# Patient Record
Sex: Male | Born: 1940
Health system: Southern US, Community
[De-identification: ages and names within clinical notes are randomized; demographics above are authoritative.]

## PROBLEM LIST (undated history)

## (undated) DIAGNOSIS — L570 Actinic keratosis: Secondary | ICD-10-CM

## (undated) DIAGNOSIS — D649 Anemia, unspecified: Secondary | ICD-10-CM

## (undated) DIAGNOSIS — C4491 Basal cell carcinoma of skin, unspecified: Secondary | ICD-10-CM

## (undated) DIAGNOSIS — K219 Gastro-esophageal reflux disease without esophagitis: Secondary | ICD-10-CM

## (undated) DIAGNOSIS — Z5189 Encounter for other specified aftercare: Secondary | ICD-10-CM

## (undated) DIAGNOSIS — Z972 Presence of dental prosthetic device (complete) (partial): Secondary | ICD-10-CM

## (undated) DIAGNOSIS — Z720 Tobacco use: Secondary | ICD-10-CM

## (undated) DIAGNOSIS — I739 Peripheral vascular disease, unspecified: Secondary | ICD-10-CM

## (undated) DIAGNOSIS — I10 Essential (primary) hypertension: Secondary | ICD-10-CM

## (undated) DIAGNOSIS — N529 Male erectile dysfunction, unspecified: Secondary | ICD-10-CM

## (undated) DIAGNOSIS — M1990 Unspecified osteoarthritis, unspecified site: Secondary | ICD-10-CM

## (undated) DIAGNOSIS — E512 Wernicke's encephalopathy: Secondary | ICD-10-CM

## (undated) DIAGNOSIS — D126 Benign neoplasm of colon, unspecified: Secondary | ICD-10-CM

## (undated) DIAGNOSIS — E785 Hyperlipidemia, unspecified: Secondary | ICD-10-CM

## (undated) DIAGNOSIS — C4401 Basal cell carcinoma of skin of lip: Secondary | ICD-10-CM

## (undated) HISTORY — DX: Male erectile dysfunction, unspecified: N52.9

## (undated) HISTORY — DX: Tobacco use: Z72.0

## (undated) HISTORY — PX: ANKLE ARTHROSCOPY W/ OPEN REPAIR: SHX1145

## (undated) HISTORY — DX: Hyperlipidemia, unspecified: E78.5

## (undated) HISTORY — DX: Actinic keratosis: L57.0

## (undated) HISTORY — DX: Benign neoplasm of colon, unspecified: D12.6

## (undated) HISTORY — PX: SKIN CANCER EXCISION: SHX779

## (undated) HISTORY — DX: Anemia, unspecified: D64.9

## (undated) HISTORY — DX: Essential (primary) hypertension: I10

## (undated) HISTORY — PX: TONSILLECTOMY: SUR1361

## (undated) HISTORY — DX: Unspecified osteoarthritis, unspecified site: M19.90

## (undated) HISTORY — DX: Basal cell carcinoma of skin of lip: C44.01

## (undated) HISTORY — DX: Basal cell carcinoma of skin, unspecified: C44.91

---

## 2003-12-28 DIAGNOSIS — D126 Benign neoplasm of colon, unspecified: Secondary | ICD-10-CM

## 2003-12-28 HISTORY — DX: Benign neoplasm of colon, unspecified: D12.6

## 2004-12-27 HISTORY — PX: COLONOSCOPY: SHX174

## 2005-09-06 ENCOUNTER — Encounter: Payer: Self-pay | Admitting: Family Medicine

## 2005-12-27 DIAGNOSIS — C4401 Basal cell carcinoma of skin of lip: Secondary | ICD-10-CM

## 2005-12-27 HISTORY — DX: Basal cell carcinoma of skin of lip: C44.01

## 2006-12-05 ENCOUNTER — Encounter: Payer: Self-pay | Admitting: Family Medicine

## 2007-07-17 ENCOUNTER — Encounter: Payer: Self-pay | Admitting: Family Medicine

## 2008-08-27 DIAGNOSIS — I10 Essential (primary) hypertension: Secondary | ICD-10-CM | POA: Insufficient documentation

## 2008-08-27 DIAGNOSIS — E785 Hyperlipidemia, unspecified: Secondary | ICD-10-CM

## 2008-08-27 DIAGNOSIS — E782 Mixed hyperlipidemia: Secondary | ICD-10-CM | POA: Insufficient documentation

## 2008-08-28 ENCOUNTER — Telehealth: Payer: Self-pay | Admitting: Family Medicine

## 2008-08-28 ENCOUNTER — Ambulatory Visit: Payer: Self-pay | Admitting: Family Medicine

## 2008-08-28 DIAGNOSIS — I6529 Occlusion and stenosis of unspecified carotid artery: Secondary | ICD-10-CM | POA: Insufficient documentation

## 2008-08-28 DIAGNOSIS — R7309 Other abnormal glucose: Secondary | ICD-10-CM | POA: Insufficient documentation

## 2008-08-28 DIAGNOSIS — L989 Disorder of the skin and subcutaneous tissue, unspecified: Secondary | ICD-10-CM | POA: Insufficient documentation

## 2008-08-28 DIAGNOSIS — G4700 Insomnia, unspecified: Secondary | ICD-10-CM

## 2008-08-29 LAB — CONVERTED CEMR LAB
CO2: 22 meq/L (ref 19–32)
Calcium: 8.9 mg/dL (ref 8.4–10.5)
Chloride: 108 meq/L (ref 96–112)
Cholesterol: 150 mg/dL (ref 0–200)
Creatinine, Ser: 0.8 mg/dL (ref 0.4–1.5)
Glucose, Bld: 123 mg/dL — ABNORMAL HIGH (ref 70–99)
HDL: 61.7 mg/dL (ref >39.0)
PSA: 0.98 ng/mL (ref 0.10–4.00)
Total CHOL/HDL Ratio: 2.4
Triglycerides: 54 mg/dL (ref 0–149)

## 2008-09-03 ENCOUNTER — Encounter: Payer: Self-pay | Admitting: Family Medicine

## 2008-09-03 DIAGNOSIS — C4491 Basal cell carcinoma of skin, unspecified: Secondary | ICD-10-CM

## 2008-09-03 DIAGNOSIS — C4490 Unspecified malignant neoplasm of skin, unspecified: Secondary | ICD-10-CM | POA: Insufficient documentation

## 2008-09-03 HISTORY — DX: Basal cell carcinoma of skin, unspecified: C44.91

## 2008-09-11 ENCOUNTER — Ambulatory Visit: Payer: Self-pay

## 2008-09-11 ENCOUNTER — Encounter: Payer: Self-pay | Admitting: Family Medicine

## 2008-11-06 ENCOUNTER — Encounter: Payer: Self-pay | Admitting: Family Medicine

## 2008-11-06 DIAGNOSIS — C4491 Basal cell carcinoma of skin, unspecified: Secondary | ICD-10-CM

## 2008-11-06 HISTORY — DX: Basal cell carcinoma of skin, unspecified: C44.91

## 2009-02-28 ENCOUNTER — Telehealth: Payer: Self-pay | Admitting: Family Medicine

## 2009-03-03 ENCOUNTER — Encounter: Payer: Self-pay | Admitting: Family Medicine

## 2009-03-25 ENCOUNTER — Ambulatory Visit: Payer: Self-pay

## 2009-03-25 ENCOUNTER — Encounter: Payer: Self-pay | Admitting: Family Medicine

## 2009-04-18 ENCOUNTER — Telehealth: Payer: Self-pay | Admitting: Family Medicine

## 2009-07-10 ENCOUNTER — Telehealth: Payer: Self-pay | Admitting: Family Medicine

## 2009-07-29 ENCOUNTER — Telehealth: Payer: Self-pay | Admitting: Family Medicine

## 2009-08-04 ENCOUNTER — Telehealth: Payer: Self-pay | Admitting: Family Medicine

## 2009-09-30 ENCOUNTER — Encounter: Payer: Self-pay | Admitting: Family Medicine

## 2009-09-30 ENCOUNTER — Ambulatory Visit: Payer: Self-pay

## 2009-10-08 ENCOUNTER — Ambulatory Visit: Payer: Self-pay | Admitting: Family Medicine

## 2009-10-13 LAB — CONVERTED CEMR LAB
Albumin: 4 g/dL (ref 3.5–5.2)
BUN: 20 mg/dL (ref 6–23)
Basophils Absolute: 0.1 10*3/uL (ref 0.0–0.1)
Calcium: 8.9 mg/dL (ref 8.4–10.5)
Eosinophils Relative: 8.9 % — ABNORMAL HIGH (ref 0.0–5.0)
Ferritin: 5.9 ng/mL — ABNORMAL LOW (ref 22.0–322.0)
Folate: 17 ng/mL
GFR calc non Af Amer: 88.91 mL/min (ref >60)
Glucose, Bld: 123 mg/dL — ABNORMAL HIGH (ref 70–99)
HDL: 57.9 mg/dL (ref >39.00)
Hemoglobin: 9.5 g/dL — ABNORMAL LOW (ref 13.0–17.0)
LDL Cholesterol: 84 mg/dL (ref 0–99)
Lymphocytes Relative: 23 % (ref 12.0–46.0)
Monocytes Relative: 13 % — ABNORMAL HIGH (ref 3.0–12.0)
PSA: 1.33 ng/mL (ref 0.10–4.00)
Platelets: 251 10*3/uL (ref 150.0–400.0)
RDW: 19.8 % — ABNORMAL HIGH (ref 11.5–14.6)
Sodium: 137 meq/L (ref 135–145)
VLDL: 11.8 mg/dL (ref 0.0–40.0)
Vitamin B-12: >1500 pg/mL — ABNORMAL HIGH (ref 211–911)
WBC: 5.8 10*3/uL (ref 4.5–10.5)

## 2009-10-22 ENCOUNTER — Encounter (INDEPENDENT_AMBULATORY_CARE_PROVIDER_SITE_OTHER): Payer: Self-pay | Admitting: *Deleted

## 2009-11-10 ENCOUNTER — Ambulatory Visit: Payer: Self-pay | Admitting: Family Medicine

## 2009-11-12 DIAGNOSIS — R195 Other fecal abnormalities: Secondary | ICD-10-CM

## 2009-11-12 DIAGNOSIS — D649 Anemia, unspecified: Secondary | ICD-10-CM

## 2009-12-12 ENCOUNTER — Ambulatory Visit: Payer: Self-pay | Admitting: Gastroenterology

## 2009-12-15 ENCOUNTER — Encounter: Payer: Self-pay | Admitting: Gastroenterology

## 2009-12-15 LAB — CONVERTED CEMR LAB
Basophils Absolute: 0 10*3/uL (ref 0.0–0.1)
Eosinophils Absolute: 0.3 10*3/uL (ref 0.0–0.7)
HCT: 46.3 % (ref 39.0–52.0)
Hemoglobin: 15.2 g/dL (ref 13.0–17.0)
IgA: 150 mg/dL (ref 68–378)
Lymphs Abs: 1.6 10*3/uL (ref 0.7–4.0)
MCHC: 32.8 g/dL (ref 30.0–36.0)
Monocytes Relative: 11.2 % (ref 3.0–12.0)
Neutro Abs: 5.5 10*3/uL (ref 1.4–7.7)
RDW: 28.1 % — ABNORMAL HIGH (ref 11.5–14.6)

## 2009-12-27 HISTORY — PX: UPPER GI ENDOSCOPY: SHX6162

## 2010-01-09 ENCOUNTER — Ambulatory Visit: Payer: Self-pay | Admitting: Gastroenterology

## 2010-01-13 ENCOUNTER — Encounter: Payer: Self-pay | Admitting: Gastroenterology

## 2010-01-20 ENCOUNTER — Telehealth: Payer: Self-pay | Admitting: Family Medicine

## 2010-01-29 ENCOUNTER — Telehealth: Payer: Self-pay | Admitting: Gastroenterology

## 2010-03-09 ENCOUNTER — Encounter (INDEPENDENT_AMBULATORY_CARE_PROVIDER_SITE_OTHER): Payer: Self-pay | Admitting: *Deleted

## 2010-04-21 ENCOUNTER — Ambulatory Visit: Payer: Self-pay | Admitting: Gastroenterology

## 2010-04-21 LAB — CONVERTED CEMR LAB
Basophils Relative: 0.8 % (ref 0.0–3.0)
Eosinophils Absolute: 0.3 10*3/uL (ref 0.0–0.7)
Eosinophils Relative: 4.4 % (ref 0.0–5.0)
Hemoglobin: 15.6 g/dL (ref 13.0–17.0)
Lymphocytes Relative: 21.6 % (ref 12.0–46.0)
MCHC: 34.7 g/dL (ref 30.0–36.0)
Neutro Abs: 4.4 10*3/uL (ref 1.4–7.7)
RBC: 4.74 M/uL (ref 4.22–5.81)

## 2010-04-21 LAB — HM COLONOSCOPY

## 2010-07-22 ENCOUNTER — Encounter: Payer: Self-pay | Admitting: Gastroenterology

## 2010-07-31 ENCOUNTER — Encounter: Payer: Self-pay | Admitting: Gastroenterology

## 2010-09-14 ENCOUNTER — Telehealth: Payer: Self-pay | Admitting: Family Medicine

## 2010-12-31 ENCOUNTER — Telehealth (INDEPENDENT_AMBULATORY_CARE_PROVIDER_SITE_OTHER): Payer: Self-pay | Admitting: *Deleted

## 2011-01-18 ENCOUNTER — Other Ambulatory Visit: Payer: Self-pay | Admitting: Family Medicine

## 2011-01-18 ENCOUNTER — Ambulatory Visit
Admission: RE | Admit: 2011-01-18 | Discharge: 2011-01-18 | Payer: Self-pay | Source: Home / Self Care | Attending: Family Medicine | Admitting: Family Medicine

## 2011-01-18 DIAGNOSIS — R5381 Other malaise: Secondary | ICD-10-CM | POA: Insufficient documentation

## 2011-01-18 DIAGNOSIS — R5383 Other fatigue: Secondary | ICD-10-CM

## 2011-01-18 LAB — CBC WITH DIFFERENTIAL/PLATELET
Basophils Relative: 0.6 % (ref 0.0–3.0)
Eosinophils Absolute: 0.2 10*3/uL (ref 0.0–0.7)
Eosinophils Relative: 3.6 % (ref 0.0–5.0)
Hemoglobin: 15 g/dL (ref 13.0–17.0)
Lymphocytes Relative: 22.7 % (ref 12.0–46.0)
MCHC: 34.4 g/dL (ref 30.0–36.0)
Monocytes Relative: 11.2 % (ref 3.0–12.0)
Neutro Abs: 3.7 10*3/uL (ref 1.4–7.7)
Neutrophils Relative %: 61.9 % (ref 43.0–77.0)
RBC: 4.6 Mil/uL (ref 4.22–5.81)
WBC: 6 10*3/uL (ref 4.5–10.5)

## 2011-01-18 LAB — BASIC METABOLIC PANEL
CO2: 26 mEq/L (ref 19–32)
Calcium: 9.3 mg/dL (ref 8.4–10.5)
GFR: 90.9 mL/min (ref >60.00)
Glucose, Bld: 129 mg/dL — ABNORMAL HIGH (ref 70–99)
Potassium: 4.8 mEq/L (ref 3.5–5.1)
Sodium: 137 mEq/L (ref 135–145)

## 2011-01-18 LAB — HEPATIC FUNCTION PANEL
AST: 68 U/L — ABNORMAL HIGH (ref 0–37)
Albumin: 4.1 g/dL (ref 3.5–5.2)
Alkaline Phosphatase: 60 U/L (ref 39–117)
Bilirubin, Direct: 0.1 mg/dL (ref 0.0–0.3)

## 2011-01-18 LAB — PSA: PSA: 1.1 ng/mL (ref 0.10–4.00)

## 2011-01-18 LAB — LDL CHOLESTEROL, DIRECT: Direct LDL: 132 mg/dL

## 2011-01-18 LAB — FERRITIN: Ferritin: 55.3 ng/mL (ref 22.0–322.0)

## 2011-01-18 LAB — LIPID PANEL
HDL: 62.6 mg/dL (ref >39.00)
VLDL: 16.2 mg/dL (ref 0.0–40.0)

## 2011-01-21 ENCOUNTER — Ambulatory Visit
Admission: RE | Admit: 2011-01-21 | Discharge: 2011-01-21 | Payer: Self-pay | Source: Home / Self Care | Attending: Family Medicine | Admitting: Family Medicine

## 2011-01-21 DIAGNOSIS — F1010 Alcohol abuse, uncomplicated: Secondary | ICD-10-CM | POA: Insufficient documentation

## 2011-01-21 DIAGNOSIS — IMO0002 Reserved for concepts with insufficient information to code with codable children: Secondary | ICD-10-CM | POA: Insufficient documentation

## 2011-01-21 DIAGNOSIS — R740 Nonspecific elevation of levels of transaminase and lactic acid dehydrogenase [LDH]: Secondary | ICD-10-CM

## 2011-01-26 NOTE — Procedures (Signed)
Summary: Recall Assessment/[facility]  Recall Assessment/[facility]   Imported By: Sherian Rein 08/04/2010 11:17:08  _____________________________________________________________________  External Attachment:    Type:   Image     Comment:   External Document

## 2011-01-26 NOTE — Letter (Signed)
Summary: Results Letter  [facility]  [address] [place], [place] [address]   Phone: [phone]  Fax: [phone]        January 13, 2010 MRN: [id number]    [facility] [address] [place], [place]  [address]    Dear Mr. Schatzman,   The biopsies taken during your recent upper endoscopy showed no signs of infection or cancer.  You should continue to follow the recommnedations that we discussed at the time of your procedure.   Please feel free to call if you have any further questions or concerns.       Sincerely,  Rachael Fee MD  This letter has been electronically signed by your physician.  Appended Document: Results Letter Letter mailed 1.18.11

## 2011-01-26 NOTE — Progress Notes (Signed)
Summary: Rx Zolpidem  Phone Note Refill Request Call back at [phone] Message from:  [facility] [facility] on January 20, 2010 2:33 PM  Refills Requested: Medication #1:  ZOLPIDEM TARTRATE 10 MG TABS 1 by mouth at bedtime as needed for sleep   Last Refilled: 10/06/2009 Received faxed refill request, please advise   Method Requested: Telephone to Pharmacy Initial call taken by: Linde Gillis CMA Duncan Dull),  January 20, 2010 2:34 PM    Prescriptions: ZOLPIDEM TARTRATE 10 MG TABS (ZOLPIDEM TARTRATE) 1 by mouth at bedtime as needed for sleep  #30 x 1   Entered and Authorized by:   Hannah Beat MD   Signed by:   Hannah Beat MD on 01/20/2010   Method used:   Telephoned to ...       [facility]  [place]ail)       [address]       [place], [place]  [address]       Ph: [phone] or [phone]       Fax: [phone]   RxID:   [id number]

## 2011-01-26 NOTE — Progress Notes (Signed)
Summary: Remus Loffler  Phone Note Refill Request Call back at [facility] Phone [phone] Message from:  [employer] on September 14, 2010 8:49 AM  Refills Requested: Medication #1:  ZOLPIDEM TARTRATE 10 MG TABS 1 by mouth at bedtime as needed for sleep   Supply Requested: 1 month   [facility] [place] [phone]   Method Requested: Telephone to Pharmacy Initial call taken by: Benny Lennert CMA Duncan Dull),  September 14, 2010 8:49 AM  Follow-up for Phone Call        Rx called to pharmacy Follow-up by: Benny Lennert CMA Duncan Dull),  September 14, 2010 3:25 PM    Prescriptions: ZOLPIDEM TARTRATE 10 MG TABS (ZOLPIDEM TARTRATE) 1 by mouth at bedtime as needed for sleep  #30 x 1   Entered and Authorized by:   Hannah Beat MD   Signed by:   Hannah Beat MD on 09/14/2010   Method used:   Telephoned to ...       [facility]  [place]ail[facility]       [address]       [place], [place]  [address]       Ph: [phone] or [phone]       Fax: [phone]   RxID:   [id number]

## 2011-01-26 NOTE — Progress Notes (Signed)
Summary: Triage  Phone Note Call from Patient Call back at Home Phone [phone]   Caller: Patient Call For: Dr. Christella Hartigan Reason for Call: Talk to Nurse Summary of Call: Pt is on Prilosec and has been taking it for 19 days. The box said to only take it for 14 days. Medication seems to be working. Will this be an issue? Initial call taken by: Karna Christmas,  January 29, 2010 10:50 AM  Follow-up for Phone Call        I explained to the pt that Dr Christella Hartigan wanted him to remain on the Prilosec and it was ok for him to do so.  He states it is really h elping. he schedule a f/u in a few weeks he is going out of town in March and will be gone three weeks. Follow-up by: Chales Abrahams CMA (AAMA),  January 29, 2010 11:00 AM

## 2011-01-26 NOTE — Assessment & Plan Note (Signed)
  Review of gastrointestinal problems: 1. Likely NSAID related gastritis, EGD January 2011 by Dr. Christella Hartigan, biopsies were negative for H. pylori; duodenal biopsies neg for sprue changes.   2. tubular adenoma, colonoscopy in [place]   History of Present Illness Visit Type: Follow-up Visit Primary GI MD: Rob Bunting MD Primary Provider: Hannah Beat, MD Requesting Provider: n/a Chief Complaint: F/u from EGD History of Present Illness:     very pleasant 70 year old man whom I last saw January, 2011.  who has had no problems since then.  No black stools, no hematemesis.  No fatigue.  he stopped iron 3 months ago and has been on PPI daily.  No NSAIDs.             Current Medications (verified): 1)  Simvastatin 10 Mg Tabs (Simvastatin) .... Take One Tablet Daily 2)  Moexipril Hcl 15 Mg Tabs (Moexipril Hcl) .... Take One Tablet Daily 3)  Zolpidem Tartrate 10 Mg Tabs (Zolpidem Tartrate) .Marland Kitchen.. 1 By Mouth At Bedtime As Needed For Sleep 4)  Bupropion Hcl 150 Mg Xr24h-Tab (Bupropion Hcl) .Marland Kitchen.. 1 By Mouth Daily 5)  Prilosec Otc 20 Mg  Tbec (Omeprazole Magnesium) .Marland Kitchen.. 1 Each Day 30 Minutes Before Meal  Allergies (verified): No Known Drug Allergies  Vital Signs:  Patient profile:   70 year old male Height:      70 inches Weight:      190 pounds BMI:     27.36 BSA:     2.04 Pulse rate:   76 / minute Pulse rhythm:   regular BP sitting:   142 / 86  (left arm) Cuff size:   regular  Vitals Entered By: Ok Anis CMA (April 21, 2010 8:30 AM)  Physical Exam  Additional Exam:  Constitutional: generally well appearing Psychiatric: alert and oriented times 3 Abdomen: soft, non-tender, non-distended, normal bowel sounds    Impression & Recommendations:  Problem # 1:  previous gastritis he has had no recurrent dark stools. He stopped NSAIDs completely and although he was not taking very much I suspect they may have been contributing to his gastric irritation, gastritis. He will  continue taking over-the-counter Prilosec once daily. He'll continue to avoid NSAIDs. He had a CBC just prior to this visit and if he is not anemic but I don't think any further workup needs to be performed.  Problem # 2:  personal history of precancerous colon polyps he had a single tubular adenoma removed in [place], 2006. We will set him up for recall colonoscopy September, 2011.  Patient Instructions: 1)  Recall colonoscopy in September 2011 for previous colon polyps. 2)  Continue once daily PPI (prilosec, OTC). 3)  Dr. Christella Hartigan' office will contact you with blood test results from this AM. 4)  A copy of this information will be sent to Dr. Dallas Schimke. 5)  The medication list was reviewed and reconciled.  All changed / newly prescribed medications were explained.  A complete medication list was provided to the patient / caregiver.  Appended Document: recall colon    Clinical Lists Changes  Observations: Added new observation of COLONNXTDUE: 08/2010 (04/21/2010 8:51)

## 2011-01-26 NOTE — Procedures (Signed)
Summary: Upper Endoscopy  Patient: Devin Garza Note: All result statuses are Final unless otherwise noted.  Tests: (1) Upper Endoscopy (EGD)   EGD Upper Endoscopy       DONE      [facility]     [address]     [place], [place]  [address]           ENDOSCOPY PROCEDURE REPORT           PATIENT:  Devin Garza  MR#:  [id number]     BIRTHDATE:  09-15-1941, 68 yrs. old  GENDER:  male           ENDOSCOPIST:  Rachael Fee, MD     Referred by:  Elpidio Galea. Copland, M.D.           PROCEDURE DATE:  01/09/2010     PROCEDURE:  EGD with biopsy     ASA CLASS:  Class II     INDICATIONS:  heme positive anemia, intermittent black stools,     colonoscopy in 2006 (elsewhere) found 2 small polyps (one was a     TA), freqent NSAID taker           MEDICATIONS:  Fentanyl 50 mcg IV, Versed 7 mg IV     TOPICAL ANESTHETIC:  none           DESCRIPTION OF PROCEDURE:   After the risks benefits and     alternatives of the procedure were thoroughly explained, informed     consent was obtained.  The [facility] [id number] endoscope was     introduced through the mouth and advanced to the second portion of     the duodenum, without limitations.  The instrument was slowly     withdrawn as the mucosa was fully examined.     <<PROCEDUREIMAGES>>           There was mild to moderate, non-specific gastritis. This was     biopsied to check for H. pylori. There was mild duodenitis     (scalloping?), this was biopsied to check for celiac sprue given     iron def anemia even though serology was negative. There was mild     reflux esophagitis (see image1, image2, image3, image4, image5,     and image6).  A hiatal hernia was found. This was small.     Otherwise the examination was normal.    Retroflexed views revealed     no abnormalities.    The scope was then withdrawn from the patient     and the procedure completed.           COMPLICATIONS:  None           ENDOSCOPIC IMPRESSION:     1) Mild to  moderate gastritis (probably NSAID related but     biopsies were taken to check for H. pylori).  Mild duodenitis,     biopsied.  Mild reflux esophagitis.     2) Small hiatal hernia     3) Otherwise normal examination           RECOMMENDATIONS:     He can stop daily iron supplements which he started 2-3 months     ago after he was found to have Hb in 9s (Hb last month was 15).     He should continue to completely AVOID NSAID type medicines.     He should continue daily anti-acid medicine (prilosec OTC is     reasonable, 20-30 min  prior to breakfast meal daily).     My office will arrange for return visit in 7-8 weeks and he will     have CBC the day prior.           ______________________________     Rachael Fee, MD           n.     eSIGNED:   Rachael Fee at 01/09/2010 10:47 AM           [name], [id number]  Note: An exclamation mark (!) indicates a result that was not dispersed into the flowsheet. Document Creation Date: 01/09/2010 10:47 AM _______________________________________________________________________  (1) Order result status: Final Collection or observation date-time: 01/09/2010 10:38 Requested date-time:  Receipt date-time:  Reported date-time:  Referring Physician:   Ordering Physician: Rob Bunting [phone]) Specimen Source:  Source: Launa Grill Order Number: [id number] Lab site:

## 2011-01-26 NOTE — Letter (Signed)
Summary: Colonoscopy Letter  [facility]  [address] [place], [place] [address]   Phone: [phone]  Fax: [phone]      July 31, 2010 MRN: [id number]   [facility] [address] [place], [place]  [address]   Dear Mr. Yaun,   According to your medical record, it is time for you to schedule a Colonoscopy. The American Cancer Society recommends this procedure as a method to detect early colon cancer. Patients with a family history of colon cancer, or a personal history of colon polyps or inflammatory bowel disease are at increased risk.  This letter has been generated based on the recommendations made at the time of your procedure. If you feel that in your particular situation this may no longer apply, please contact our office.  Please call our office at [phone] to schedule this appointment or to update your records at your earliest convenience.  Thank you for cooperating with [place] to provide you with the very best care possible.   Sincerely,  Rachael Fee, M.D.  [facility] [phone]

## 2011-01-26 NOTE — Letter (Signed)
Summary: Appt Reminder 2  [facility]  [address] [place], [place] [address]   Phone: [phone]  Fax: [phone]        March 09, 2010 MRN: [id number]    [facility] [address] [place], [place]  [address]    Dear Devin Garza,   You have a return appointment with Dr. Christella Hartigan on 03/30/10 at 3:45pm. Please go to our basement lab 1 hour prior to your visit.  Please remember to bring a complete list of the medicines you are taking, your insurance card and your co-pay.  If you have to cancel or reschedule this appointment, please call before 5:00 pm the evening before to avoid a cancellation fee.  If you have any questions or concerns, please call [phone].    Sincerely,    Chales Abrahams CMA (AAMA)  Appended Document: Appt Reminder 2 letter mailed

## 2011-01-28 NOTE — Assessment & Plan Note (Addendum)
Summary: CPX /JRR   Vital Signs:  Patient profile:   70 year old male Height:      70 inches Weight:      197.75 pounds BMI:     28.48 Temp:     99.3 degrees F oral Pulse rate:   76 / minute Pulse rhythm:   regular BP sitting:   160 / 88  (left arm) Cuff size:   regular  Vitals Entered By: Benny Lennert CMA Duncan Dull) (January 21, 2011 2:39 PM)  History of Present Illness: Chief complaint annual wellness visit  Pneumovax done.  Colonoscopy: The patient understands that he is due for colonoscopy, he has had a recall letter from gastroenterology, he's been to do this when he comes in back from town.Marland Kitchen He is got an upcoming trip to [place], and will be back in March. Zostavaxcold the patient understands this is the recommendation from the [facility], he will check on the pavement based on his insurance.  Elevated LFT's:transaminases are elevated, with a ratio of higher AST compared area ALT. Patient does drink alcohol minimally 3 drinks a day, sometimes upwards of 4-6 drinks a day. Over the last several weeks, patient has  diminished this intake.  Elevated blood sugar / DM 2: fasting blood sugar of 129, previously no diagnosis of diabetes.  Again, the patient has been drinking an excessive amount of alcohol over the last several months, and  additionally he has been eating quite poorly and drinking at least a liter of Sprite a day.  EAV:[id number].  Repeated in the office by me and again 155/90. He reports compliance with his medications. Alcohol impact noted and discussed.  Carotid artery stenosis - needs f/u scan  Clinical Review Panels:  Prevention   Last Colonoscopy:  Adenomatous Polyp, Hyperplastic Polyp (09/06/2005)   Last PSA:  1.10 (01/18/2011)  Immunizations   Last Tetanus Booster:  given (12/28/2003)   Last Pneumovax:  Pneumovax (Medicare) (01/21/2011)  Lipid Management   Cholesterol:  203 (01/18/2011)   LDL (bad choesterol):  84 (10/08/2009)   HDL (good cholesterol):  62.60  (01/18/2011)  Diabetes Management   Creatinine:  0.9 (01/18/2011)   Last Pneumovax:  Pneumovax (Medicare) (01/21/2011)  CBC   WBC:  6.0 (01/18/2011)   RBC:  4.60 (01/18/2011)   Hgb:  15.0 (01/18/2011)   Hct:  43.7 (01/18/2011)   Platelets:  203.0 (01/18/2011)   MCV  94.9 (01/18/2011)   MCHC  34.4 (01/18/2011)   RDW  14.1 (01/18/2011)   PMN:  61.9 (01/18/2011)   Lymphs:  22.7 (01/18/2011)   Monos:  11.2 (01/18/2011)   Eosinophils:  3.6 (01/18/2011)   Basophil:  0.6 (01/18/2011)  Complete Metabolic Panel   Glucose:  129 (01/18/2011)   Sodium:  137 (01/18/2011)   Potassium:  4.8 (01/18/2011)   Chloride:  101 (01/18/2011)   CO2:  26 (01/18/2011)   BUN:  18 (01/18/2011)   Creatinine:  0.9 (01/18/2011)   Albumin:  4.1 (01/18/2011)   Total Protein:  6.9 (01/18/2011)   Calcium:  9.3 (01/18/2011)   Total Bili:  0.7 (01/18/2011)   Alk Phos:  60 (01/18/2011)   SGPT (ALT):  63 (01/18/2011)   SGOT (AST):  68 (01/18/2011)   Allergies (verified): No Known Drug Allergies  Past History:  Past medical, surgical, family and social histories (including risk factors) reviewed, and no changes noted (except as noted below).  Past Medical History: Hypertension Carotid Vascular disease, 50-79% blockage on R, 16-49% stenosis on L - on u/s, saw Vascular  Surgery Erectile Dysfunction Tobacco Abuse, 30 years Hyperlipidemia Osteoarthritis, mild Basal cell carcinoma, lip, 7/07   Past Surgical History: Reviewed history from 12/12/2009 and no changes required. h/o L ankle fracture - screws basal cell removal - lip   Family History: Reviewed history from 12/12/2009 and no changes required. Mother - 51, cancer lung Father - 32's, MI, CAD, voicebox removed secondary to smoking - throat CA Brother  Sister - Ovarian CA Children adopted    Social History: Reviewed history from 12/12/2009 and no changes required. Current Smoker - 40 year pack year Retired Transport planner Originally from  [place] alone 2 children 12 yo twins drinks 3 alcoholic beverages per day  Review of Systems  General: Denies fever, chills, sweats, anorexia, fatigue, weakness, malaise Eyes: Denies blurring, vision loss ENT: Denies earache, nasal congestion, nosebleeds, sore throat, and hoarseness.  Cardiovascular: Denies chest pains, palpitations, syncope, dyspnea on exertion,  Respiratory: Denies cough, dyspnea at rest, excessive sputum,wheeezing GI: Denies nausea, vomiting, diarrhea, constipation, change in bowel habits, abdominal pain, melena, hematochezia GU: Denies dysuria, hematuria, discharge, urinary frequency, urinary hesitancy, nocturia, incontinence, genital sores Musculoskeletal: Denies back pain, joint pain Derm: Denies rash, itching Neuro: Denies  paresthesias, frequent falls, frequent headaches, and difficulty walking.  Psych: Denies depression, anxiety Endocrine: Denies cold intolerance, heat intolerance, polydipsia, polyphagia, polyuria, and unusual weight change.  Heme: Denies enlarged lymph nodes Allergy: No hayfever   Otherwise, the pertinent positives and negatives are listed above and in the HPI, otherwise a full review of systems has been reviewed and is negative unless noted positive.   Physical Exam  General:  Well-developed,well-nourished,in no acute distress; alert,appropriate and cooperative throughout examination Head:  Normocephalic and atraumatic without obvious abnormalities. No apparent alopecia or balding. Eyes:  pupils equal, pupils round, pupils reactive to light, pupils react to accomodation, corneas and lenses clear, and no injection.   Ears:  External ear exam shows no significant lesions or deformities.  Otoscopic examination reveals clear canals, tympanic membranes are intact bilaterally without bulging, retraction, inflammation or discharge. Hearing is grossly normal bilaterally. Nose:  External nasal examination shows no deformity or inflammation. Nasal  mucosa are pink and moist without lesions or exudates. Mouth:  pharynx pink and moist.   Neck:  No deformities, masses, or tenderness noted. Chest Wall:  No deformities, masses, tenderness or gynecomastia noted. Lungs:  Normal respiratory effort, chest expands symmetrically. Lungs are clear to auscultation, no crackles or wheezes. Heart:  Normal rate and regular rhythm. S1 and S2 normal without gallop, murmur, click, rub or other extra sounds. Abdomen:  Bowel sounds positive,abdomen soft and non-tender without masses, organomegaly or hernias noted. Rectal:  No external abnormalities noted. Normal sphincter tone. No rectal masses or tenderness. Genitalia:  Testes bilaterally descended without nodularity, tenderness or masses. No scrotal masses or lesions. No penis lesions or urethral discharge. Prostate:  Prostate gland firm and smooth, no enlargement, nodularity, tenderness, mass, asymmetry or induration. Msk:  normal ROM and no crepitation.   Extremities:  No clubbing, cyanosis, edema, or deformity noted with normal full range of motion of all joints.   Neurologic:  alert & oriented X3 and gait normal.   Skin:  Intact without suspicious lesions or rashes Cervical Nodes:  No lymphadenopathy noted Psych:  Cognition and judgment appear intact. Alert and cooperative with normal attention span and concentration. No apparent delusions, illusions, hallucinations   Impression & Recommendations:  Problem # 1:  HEALTH MAINTENANCE EXAM (ICD-V70.0)  The patient's preventative maintenance and recommended screening  tests for an annual wellness exam were reviewed in full today. Brought up to date unless services declined.  Counselled on the importance of diet, exercise, and its role in overall health and mortality. The patient's FH and SH was reviewed, including their home life, tobacco status, and drug and alcohol status.   check Zostavax will set up colon in march (new GI MD)  Orders: Medicare  -1st Annual Wellness Visit [phone])  Problem # 2:  HYPERGLYCEMIA (ICD-790.29) Assessment: New BS 129, will repeat in a couple of months and check a1c to r/u DM 2  Problem # 3:  TRANSAMINASES, SERUM, ELEVATED (ICD-790.4) long discussion out this implication, which I think is coming from his longstanding alcohol abuse, >3 drinks a day. Long counselling session about the need to decrease, and this was likely impacting his liver, HTN, blood sugar.  Problem # 4:  ALCOHOL ABUSE (ICD-305.00)  Problem # 5:  CAROTID ARTERY STENOSIS, BILATERAL (ICD-433.10) rescan  Orders: Cardiology Referral (Cardiology)  Problem # 6:  HYPERTENSION (ICD-401.9) Assessment: Deteriorated unstable, change to the novartis product diovan that he can get for free  His updated medication list for this problem includes:    Diovan 80 Mg Tabs (Valsartan) .Marland Kitchen... 1 by mouth    Moexipril Hcl 15 Mg Tabs (Moexipril hcl) .Marland Kitchen... 1 by mouth daily  Problem # 7:  HYPERLIPIDEMIA (ICD-272.4) +/- at goal depending on how DM labs return at f/u  His updated medication list for this problem includes:    Simvastatin 10 Mg Tabs (Simvastatin) .Marland Kitchen... Take one tablet daily  Complete Medication List: 1)  Simvastatin 10 Mg Tabs (Simvastatin) .... Take one tablet daily 2)  Diovan 80 Mg Tabs (Valsartan) .Marland Kitchen.. 1 by mouth 3)  Zolpidem Tartrate 10 Mg Tabs (Zolpidem tartrate) .Marland Kitchen.. 1 by mouth at bedtime as needed for sleep 4)  Bupropion Hcl 150 Mg Xr24h-tab (Bupropion hcl) .Marland Kitchen.. 1 by mouth daily 5)  Prilosec Otc 20 Mg Tbec (Omeprazole magnesium) .Marland Kitchen.. 1 each day 30 minutes before meal 6)  Moexipril Hcl 15 Mg Tabs (Moexipril hcl) .Marland Kitchen.. 1 by mouth daily  Other Orders: Pneumococcal Vaccine (60454) Admin 1st Vaccine (09811)  Patient Instructions: 1)  f/u after March 15 2)  BMP prior to visit, ICD-9: 250.00 3)  Hepatic Panel prior to visit ICD-9: v58.69 4)  HgBA1c prior to visit  ICD-9: 250.00 5)  Referral Appointment Information 6)  Day/Date: 7)   Time: 8)  Place/MD: 9)  Address: 10)  Phone/Fax: 11)  Patient given appointment information. Information/Orders faxed/mailed.   Prescriptions: MOEXIPRIL HCL 15 MG TABS (MOEXIPRIL HCL) 1 by mouth daily  #30 x 0   Entered and Authorized by:   Hannah Beat MD   Signed by:   Hannah Beat MD on 01/21/2011   Method used:   Print then Give to Patient   RxID:   [id number] SIMVASTATIN 10 MG TABS (SIMVASTATIN) take one tablet daily  #90 x 3   Entered and Authorized by:   Hannah Beat MD   Signed by:   Hannah Beat MD on 01/21/2011   Method used:   Print then Give to Patient   RxID:   [id number] DIOVAN 80 MG TABS (VALSARTAN) 1 by mouth daily  #30 x 0   Entered and Authorized by:   Hannah Beat MD   Signed by:   Hannah Beat MD on 01/21/2011   Method used:   Print then Give to Patient   RxID:   [id number] DIOVAN 80 MG TABS (VALSARTAN) 1 by  mouth  #90 x 3   Entered and Authorized by:   Hannah Beat MD   Signed by:   Hannah Beat MD on 01/21/2011   Method used:   Print then Give to Patient   RxID:   [id number]    Orders Added: 1)  Pneumococcal Vaccine [14782] 2)  Admin 1st Vaccine [90471] 3)  Cardiology Referral [Cardiology] 4)  Medicare -1st Annual Wellness Visit [G0438] 5)  Est. Patient Level IV [95621]   Immunizations Administered:  Pneumonia Vaccine:    Vaccine Type: Pneumovax (Medicare)    Site: left deltoid    Mfr: Merck    Dose: 0.5 ml    Route: [place]    Given by: Benny Lennert CMA (AAMA)    Exp. Date: 05/20/2012    Lot #: [id number]    VIS given: 12/01/09 version given January 21, 2011.   Immunizations Administered:  Pneumonia Vaccine:    Vaccine Type: Pneumovax (Medicare)    Site: left deltoid    Mfr: Merck    Dose: 0.5 ml    Route:     Given by: Benny Lennert CMA (AAMA)    Exp. Date: 05/20/2012    Lot #: [id number]    VIS given: 12/01/09 version given January 21, 2011.  Current Allergies (reviewed today): No known  allergies   Prevention & Chronic Care Immunizations   Influenza vaccine: Not documented    Tetanus booster: 12/28/2003: given   Tetanus booster due: 12/27/2013    Pneumococcal vaccine: Pneumovax (Medicare)  (01/21/2011)    H. zoster vaccine: Not documented   H. zoster vaccine deferral: Refused  (01/21/2011)  Colorectal Screening   Hemoccult: Not documented    Colonoscopy: Adenomatous Polyp, Hyperplastic Polyp  (09/06/2005)   Colonoscopy due: 08/2010  Other Screening   PSA: 1.10  (01/18/2011)   PSA due due: 08/28/2009   Smoking status: current  (10/08/2009)  Lipids   Total Cholesterol: 203  (01/18/2011)   LDL: 84  (10/08/2009)   LDL Direct: 132.0  (01/18/2011)   HDL: 62.60  (01/18/2011)   Triglycerides: 81.0  (01/18/2011)    SGOT (AST): 68  (01/18/2011)   SGPT (ALT): 63  (01/18/2011)   Alkaline phosphatase: 60  (01/18/2011)   Total bilirubin: 0.7  (01/18/2011)  Hypertension   Last Blood Pressure: 160 / 88  (01/21/2011)   Serum creatinine: 0.9  (01/18/2011)   Serum potassium 4.8  (01/18/2011)  Self-Management Support :    Hypertension self-management support: Not documented    Lipid self-management support: Not documented    Nursing Instructions: Give Pneumovax today       Appended Document: CPX /JRR I have personally reviewed the Medicare Annual Wellness questionnaire and have noted 1.   The patient's medical and social history 2.   Their use of alcohol, tobacco or illicit drugs 3.   Their current medications and supplements 4.   The patient's functional ability including ADL's, fall risks, home safety risks and hearing or visual             impairment. 5.   Diet and physical activities 6.   Evidence for depression or mood disorders  The patients weight, height, BMI and visual acuity have been recorded in the chart I have made referrals, counseling and provided education to the patient based review of the above and I have provided the pt with a  written personalized care plan for preventive services.  I have provided you with a copy of your personalized plan for preventive services. Please take  the time to review along with your updated medication list.   All done at time of visit, but not completely documented.

## 2011-01-28 NOTE — Progress Notes (Signed)
----   Converted from flag ---- ---- 12/30/2010 1:27 PM, Hannah Beat MD wrote: Prephysical Labs, several days before, fasting BMP, HFP, FLP, CBC with diff, TSH, Ferritin, PSA: 272.4, v58.69, v76.44, 780.79  ---- 12/30/2010 1:21 PM, Liane Comber CMA (AAMA) wrote: Lab orders please! Good Morning! This pt is scheduled for cpx labs Monday, which labs to draw and dx codes to use? Thanks Tasha ------------------------------

## 2011-01-29 ENCOUNTER — Encounter: Payer: Self-pay | Admitting: Family Medicine

## 2011-01-29 ENCOUNTER — Telehealth: Payer: Self-pay | Admitting: Family Medicine

## 2011-02-03 NOTE — Progress Notes (Signed)
Summary: clarification needed for diovan  Phone Note From Pharmacy   Caller: [facility] Summary of Call: [facility] has faxed a form stating diovan script is incomplete, they are asking for more complete directions.  Form is on your desk. Initial call taken by: Lowella Petties CMA, AAMA,  January 29, 2011 10:25 AM  Follow-up for Phone Call        Completed.  Follow-up by: Kerby Nora MD,  January 29, 2011 11:12 AM

## 2011-02-04 ENCOUNTER — Encounter: Payer: Self-pay | Admitting: Family Medicine

## 2011-02-11 NOTE — Medication Information (Signed)
Summary: [facility] Rx Clarification Request  [facility] Rx Clarification Request   Imported By: Kassie Mends 02/05/2011 09:25:59  _____________________________________________________________________  External Attachment:    Type:   Image     Comment:   External Document

## 2011-03-29 ENCOUNTER — Other Ambulatory Visit: Payer: Self-pay

## 2011-03-31 ENCOUNTER — Ambulatory Visit: Payer: Self-pay | Admitting: Family Medicine

## 2011-04-12 ENCOUNTER — Other Ambulatory Visit: Payer: Self-pay | Admitting: *Deleted

## 2011-04-12 ENCOUNTER — Other Ambulatory Visit: Payer: Self-pay | Admitting: Cardiology

## 2011-04-12 DIAGNOSIS — I6529 Occlusion and stenosis of unspecified carotid artery: Secondary | ICD-10-CM

## 2011-04-12 DIAGNOSIS — Z79899 Other long term (current) drug therapy: Secondary | ICD-10-CM

## 2011-04-13 ENCOUNTER — Encounter (INDEPENDENT_AMBULATORY_CARE_PROVIDER_SITE_OTHER): Payer: Medicare Other | Admitting: *Deleted

## 2011-04-13 DIAGNOSIS — I6529 Occlusion and stenosis of unspecified carotid artery: Secondary | ICD-10-CM

## 2011-04-14 ENCOUNTER — Encounter: Payer: Self-pay | Admitting: Family Medicine

## 2011-04-19 ENCOUNTER — Other Ambulatory Visit (INDEPENDENT_AMBULATORY_CARE_PROVIDER_SITE_OTHER): Payer: Medicare Other

## 2011-04-19 DIAGNOSIS — E119 Type 2 diabetes mellitus without complications: Secondary | ICD-10-CM

## 2011-04-19 DIAGNOSIS — Z79899 Other long term (current) drug therapy: Secondary | ICD-10-CM

## 2011-04-19 LAB — BASIC METABOLIC PANEL
CO2: 27 mEq/L (ref 19–32)
Calcium: 9 mg/dL (ref 8.4–10.5)
Creatinine, Ser: 1 mg/dL (ref 0.4–1.5)

## 2011-04-19 LAB — HEPATIC FUNCTION PANEL
ALT: 46 U/L (ref 0–53)
Bilirubin, Direct: 0 mg/dL (ref 0.0–0.3)
Total Bilirubin: 0.4 mg/dL (ref 0.3–1.2)

## 2011-04-19 LAB — HEMOGLOBIN A1C: Hgb A1c MFr Bld: 5.9 % (ref 4.6–6.5)

## 2011-04-21 ENCOUNTER — Encounter: Payer: Self-pay | Admitting: Family Medicine

## 2011-04-21 ENCOUNTER — Ambulatory Visit (INDEPENDENT_AMBULATORY_CARE_PROVIDER_SITE_OTHER): Payer: Medicare Other | Admitting: Family Medicine

## 2011-04-21 DIAGNOSIS — E785 Hyperlipidemia, unspecified: Secondary | ICD-10-CM

## 2011-04-21 DIAGNOSIS — G4700 Insomnia, unspecified: Secondary | ICD-10-CM

## 2011-04-21 DIAGNOSIS — Z125 Encounter for screening for malignant neoplasm of prostate: Secondary | ICD-10-CM

## 2011-04-21 DIAGNOSIS — I10 Essential (primary) hypertension: Secondary | ICD-10-CM

## 2011-04-21 DIAGNOSIS — R7303 Prediabetes: Secondary | ICD-10-CM | POA: Insufficient documentation

## 2011-04-21 DIAGNOSIS — R7309 Other abnormal glucose: Secondary | ICD-10-CM

## 2011-04-21 MED ORDER — ZOLPIDEM TARTRATE 10 MG PO TABS: 10.0000 mg | ORAL_TABLET | Freq: Every evening | ORAL | Status: DC | PRN

## 2011-04-21 NOTE — Progress Notes (Signed)
69 year old male:  HTN: Tolerating all medications without side effects Reports normal BP at home No CP, no sob. No HA.  BP Readings from Last 3 Encounters:  04/21/11 140/84  01/21/11 160/88  04/21/10 142/86   Elevated BS:   Chemistry      Component Value Date/Time   NA 139 04/19/2011 0904   K 4.4 04/19/2011 0904   CL 98 04/19/2011 0904   CO2 27 04/19/2011 0904   BUN 21 04/19/2011 0904   CREATININE 1.0 04/19/2011 0904      Component Value Date/Time   CALCIUM 9.0 04/19/2011 0904   ALKPHOS 48 04/19/2011 0904   AST 43* 04/19/2011 0904   ALT 46 04/19/2011 0904   BILITOT 0.4 04/19/2011 0904     Lab Results  Component Value Date   HGBA1C 5.9 04/19/2011   Elevated FBS, but a1c at 5.9 Has been this way 5 years or more  LFT's improving. ETOH down to < 1 a day  Cut down on ice cream. Pushed up some exercise.   Insomnia, rare ambien use  The PMH, PSH, Social History, Family History, Medications, and allergies have been reviewed in [facility], and have been updated if relevant.  ROS: GEN: No acute illnesses, no fevers, chills. GI: No n/v/d, eating normally Pulm: No SOB Interactive and getting along well at home.  Otherwise, ROS is as per the HPI.  GEN: WDWN, NAD, Non-toxic, A & O x 3 HEENT: Atraumatic, Normocephalic. Neck supple. No masses, No LAD. Ears and Nose: No external deformity. CV: RRR, No M/G/R. No JVD. No thrill. No extra heart sounds. PULM: CTA B, no wheezes, crackles, rhonchi. No retractions. No resp. distress. No accessory muscle use. EXTR: No c/c/e NEURO Normal gait.  PSYCH: Normally interactive. Conversant. Not depressed or anxious appearing.  Calm demeanor.

## 2011-04-21 NOTE — Patient Instructions (Addendum)
CHECK BLOOD PRESSURE TWICE A WEEK  If after a month, readings are > 130/80 on average, then call our office, and we should increase your blood pressure medicine.  Recheck in 6 months

## 2012-01-12 DIAGNOSIS — H04129 Dry eye syndrome of unspecified lacrimal gland: Secondary | ICD-10-CM | POA: Diagnosis not present

## 2012-02-15 ENCOUNTER — Other Ambulatory Visit: Payer: Self-pay | Admitting: Family Medicine

## 2012-04-05 ENCOUNTER — Other Ambulatory Visit: Payer: Self-pay | Admitting: Family Medicine

## 2012-05-03 ENCOUNTER — Other Ambulatory Visit: Payer: Self-pay | Admitting: Family Medicine

## 2012-07-06 ENCOUNTER — Other Ambulatory Visit: Payer: Self-pay | Admitting: *Deleted

## 2012-07-06 MED ORDER — ZOLPIDEM TARTRATE 10 MG PO TABS: 10.0000 mg | ORAL_TABLET | Freq: Every evening | ORAL | Status: DC | PRN

## 2012-07-06 NOTE — Telephone Encounter (Signed)
Ok, 30, 5 refills. 

## 2012-07-06 NOTE — Telephone Encounter (Signed)
rx called to pharmacy 

## 2012-07-10 DIAGNOSIS — L570 Actinic keratosis: Secondary | ICD-10-CM | POA: Diagnosis not present

## 2012-07-10 DIAGNOSIS — L578 Other skin changes due to chronic exposure to nonionizing radiation: Secondary | ICD-10-CM | POA: Diagnosis not present

## 2012-07-10 DIAGNOSIS — L821 Other seborrheic keratosis: Secondary | ICD-10-CM | POA: Diagnosis not present

## 2012-07-14 ENCOUNTER — Telehealth: Payer: Self-pay | Admitting: Family Medicine

## 2012-07-14 DIAGNOSIS — R7303 Prediabetes: Secondary | ICD-10-CM

## 2012-07-14 DIAGNOSIS — E785 Hyperlipidemia, unspecified: Secondary | ICD-10-CM

## 2012-07-14 DIAGNOSIS — I10 Essential (primary) hypertension: Secondary | ICD-10-CM

## 2012-07-14 NOTE — Telephone Encounter (Signed)
Message copied by Excell Seltzer on Fri Jul 14, 2012 10:21 AM ------      Message from: Alvina Chou      Created: Fri Jul 14, 2012  8:12 AM      Regarding: Labs for Tuesday, 7.23.13       Patient is scheduled for CPX labs, please order future labs, Thanks , Camelia Eng

## 2012-07-19 ENCOUNTER — Other Ambulatory Visit (INDEPENDENT_AMBULATORY_CARE_PROVIDER_SITE_OTHER): Payer: Medicare Other

## 2012-07-19 DIAGNOSIS — E785 Hyperlipidemia, unspecified: Secondary | ICD-10-CM

## 2012-07-19 DIAGNOSIS — R7309 Other abnormal glucose: Secondary | ICD-10-CM

## 2012-07-19 DIAGNOSIS — R7303 Prediabetes: Secondary | ICD-10-CM

## 2012-07-19 DIAGNOSIS — R7401 Elevation of levels of liver transaminase levels: Secondary | ICD-10-CM

## 2012-07-19 LAB — COMPREHENSIVE METABOLIC PANEL
ALT: 69 U/L — ABNORMAL HIGH (ref 0–53)
AST: 65 U/L — ABNORMAL HIGH (ref 0–37)
Alkaline Phosphatase: 43 U/L (ref 39–117)
BUN: 26 mg/dL — ABNORMAL HIGH (ref 6–23)
Chloride: 102 mEq/L (ref 96–112)
Creatinine, Ser: 1 mg/dL (ref 0.4–1.5)
Total Bilirubin: 0.6 mg/dL (ref 0.3–1.2)

## 2012-07-19 LAB — HEMOGLOBIN A1C: Hgb A1c MFr Bld: 5.9 % (ref 4.6–6.5)

## 2012-07-19 LAB — LIPID PANEL
HDL: 68.4 mg/dL (ref >39.00)
LDL Cholesterol: 106 mg/dL — ABNORMAL HIGH (ref 0–99)
Total CHOL/HDL Ratio: 3
Triglycerides: 52 mg/dL (ref 0.0–149.0)
VLDL: 10.4 mg/dL (ref 0.0–40.0)

## 2012-07-26 ENCOUNTER — Ambulatory Visit (INDEPENDENT_AMBULATORY_CARE_PROVIDER_SITE_OTHER): Payer: Medicare Other | Admitting: Family Medicine

## 2012-07-26 ENCOUNTER — Encounter: Payer: Self-pay | Admitting: Family Medicine

## 2012-07-26 VITALS — BP 180/100 | HR 100 | Temp 99.1°F | Ht 70.0 in | Wt 191.8 lb

## 2012-07-26 DIAGNOSIS — I779 Disorder of arteries and arterioles, unspecified: Secondary | ICD-10-CM

## 2012-07-26 DIAGNOSIS — I6529 Occlusion and stenosis of unspecified carotid artery: Secondary | ICD-10-CM

## 2012-07-26 DIAGNOSIS — R7402 Elevation of levels of lactic acid dehydrogenase (LDH): Secondary | ICD-10-CM

## 2012-07-26 DIAGNOSIS — Z0000 Encounter for general adult medical examination without abnormal findings: Secondary | ICD-10-CM

## 2012-07-26 DIAGNOSIS — F1010 Alcohol abuse, uncomplicated: Secondary | ICD-10-CM

## 2012-07-26 DIAGNOSIS — R7401 Elevation of levels of liver transaminase levels: Secondary | ICD-10-CM

## 2012-07-26 DIAGNOSIS — I10 Essential (primary) hypertension: Secondary | ICD-10-CM

## 2012-07-26 DIAGNOSIS — E785 Hyperlipidemia, unspecified: Secondary | ICD-10-CM

## 2012-07-26 MED ORDER — VALSARTAN 160 MG PO TABS: 160.0000 mg | ORAL_TABLET | Freq: Every day | ORAL | Status: DC

## 2012-07-26 NOTE — Patient Instructions (Addendum)
F/u 1 month  REFERRAL: GO THE THE FRONT ROOM AT THE ENTRANCE OF OUR CLINIC, NEAR CHECK IN. ASK FOR MARION. SHE WILL HELP YOU SET UP YOUR REFERRAL. DATE: TIME:  

## 2012-07-26 NOTE — Progress Notes (Signed)
Nature conservation officer at [facility] ([facility]) [address] [place] [address] Phone: [phone] Fax: [phone]  Date:  07/26/2012   Name:  Devin Garza   DOB:  1941/10/24   MRN:  [id number]  PCP:  Hannah Beat, MD    Chief Complaint: Annual Exam   History of Present Illness:  Devin Garza is a 71 y.o. very pleasant male patient who presents with the following:  The patient presents for a Medicare wellness exam, he also has some acute issues as well.  Preventative Health Maintenance Visit:  Health Maintenance Summary Reviewed and updated, unless pt declines services.  Tobacco History Reviewed. Alcohol: 4-5 drinks a day Exercise Habits: walks about 45 mins a day, plays golf STD concerns: no risk or activity to increase risk Drug Use: None  Health Maintenance  Topic Date Due  . Zostavax  09/10/2000  . Influenza Vaccine  09/26/2012  . Tetanus/tdap  12/27/2013  . Colonoscopy  04/21/2020  . Pneumococcal Polysaccharide Vaccine Age 37 And Over  Completed    Labs reviewed with the patient.  Results for orders placed in visit on 07/19/12  COMPREHENSIVE METABOLIC PANEL      Component Value Range   Sodium 137  135 - 145 mEq/L   Potassium 4.5  3.5 - 5.1 mEq/L   Chloride 102  96 - 112 mEq/L   CO2 24  19 - 32 mEq/L   Glucose, Bld 121 (*) 70 - 99 mg/dL   BUN 26 (*) 6 - 23 mg/dL   Creatinine, Ser 1.0  0.4 - 1.5 mg/dL   Total Bilirubin 0.6  0.3 - 1.2 mg/dL   Alkaline Phosphatase 43  39 - 117 U/L   AST 65 (*) 0 - 37 U/L   ALT 69 (*) 0 - 53 U/L   Total Protein 7.1  6.0 - 8.3 g/dL   Albumin 4.1  3.5 - 5.2 g/dL   Calcium 9.3  8.4 - 86.5 mg/dL   GFR 78.46  >96.29 mL/min  LIPID PANEL      Component Value Range   Cholesterol 185  0 - 200 mg/dL   Triglycerides 52.8  0.0 - 149.0 mg/dL   HDL 41.32  >44.01 mg/dL   VLDL 02.7  0.0 - 25.3 mg/dL   LDL Cholesterol 664 (*) 0 - 99 mg/dL   Total CHOL/HDL Ratio 3    HEMOGLOBIN Q0H      Component Value Range   Hemoglobin A1C 5.9   4.6 - 6.5 %     BP: 170's last week at BP.  HTN: Tolerating all medications without side effects, her blood pressures have deteriorated. He was previously on ACE inhibitor. Stable and at goal No CP, no sob. No HA.  BP Readings from Last 3 Encounters:  07/26/12 180/100  04/21/11 140/84  01/21/11 160/88    Basic Metabolic Panel:    Component Value Date/Time   NA 137 07/19/2012 0816   K 4.5 07/19/2012 0816   CL 102 07/19/2012 0816   CO2 24 07/19/2012 0816   BUN 26* 07/19/2012 0816   CREATININE 1.0 07/19/2012 0816   GLUCOSE 121* 07/19/2012 0816   CALCIUM 9.3 07/19/2012 0816     Lipids: Doing well, stable. Tolerating meds fine with no SE. Panel reviewed with patient.  Lipids:    Component Value Date/Time   CHOL 185 07/19/2012 0816   TRIG 52.0 07/19/2012 0816   HDL 68.40 07/19/2012 0816   LDLDIRECT 132.0 01/18/2011 0914   VLDL 10.4 07/19/2012  0816   CHOLHDL 3 07/19/2012 0816    Lab Results  Component Value Date   ALT 69* 07/19/2012   AST 65* 07/19/2012   ALKPHOS 43 07/19/2012   BILITOT 0.6 07/19/2012    Liver function enzymes are elevated. Last year they were elevated as well, but they have decreased after he lowered his alcohol intake. He is drinking at least 4 or 5 drinks a day right now. Last year he did successfully dramatically decreases intake. Drinking four or five a day. Walks 40-50 mins a day. AST and ALT elevated.  Past Medical History, Surgical History, Social History, Family History, Problem List, Medications, and Allergies have been reviewed and updated if relevant.  Current Outpatient Prescriptions on File Prior to Visit  Medication Sig Dispense Refill  . buPROPion (WELLBUTRIN XL) 150 MG 24 hr tablet TAKE 1 TABLET DAILY  90 tablet  1  . DIOVAN 80 MG tablet TAKE 1 TABLET DAILY  90 tablet  2  . simvastatin (ZOCOR) 10 MG tablet TAKE 1 TABLET DAILY  90 tablet  2  . zolpidem (AMBIEN) 10 MG tablet Take 1 tablet (10 mg total) by mouth at bedtime as needed.  30 tablet  5      Review of Systems:  General: Denies fever, chills, sweats. No significant weight loss. Eyes: Denies blurring,significant itching ENT: Denies earache, sore throat, and hoarseness. Cardiovascular: Denies chest pains, palpitations, dyspnea on exertion Respiratory: Denies cough, dyspnea at rest,wheeezing Breast: no concerns about lumps GI: Denies nausea, vomiting, diarrhea, constipation, change in bowel habits, abdominal pain, melena, hematochezia GU: Denies penile discharge, ED, urinary flow / outflow problems. No STD concerns. Musculoskeletal: Denies back pain, joint pain Derm: Denies rash, itching Neuro: Denies  paresthesias, frequent falls, frequent headaches Psych: Denies depression, anxiety Endocrine: Denies cold intolerance, heat intolerance, polydipsia Heme: Denies enlarged lymph nodes Allergy: No hayfever   Physical Examination: Filed Vitals:   07/26/12 0818  BP: 180/100  Pulse: 100  Temp: 99.1 F (37.3 C)   Filed Vitals:   07/26/12 0818  Height: 5\' 10"  (1.778 m)  Weight: 191 lb 12 oz (86.977 kg)   Body mass index is 27.51 kg/(m^2). Ideal Body Weight: Weight in (lb) to have BMI = 25: 173.9   GEN: well developed, well nourished, no acute distress Eyes: conjunctiva and lids normal, PERRLA, EOMI ENT: TM clear, nares clear, oral exam WNL Neck: supple, no lymphadenopathy, no thyromegaly, no JVD Pulm: clear to auscultation and percussion, respiratory effort normal CV: regular rate and rhythm, S1-S2, no murmur, rub or gallop GI: soft, non-tender; no hepatosplenomegaly, masses; active bowel sounds all quadrants GU: pt deferred Lymph: no cervical, axillary or inguinal adenopathy MSK: gait normal, muscle tone and strength WNL, no joint swelling, effusions, discoloration, crepitus  SKIN: clear, good turgor, color WNL, no rashes, lesions, or ulcerations Neuro: normal mental status, normal strength, sensation, and motion Psych: alert; oriented to person, place and time,  normally interactive and not anxious or depressed in appearance.   Assessment and Plan: 1. Routine general medical examination at a health care facility : overall, he is feeling well   2. Carotid arterial disease  Doppler carotid  3. TRANSAMINASES, SERUM, ELEVATED : concerning, cautioned him about his alcohol intake, and we will recheck them in a month   4. ALCOHOL ABUSE    5. CAROTID ARTERY STENOSIS, BILATERAL    6. HYPERTENSION : deteriorated to a concerning level. Prior change to diovan, increase to 160 mg. Concern that ETOH may be playing a  significant role, so I want to recheck when his ETOH consumption is lower   7. HYPERLIPIDEMIA stable     I have personally reviewed the Medicare Annual Wellness questionnaire and have noted 1. The patient's medical and social history 2. Their use of alcohol, tobacco or illicit drugs 3. Their current medications and supplements 4. The patient's functional ability including ADL's, fall risks, home safety risks and hearing or visual             impairment. 5. Diet and physical activities 6. Evidence for depression or mood disorders  The patients weight, height, BMI and visual acuity have been recorded in the chart I have made referrals, counseling and provided education to the patient based review of the above and I have provided the pt with a written personalized care plan for preventive services.  I have provided the patient with a copy of your personalized plan for preventive services. Instructed to take the time to review along with their updated medication list.   Hannah Beat, MD

## 2012-08-17 ENCOUNTER — Encounter (INDEPENDENT_AMBULATORY_CARE_PROVIDER_SITE_OTHER): Payer: Medicare Other

## 2012-08-17 DIAGNOSIS — I6529 Occlusion and stenosis of unspecified carotid artery: Secondary | ICD-10-CM | POA: Diagnosis not present

## 2012-08-23 ENCOUNTER — Encounter: Payer: Self-pay | Admitting: Family Medicine

## 2012-08-23 ENCOUNTER — Ambulatory Visit (INDEPENDENT_AMBULATORY_CARE_PROVIDER_SITE_OTHER): Payer: Medicare Other | Admitting: Family Medicine

## 2012-08-23 VITALS — BP 158/98 | HR 76 | Temp 98.6°F | Ht 70.0 in | Wt 191.2 lb

## 2012-08-23 DIAGNOSIS — I10 Essential (primary) hypertension: Secondary | ICD-10-CM

## 2012-08-23 MED ORDER — DIOVAN HCT 160-12.5 MG PO TABS: 1.0000 | ORAL_TABLET | Freq: Every day | ORAL | Status: DC

## 2012-08-23 NOTE — Progress Notes (Signed)
[facility] at [facility] [address] [place] [address] Phone: [phone] Fax: [phone]  Date:  08/23/2012   Name:  Devin Garza   DOB:  03-10-1941   MRN:  [id number] Gender: male Age: 71 y.o.  PCP:  Hannah Beat, MD    Chief Complaint: Follow-up   History of Present Illness:  Devin Garza is a 71 y.o. pleasant patient who presents with the following:  HTN: Tolerating all medications without side effects Still not at goal, 158/98, butimproved. No CP, no sob. No HA.  BP Readings from Last 3 Encounters:  08/23/12 158/98  07/26/12 180/100  04/21/11 140/84    Reviewed stable carotid u/s  Basic Metabolic Panel:    Component Value Date/Time   NA 137 07/19/2012 0816   K 4.5 07/19/2012 0816   CL 102 07/19/2012 0816   CO2 24 07/19/2012 0816   BUN 26* 07/19/2012 0816   CREATININE 1.0 07/19/2012 0816   GLUCOSE 121* 07/19/2012 0816   CALCIUM 9.3 07/19/2012 0816       Patient Active Problem List  Diagnosis  . CARCINOMA, BASAL CELL  . HYPERLIPIDEMIA  . UNSPECIFIED ANEMIA  . HYPERTENSION  . CAROTID ARTERY STENOSIS, BILATERAL  . SKIN LESION  . INSOMNIA UNSPECIFIED  . HYPERGLYCEMIA  . FECAL OCCULT BLOOD  . ALCOHOL ABUSE  . FATIGUE  . TRANSAMINASES, SERUM, ELEVATED  . Prediabetes    Past Medical History  Diagnosis Date  . Hypertension   . HLD (hyperlipidemia)   . Osteoarthritis   . Basal cell carcinoma of lip 2007  . ED (erectile dysfunction)   . Tobacco abuse     Past Surgical History  Procedure Date  . Skin cancer excision     LIP  . Ankle arthroscopy w/ open repair     History  Substance Use Topics  . Smoking status: Current Everyday Smoker -- 0.5 packs/day for 40 years    Types: Cigarettes  . Smokeless tobacco: Never Used   Comment: cigars occassionally  . Alcohol Use: Yes    Family History  Problem Relation Age of Onset  . Cancer Mother 48    LUNG  . Cancer Father     THROAT  . Hearing loss Father     HEART  ATTACK, CAD  . Cancer Sister     OVARIAN    No Known Allergies  Medication list has been reviewed and updated.  Current Outpatient Prescriptions on File Prior to Visit  Medication Sig Dispense Refill  . buPROPion (WELLBUTRIN XL) 150 MG 24 hr tablet TAKE 1 TABLET DAILY  90 tablet  1  . simvastatin (ZOCOR) 10 MG tablet TAKE 1 TABLET DAILY  90 tablet  2  . valsartan (DIOVAN) 160 MG tablet Take 1 tablet (160 mg total) by mouth daily.  90 tablet  3  . zolpidem (AMBIEN) 10 MG tablet Take 1 tablet (10 mg total) by mouth at bedtime as needed.  30 tablet  5  . DIOVAN HCT 160-12.5 MG per tablet Take 1 tablet by mouth daily.  30 tablet  1    Review of Systems:   GEN: No acute illnesses, no fevers, chills. GI: No n/v/d, eating normally Pulm: No SOB Interactive and getting along well at home.  Otherwise, ROS is as per the HPI.   Physical Examination: Filed Vitals:   08/23/12 1121  BP: 158/98  Pulse: 76  Temp: 98.6 F (37 C)   Filed Vitals:   08/23/12 1121  Height:  5\' 10"  (1.778 m)  Weight: 191 lb 4 oz (86.75 kg)   Body mass index is 27.44 kg/(m^2). Ideal Body Weight: Weight in (lb) to have BMI = 25: 173.9    GEN: WDWN, NAD, Non-toxic, A & O x 3 HEENT: Atraumatic, Normocephalic. Neck supple. No masses, No LAD. Ears and Nose: No external deformity. CV: RRR, No M/G/R. No JVD. No thrill. No extra heart sounds. PULM: CTA B, no wheezes, crackles, rhonchi. No retractions. No resp. distress. No accessory muscle use. EXTR: No c/c/e NEURO Normal gait.  PSYCH: Normally interactive. Conversant. Not depressed or anxious appearing.  Calm demeanor.    Assessment and Plan: 1. HYPERTENSION     Increase to Diovan HCT 160/12.5 (add hctz)  Hannah Beat, MD

## 2012-09-19 ENCOUNTER — Encounter: Payer: Self-pay | Admitting: Gastroenterology

## 2012-09-22 DIAGNOSIS — Z23 Encounter for immunization: Secondary | ICD-10-CM | POA: Diagnosis not present

## 2012-10-04 ENCOUNTER — Encounter: Payer: Self-pay | Admitting: Family Medicine

## 2012-10-04 ENCOUNTER — Ambulatory Visit (INDEPENDENT_AMBULATORY_CARE_PROVIDER_SITE_OTHER): Payer: Medicare Other | Admitting: Family Medicine

## 2012-10-04 VITALS — BP 158/80 | HR 76 | Temp 98.6°F | Wt 192.8 lb

## 2012-10-04 DIAGNOSIS — I10 Essential (primary) hypertension: Secondary | ICD-10-CM | POA: Diagnosis not present

## 2012-10-04 MED ORDER — VALSARTAN-HYDROCHLOROTHIAZIDE 320-12.5 MG PO TABS: 1.0000 | ORAL_TABLET | Freq: Every day | ORAL | Status: DC

## 2012-10-04 NOTE — Progress Notes (Signed)
[facility] at [facility] [address] [place] [address] Phone: [phone] Fax: [phone]  Date:  10/04/2012   Name:  ASHYR HEDGEPATH   DOB:  Sep 04, 1941   MRN:  [id number] Gender: male Age: 71 y.o.  PCP:  Hannah Beat, MD    Chief Complaint: Blood Pressure Check   History of Present Illness:  BION TODOROV is a 71 y.o. pleasant patient who presents with the following:  HTN: Tolerating all medications without side effects 150's/80's-90's with about 10 readings at home No CP, no sob. No HA. Feeling better  BP Readings from Last 3 Encounters:  10/04/12 158/80  08/23/12 158/98  07/26/12 180/100    Basic Metabolic Panel:    Component Value Date/Time   NA 137 07/19/2012 0816   K 4.5 07/19/2012 0816   CL 102 07/19/2012 0816   CO2 24 07/19/2012 0816   BUN 26* 07/19/2012 0816   CREATININE 1.0 07/19/2012 0816   GLUCOSE 121* 07/19/2012 0816   CALCIUM 9.3 07/19/2012 0816      Patient Active Problem List  Diagnosis  . CARCINOMA, BASAL CELL  . HYPERLIPIDEMIA  . UNSPECIFIED ANEMIA  . HYPERTENSION  . CAROTID ARTERY STENOSIS, BILATERAL  . SKIN LESION  . INSOMNIA UNSPECIFIED  . HYPERGLYCEMIA  . FECAL OCCULT BLOOD  . ALCOHOL ABUSE  . FATIGUE  . TRANSAMINASES, SERUM, ELEVATED  . Prediabetes    Past Medical History  Diagnosis Date  . Hypertension   . HLD (hyperlipidemia)   . Osteoarthritis   . Basal cell carcinoma of lip 2007  . ED (erectile dysfunction)   . Tobacco abuse     Past Surgical History  Procedure Date  . Skin cancer excision     LIP  . Ankle arthroscopy w/ open repair     History  Substance Use Topics  . Smoking status: Current Every Day Smoker -- 0.5 packs/day for 40 years    Types: Cigarettes  . Smokeless tobacco: Never Used   Comment: cigars occassionally  . Alcohol Use: Yes    Family History  Problem Relation Age of Onset  . Cancer Mother 32    LUNG  . Cancer Father     THROAT  . Hearing loss Father     HEART  ATTACK, CAD  . Cancer Sister     OVARIAN    No Known Allergies  Medication list has been reviewed and updated.  Outpatient Prescriptions Prior to Visit  Medication Sig Dispense Refill  . buPROPion (WELLBUTRIN XL) 150 MG 24 hr tablet TAKE 1 TABLET DAILY  90 tablet  1  . DIOVAN HCT 160-12.5 MG per tablet Take 1 tablet by mouth daily.  30 tablet  1  . Multiple Vitamin (MULTIVITAMIN) tablet Take 1 tablet by mouth daily.      . simvastatin (ZOCOR) 10 MG tablet TAKE 1 TABLET DAILY  90 tablet  2  . zolpidem (AMBIEN) 10 MG tablet Take 1 tablet (10 mg total) by mouth at bedtime as needed.  30 tablet  5  . valsartan (DIOVAN) 160 MG tablet Take 1 tablet (160 mg total) by mouth daily.  90 tablet  3    Review of Systems:   GEN: No acute illnesses, no fevers, chills. GI: No n/v/d, eating normally Pulm: No SOB Interactive and getting along well at home.  Otherwise, ROS is as per the HPI.   Physical Examination: Filed Vitals:   10/04/12 0903  BP: 158/80  Pulse: 76  Temp: 98.6  F (37 C)   Filed Vitals:   10/04/12 0903  Weight: 192 lb 12 oz (87.431 kg)   There is no height on file to calculate BMI. Ideal Body Weight:     GEN: WDWN, NAD, Non-toxic, A & O x 3 HEENT: Atraumatic, Normocephalic. Neck supple. No masses, No LAD. Ears and Nose: No external deformity. CV: RRR, No M/G/R. No JVD. No thrill. No extra heart sounds. PULM: CTA B, no wheezes, crackles, rhonchi. No retractions. No resp. distress. No accessory muscle use. EXTR: No c/c/e NEURO Normal gait.  PSYCH: Normally interactive. Conversant. Not depressed or anxious appearing.  Calm demeanor.    Assessment and Plan:  1. HYPERTENSION    incr dose If stable, give 90 d supply. If not increase hctz portion  Orders Today:  No orders of the defined types were placed in this encounter.    Updated Medication List: (Includes new medications, updates to list, dose adjustments) Meds ordered this encounter  Medications  .  valsartan-hydrochlorothiazide (DIOVAN-HCT) 320-12.5 MG per tablet    Sig: Take 1 tablet by mouth daily.    Dispense:  30 tablet    Refill:  3    Medications Discontinued: Medications Discontinued During This Encounter  Medication Reason  . valsartan (DIOVAN) 160 MG tablet Change in therapy  . DIOVAN HCT 160-12.5 MG per tablet      Hannah Beat, MD

## 2012-10-31 ENCOUNTER — Other Ambulatory Visit: Payer: Self-pay | Admitting: Family Medicine

## 2013-01-22 ENCOUNTER — Other Ambulatory Visit: Payer: Self-pay | Admitting: Family Medicine

## 2013-01-22 MED ORDER — VALSARTAN-HYDROCHLOROTHIAZIDE 320-12.5 MG PO TABS: 1.0000 | ORAL_TABLET | Freq: Every day | ORAL | Status: DC

## 2013-01-22 NOTE — Progress Notes (Signed)
Let him know - bp looks good.  Good news, refills ready

## 2013-01-22 NOTE — Progress Notes (Signed)
Patient advised.

## 2013-02-09 ENCOUNTER — Other Ambulatory Visit: Payer: Self-pay | Admitting: Family Medicine

## 2013-02-26 DIAGNOSIS — D1800 Hemangioma unspecified site: Secondary | ICD-10-CM | POA: Diagnosis not present

## 2013-02-26 DIAGNOSIS — B079 Viral wart, unspecified: Secondary | ICD-10-CM | POA: Diagnosis not present

## 2013-02-26 DIAGNOSIS — L570 Actinic keratosis: Secondary | ICD-10-CM | POA: Diagnosis not present

## 2013-02-26 DIAGNOSIS — L821 Other seborrheic keratosis: Secondary | ICD-10-CM | POA: Diagnosis not present

## 2013-02-26 DIAGNOSIS — L578 Other skin changes due to chronic exposure to nonionizing radiation: Secondary | ICD-10-CM | POA: Diagnosis not present

## 2013-02-26 DIAGNOSIS — Z85828 Personal history of other malignant neoplasm of skin: Secondary | ICD-10-CM | POA: Diagnosis not present

## 2013-03-12 DIAGNOSIS — L570 Actinic keratosis: Secondary | ICD-10-CM | POA: Diagnosis not present

## 2013-04-07 ENCOUNTER — Other Ambulatory Visit: Payer: Self-pay | Admitting: Family Medicine

## 2013-04-09 DIAGNOSIS — L570 Actinic keratosis: Secondary | ICD-10-CM | POA: Diagnosis not present

## 2013-04-09 NOTE — Telephone Encounter (Signed)
rx called to pharmacy 

## 2013-04-09 NOTE — Telephone Encounter (Signed)
Ok to refill #30, 3 refills

## 2013-06-11 DIAGNOSIS — D239 Other benign neoplasm of skin, unspecified: Secondary | ICD-10-CM | POA: Diagnosis not present

## 2013-06-11 DIAGNOSIS — C44319 Basal cell carcinoma of skin of other parts of face: Secondary | ICD-10-CM | POA: Diagnosis not present

## 2013-06-11 DIAGNOSIS — L821 Other seborrheic keratosis: Secondary | ICD-10-CM | POA: Diagnosis not present

## 2013-06-11 DIAGNOSIS — D485 Neoplasm of uncertain behavior of skin: Secondary | ICD-10-CM | POA: Diagnosis not present

## 2013-06-11 DIAGNOSIS — L578 Other skin changes due to chronic exposure to nonionizing radiation: Secondary | ICD-10-CM | POA: Diagnosis not present

## 2013-06-11 DIAGNOSIS — Z85828 Personal history of other malignant neoplasm of skin: Secondary | ICD-10-CM | POA: Diagnosis not present

## 2013-06-11 DIAGNOSIS — L570 Actinic keratosis: Secondary | ICD-10-CM | POA: Diagnosis not present

## 2013-07-11 DIAGNOSIS — H04129 Dry eye syndrome of unspecified lacrimal gland: Secondary | ICD-10-CM | POA: Diagnosis not present

## 2013-07-18 DIAGNOSIS — C44319 Basal cell carcinoma of skin of other parts of face: Secondary | ICD-10-CM | POA: Diagnosis not present

## 2013-07-21 ENCOUNTER — Other Ambulatory Visit: Payer: Self-pay | Admitting: Family Medicine

## 2013-09-21 DIAGNOSIS — Z23 Encounter for immunization: Secondary | ICD-10-CM | POA: Diagnosis not present

## 2013-10-19 ENCOUNTER — Other Ambulatory Visit: Payer: Self-pay | Admitting: Family Medicine

## 2013-10-19 ENCOUNTER — Telehealth: Payer: Self-pay | Admitting: Family Medicine

## 2013-10-19 NOTE — Telephone Encounter (Signed)
Pt has appt on 11/18 to see Dr. Patsy Lager.  Requesting 90 day supply of Diovan to be printed for pt to pick up.  Pt states he does not have enough medication to last until appt and a 30 day supply will cost as much as a 90 day supply.  Please call pt when RX ready to be picked up.

## 2013-10-20 DIAGNOSIS — L03019 Cellulitis of unspecified finger: Secondary | ICD-10-CM | POA: Diagnosis not present

## 2013-10-22 MED ORDER — VALSARTAN-HYDROCHLOROTHIAZIDE 320-12.5 MG PO TABS: 1.0000 | ORAL_TABLET | Freq: Every day | ORAL | Status: DC

## 2013-10-22 NOTE — Telephone Encounter (Signed)
Devin Garza notified prescription is ready to be picked up at front desk.

## 2013-10-31 NOTE — Telephone Encounter (Signed)
Error

## 2013-11-12 ENCOUNTER — Other Ambulatory Visit (INDEPENDENT_AMBULATORY_CARE_PROVIDER_SITE_OTHER): Payer: Medicare Other

## 2013-11-12 ENCOUNTER — Ambulatory Visit: Payer: Medicare Other

## 2013-11-12 DIAGNOSIS — N400 Enlarged prostate without lower urinary tract symptoms: Secondary | ICD-10-CM

## 2013-11-12 DIAGNOSIS — Z125 Encounter for screening for malignant neoplasm of prostate: Secondary | ICD-10-CM

## 2013-11-12 DIAGNOSIS — E785 Hyperlipidemia, unspecified: Secondary | ICD-10-CM

## 2013-11-12 DIAGNOSIS — R7309 Other abnormal glucose: Secondary | ICD-10-CM

## 2013-11-12 DIAGNOSIS — Z79899 Other long term (current) drug therapy: Secondary | ICD-10-CM

## 2013-11-12 LAB — LIPID PANEL
Cholesterol: 170 mg/dL (ref 0–200)
HDL: 75.6 mg/dL (ref >39.00)
LDL Cholesterol: 88 mg/dL (ref 0–99)
Total CHOL/HDL Ratio: 2
Triglycerides: 34 mg/dL (ref 0.0–149.0)

## 2013-11-12 LAB — CBC WITH DIFFERENTIAL/PLATELET
Basophils Relative: 0.9 % (ref 0.0–3.0)
Eosinophils Relative: 5.5 % — ABNORMAL HIGH (ref 0.0–5.0)
Lymphocytes Relative: 21 % (ref 12.0–46.0)
Lymphs Abs: 1.3 10*3/uL (ref 0.7–4.0)
MCHC: 34.1 g/dL (ref 30.0–36.0)
MCV: 96.7 fl (ref 78.0–100.0)
Monocytes Absolute: 0.8 10*3/uL (ref 0.1–1.0)
Monocytes Relative: 12.9 % — ABNORMAL HIGH (ref 3.0–12.0)
Neutro Abs: 3.8 10*3/uL (ref 1.4–7.7)
Neutrophils Relative %: 59.7 % (ref 43.0–77.0)
Platelets: 219 10*3/uL (ref 150.0–400.0)
RBC: 3.77 Mil/uL — ABNORMAL LOW (ref 4.22–5.81)
RDW: 13.1 % (ref 11.5–14.6)
WBC: 6.4 10*3/uL (ref 4.5–10.5)

## 2013-11-12 LAB — HEMOGLOBIN A1C: Hgb A1c MFr Bld: 5.7 % (ref 4.6–6.5)

## 2013-11-12 LAB — HEPATIC FUNCTION PANEL
AST: 38 U/L — ABNORMAL HIGH (ref 0–37)
Albumin: 4.2 g/dL (ref 3.5–5.2)
Alkaline Phosphatase: 44 U/L (ref 39–117)
Total Protein: 7.2 g/dL (ref 6.0–8.3)

## 2013-11-12 LAB — BASIC METABOLIC PANEL
BUN: 24 mg/dL — ABNORMAL HIGH (ref 6–23)
Calcium: 9.6 mg/dL (ref 8.4–10.5)
Chloride: 102 mEq/L (ref 96–112)
Creatinine, Ser: 1.1 mg/dL (ref 0.4–1.5)
GFR: 72.75 mL/min (ref >60.00)
Glucose, Bld: 135 mg/dL — ABNORMAL HIGH (ref 70–99)
Potassium: 4.9 mEq/L (ref 3.5–5.1)

## 2013-11-12 LAB — PSA: PSA: 1.09 ng/mL (ref 0.10–4.00)

## 2013-11-14 ENCOUNTER — Ambulatory Visit (INDEPENDENT_AMBULATORY_CARE_PROVIDER_SITE_OTHER): Payer: Medicare Other | Admitting: Family Medicine

## 2013-11-14 ENCOUNTER — Encounter: Payer: Self-pay | Admitting: Family Medicine

## 2013-11-14 VITALS — BP 130/78 | HR 88 | Temp 98.4°F | Ht 69.0 in | Wt 183.5 lb

## 2013-11-14 DIAGNOSIS — Z0000 Encounter for general adult medical examination without abnormal findings: Secondary | ICD-10-CM

## 2013-11-14 DIAGNOSIS — Z1211 Encounter for screening for malignant neoplasm of colon: Secondary | ICD-10-CM | POA: Diagnosis not present

## 2013-11-14 DIAGNOSIS — D369 Benign neoplasm, unspecified site: Secondary | ICD-10-CM | POA: Diagnosis not present

## 2013-11-14 DIAGNOSIS — E785 Hyperlipidemia, unspecified: Secondary | ICD-10-CM

## 2013-11-14 DIAGNOSIS — I6529 Occlusion and stenosis of unspecified carotid artery: Secondary | ICD-10-CM | POA: Diagnosis not present

## 2013-11-14 DIAGNOSIS — G4700 Insomnia, unspecified: Secondary | ICD-10-CM

## 2013-11-14 DIAGNOSIS — D649 Anemia, unspecified: Secondary | ICD-10-CM

## 2013-11-14 DIAGNOSIS — R195 Other fecal abnormalities: Secondary | ICD-10-CM

## 2013-11-14 DIAGNOSIS — F1010 Alcohol abuse, uncomplicated: Secondary | ICD-10-CM

## 2013-11-14 DIAGNOSIS — I10 Essential (primary) hypertension: Secondary | ICD-10-CM

## 2013-11-14 MED ORDER — ATORVASTATIN CALCIUM 10 MG PO TABS: 10.0000 mg | ORAL_TABLET | Freq: Every day | ORAL | Status: DC

## 2013-11-14 MED ORDER — BUPROPION HCL ER (XL) 150 MG PO TB24: ORAL_TABLET | ORAL | Status: DC

## 2013-11-14 MED ORDER — ZOLPIDEM TARTRATE 10 MG PO TABS: ORAL_TABLET | ORAL | Status: DC

## 2013-11-14 NOTE — Progress Notes (Signed)
Date:  11/14/2013   Name:  Devin Garza   DOB:  [birth year]   MRN:  [id number] Gender: male Age: 72 y.o.  Primary Physician:  Hannah Beat, MD   Chief Complaint: Annual Exam   Subjective:   History of Present Illness:  Devin Garza is a 72 y.o. pleasant patient who presents with the following:  Preventative Health Maintenance Visit: Medicare Wellness. A  Health Maintenance Summary Reviewed and updated, unless pt declines services.  Tobacco History Reviewed. Alcohol: decreased use a lot and LFT's have normalized. Down a to 1 a day Exercise Habits: Some activity, rec at least 30 mins 5 times a week STD concerns: no risk or activity to increase risk Drug Use: None Encouraged self-testicular check  Health Maintenance  Topic Date Due  . Tetanus/tdap  12/27/2013  . Influenza Vaccine  07/27/2014  . Colonoscopy  04/21/2020  . Pneumococcal Polysaccharide Vaccine Age 56 And Over  Completed  . Zostavax  Completed  Colon ordered in 2011, but patient did not go. Past due.  Lipids: Doing well, stable. Tolerating meds fine with no SE. Panel reviewed with patient.  Lipids:    Component Value Date/Time   CHOL 170 11/12/2013 0854   TRIG 34.0 11/12/2013 0854   HDL 75.60 11/12/2013 0854   LDLDIRECT 132.0 01/18/2011 0914   VLDL 6.8 11/12/2013 0854   CHOLHDL 2 11/12/2013 0854    Lab Results  Component Value Date   ALT 35 11/12/2013   AST 38* 11/12/2013   ALKPHOS 44 11/12/2013   BILITOT 0.6 11/12/2013    HTN: Tolerating all medications without side effects Stable and at goal No CP, no sob. No HA.  BP Readings from Last 3 Encounters:  11/14/13 130/78  10/04/12 158/80  08/23/12 158/98    Basic Metabolic Panel:    Component Value Date/Time   NA 136 11/12/2013 0854   K 4.9 11/12/2013 0854   CL 102 11/12/2013 0854   CO2 25 11/12/2013 0854   BUN 24* 11/12/2013 0854   CREATININE 1.1 11/12/2013 0854   GLUCOSE 135* 11/12/2013 0854   CALCIUM 9.6 11/12/2013 0854    Are reviewed patient had a Hemoccult positive stool in 2010. He was referred to gastroenterology which did an upper endoscopy, and this was felt to be due to an upper gastrointestinal source. He decreased and discontinued his nonsteroidal anti-inflammatory drug use, and has felt better. Upon record review, the patient had a tubular adenoma in 2006 on his last colonoscopy. He currently has some mild anemia today. It looks as if I referred him for colonoscopy in 2011, but no procedure was completed.  Patient Active Problem List   Diagnosis Date Noted  . Prediabetes 04/21/2011  . ALCOHOL ABUSE 01/21/2011  . TRANSAMINASES, SERUM, ELEVATED 01/21/2011  . FATIGUE 01/18/2011  . UNSPECIFIED ANEMIA 11/12/2009  . FECAL OCCULT BLOOD 11/12/2009  . CARCINOMA, BASAL CELL 09/03/2008  . CAROTID ARTERY STENOSIS, BILATERAL 08/28/2008  . INSOMNIA UNSPECIFIED 08/28/2008  . HYPERLIPIDEMIA 08/27/2008  . HYPERTENSION 08/27/2008    Past Medical History  Diagnosis Date  . Hypertension   . HLD (hyperlipidemia)   . Osteoarthritis   . Basal cell carcinoma of lip 2007  . ED (erectile dysfunction)   . Tobacco abuse     Past Surgical History  Procedure Laterality Date  . Skin cancer excision      LIP  . Ankle arthroscopy w/ open repair      History   Social History  . Marital Status: Divorced  Spouse Name: N/A    Number of Children: 2  . Years of Education: N/A   Occupational History  .     Social History Main Topics  . Smoking status: Current Every Day Smoker -- 0.50 packs/day for 40 years    Types: Cigarettes  . Smokeless tobacco: Never Used     Comment: cigars occassionally  . Alcohol Use: Yes  . Drug Use: No  . Sexual Activity: Not on file   Other Topics Concern  . Not on file   Social History Narrative  . No narrative on file    Family History  Problem Relation Age of Onset  . Cancer Mother 38    LUNG  . Cancer Father     THROAT  . Hearing loss Father     HEART ATTACK,  CAD  . Cancer Sister     OVARIAN    No Known Allergies  Medication list has been reviewed and updated.  Review of Systems:  General: Denies fever, chills, sweats. No significant weight loss. Eyes: Denies blurring,significant itching ENT: Denies earache, sore throat, and hoarseness. Cardiovascular: Denies chest pains, palpitations, dyspnea on exertion Respiratory: Denies cough, dyspnea at rest,wheeezing Breast: no concerns about lumps GI: Denies nausea, vomiting, diarrhea, constipation, change in bowel habits, abdominal pain, melena, hematochezia GU: Denies penile discharge, ED, urinary flow / outflow problems. No STD concerns. Musculoskeletal: Denies back pain, joint pain Derm: Denies rash, itching Neuro: Denies  paresthesias, frequent falls, frequent headaches Psych: Denies depression, anxiety Endocrine: Denies cold intolerance, heat intolerance, polydipsia Heme: Denies enlarged lymph nodes Allergy: No hayfever  Objective:   Physical Examination: BP 130/78  Pulse 88  Temp(Src) 98.4 F (36.9 C) (Oral)  Ht 5\' 9"  (1.753 m)  Wt 183 lb 8 oz (83.235 kg)  BMI 27.09 kg/m2  SpO2 99% Ideal Body Weight: Weight in (lb) to have BMI = 25: 168.9  GEN: well developed, well nourished, no acute distress Eyes: conjunctiva and lids normal, PERRLA, EOMI ENT: TM clear, nares clear, oral exam WNL Neck: supple, no lymphadenopathy, no thyromegaly, no JVD Pulm: clear to auscultation and percussion, respiratory effort normal CV: regular rate and rhythm, S1-S2, no murmur, rub or gallop, no bruits, peripheral pulses normal and symmetric, no cyanosis, clubbing, edema or varicosities GI: soft, non-tender; no hepatosplenomegaly, masses; active bowel sounds all quadrants GU: no hernia, testicular mass, penile discharge Lymph: no cervical, axillary or inguinal adenopathy MSK: gait normal, muscle tone and strength WNL, no joint swelling, effusions, discoloration, crepitus  SKIN: clear, good turgor,  color WNL, no rashes, lesions, or ulcerations Neuro: normal mental status, normal strength, sensation, and motion Psych: alert; oriented to person, place and time, normally interactive and not anxious or depressed in appearance.  All labs reviewed with patient.  Lipids:    Component Value Date/Time   CHOL 170 11/12/2013 0854   TRIG 34.0 11/12/2013 0854   HDL 75.60 11/12/2013 0854   LDLDIRECT 132.0 01/18/2011 0914   VLDL 6.8 11/12/2013 0854   CHOLHDL 2 11/12/2013 0854    CBC:    Component Value Date/Time   WBC 6.4 11/12/2013 0854   HGB 12.4* 11/12/2013 0854   HCT 36.4* 11/12/2013 0854   PLT 219.0 11/12/2013 0854   MCV 96.7 11/12/2013 0854   NEUTROABS 3.8 11/12/2013 0854   LYMPHSABS 1.3 11/12/2013 0854   MONOABS 0.8 11/12/2013 0854   EOSABS 0.4 11/12/2013 0854   BASOSABS 0.1 11/12/2013 0854    Basic Metabolic Panel:    Component Value Date/Time  NA 136 11/12/2013 0854   K 4.9 11/12/2013 0854   CL 102 11/12/2013 0854   CO2 25 11/12/2013 0854   BUN 24* 11/12/2013 0854   CREATININE 1.1 11/12/2013 0854   GLUCOSE 135* 11/12/2013 0854   CALCIUM 9.6 11/12/2013 0854    Lab Results  Component Value Date   ALT 35 11/12/2013   AST 38* 11/12/2013   ALKPHOS 44 11/12/2013   BILITOT 0.6 11/12/2013    Lab Results  Component Value Date   TSH 1.35 01/18/2011    Lab Results  Component Value Date   PSA 1.09 11/12/2013   PSA 1.10 01/18/2011   PSA 1.33 10/08/2009    Assessment & Plan:   Health Maintenance Exam: The patient's preventative maintenance and recommended screening tests for an annual wellness exam were reviewed in full today. Brought up to date unless services declined.  Counselled on the importance of diet, exercise, and its role in overall health and mortality. The patient's FH and SH was reviewed, including their home life, tobacco status, and drug and alcohol status.  I have personally reviewed the Medicare Annual Wellness questionnaire and have  noted 1. The patient's medical and social history 2. Their use of alcohol, tobacco or illicit drugs 3. Their current medications and supplements 4. The patient's functional ability including ADL's, fall risks, home safety risks and hearing or visual             impairment. 5. Diet and physical activities 6. Evidence for depression or mood disorders  The patients weight, height, BMI and visual acuity have been recorded in the chart I have made referrals, counseling and provided education to the patient based review of the above and I have provided the pt with a written personalized care plan for preventive services.  I have provided the patient with a copy of your personalized plan for preventive services. Instructed to take the time to review along with their updated medication list. A   Routine general medical examination at a health care facility  Tubular adenoma - Plan: Ambulatory referral to Gastroenterology: with anemia, and he is overdue for his colonoscopy. Gastrointestinal consult May.  Special screening for malignant neoplasms, colon - Plan: Ambulatory referral to Gastroenterology  Occlusion and stenosis of carotid artery without mention of cerebral infarction, bilateral - Plan: Doppler carotid  HYPERLIPIDEMIA: relatively stable with some intermittent hand cramping. He would like to try Lipitor see if this has less side effects. That is very reasonable.  UNSPECIFIED ANEMIA, most likely are deficient and consult gastrointestinal.  HYPERTENSION: stable and doing much better now, compliant medications, and decreased alcohol.  FECAL OCCULT BLOOD  INSOMNIA UNSPECIFIED: rare intermittent use. P.r.n. Ambien  ALCOHOL ABUSE, he is doing much better reports that he has dramatically cut back on his alcohol use.  TRANSAMINASES, SERUM, ELEVATED, much improved  There are no Patient Instructions on file for this visit.  Orders Today:  Orders Placed This Encounter  Procedures  .  Ambulatory referral to Gastroenterology  . Doppler carotid    New medications, updates to list, dose adjustments: Meds ordered this encounter  Medications  . vitamin C (ASCORBIC ACID) 500 MG tablet    Sig: Take 500 mg by mouth daily.  Marland Kitchen CINNAMON PO    Sig: Take by mouth daily.  Marland Kitchen atorvastatin (LIPITOR) 10 MG tablet    Sig: Take 1 tablet (10 mg total) by mouth daily.    Dispense:  90 tablet    Refill:  3  . DISCONTD: buPROPion (  WELLBUTRIN XL) 150 MG 24 hr tablet    Sig: TAKE 1 TABLET DAILY    Dispense:  90 tablet    Refill:  3  . zolpidem (AMBIEN) 10 MG tablet    Sig: TAKE 1 TABLET BY MOUTH AT BEDTIME AS NEEDED    Dispense:  30 tablet    Refill:  4  . buPROPion (WELLBUTRIN XL) 150 MG 24 hr tablet    Sig: TAKE 1 TABLET DAILY    Dispense:  90 tablet    Refill:  3    Signed,  Daimien Patmon T. Melynda Krzywicki, MD, CAQ Sports Medicine  [facility] [address] [place] [address] Phone: [phone] Fax: [phone]  Updated Complete Medication List:   Medication List       This list is accurate as of: 11/14/13 11:59 PM.  Always use your most recent med list.               atorvastatin 10 MG tablet  Commonly known as:  LIPITOR  Take 1 tablet (10 mg total) by mouth daily.     buPROPion 150 MG 24 hr tablet  Commonly known as:  WELLBUTRIN XL  TAKE 1 TABLET DAILY     CINNAMON PO  Take by mouth daily.     multivitamin tablet  Take 1 tablet by mouth daily.     valsartan-hydrochlorothiazide 320-12.5 MG per tablet  Commonly known as:  DIOVAN-HCT  Take 1 tablet by mouth daily.     vitamin C 500 MG tablet  Commonly known as:  ASCORBIC ACID  Take 500 mg by mouth daily.     zolpidem 10 MG tablet  Commonly known as:  AMBIEN  TAKE 1 TABLET BY MOUTH AT BEDTIME AS NEEDED

## 2013-11-14 NOTE — Progress Notes (Signed)
Pre-visit discussion using our clinic review tool. No additional management support is needed unless otherwise documented below in the visit note.  

## 2013-11-15 DIAGNOSIS — D369 Benign neoplasm, unspecified site: Secondary | ICD-10-CM | POA: Insufficient documentation

## 2013-11-15 NOTE — Addendum Note (Signed)
Addended by: Hannah Beat on: 11/15/2013 10:27 AM   Modules accepted: Level of Service

## 2013-11-19 ENCOUNTER — Encounter: Payer: Self-pay | Admitting: Gastroenterology

## 2013-11-26 ENCOUNTER — Encounter (HOSPITAL_COMMUNITY): Payer: Medicare Other

## 2013-11-27 ENCOUNTER — Encounter (INDEPENDENT_AMBULATORY_CARE_PROVIDER_SITE_OTHER): Payer: Medicare Other

## 2013-11-27 DIAGNOSIS — I6529 Occlusion and stenosis of unspecified carotid artery: Secondary | ICD-10-CM | POA: Diagnosis not present

## 2013-12-24 ENCOUNTER — Other Ambulatory Visit: Payer: Self-pay | Admitting: *Deleted

## 2013-12-24 MED ORDER — ATORVASTATIN CALCIUM 10 MG PO TABS: 10.0000 mg | ORAL_TABLET | Freq: Every day | ORAL | Status: DC

## 2014-01-04 ENCOUNTER — Ambulatory Visit (INDEPENDENT_AMBULATORY_CARE_PROVIDER_SITE_OTHER): Payer: Medicare Other

## 2014-01-04 ENCOUNTER — Encounter: Payer: Self-pay | Admitting: Podiatry

## 2014-01-04 ENCOUNTER — Ambulatory Visit (INDEPENDENT_AMBULATORY_CARE_PROVIDER_SITE_OTHER): Payer: Medicare Other | Admitting: Podiatry

## 2014-01-04 VITALS — BP 164/106 | HR 110 | Resp 16 | Ht 70.0 in | Wt 185.0 lb

## 2014-01-04 DIAGNOSIS — B351 Tinea unguium: Secondary | ICD-10-CM | POA: Diagnosis not present

## 2014-01-04 DIAGNOSIS — M2040 Other hammer toe(s) (acquired), unspecified foot: Secondary | ICD-10-CM | POA: Diagnosis not present

## 2014-01-04 DIAGNOSIS — M79609 Pain in unspecified limb: Secondary | ICD-10-CM

## 2014-01-04 DIAGNOSIS — M79671 Pain in right foot: Secondary | ICD-10-CM

## 2014-01-04 DIAGNOSIS — L84 Corns and callosities: Secondary | ICD-10-CM

## 2014-01-04 DIAGNOSIS — M2010 Hallux valgus (acquired), unspecified foot: Secondary | ICD-10-CM

## 2014-01-04 NOTE — Progress Notes (Signed)
   Subjective:    Patient ID: Devin Garza, male    DOB: 1941-04-21, 73 y.o.   MRN: [id number]  HPI Comments: N hurts L 2nd toe right foot/ trim toenails D over 1 yr O ? C worse A shoes T pt tries to trim toenails  Toe Pain       Review of Systems  All other systems reviewed and are negative.       Objective:   Physical Exam        Assessment & Plan:

## 2014-01-04 NOTE — Progress Notes (Signed)
Subjective:     Patient ID: Devin Garza, male   DOB: 10-08-1941, 73 y.o.   MRN: [id number]  Toe Pain    patient presents stating that I have a painful second toe with elevation and my nails are painful 1-5 ingrown in the corners and I can not cut them myself do to thickness and ingrown corners   Review of Systems  All other systems reviewed and are negative.       Objective:   Physical Exam  Nursing note and vitals reviewed. Constitutional: He is oriented to person, place, and time.  Cardiovascular: Intact distal pulses.   Musculoskeletal: Normal range of motion.  Neurological: He is oriented to person, place, and time.  Skin: Skin is warm.   neurovascular status intact with no health history changes noted and painful thick nail disease 1-5 both feet with incurvation of the corners and lifted second toe right with keratotic lesion on the inner side of the second toe that is painful when pressed    Assessment:     Mycotic nail infection with pain 1-5 both feet and hammertoe deformity with keratotic lesion inner side second toe right    Plan:     H&P and x-ray reviewed with patient. Today I debrided nailbeds 1-5 both feet with no iatrogenic bleeding noted and debrided the lesion on the right second toe with minimal bleeding with bandage application and padding. I do not recommend digital correction and less we cannot get symptoms under control and I reviewed that with him he will reappoint 3 months earlier if necessary

## 2014-01-18 ENCOUNTER — Other Ambulatory Visit: Payer: Medicare Other | Admitting: Gastroenterology

## 2014-02-18 ENCOUNTER — Encounter: Payer: Medicare Other | Admitting: Gastroenterology

## 2014-04-05 ENCOUNTER — Ambulatory Visit (INDEPENDENT_AMBULATORY_CARE_PROVIDER_SITE_OTHER): Payer: Medicare Other | Admitting: Podiatry

## 2014-04-05 VITALS — Resp 16 | Ht 70.0 in | Wt 185.0 lb

## 2014-04-05 DIAGNOSIS — B351 Tinea unguium: Secondary | ICD-10-CM

## 2014-04-05 DIAGNOSIS — M79609 Pain in unspecified limb: Secondary | ICD-10-CM | POA: Diagnosis not present

## 2014-04-05 DIAGNOSIS — L84 Corns and callosities: Secondary | ICD-10-CM | POA: Diagnosis not present

## 2014-04-05 DIAGNOSIS — M779 Enthesopathy, unspecified: Secondary | ICD-10-CM

## 2014-04-05 MED ORDER — TRIAMCINOLONE ACETONIDE 10 MG/ML IJ SUSP
10.0000 mg | Freq: Once | INTRAMUSCULAR | Status: AC
  Administered 2014-04-05: 10 mg

## 2014-04-05 NOTE — Progress Notes (Signed)
Subjective:     Patient ID: Devin Garza, male   DOB: October 27, 1941, 73 y.o.   MRN: [id number]  HPI patient presents with painful lesions second toe right foot that's inflamed and sore and also with nail disease 1-5 of both feet that are painful with inability for the patient to cut   Review of Systems     Objective:   Physical Exam Neurovascular status is intact with a thick inflamed keratotic lesion distal medial second toe right with fluid buildup around the interphalangeal joint and nail disease 1-5 both feet    Assessment:     Mycotic nail infection with pain and inflamed capsule right second toe along with keratotic lesion    Plan:     Debris the lesion and injected 2 mg dexamethasone Kenalog 2 mg Xylocaine and debridement nailbeds 1-5 both feet with no bleeding noted. Reappoint her recheck

## 2014-06-10 DIAGNOSIS — L578 Other skin changes due to chronic exposure to nonionizing radiation: Secondary | ICD-10-CM | POA: Diagnosis not present

## 2014-06-10 DIAGNOSIS — L821 Other seborrheic keratosis: Secondary | ICD-10-CM | POA: Diagnosis not present

## 2014-06-10 DIAGNOSIS — D239 Other benign neoplasm of skin, unspecified: Secondary | ICD-10-CM | POA: Diagnosis not present

## 2014-06-10 DIAGNOSIS — L570 Actinic keratosis: Secondary | ICD-10-CM | POA: Diagnosis not present

## 2014-06-10 DIAGNOSIS — Z85828 Personal history of other malignant neoplasm of skin: Secondary | ICD-10-CM | POA: Diagnosis not present

## 2014-07-19 ENCOUNTER — Other Ambulatory Visit: Payer: Self-pay | Admitting: Family Medicine

## 2014-08-01 DIAGNOSIS — H35379 Puckering of macula, unspecified eye: Secondary | ICD-10-CM | POA: Diagnosis not present

## 2014-08-01 DIAGNOSIS — H2510 Age-related nuclear cataract, unspecified eye: Secondary | ICD-10-CM | POA: Diagnosis not present

## 2014-08-02 ENCOUNTER — Ambulatory Visit (INDEPENDENT_AMBULATORY_CARE_PROVIDER_SITE_OTHER): Payer: Medicare Other | Admitting: Podiatry

## 2014-08-02 ENCOUNTER — Encounter: Payer: Self-pay | Admitting: Podiatry

## 2014-08-02 DIAGNOSIS — M79676 Pain in unspecified toe(s): Secondary | ICD-10-CM

## 2014-08-02 DIAGNOSIS — L84 Corns and callosities: Secondary | ICD-10-CM | POA: Diagnosis not present

## 2014-08-02 DIAGNOSIS — M79609 Pain in unspecified limb: Secondary | ICD-10-CM

## 2014-08-02 DIAGNOSIS — B351 Tinea unguium: Secondary | ICD-10-CM

## 2014-08-02 NOTE — Progress Notes (Signed)
Subjective:     Patient ID: Devin Garza, male   DOB: 1941-09-27, 73 y.o.   MRN: [id number]  HPI patient presents with painful nailbeds 1-5 both feet that he cannot take care of   Review of Systems     Objective:   Physical Exam Neurovascular status unchanged with thick yellow brittle nailbeds 1-5 both feet that are painful    Assessment:     Mycotic infection of nailbeds 1-5 both feet with no iatrogenic bleeding noted    Plan:     Debris painful nailbeds 1-5 both feet with no iatrogenic bleeding noted

## 2014-09-27 DIAGNOSIS — Z23 Encounter for immunization: Secondary | ICD-10-CM | POA: Diagnosis not present

## 2014-11-01 ENCOUNTER — Other Ambulatory Visit: Payer: Medicare Other

## 2014-11-08 ENCOUNTER — Ambulatory Visit (INDEPENDENT_AMBULATORY_CARE_PROVIDER_SITE_OTHER): Payer: Medicare Other | Admitting: Podiatry

## 2014-11-08 DIAGNOSIS — B351 Tinea unguium: Secondary | ICD-10-CM | POA: Diagnosis not present

## 2014-11-08 DIAGNOSIS — M79676 Pain in unspecified toe(s): Secondary | ICD-10-CM

## 2014-11-08 NOTE — Progress Notes (Signed)
Subjective:  °  ° Patient ID: Devin Garza, male   DOB: 01/20/1941, 73 y.o.   MRN: [id number] ° °HPI patient presents with painful nailbeds 1-5 both feet that he cannot take care of ° ° °Review of Systems ° °   °Objective:  ° Physical Exam °Neurovascular status unchanged with thick yellow brittle nailbeds 1-5 both feet that are painful °   °Assessment:  °   °Mycotic infection of nailbeds 1-5 both feet with no iatrogenic bleeding noted °   °Plan:  °   °Debris painful nailbeds 1-5 both feet with no iatrogenic bleeding noted °   ° ° °

## 2014-12-02 DIAGNOSIS — L814 Other melanin hyperpigmentation: Secondary | ICD-10-CM | POA: Diagnosis not present

## 2014-12-02 DIAGNOSIS — L821 Other seborrheic keratosis: Secondary | ICD-10-CM | POA: Diagnosis not present

## 2014-12-02 DIAGNOSIS — Z1283 Encounter for screening for malignant neoplasm of skin: Secondary | ICD-10-CM | POA: Diagnosis not present

## 2014-12-02 DIAGNOSIS — L578 Other skin changes due to chronic exposure to nonionizing radiation: Secondary | ICD-10-CM | POA: Diagnosis not present

## 2014-12-02 DIAGNOSIS — Z85828 Personal history of other malignant neoplasm of skin: Secondary | ICD-10-CM | POA: Diagnosis not present

## 2014-12-02 DIAGNOSIS — L570 Actinic keratosis: Secondary | ICD-10-CM | POA: Diagnosis not present

## 2014-12-02 DIAGNOSIS — D1800 Hemangioma unspecified site: Secondary | ICD-10-CM | POA: Diagnosis not present

## 2015-01-04 ENCOUNTER — Other Ambulatory Visit: Payer: Self-pay | Admitting: Family Medicine

## 2015-01-05 NOTE — Telephone Encounter (Signed)
Last office visit 11/14/2013. CPE scheduled for 03/2015.  Last refilled 11/14/2013 for #30 with 4 refills.  Ok to refill?

## 2015-01-06 NOTE — Telephone Encounter (Signed)
Called to [facility]. 

## 2015-01-06 NOTE — Telephone Encounter (Signed)
Ok to refill 30, 3 ref  If they are out, ok to refill 5 mg, 2 tabs po qhs, #60, 3 ref

## 2015-01-09 ENCOUNTER — Other Ambulatory Visit: Payer: Self-pay | Admitting: Radiology

## 2015-01-09 DIAGNOSIS — I6523 Occlusion and stenosis of bilateral carotid arteries: Secondary | ICD-10-CM

## 2015-01-18 ENCOUNTER — Other Ambulatory Visit: Payer: Self-pay | Admitting: Family Medicine

## 2015-02-03 ENCOUNTER — Encounter (INDEPENDENT_AMBULATORY_CARE_PROVIDER_SITE_OTHER): Payer: Medicare Other

## 2015-02-03 DIAGNOSIS — I6523 Occlusion and stenosis of bilateral carotid arteries: Secondary | ICD-10-CM | POA: Diagnosis not present

## 2015-02-07 ENCOUNTER — Other Ambulatory Visit: Payer: Medicare Other

## 2015-02-21 ENCOUNTER — Ambulatory Visit (INDEPENDENT_AMBULATORY_CARE_PROVIDER_SITE_OTHER): Payer: Medicare Other | Admitting: Podiatry

## 2015-02-21 DIAGNOSIS — B351 Tinea unguium: Secondary | ICD-10-CM

## 2015-02-21 DIAGNOSIS — M79676 Pain in unspecified toe(s): Secondary | ICD-10-CM | POA: Diagnosis not present

## 2015-02-21 NOTE — Progress Notes (Signed)
Subjective:     Patient ID: Devin Garza, male   DOB: March 05, 1941, 74 y.o.   MRN: [id number]  HPI patient presents with painful nailbeds 1-5 both feet that he cannot take care of   Review of Systems     Objective:   Physical Exam Neurovascular status unchanged with thick yellow brittle nailbeds 1-5 both feet that are painful    Assessment:     Mycotic infection of nailbeds 1-5 both feet with no iatrogenic bleeding noted    Plan:     Debris painful nailbeds 1-5 both feet with no iatrogenic bleeding noted

## 2015-04-18 ENCOUNTER — Other Ambulatory Visit (INDEPENDENT_AMBULATORY_CARE_PROVIDER_SITE_OTHER): Payer: Medicare Other

## 2015-04-18 ENCOUNTER — Telehealth: Payer: Self-pay

## 2015-04-18 DIAGNOSIS — R7303 Prediabetes: Secondary | ICD-10-CM

## 2015-04-18 DIAGNOSIS — R7309 Other abnormal glucose: Secondary | ICD-10-CM

## 2015-04-18 DIAGNOSIS — D649 Anemia, unspecified: Secondary | ICD-10-CM

## 2015-04-18 DIAGNOSIS — Z125 Encounter for screening for malignant neoplasm of prostate: Secondary | ICD-10-CM | POA: Diagnosis not present

## 2015-04-18 DIAGNOSIS — E785 Hyperlipidemia, unspecified: Secondary | ICD-10-CM

## 2015-04-18 LAB — CBC WITH DIFFERENTIAL/PLATELET
Basophils Absolute: 0 10*3/uL (ref 0.0–0.1)
Basophils Relative: 0.4 % (ref 0.0–3.0)
EOS PCT: 8.8 % — AB (ref 0.0–5.0)
Eosinophils Absolute: 0.5 10*3/uL (ref 0.0–0.7)
HCT: 36.8 % — ABNORMAL LOW (ref 39.0–52.0)
HEMOGLOBIN: 12.4 g/dL — AB (ref 13.0–17.0)
LYMPHS ABS: 1 10*3/uL (ref 0.7–4.0)
Lymphocytes Relative: 17.9 % (ref 12.0–46.0)
MCHC: 33.8 g/dL (ref 30.0–36.0)
MCV: 94 fl (ref 78.0–100.0)
MONO ABS: 0.8 10*3/uL (ref 0.1–1.0)
Monocytes Relative: 14.3 % — ABNORMAL HIGH (ref 3.0–12.0)
NEUTROS ABS: 3.2 10*3/uL (ref 1.4–7.7)
NEUTROS PCT: 58.6 % (ref 43.0–77.0)
Platelets: 240 10*3/uL (ref 150.0–400.0)
RBC: 3.91 Mil/uL — AB (ref 4.22–5.81)
RDW: 13.7 % (ref 11.5–15.5)
WBC: 5.5 10*3/uL (ref 4.0–10.5)

## 2015-04-18 LAB — COMPREHENSIVE METABOLIC PANEL
ALBUMIN: 4.3 g/dL (ref 3.5–5.2)
ALT: 51 U/L (ref 0–53)
AST: 48 U/L — AB (ref 0–37)
Alkaline Phosphatase: 92 U/L (ref 39–117)
BILIRUBIN TOTAL: 0.6 mg/dL (ref 0.2–1.2)
BUN: 32 mg/dL — ABNORMAL HIGH (ref 6–23)
CHLORIDE: 102 meq/L (ref 96–112)
CO2: 26 mEq/L (ref 19–32)
CREATININE: 1.36 mg/dL (ref 0.40–1.50)
Calcium: 9.7 mg/dL (ref 8.4–10.5)
GFR: 54.36 mL/min — AB (ref >60.00)
Glucose, Bld: 132 mg/dL — ABNORMAL HIGH (ref 70–99)
Potassium: 4.8 mEq/L (ref 3.5–5.1)
Sodium: 135 mEq/L (ref 135–145)
TOTAL PROTEIN: 7.2 g/dL (ref 6.0–8.3)

## 2015-04-18 LAB — LIPID PANEL
CHOLESTEROL: 162 mg/dL (ref 0–200)
HDL: 74.5 mg/dL (ref >39.00)
LDL Cholesterol: 73 mg/dL (ref 0–99)
NonHDL: 87.5
Total CHOL/HDL Ratio: 2
Triglycerides: 73 mg/dL (ref 0.0–149.0)
VLDL: 14.6 mg/dL (ref 0.0–40.0)

## 2015-04-18 LAB — PSA: PSA: 1.35 ng/mL (ref 0.10–4.00)

## 2015-04-18 LAB — HEMOGLOBIN A1C: Hgb A1c MFr Bld: 5.6 % (ref 4.6–6.5)

## 2015-04-18 NOTE — Telephone Encounter (Signed)
Pt came in to have labs drawn--there were no orders, please let us know what should be ordered  Thanks [name]

## 2015-04-18 NOTE — Telephone Encounter (Signed)
LAbs entered.

## 2015-04-18 NOTE — Addendum Note (Signed)
Addended by: Lurlean Nanny on: 04/18/2015 08:51 AM   Modules accepted: Orders

## 2015-04-23 ENCOUNTER — Ambulatory Visit (INDEPENDENT_AMBULATORY_CARE_PROVIDER_SITE_OTHER): Payer: Medicare Other | Admitting: Family Medicine

## 2015-04-23 ENCOUNTER — Encounter: Payer: Self-pay | Admitting: Family Medicine

## 2015-04-23 VITALS — BP 144/88 | HR 93 | Temp 98.2°F | Ht 68.25 in | Wt 187.0 lb

## 2015-04-23 DIAGNOSIS — Z0000 Encounter for general adult medical examination without abnormal findings: Secondary | ICD-10-CM

## 2015-04-23 DIAGNOSIS — Z23 Encounter for immunization: Secondary | ICD-10-CM | POA: Diagnosis not present

## 2015-04-23 DIAGNOSIS — Z1211 Encounter for screening for malignant neoplasm of colon: Secondary | ICD-10-CM

## 2015-04-23 MED ORDER — ATORVASTATIN CALCIUM 10 MG PO TABS: 10.0000 mg | ORAL_TABLET | Freq: Every day | ORAL | Status: DC

## 2015-04-23 MED ORDER — VALSARTAN-HYDROCHLOROTHIAZIDE 320-12.5 MG PO TABS: 1.0000 | ORAL_TABLET | Freq: Every day | ORAL | Status: DC

## 2015-04-23 MED ORDER — BUPROPION HCL ER (XL) 150 MG PO TB24: 150.0000 mg | ORAL_TABLET | Freq: Every day | ORAL | Status: DC

## 2015-04-23 MED ORDER — ESZOPICLONE 2 MG PO TABS: 2.0000 mg | ORAL_TABLET | Freq: Every evening | ORAL | Status: DC | PRN

## 2015-04-23 NOTE — Patient Instructions (Signed)

## 2015-04-23 NOTE — Progress Notes (Signed)
Dr. Frederico Hamman T. Alexine Pilant, MD, [facility] [place], [address] Phone: [phone] Fax: [phone]  04/23/2015  Patient: Devin Garza, MRN: [id number], DOB: 01/13/1941, 74 y.o.  Primary Physician:  Owens Loffler, MD  Chief Complaint: Annual Exam  Subjective:   Devin Garza is a 74 y.o. pleasant patient who presents for a medicare wellness examination:  Preventative Health Maintenance Visit:  Health Maintenance Summary Reviewed and updated, unless pt declines services.  Tobacco History Reviewed. Alcohol: 2 drinks a day now Exercise Habits: per below STD concerns: no risk or activity to increase risk Drug Use: None Encouraged self-testicular check  Health Maintenance  Topic Date Due  . PNA vac Low Risk Adult (2 of 2 - PCV13) 01/22/2012  . TETANUS/TDAP  12/27/2013  . INFLUENZA VACCINE  07/28/2015  . COLONOSCOPY  04/21/2020  . ZOSTAVAX  Completed    Immunization History  Administered Date(s) Administered  . Influenza Split 09/10/2012, 09/24/2013  . Pneumococcal Conjugate-13 04/23/2015  . Pneumococcal Polysaccharide-23 01/21/2011  . Td 12/28/2003  . Zoster 10/29/2013    Playing 3 times a week golf.  Walking 3 times a week for 45 minutes.   Anemia, he has failed to keep colonoscopy referrals in the past.  CBC:    Component Value Date/Time   WBC 5.5 04/18/2015 0907   HGB 12.4* 04/18/2015 0907   HCT 36.8* 04/18/2015 0907   PLT 240.0 04/18/2015 0907   MCV 94.0 04/18/2015 0907   NEUTROABS 3.2 04/18/2015 0907   LYMPHSABS 1.0 04/18/2015 0907   MONOABS 0.8 04/18/2015 0907   EOSABS 0.5 04/18/2015 0907   BASOSABS 0.0 04/18/2015 8416     Patient Active Problem List   Diagnosis Date Noted  . Tubular adenoma 11/15/2013  . Prediabetes 04/21/2011  . ALCOHOL ABUSE 01/21/2011  . TRANSAMINASES, SERUM, ELEVATED 01/21/2011  . FATIGUE 01/18/2011  . Anemia 11/12/2009  . FECAL OCCULT BLOOD 11/12/2009  .  CARCINOMA, BASAL CELL 09/03/2008  . CAROTID ARTERY STENOSIS, BILATERAL 08/28/2008  . INSOMNIA UNSPECIFIED 08/28/2008  . Hyperlipidemia 08/27/2008  . HYPERTENSION 08/27/2008   Past Medical History  Diagnosis Date  . Hypertension   . HLD (hyperlipidemia)   . Osteoarthritis   . Basal cell carcinoma of lip 2007  . ED (erectile dysfunction)   . Tobacco abuse    Past Surgical History  Procedure Laterality Date  . Skin cancer excision      LIP  . Ankle arthroscopy w/ open repair     History   Social History  . Marital Status: Divorced    Spouse Name: N/A  . Number of Children: 2  . Years of Education: N/A   Occupational History  .     Social History Main Topics  . Smoking status: Current Every Day Smoker -- 0.50 packs/day for 40 years    Types: Cigarettes  . Smokeless tobacco: Never Used     Comment: cigars occassionally  . Alcohol Use: 0.0 oz/week    0 Standard drinks or equivalent per week     Comment: daily  . Drug Use: No  . Sexual Activity: Not on file   Other Topics Concern  . Not on file   Social History Narrative   Family History  Problem Relation Age of Onset  . Cancer Mother 44    LUNG  . Cancer Father     THROAT  . Hearing loss Father     HEART ATTACK, CAD  . Cancer  Sister     OVARIAN   No Known Allergies  Medication list has been reviewed and updated.   General: Denies fever, chills, sweats. No significant weight loss. Eyes: Denies blurring,significant itching ENT: Denies earache, sore throat, and hoarseness. Cardiovascular: Denies chest pains, palpitations, dyspnea on exertion Respiratory: Denies cough, dyspnea at rest,wheeezing Breast: no concerns about lumps GI: Denies nausea, vomiting, diarrhea, constipation, change in bowel habits, abdominal pain, melena, hematochezia GU: Denies penile discharge, ED, urinary flow / outflow problems. No STD concerns. Musculoskeletal: Denies back pain, joint pain Derm: Denies rash, itching Neuro:  Denies  paresthesias, frequent falls, frequent headaches Psych: Denies depression, anxiety Endocrine: Denies cold intolerance, heat intolerance, polydipsia Heme: Denies enlarged lymph nodes Allergy: No hayfever  Objective:   BP 144/88 mmHg  Pulse 93  Temp(Src) 98.2 F (36.8 C) (Oral)  Ht 5' 8.25" (1.734 m)  Wt 187 lb (84.823 kg)  BMI 28.21 kg/m2  SpO2 98%  The patient completed a fall screen and PHQ-2 and PHQ-9 if necessary, which is documented in the EHR. The CMA/LPN/RN who assisted the patient verbally completed with them and documented results in [place] [facility].   Hearing Screening   125Hz  250Hz  500Hz  1000Hz  2000Hz  4000Hz  8000Hz   Right ear:   20 15 15  45   Left ear:   15 15 10  45   Vision Screening Comments: 06/2014--[facility]  GEN: well developed, well nourished, no acute distress Eyes: conjunctiva and lids normal, PERRLA, EOMI ENT: TM clear, nares clear, oral exam WNL Neck: supple, no lymphadenopathy, no thyromegaly, no JVD Pulm: clear to auscultation and percussion, respiratory effort normal CV: regular rate and rhythm, S1-S2, no murmur, rub or gallop, no bruits, peripheral pulses normal and symmetric, no cyanosis, clubbing, edema or varicosities GI: soft, non-tender; no hepatosplenomegaly, masses; active bowel sounds all quadrants GU: no hernia, testicular mass, penile discharge Lymph: no cervical, axillary or inguinal adenopathy MSK: gait normal, muscle tone and strength WNL, no joint swelling, effusions, discoloration, crepitus  SKIN: clear, good turgor, color WNL, no rashes, lesions, or ulcerations Neuro: normal mental status, normal strength, sensation, and motion Psych: alert; oriented to person, place and time, normally interactive and not anxious or depressed in appearance.  All labs reviewed with patient.  Lipids:    Component Value Date/Time   CHOL 162 04/18/2015 0907   TRIG 73.0 04/18/2015 0907   HDL 74.50 04/18/2015 0907   LDLDIRECT 132.0  01/18/2011 0914   VLDL 14.6 04/18/2015 0907   CHOLHDL 2 04/18/2015 0907   CBC: CBC Latest Ref Rng 04/18/2015 11/12/2013 01/18/2011  WBC 4.0 - 10.5 K/uL 5.5 6.4 6.0  Hemoglobin 13.0 - 17.0 g/dL 12.4(L) 12.4(L) 15.0  Hematocrit 39.0 - 52.0 % 36.8(L) 36.4(L) 43.7  Platelets 150.0 - 400.0 K/uL 240.0 219.0 680.3    Basic Metabolic Panel:    Component Value Date/Time   NA 135 04/18/2015 0907   K 4.8 04/18/2015 0907   CL 102 04/18/2015 0907   CO2 26 04/18/2015 0907   BUN 32* 04/18/2015 0907   CREATININE 1.36 04/18/2015 0907   GLUCOSE 132* 04/18/2015 0907   CALCIUM 9.7 04/18/2015 0907   Hepatic Function Latest Ref Rng 04/18/2015 11/12/2013 07/19/2012  Total Protein 6.0 - 8.3 g/dL 7.2 7.2 7.1  Albumin 3.5 - 5.2 g/dL 4.3 4.2 4.1  AST 0 - 37 U/L 48(H) 38(H) 65(H)  ALT 0 - 53 U/L 51 35 69(H)  Alk Phosphatase 39 - 117 U/L 92 44 43  Total Bilirubin 0.2 - 1.2 mg/dL  0.6 0.6 0.6  Bilirubin, Direct 0.0 - 0.3 mg/dL - 0.1 -    Lab Results  Component Value Date   TSH 1.35 01/18/2011   Lab Results  Component Value Date   PSA 1.35 04/18/2015   PSA 1.09 11/12/2013   PSA 1.10 01/18/2011    Assessment and Plan:   Routine general medical examination at a health care facility  Special screening for malignant neoplasms, colon - Plan: Ambulatory referral to Gastroenterology  Need for vaccination with 13-polyvalent pneumococcal conjugate vaccine - Plan: Pneumococcal conjugate vaccine 13-valent  Health Maintenance Exam: The patient's preventative maintenance and recommended screening tests for an annual wellness exam were reviewed in full today. Brought up to date unless services declined.  Counselled on the importance of diet, exercise, and its role in overall health and mortality. The patient's FH and SH was reviewed, including their home life, tobacco status, and drug and alcohol status.  I have personally reviewed the Medicare Annual Wellness questionnaire and have noted 1. The patient's  medical and social history 2. Their use of alcohol, tobacco or illicit drugs 3. Their current medications and supplements 4. The patient's functional ability including ADL's, fall risks, home safety risks and hearing or visual             impairment. 5. Diet and physical activities 6. Evidence for depression or mood disorders  The patients weight, height, BMI and visual acuity have been recorded in the chart I have made referrals, counseling and provided education to the patient based review of the above and I have provided the pt with a written personalized care plan for preventive services.  I have provided the patient with a copy of your personalized plan for preventive services. Instructed to take the time to review along with their updated medication list.  Feeling well. Anemia - refer again for colon  Follow-up: No Follow-up on file. Or follow-up in 1 year for complete physical examination  New Prescriptions   ESZOPICLONE (LUNESTA) 2 MG TABS TABLET    Take 1 tablet (2 mg total) by mouth at bedtime as needed for sleep. Take immediately before bedtime   Orders Placed This Encounter  Procedures  . Pneumococcal conjugate vaccine 13-valent  . Ambulatory referral to Gastroenterology    Signed,  Frederico Hamman T. Cecily Lawhorne, MD   Patient's Medications  New Prescriptions   ESZOPICLONE (LUNESTA) 2 MG TABS TABLET    Take 1 tablet (2 mg total) by mouth at bedtime as needed for sleep. Take immediately before bedtime  Previous Medications   CINNAMON PO    Take by mouth daily.   MULTIPLE VITAMIN (MULTIVITAMIN) TABLET    Take 1 tablet by mouth daily.   VITAMIN C (ASCORBIC ACID) 500 MG TABLET    Take 500 mg by mouth daily.  Modified Medications   Modified Medication Previous Medication   ATORVASTATIN (LIPITOR) 10 MG TABLET atorvastatin (LIPITOR) 10 MG tablet      Take 1 tablet (10 mg total) by mouth daily.    TAKE 1 TABLET DAILY   BUPROPION (WELLBUTRIN XL) 150 MG 24 HR TABLET buPROPion  (WELLBUTRIN XL) 150 MG 24 hr tablet      Take 1 tablet (150 mg total) by mouth daily.    TAKE 1 TABLET DAILY   VALSARTAN-HYDROCHLOROTHIAZIDE (DIOVAN HCT) 320-12.5 MG PER TABLET DIOVAN HCT 320-12.5 MG per tablet      Take 1 tablet by mouth daily.    TAKE 1 TABLET DAILY  Discontinued Medications   ZOLPIDEM (AMBIEN)  10 MG TABLET    TAKE 1 TABLET BY MOUTH AT BEDTIME AS NEEDED

## 2015-04-23 NOTE — Progress Notes (Signed)
Pre visit review using our clinic review tool, if applicable. No additional management support is needed unless otherwise documented below in the visit note. 

## 2015-04-30 DIAGNOSIS — L570 Actinic keratosis: Secondary | ICD-10-CM | POA: Diagnosis not present

## 2015-04-30 DIAGNOSIS — Z85828 Personal history of other malignant neoplasm of skin: Secondary | ICD-10-CM | POA: Diagnosis not present

## 2015-04-30 DIAGNOSIS — L821 Other seborrheic keratosis: Secondary | ICD-10-CM | POA: Diagnosis not present

## 2015-04-30 DIAGNOSIS — L814 Other melanin hyperpigmentation: Secondary | ICD-10-CM | POA: Diagnosis not present

## 2015-04-30 DIAGNOSIS — L578 Other skin changes due to chronic exposure to nonionizing radiation: Secondary | ICD-10-CM | POA: Diagnosis not present

## 2015-04-30 DIAGNOSIS — D485 Neoplasm of uncertain behavior of skin: Secondary | ICD-10-CM | POA: Diagnosis not present

## 2015-04-30 DIAGNOSIS — D229 Melanocytic nevi, unspecified: Secondary | ICD-10-CM | POA: Diagnosis not present

## 2015-04-30 DIAGNOSIS — D692 Other nonthrombocytopenic purpura: Secondary | ICD-10-CM | POA: Diagnosis not present

## 2015-05-08 ENCOUNTER — Ambulatory Visit: Payer: Medicare Other

## 2015-05-19 ENCOUNTER — Ambulatory Visit (INDEPENDENT_AMBULATORY_CARE_PROVIDER_SITE_OTHER): Payer: Medicare Other | Admitting: Podiatry

## 2015-05-19 DIAGNOSIS — M79676 Pain in unspecified toe(s): Secondary | ICD-10-CM | POA: Diagnosis not present

## 2015-05-19 DIAGNOSIS — B351 Tinea unguium: Secondary | ICD-10-CM | POA: Diagnosis not present

## 2015-05-19 NOTE — Progress Notes (Signed)
Subjective:     Patient ID: Devin Garza, male   DOB: Aug 01, 1941, 74 y.o.   MRN: [id number]  HPI patient presents with painful nailbeds 1-5 both feet that he cannot take care of   Review of Systems     Objective:   Physical Exam Neurovascular status unchanged with thick yellow brittle nailbeds 1-5 both feet that are painful    Assessment:     Mycotic infection of nailbeds 1-5 both feet with no iatrogenic bleeding noted    Plan:     Debris painful nailbeds 1-5 both feet with no iatrogenic bleeding noted

## 2015-05-23 ENCOUNTER — Ambulatory Visit: Payer: Medicare Other

## 2015-05-28 ENCOUNTER — Ambulatory Visit (INDEPENDENT_AMBULATORY_CARE_PROVIDER_SITE_OTHER): Payer: Medicare Other | Admitting: Urgent Care

## 2015-05-28 ENCOUNTER — Telehealth: Payer: Self-pay | Admitting: Urgent Care

## 2015-05-28 ENCOUNTER — Ambulatory Visit: Payer: Self-pay | Admitting: Urgent Care

## 2015-05-28 ENCOUNTER — Encounter: Payer: Self-pay | Admitting: Urgent Care

## 2015-05-28 VITALS — BP 153/75 | HR 69 | Temp 99.0°F

## 2015-05-28 DIAGNOSIS — D649 Anemia, unspecified: Secondary | ICD-10-CM | POA: Diagnosis not present

## 2015-05-28 DIAGNOSIS — K219 Gastro-esophageal reflux disease without esophagitis: Secondary | ICD-10-CM | POA: Diagnosis not present

## 2015-05-28 DIAGNOSIS — D126 Benign neoplasm of colon, unspecified: Secondary | ICD-10-CM | POA: Diagnosis not present

## 2015-05-28 MED ORDER — ESOMEPRAZOLE MAGNESIUM 40 MG PO CPDR: 40.0000 mg | DELAYED_RELEASE_CAPSULE | Freq: Every day | ORAL | Status: DC

## 2015-05-28 MED ORDER — NA SULFATE-K SULFATE-MG SULF 17.5-3.13-1.6 GM/177ML PO SOLN: 1.0000 | ORAL | Status: DC

## 2015-05-28 NOTE — Progress Notes (Signed)
Gastroenterology Consultation  Referring Provider:     Owens Loffler, MD Primary Care Physician:  Owens Loffler, MD Primary Gastroenterologist:  Dr. Allen Norris     Reason for Consultation:     Anemia        HPI:   Devin Garza is a 74 y.o. y/o male referred for consultation & management of anemia by Dr. Owens Loffler, MD.  He has hgb 12.4.  He states he had EGD 5 years ago in [place] by Dr Ardis Hughs.  There was no evidence of h pylori or celiac disease.  He had chronic NSAID-induced gastritis.  He reports last colonoscopy was in [place] in 2006 & he has a tubular adenoma.  He never followup up for 5 year surveillance.  He has been on Nexium for years but stopped in December.  He is having heartburn & using TUMS & zantac PRN.  Both seem to help.  Denies constipation, diarrhea, rectal bleeding, melena or weight loss.  Denies easy bruising or bleeding.  Denies chest pain, palpitations, or SOB.  Denies abdominal pain. Denies hematuria.  He takes IBU 200mg   2-4 per week when golfing due to shoulder pain.  Denies other NSAIDS.  He tells me he has had anemia for years now.  No hx blood transfusions.  Component     Latest Ref Rng 11/12/2013 04/18/2015  WBC     4.0 - 10.5 K/uL 6.4 5.5  RBC     4.22 - 5.81 Mil/uL 3.77 (L) 3.91 (L)  Hemoglobin     13.0 - 17.0 g/dL 12.4 (L) 12.4 (L)  HCT     39.0 - 52.0 % 36.4 (L) 36.8 (L)   Past Medical History  Diagnosis Date  . Hypertension   . HLD (hyperlipidemia)   . Osteoarthritis   . Basal cell carcinoma of lip 2007  . ED (erectile dysfunction)   . Tobacco abuse   . Anemia   . Tubular adenoma of colon 2005    Past Surgical History  Procedure Laterality Date  . Skin cancer excision      LIP  . Ankle arthroscopy w/ open repair    . Colonoscopy  2006    [place]-Tubular adenoma  . Upper gi endoscopy  12/2009    Dr. Ivory Broad gastritisl negative for H. pylori; duodenal biopsies neg for sprue    Prior to Admission medications   Medication Sig  Start Date End Date Taking? Authorizing Provider  atorvastatin (LIPITOR) 10 MG tablet Take 1 tablet (10 mg total) by mouth daily. 04/23/15   Owens Loffler, MD  buPROPion (WELLBUTRIN XL) 150 MG 24 hr tablet Take 1 tablet (150 mg total) by mouth daily. 04/23/15   Owens Loffler, MD  CINNAMON PO Take by mouth daily.    Historical Provider, MD  eszopiclone (LUNESTA) 2 MG TABS tablet Take 1 tablet (2 mg total) by mouth at bedtime as needed for sleep. Take immediately before bedtime 04/23/15   Owens Loffler, MD  Multiple Vitamin (MULTIVITAMIN) tablet Take 1 tablet by mouth daily.    Historical Provider, MD  valsartan-hydrochlorothiazide (DIOVAN HCT) 320-12.5 MG per tablet Take 1 tablet by mouth daily. 04/23/15   Owens Loffler, MD  vitamin C (ASCORBIC ACID) 500 MG tablet Take 500 mg by mouth daily.    Historical Provider, MD    Family History  Problem Relation Age of Onset  . Cancer Mother 64    LUNG  . Cancer Father     THROAT  . Hearing loss Father  HEART ATTACK, CAD  . Cancer Sister     OVARIAN   There is no known family history of colorectal carcinoma , liver disease, or inflammatory bowel disease.  Social History   Occupational History  . retired [employer]    Social History Main Topics  . Smoking status: Current Every Day Smoker -- 0.50 packs/day for 40 years    Types: Cigarettes  . Smokeless tobacco: Never Used     Comment: cigars occassionally  . Alcohol Use: 1.2 oz/week    2 Standard drinks or equivalent per week     Comment: 2 drinks whiskey daily  . Drug Use: No  . Sexual Activity: Not on file   Allergies as of 05/28/2015  . (No Known Allergies)   Review of Systems:    All systems reviewed and negative except where noted in HPI.   Physical Exam:  BP 153/75 mmHg  Pulse 69  Temp(Src) 99 F (37.2 C) (Oral) No LMP for male patient. Psych:  Alert and cooperative. Normal mood and affect. General:   Alert,  Well-developed, well-nourished, pleasant and  cooperative in NAD Head:  Normocephalic and atraumatic. Eyes:  Sclera clear, no icterus.   Conjunctiva pink. Ears:  Normal auditory acuity. Nose:  No deformity, discharge, or lesions. Mouth:  No deformity or lesions,oropharynx pink & moist. Neck:  Supple; no masses or thyromegaly. Lungs:  Respirations even and unlabored.  Clear throughout to auscultation.   No wheezes, crackles, or rhonchi. No acute distress. Heart:  Regular rate and rhythm; no murmurs, clicks, rubs, or gallops. Abdomen:  Normal bowel sounds.  No bruits.  Soft, non-tender and non-distended without masses, hepatosplenomegaly or hernias noted.  No guarding or rebound tenderness.   Rectal:  Deferred.  Msk:  Symmetrical without gross deformities.  Good, equal movement & strength bilaterally. Pulses:  Normal pulses noted. Extremities:  No clubbing or edema.  No cyanosis. Neurologic:  Alert and oriented x3;  grossly normal neurologically. Skin:  Intact without significant lesions or rashes.  No jaundice. Lymph Nodes:  No significant cervical adenopathy. Psych:  Alert and cooperative. Normal mood and affect.

## 2015-05-28 NOTE — Assessment & Plan Note (Deleted)
Colonoscopy as planned.  

## 2015-05-28 NOTE — Assessment & Plan Note (Signed)
Resume Nexium 40mg  daily EGD as planned

## 2015-05-28 NOTE — Telephone Encounter (Signed)
Please call patient.  He should go to [facility] lab to have a ferritin drawn. Thanks

## 2015-05-28 NOTE — Patient Instructions (Addendum)
Colonoscopy and EGD with Dr. Allen Norris Begin Nexium 40 mg daily for acid reflux  Patient given printed documentation of EGD/Colonoscopy scheduled for 07/04/15 and Prep for procedures.

## 2015-05-28 NOTE — Assessment & Plan Note (Signed)
Colonoscopy as planned.  

## 2015-05-28 NOTE — Assessment & Plan Note (Addendum)
Devin Garza is a pleasant 74 y.o. male with chronic anemia.  He has history of chronic gastritis, GERD, moderate ETOH use & tubular adenoma of the colon back in 2006.  Colonoscopy with Dr Allen Norris to try & determine culprit of his anemia.  GI Differentials include colorectal carcinoma or polyp,gastritis, PUD, or possible small bowel source.  He was negative for H pylori & celiac on biopsies in 2011 by Dr Ardis Hughs.   I have discussed risks & benefits which include, but are not limited to, bleeding, infection, perforation & drug reaction. The patient agrees with this plan & written consent will be obtained.  Will check ferritin He has been advised to decrease his ETOH consumption as this can lead to gastritis  We would like to thank you for the opportunity to participate in the care of Devin Garza.

## 2015-05-29 ENCOUNTER — Ambulatory Visit: Payer: Self-pay | Admitting: Urgent Care

## 2015-05-29 ENCOUNTER — Other Ambulatory Visit: Payer: Self-pay

## 2015-05-29 DIAGNOSIS — D509 Iron deficiency anemia, unspecified: Secondary | ICD-10-CM

## 2015-05-29 NOTE — Telephone Encounter (Signed)
Patient called me back. I informed patient that Devin Garza wanted him to have his Ferritin drawn. Patient agreed on going to the [facility] and have this drawn.

## 2015-05-29 NOTE — Telephone Encounter (Signed)
This encounter was created in error - please disregard.

## 2015-05-29 NOTE — Telephone Encounter (Signed)
Call placed to patient. Left message to return call.

## 2015-06-04 ENCOUNTER — Other Ambulatory Visit
Admission: RE | Admit: 2015-06-04 | Discharge: 2015-06-04 | Disposition: A | Discharge status: Home / Self Care | Payer: Medicare Other | Source: Ambulatory Visit | Attending: Urgent Care | Admitting: Urgent Care

## 2015-06-04 ENCOUNTER — Encounter: Payer: Self-pay | Admitting: Urgent Care

## 2015-06-04 DIAGNOSIS — D649 Anemia, unspecified: Secondary | ICD-10-CM | POA: Insufficient documentation

## 2015-06-04 LAB — FERRITIN: Ferritin: 48 ng/mL (ref 24–336)

## 2015-07-02 ENCOUNTER — Other Ambulatory Visit: Payer: Self-pay

## 2015-07-02 ENCOUNTER — Telehealth: Payer: Self-pay | Admitting: Gastroenterology

## 2015-07-02 DIAGNOSIS — D649 Anemia, unspecified: Secondary | ICD-10-CM

## 2015-07-02 MED ORDER — NA SULFATE-K SULFATE-MG SULF 17.5-3.13-1.6 GM/177ML PO SOLN: 1.0000 | ORAL | Status: DC

## 2015-07-02 NOTE — Telephone Encounter (Signed)
Needs prep called in to [place] [phone]

## 2015-07-02 NOTE — Telephone Encounter (Signed)
Rx faxed to [facility] [place] per pt request.

## 2015-07-15 ENCOUNTER — Encounter: Payer: Self-pay | Admitting: *Deleted

## 2015-07-18 NOTE — Discharge Instructions (Signed)

## 2015-07-23 ENCOUNTER — Ambulatory Visit
Admission: RE | Admit: 2015-07-23 | Discharge: 2015-07-23 | Disposition: A | Discharge status: Home / Self Care | Payer: Medicare Other | Source: Ambulatory Visit | Attending: Gastroenterology | Admitting: Gastroenterology

## 2015-07-23 ENCOUNTER — Encounter: Payer: Self-pay | Admitting: *Deleted

## 2015-07-23 ENCOUNTER — Ambulatory Visit: Payer: Medicare Other | Admitting: Student in an Organized Health Care Education/Training Program

## 2015-07-23 ENCOUNTER — Encounter
Admission: RE | Disposition: A | Discharge status: Home / Self Care | Payer: Self-pay | Source: Ambulatory Visit | Attending: Gastroenterology

## 2015-07-23 ENCOUNTER — Other Ambulatory Visit: Payer: Self-pay | Admitting: Gastroenterology

## 2015-07-23 DIAGNOSIS — Z8249 Family history of ischemic heart disease and other diseases of the circulatory system: Secondary | ICD-10-CM | POA: Diagnosis not present

## 2015-07-23 DIAGNOSIS — K2970 Gastritis, unspecified, without bleeding: Secondary | ICD-10-CM | POA: Insufficient documentation

## 2015-07-23 DIAGNOSIS — D123 Benign neoplasm of transverse colon: Secondary | ICD-10-CM | POA: Diagnosis not present

## 2015-07-23 DIAGNOSIS — Z8041 Family history of malignant neoplasm of ovary: Secondary | ICD-10-CM | POA: Diagnosis not present

## 2015-07-23 DIAGNOSIS — K640 First degree hemorrhoids: Secondary | ICD-10-CM | POA: Diagnosis not present

## 2015-07-23 DIAGNOSIS — K2950 Unspecified chronic gastritis without bleeding: Secondary | ICD-10-CM | POA: Diagnosis not present

## 2015-07-23 DIAGNOSIS — Z800 Family history of malignant neoplasm of digestive organs: Secondary | ICD-10-CM | POA: Diagnosis not present

## 2015-07-23 DIAGNOSIS — D509 Iron deficiency anemia, unspecified: Secondary | ICD-10-CM | POA: Diagnosis not present

## 2015-07-23 DIAGNOSIS — I10 Essential (primary) hypertension: Secondary | ICD-10-CM | POA: Insufficient documentation

## 2015-07-23 DIAGNOSIS — M1990 Unspecified osteoarthritis, unspecified site: Secondary | ICD-10-CM | POA: Insufficient documentation

## 2015-07-23 DIAGNOSIS — Z85819 Personal history of malignant neoplasm of unspecified site of lip, oral cavity, and pharynx: Secondary | ICD-10-CM | POA: Insufficient documentation

## 2015-07-23 DIAGNOSIS — Z801 Family history of malignant neoplasm of trachea, bronchus and lung: Secondary | ICD-10-CM | POA: Diagnosis not present

## 2015-07-23 DIAGNOSIS — K3189 Other diseases of stomach and duodenum: Secondary | ICD-10-CM | POA: Insufficient documentation

## 2015-07-23 DIAGNOSIS — K31819 Angiodysplasia of stomach and duodenum without bleeding: Secondary | ICD-10-CM | POA: Diagnosis not present

## 2015-07-23 DIAGNOSIS — K317 Polyp of stomach and duodenum: Secondary | ICD-10-CM | POA: Diagnosis not present

## 2015-07-23 DIAGNOSIS — F17290 Nicotine dependence, other tobacco product, uncomplicated: Secondary | ICD-10-CM | POA: Diagnosis not present

## 2015-07-23 DIAGNOSIS — D124 Benign neoplasm of descending colon: Secondary | ICD-10-CM | POA: Insufficient documentation

## 2015-07-23 DIAGNOSIS — E785 Hyperlipidemia, unspecified: Secondary | ICD-10-CM | POA: Diagnosis not present

## 2015-07-23 DIAGNOSIS — D649 Anemia, unspecified: Secondary | ICD-10-CM | POA: Diagnosis not present

## 2015-07-23 DIAGNOSIS — Z79899 Other long term (current) drug therapy: Secondary | ICD-10-CM | POA: Diagnosis not present

## 2015-07-23 DIAGNOSIS — F17210 Nicotine dependence, cigarettes, uncomplicated: Secondary | ICD-10-CM | POA: Insufficient documentation

## 2015-07-23 DIAGNOSIS — K648 Other hemorrhoids: Secondary | ICD-10-CM | POA: Diagnosis not present

## 2015-07-23 DIAGNOSIS — Z86010 Personal history of colonic polyps: Secondary | ICD-10-CM | POA: Diagnosis not present

## 2015-07-23 DIAGNOSIS — D132 Benign neoplasm of duodenum: Secondary | ICD-10-CM | POA: Diagnosis not present

## 2015-07-23 DIAGNOSIS — D126 Benign neoplasm of colon, unspecified: Secondary | ICD-10-CM | POA: Diagnosis not present

## 2015-07-23 HISTORY — DX: Presence of dental prosthetic device (complete) (partial): Z97.2

## 2015-07-23 HISTORY — PX: COLONOSCOPY: SHX5424

## 2015-07-23 HISTORY — PX: ESOPHAGOGASTRODUODENOSCOPY: SHX5428

## 2015-07-23 SURGERY — COLONOSCOPY
Anesthesia: Monitor Anesthesia Care | Wound class: Contaminated

## 2015-07-23 MED ORDER — PHENYLEPHRINE HCL 10 MG/ML IJ SOLN
INTRAMUSCULAR | Status: DC | PRN
  Administered 2015-07-23: 100 ug via INTRAVENOUS

## 2015-07-23 MED ORDER — LIDOCAINE HCL (CARDIAC) 20 MG/ML IV SOLN
INTRAVENOUS | Status: DC | PRN
  Administered 2015-07-23: 30 mg via INTRAVENOUS

## 2015-07-23 MED ORDER — SIMETHICONE 40 MG/0.6ML PO SUSP
ORAL | Status: DC | PRN
  Administered 2015-07-23: 09:00:00

## 2015-07-23 MED ORDER — LACTATED RINGERS IV SOLN
INTRAVENOUS | Status: DC
  Administered 2015-07-23 (×2): via INTRAVENOUS

## 2015-07-23 MED ORDER — PROPOFOL 10 MG/ML IV BOLUS
INTRAVENOUS | Status: DC | PRN
Start: 2015-07-23 — End: 2015-07-23
  Administered 2015-07-23 (×5): 20 mg via INTRAVENOUS

## 2015-07-23 SURGICAL SUPPLY — 39 items
BALLN DILATOR 10-12 8 (BALLOONS)
BALLN DILATOR 12-15 8 (BALLOONS)
BALLN DILATOR 15-18 8 (BALLOONS)
BALLN DILATOR CRE 0-12 8 (BALLOONS)
BALLN DILATOR ESOPH 8 10 CRE (MISCELLANEOUS) IMPLANT
BALLOON DILATOR 12-15 8 (BALLOONS) IMPLANT
BALLOON DILATOR 15-18 8 (BALLOONS) IMPLANT
BALLOON DILATOR CRE 0-12 8 (BALLOONS) IMPLANT
BLOCK BITE 60FR ADLT L/F GRN (MISCELLANEOUS) ×3 IMPLANT
CANISTER SUCT 1200ML W/VALVE (MISCELLANEOUS) ×3 IMPLANT
FCP ESCP3.2XJMB 240X2.8X (MISCELLANEOUS)
FORCEPS BIOP RAD 4 LRG CAP 4 (CUTTING FORCEPS) ×3 IMPLANT
FORCEPS BIOP RJ4 240 W/NDL (MISCELLANEOUS)
FORCEPS ESCP3.2XJMB 240X2.8X (MISCELLANEOUS) IMPLANT
GOWN CVR UNV OPN BCK APRN NK (MISCELLANEOUS) ×2 IMPLANT
GOWN ISOL THUMB LOOP REG UNIV (MISCELLANEOUS) ×4
HEMOCLIP INSTINCT (CLIP) IMPLANT
INJECTOR VARIJECT VIN23 (MISCELLANEOUS) IMPLANT
KIT CO2 TUBING (TUBING) IMPLANT
KIT DEFENDO VALVE AND CONN (KITS) IMPLANT
KIT ENDO PROCEDURE OLY (KITS) ×3 IMPLANT
LIGATOR MULTIBAND 6SHOOTER MBL (MISCELLANEOUS) IMPLANT
MARKER SPOT ENDO TATTOO 5ML (MISCELLANEOUS) IMPLANT
PAD GROUND ADULT SPLIT (MISCELLANEOUS) ×3 IMPLANT
SNARE SHORT THROW 13M SML OVAL (MISCELLANEOUS) ×3 IMPLANT
SNARE SHORT THROW 30M LRG OVAL (MISCELLANEOUS) IMPLANT
SPOT EX ENDOSCOPIC TATTOO (MISCELLANEOUS)
SUCTION POLY TRAP 4CHAMBER (MISCELLANEOUS) IMPLANT
SYR INFLATION 60ML (SYRINGE) IMPLANT
TRAP SUCTION POLY (MISCELLANEOUS) ×3 IMPLANT
TUBING CONN 6MMX3.1M (TUBING)
TUBING SUCTION CONN 0.25 STRL (TUBING) IMPLANT
UNDERPAD 30X60 958B10 (PK) (MISCELLANEOUS) IMPLANT
VALVE BIOPSY ENDO (VALVE) IMPLANT
VARIJECT INJECTOR VIN23 (MISCELLANEOUS)
WATER AUXILLARY (MISCELLANEOUS) IMPLANT
WATER STERILE IRR 250ML POUR (IV SOLUTION) ×3 IMPLANT
WATER STERILE IRR 500ML POUR (IV SOLUTION) IMPLANT
WIRE CRE 18-20MM 8CM F G (MISCELLANEOUS) IMPLANT

## 2015-07-23 NOTE — Op Note (Signed)
[facility] Patient Name: Devin Garza Procedure Date: 07/23/2015 8:07 AM MRN: [id number] Account #: [id number] Date of Birth: May 16, 1941 Admit Type: Outpatient Age: 74 Room: [facility] OR ROOM 01 Gender: Male Note Status: Finalized Procedure:         Upper GI endoscopy Indications:       Iron deficiency anemia Providers:         Lucilla Lame, MD Referring MD:      Maud Deed. Copland, MD (Referring MD) Medicines:         Propofol per Anesthesia Complications:     No immediate complications. Procedure:         Pre-Anesthesia Assessment:                    - Prior to the procedure, a History and Physical was                     performed, and patient medications and allergies were                     reviewed. The patient's tolerance of previous anesthesia                     was also reviewed. The risks and benefits of the procedure                     and the sedation options and risks were discussed with the                     patient. All questions were answered, and informed consent                     was obtained. Prior Anticoagulants: The patient has taken                     no previous anticoagulant or antiplatelet agents. ASA                     Grade Assessment: II - A patient with mild systemic                     disease. After reviewing the risks and benefits, the                     patient was deemed in satisfactory condition to undergo                     the procedure.                    After obtaining informed consent, the endoscope was passed                     under direct vision. Throughout the procedure, the                     patient's blood pressure, pulse, and oxygen saturations                     were monitored continuously. The Olympus GIF-[id number]                     Endoscope (S#. [id number]) was introduced through the mouth,  and advanced to the second part of duodenum. The upper GI                      endoscopy was accomplished without difficulty. The patient                     tolerated the procedure well. Findings:      The examined esophagus was normal.      Localized moderate inflammation characterized by erosions and erythema       was found in the entire examined stomach. Biopsies were taken with a       cold forceps for histology.      A single 15 mm sessile polyp with no bleeding was found in the second       part of the duodenum. Biopsies were taken with a cold forceps for       histology.      A single small angiodysplastic lesion without bleeding was found in the       second part of the duodenum. Impression:        - Normal esophagus.                    - Gastritis. Biopsied.                    - A single duodenal polyp. Biopsied.                    - A single non-bleeding angiodysplastic lesion in the                     duodenum. Recommendation:    - Await pathology results.                    - If duodenal lesion adenomatous then will need removal. Procedure Code(s): --- Professional ---                    [phone], Esophagogastroduodenoscopy, flexible, transoral;                     with biopsy, single or multiple Diagnosis Code(s): --- Professional ---                    D50.9, Iron deficiency anemia, unspecified                    K29.70, Gastritis, unspecified, without bleeding                    K31.819, Angiodysplasia of stomach and duodenum without                     bleeding                    K31.7, Polyp of stomach and duodenum CPT copyright 2014 American Medical Association. All rights reserved. The codes documented in this report are preliminary and upon coder review may  be revised to meet current compliance requirements. Lucilla Lame, MD 07/23/2015 8:20:51 AM This report has been signed electronically. Number of Addenda: 0 Note Initiated On: 07/23/2015 8:07 AM Total Procedure Duration: 0 hours 3 minutes 37 seconds       [facility]

## 2015-07-23 NOTE — Anesthesia Postprocedure Evaluation (Signed)
  Anesthesia Post-op Note  Patient: Devin Garza  Procedure(s) Performed: Procedure(s) with comments: COLONOSCOPY (N/A) - WITH BIOPSIES---- TRANSVERSE COLON POLYP DESCENDING COLON POLYP  X  4 ESOPHAGOGASTRODUODENOSCOPY (EGD) (N/A) - WITH BIOPSY--- DUODENAL BIOPSY GASTRIC BIOPSY  Anesthesia type:MAC  Patient location: PACU  Post pain: Pain level controlled  Post assessment: Post-op Vital signs reviewed, Patient's Cardiovascular Status Stable, Respiratory Function Stable, Patent Airway and No signs of Nausea or vomiting  Post vital signs: Reviewed and stable  Last Vitals:  Filed Vitals:   07/23/15 0854  BP:   Pulse: 104  Temp:   Resp: 20    Level of consciousness: awake, alert  and patient cooperative  Complications: No apparent anesthesia complications

## 2015-07-23 NOTE — Anesthesia Preprocedure Evaluation (Signed)
Anesthesia Evaluation  Patient identified by MRN, date of birth, ID band Patient awake    Reviewed: Allergy & Precautions, H&P , NPO status , Patient's Chart, lab work & pertinent test results, reviewed documented beta blocker date and time   Airway Mallampati: II  TM Distance: >3 FB Neck ROM: full    Dental no notable dental hx. (+) Upper Dentures, Lower Dentures   Pulmonary Current Smoker,  breath sounds clear to auscultation  Pulmonary exam normal       Cardiovascular Exercise Tolerance: Good hypertension, Rhythm:regular Rate:Normal     Neuro/Psych negative neurological ROS  negative psych ROS   GI/Hepatic Neg liver ROS, GERD-  ,  Endo/Other  negative endocrine ROS  Renal/GU negative Renal ROS  negative genitourinary   Musculoskeletal   Abdominal   Peds  Hematology  (+) anemia ,   Anesthesia Other Findings   Reproductive/Obstetrics negative OB ROS                             Anesthesia Physical Anesthesia Plan  ASA: II  Anesthesia Plan: MAC   Post-op Pain Management:    Induction:   Airway Management Planned:   Additional Equipment:   Intra-op Plan:   Post-operative Plan:   Informed Consent: I have reviewed the patients History and Physical, chart, labs and discussed the procedure including the risks, benefits and alternatives for the proposed anesthesia with the patient or authorized representative who has indicated his/her understanding and acceptance.   Dental Advisory Given  Plan Discussed with: CRNA  Anesthesia Plan Comments:         Anesthesia Quick Evaluation

## 2015-07-23 NOTE — H&P (Signed)
[facility]  [address] [place], [place] [address] Phone: [phone] Fax : [phone]  Primary Care Physician:  Devin Loffler, MD Primary Gastroenterologist:  Dr. Allen Garza  Pre-Procedure History & Physical: HPI:  Devin Garza is a 74 y.o. male is here for an endoscopy and colonoscopy.   Past Medical History  Diagnosis Date  . Hypertension   . HLD (hyperlipidemia)   . Osteoarthritis   . Basal cell carcinoma of lip 2007  . ED (erectile dysfunction)   . Tobacco abuse   . Anemia   . Tubular adenoma of colon 2005  . Wears dentures     full upper and lower    Past Surgical History  Procedure Laterality Date  . Skin cancer excision      LIP  . Ankle arthroscopy w/ open repair    . Colonoscopy  2006    [place]-Tubular adenoma  . Upper gi endoscopy  12/2009    Dr. Ivory Garza gastritisl negative for H. pylori; duodenal biopsies neg for sprue    Prior to Admission medications   Medication Sig Start Date End Date Taking? Authorizing Provider  atorvastatin (LIPITOR) 10 MG tablet Take 1 tablet (10 mg total) by mouth daily. 04/23/15  Yes Devin Copland, MD  buPROPion (WELLBUTRIN XL) 150 MG 24 hr tablet Take 1 tablet (150 mg total) by mouth daily. 04/23/15  Yes Devin Loffler, MD  CINNAMON PO Take by mouth daily.   Yes Historical Provider, MD  esomeprazole (NEXIUM) 40 MG capsule Take 1 capsule (40 mg total) by mouth daily at 12 noon. 05/28/15  Yes Devin Meuse, NP  eszopiclone (LUNESTA) 2 MG TABS tablet Take 1 tablet (2 mg total) by mouth at bedtime as needed for sleep. Take immediately before bedtime 04/23/15  Yes Devin Loffler, MD  Multiple Vitamin (MULTIVITAMIN) tablet Take 1 tablet by mouth daily.   Yes Historical Provider, MD  Na Sulfate-K Sulfate-Mg Sulf (SUPREP BOWEL PREP) SOLN Take 1 kit by mouth as directed. 07/02/15  Yes Devin Lame, MD  valsartan-hydrochlorothiazide (DIOVAN HCT) 320-12.5 MG per tablet Take 1 tablet by mouth daily. 04/23/15  Yes  Devin Loffler, MD  vitamin C (ASCORBIC ACID) 500 MG tablet Take 500 mg by mouth daily.   Yes Historical Provider, MD    Allergies as of 06/04/2015  . (No Known Allergies)    Family History  Problem Relation Age of Onset  . Cancer Mother 80    LUNG  . Cancer Father     THROAT  . Hearing loss Father     HEART ATTACK, CAD  . Cancer Sister     OVARIAN    History   Social History  . Marital Status: Divorced    Spouse Name: N/A  . Number of Children: 2  . Years of Education: N/A   Occupational History  . retired [employer]    Social History Main Topics  . Smoking status: Current Every Day Smoker -- 0.50 packs/day for 40 years    Types: Cigarettes  . Smokeless tobacco: Never Used     Comment: cigars occassionally  . Alcohol Use: 9.6 oz/week    14 Shots of liquor, 2 Standard drinks or equivalent per week     Comment: 2 drinks whiskey daily  . Drug Use: No  . Sexual Activity: Not on file   Other Topics Concern  . Not on file   Social History Narrative   Lives alone    Review of Systems: See HPI, otherwise negative  ROS  Physical Exam: BP 120/94 mmHg  Pulse 95  Temp(Src) 97.9 F (36.6 C)  Resp 17  Ht 5' 8.25" (1.734 m)  Wt 182 lb (82.555 kg)  BMI 27.46 kg/m2  SpO2 96% General:   Alert,  pleasant and cooperative in NAD Head:  Normocephalic and atraumatic. Neck:  Supple; no masses or thyromegaly. Lungs:  Clear throughout to auscultation.    Heart:  Regular rate and rhythm. Abdomen:  Soft, nontender and nondistended. Normal bowel sounds, without guarding, and without rebound.   Neurologic:  Alert and  oriented x4;  grossly normal neurologically.  Impression/Plan: Devin Garza is here for an endoscopy and colonoscopy to be performed for IDA  Risks, benefits, limitations, and alternatives regarding  endoscopy and colonoscopy have been reviewed with the patient.  Questions have been answered.  All parties agreeable.   Devin Bowl, MD  07/23/2015,  8:05 AM

## 2015-07-23 NOTE — Op Note (Signed)
[facility] Gastroenterology Patient Name: Devin Garza Procedure Date: 07/23/2015 8:21 AM MRN: [id number] Account #: [id number] Date of Birth: [birth year] Admit Type: Outpatient Age: 74 Room: [facility] OR ROOM 01 Gender: Male Note Status: Finalized Procedure:         Colonoscopy Indications:       Iron deficiency anemia Providers:         Lucilla Lame, MD Referring MD:      Maud Deed. Copland, MD (Referring MD) Medicines:         Propofol per Anesthesia Complications:     No immediate complications. Procedure:         Pre-Anesthesia Assessment:                    - Prior to the procedure, a History and Physical was                     performed, and patient medications and allergies were                     reviewed. The patient's tolerance of previous anesthesia                     was also reviewed. The risks and benefits of the procedure                     and the sedation options and risks were discussed with the                     patient. All questions were answered, and informed consent                     was obtained. Prior Anticoagulants: The patient has taken                     no previous anticoagulant or antiplatelet agents. ASA                     Grade Assessment: II - A patient with mild systemic                     disease. After reviewing the risks and benefits, the                     patient was deemed in satisfactory condition to undergo                     the procedure.                    After obtaining informed consent, the colonoscope was                     passed under direct vision. Throughout the procedure, the                     patient's blood pressure, pulse, and oxygen saturations                     were monitored continuously. The Olympus CF H180AL                     colonoscope (S#: [id number]) was introduced through the anus  and advanced to the the cecum, identified by appendiceal                     orifice  and ileocecal valve. The colonoscopy was performed                     without difficulty. The patient tolerated the procedure                     well. The quality of the bowel preparation was excellent. Findings:      The perianal and digital rectal examinations were normal.      A 6 mm polyp was found in the transverse colon. The polyp was sessile.       The polyp was removed with a cold snare. Resection and retrieval were       complete.      Three semi-pedunculated polyps were found in the descending colon. The       polyps were 7 to 10 mm in size. These polyps were removed with a hot       snare. Resection and retrieval were complete.      Non-bleeding internal hemorrhoids were found during retroflexion. The       hemorrhoids were Grade I (internal hemorrhoids that do not prolapse). Impression:        - One 6 mm polyp in the transverse colon. Resected and                     retrieved.                    - Three 7 to 10 mm polyps in the descending colon.                     Resected and retrieved.                    - Non-bleeding internal hemorrhoids. Recommendation:    - Await pathology results. Procedure Code(s): --- Professional ---                    [phone], Colonoscopy, flexible; with removal of tumor(s),                     polyp(s), or other lesion(s) by snare technique Diagnosis Code(s): --- Professional ---                    D50.9, Iron deficiency anemia, unspecified                    D12.3, Benign neoplasm of transverse colon                    D12.4, Benign neoplasm of descending colon CPT copyright 2014 American Medical Association. All rights reserved. The codes documented in this report are preliminary and upon coder review may  be revised to meet current compliance requirements. Lucilla Lame, MD 07/23/2015 8:38:42 AM This report has been signed electronically. Number of Addenda: 0 Note Initiated On: 07/23/2015 8:21 AM Scope Withdrawal Time: 0 hours 8 minutes 35  seconds  Total Procedure Duration: 0 hours 12 minutes 59 seconds       [facility]

## 2015-07-23 NOTE — Transfer of Care (Signed)
Immediate Anesthesia Transfer of Care Note  Patient: Devin Garza  Procedure(s) Performed: Procedure(s) with comments: COLONOSCOPY (N/A) - WITH BIOPSIES---- TRANSVERSE COLON POLYP DESCENDING COLON POLYP  X   ESOPHAGOGASTRODUODENOSCOPY (EGD) (N/A) - WITH BIOPSY--- DUODENAL BIOPSY GASTRIC BIOPSY  Patient Location: PACU  Anesthesia Type: MAC  Level of Consciousness: awake, alert  and patient cooperative  Airway and Oxygen Therapy: Patient Spontanous Breathing and Patient connected to supplemental oxygen  Post-op Assessment: Post-op Vital signs reviewed, Patient's Cardiovascular Status Stable, Respiratory Function Stable, Patent Airway and No signs of Nausea or vomiting  Post-op Vital Signs: Reviewed and stable  Complications: No apparent anesthesia complications

## 2015-07-24 ENCOUNTER — Encounter: Payer: Self-pay | Admitting: Gastroenterology

## 2015-07-30 DIAGNOSIS — C44311 Basal cell carcinoma of skin of nose: Secondary | ICD-10-CM | POA: Diagnosis not present

## 2015-07-30 DIAGNOSIS — L570 Actinic keratosis: Secondary | ICD-10-CM | POA: Diagnosis not present

## 2015-07-30 DIAGNOSIS — D485 Neoplasm of uncertain behavior of skin: Secondary | ICD-10-CM | POA: Diagnosis not present

## 2015-07-30 DIAGNOSIS — L578 Other skin changes due to chronic exposure to nonionizing radiation: Secondary | ICD-10-CM | POA: Diagnosis not present

## 2015-07-30 DIAGNOSIS — C44212 Basal cell carcinoma of skin of right ear and external auricular canal: Secondary | ICD-10-CM | POA: Diagnosis not present

## 2015-07-30 DIAGNOSIS — Z85828 Personal history of other malignant neoplasm of skin: Secondary | ICD-10-CM | POA: Diagnosis not present

## 2015-08-01 ENCOUNTER — Telehealth: Payer: Self-pay

## 2015-08-07 ENCOUNTER — Telehealth: Payer: Self-pay | Admitting: Gastroenterology

## 2015-08-07 ENCOUNTER — Telehealth: Payer: Self-pay

## 2015-08-07 NOTE — Telephone Encounter (Signed)
Patient was called multiple times last week without an answering machine or summary answering the phone the patient was then called today to tell him to come in for an office visit to discuss his adenomatous polyp in his duodenum that needs removal at a tertiary care center. The patient was called by me today and was very angry and irate. The patient was verbally abusive and personally abusive towards May. The patient was very mad that he had not gotten his results over the phone. The patient was told that because of him needing to be set up for a resection of this lesion and for sitting down to explain what it and tells an office visit was suggested to him. The patient became irate with the office staff prior to speaking to me earlier today and her stem out and refused to come in and wanted a call by me today. Again the patient was called told his results and that he would need the duodenal lesion removed in that the adenomatous polyps in his colon were already removed. He states he will think about it but does not want to have any further interaction with me. I have stressed the importance of having this removed and the Cancerous potential. He states he understands and will find somebody to remove it.

## 2015-08-07 NOTE — Telephone Encounter (Signed)
-----   Message from Lucilla Lame, MD sent at 07/29/2015  1:23 PM EDT ----- Please have this patient come in to talk about his small bowel pathology and set him up for endoscopic mucosal resection at [facility].

## 2015-08-07 NOTE — Telephone Encounter (Signed)
Called pt again today to schedule follow up with Dr. Allen Norris. While trying to explain to him Dr. Allen Norris wanted a follow up appt, pt got really irrate saying he doesn't understand why it was taking so long to get results. These should have been available 2 days after his procedure. I explained to the patient that it sometimes takes time to receive results. Pt wanted to know why I couldn't give him these over the phone and I explained to him (while he kept interrupting me) that Im not allowed to give results without Dr. Allen Norris telling me the results first. Pt demanded

## 2015-08-07 NOTE — Telephone Encounter (Signed)
Pt left v/m requesting cb from Dr Lorelei Pont; spoke with pt, he has an issue with behavior and attitude of  Dr Rogers Blocker at [facility]; pt had colonoscopy and endoscopy 15 days age and has not received report yet. Pt has left 2 messages for Dr Rogers Blocker to call pt back with no response. Pt wants contact # for appropriate person with  to discuss pts concerns. I advised Rober Minion, office manager and she said pt could call [place] [facility] [employer] [phone]. Pt said he will call [facility]. Pt also wants Dr Lorelei Pont to know that for 9 years pt has been coming to [facility] without complaint and pt thinks Dr Lorelei Pont is wonderful.Pt said he is not a chronic complainer but he does not think he has been treated appropriately by not getting cb from Dr Rogers Blocker. Pt understands that Dr Lorelei Pont is out of office until 08/11/15 but Dr Lorelei Pont could review his phone notes prior to that and pt said a cb is requested. Will cc note to [facility] as well.

## 2015-08-07 NOTE — Telephone Encounter (Signed)
-----   Message from Lucilla Lame, MD sent at 07/29/2015  1:23 PM EDT ----- Please have this patient come in to talk about his small bowel pathology and set him up for endoscopic mucosal resection at [facility].

## 2015-08-08 NOTE — Telephone Encounter (Signed)
i will call him personally when able.

## 2015-08-10 NOTE — Telephone Encounter (Signed)
I spoke with the patient at length, and I reviewed all of his pathology in detail with him.  Colonoscopy and endoscopy pathology.  I did my best to go over them, explaining to the patient that this is certainly not my area of expertise.  Colonoscopy data and adenomatous polyps reviewed with the patient, which I think is accurate.  Also tried to go over his duodenal pathology, and he is going to need follow-up. The patient told me that Dr. Dorothey Baseman office is going to be working on setting him up either at [facility], or [facility].

## 2015-08-12 NOTE — Telephone Encounter (Signed)
LVM for pt to return my call to discuss referral to [facility].

## 2015-08-13 ENCOUNTER — Other Ambulatory Visit: Payer: Self-pay

## 2015-08-13 NOTE — Telephone Encounter (Signed)
Pt returned call regarding referral. Explained to pt that there is not a GI doctor in [place] that can do an Endoscopic mucosal resection on the polyp. Pt has agreed for me to refer him to [facility]. Referral sent to Angie. Pt advised [facility] will call him to schedule appt.

## 2015-09-08 ENCOUNTER — Ambulatory Visit (INDEPENDENT_AMBULATORY_CARE_PROVIDER_SITE_OTHER): Payer: Medicare Other | Admitting: Podiatry

## 2015-09-08 DIAGNOSIS — M79676 Pain in unspecified toe(s): Secondary | ICD-10-CM | POA: Diagnosis not present

## 2015-09-08 DIAGNOSIS — B351 Tinea unguium: Secondary | ICD-10-CM

## 2015-09-08 NOTE — Progress Notes (Signed)
Subjective:     Patient ID: Devin Garza, male   DOB: 1941/01/14, 74 y.o.   MRN: [id number]  HPI patient presents with painful nailbeds 1-5 both feet that he cannot take care of   Review of Systems     Objective:   Physical Exam Neurovascular status unchanged with thick yellow brittle nailbeds 1-5 both feet that are painful    Assessment:     Mycotic infection of nailbeds 1-5 both feet with no iatrogenic bleeding noted    Plan:     Debris painful nailbeds 1-5 both feet with no iatrogenic bleeding noted.  Roselind Messier, [place]

## 2015-09-10 DIAGNOSIS — L578 Other skin changes due to chronic exposure to nonionizing radiation: Secondary | ICD-10-CM | POA: Diagnosis not present

## 2015-09-10 DIAGNOSIS — L820 Inflamed seborrheic keratosis: Secondary | ICD-10-CM | POA: Diagnosis not present

## 2015-09-10 DIAGNOSIS — C44212 Basal cell carcinoma of skin of right ear and external auricular canal: Secondary | ICD-10-CM | POA: Diagnosis not present

## 2015-09-10 DIAGNOSIS — Z85828 Personal history of other malignant neoplasm of skin: Secondary | ICD-10-CM | POA: Diagnosis not present

## 2015-09-24 DIAGNOSIS — Z23 Encounter for immunization: Secondary | ICD-10-CM | POA: Diagnosis not present

## 2015-10-09 DIAGNOSIS — D132 Benign neoplasm of duodenum: Secondary | ICD-10-CM | POA: Diagnosis not present

## 2015-10-09 DIAGNOSIS — K635 Polyp of colon: Secondary | ICD-10-CM | POA: Diagnosis not present

## 2015-10-13 DIAGNOSIS — K31819 Angiodysplasia of stomach and duodenum without bleeding: Secondary | ICD-10-CM | POA: Diagnosis not present

## 2015-10-13 DIAGNOSIS — E785 Hyperlipidemia, unspecified: Secondary | ICD-10-CM | POA: Diagnosis not present

## 2015-10-13 DIAGNOSIS — D132 Benign neoplasm of duodenum: Secondary | ICD-10-CM | POA: Diagnosis not present

## 2015-10-13 DIAGNOSIS — K317 Polyp of stomach and duodenum: Secondary | ICD-10-CM | POA: Diagnosis not present

## 2015-10-13 DIAGNOSIS — I6529 Occlusion and stenosis of unspecified carotid artery: Secondary | ICD-10-CM | POA: Diagnosis not present

## 2015-10-13 DIAGNOSIS — K219 Gastro-esophageal reflux disease without esophagitis: Secondary | ICD-10-CM | POA: Diagnosis not present

## 2015-10-13 DIAGNOSIS — F17200 Nicotine dependence, unspecified, uncomplicated: Secondary | ICD-10-CM | POA: Diagnosis not present

## 2015-10-13 DIAGNOSIS — M1990 Unspecified osteoarthritis, unspecified site: Secondary | ICD-10-CM | POA: Diagnosis not present

## 2015-10-13 DIAGNOSIS — D509 Iron deficiency anemia, unspecified: Secondary | ICD-10-CM | POA: Diagnosis not present

## 2015-10-13 DIAGNOSIS — Z79899 Other long term (current) drug therapy: Secondary | ICD-10-CM | POA: Diagnosis not present

## 2015-10-13 DIAGNOSIS — I10 Essential (primary) hypertension: Secondary | ICD-10-CM | POA: Diagnosis not present

## 2015-12-01 ENCOUNTER — Encounter: Payer: Self-pay | Admitting: Gastroenterology

## 2015-12-08 ENCOUNTER — Ambulatory Visit (INDEPENDENT_AMBULATORY_CARE_PROVIDER_SITE_OTHER): Payer: Medicare Other | Admitting: Podiatry

## 2015-12-08 ENCOUNTER — Ambulatory Visit: Payer: Medicare Other

## 2015-12-08 ENCOUNTER — Encounter: Payer: Self-pay | Admitting: Podiatry

## 2015-12-08 DIAGNOSIS — B351 Tinea unguium: Secondary | ICD-10-CM | POA: Diagnosis not present

## 2015-12-08 DIAGNOSIS — M79676 Pain in unspecified toe(s): Secondary | ICD-10-CM | POA: Diagnosis not present

## 2015-12-08 NOTE — Progress Notes (Signed)
He presents today with chief complaint of painful elongated toenails 1 through 5 bilateral.  Objective: Vital signs are stable alert and oriented 3 pulses are palpable. No open lesions or wounds. Muscle strength is normal. No digital deformities. Toenails are thick yellow dystrophic onychomycotic and painful palpation.  Assessment: Pain in limb secondary to onychomycosis toes 1 through 5 bilateral.  Plan: Debridement of toenails in thickness and length toes 1 through 5 bilateral.  Roselind Messier DPM

## 2016-02-02 DIAGNOSIS — L570 Actinic keratosis: Secondary | ICD-10-CM | POA: Diagnosis not present

## 2016-02-02 DIAGNOSIS — Z1283 Encounter for screening for malignant neoplasm of skin: Secondary | ICD-10-CM | POA: Diagnosis not present

## 2016-02-02 DIAGNOSIS — D229 Melanocytic nevi, unspecified: Secondary | ICD-10-CM | POA: Diagnosis not present

## 2016-02-02 DIAGNOSIS — D1800 Hemangioma unspecified site: Secondary | ICD-10-CM | POA: Diagnosis not present

## 2016-02-02 DIAGNOSIS — L821 Other seborrheic keratosis: Secondary | ICD-10-CM | POA: Diagnosis not present

## 2016-02-02 DIAGNOSIS — L820 Inflamed seborrheic keratosis: Secondary | ICD-10-CM | POA: Diagnosis not present

## 2016-02-02 DIAGNOSIS — D485 Neoplasm of uncertain behavior of skin: Secondary | ICD-10-CM | POA: Diagnosis not present

## 2016-02-02 DIAGNOSIS — L578 Other skin changes due to chronic exposure to nonionizing radiation: Secondary | ICD-10-CM | POA: Diagnosis not present

## 2016-02-02 DIAGNOSIS — Z85828 Personal history of other malignant neoplasm of skin: Secondary | ICD-10-CM | POA: Diagnosis not present

## 2016-02-02 DIAGNOSIS — L812 Freckles: Secondary | ICD-10-CM | POA: Diagnosis not present

## 2016-02-13 ENCOUNTER — Telehealth: Payer: Self-pay

## 2016-02-13 NOTE — Telephone Encounter (Signed)
Received denial for pt's Nexium 40mg  daily. Appeal has been mailed to [employer] per their appeals protocol. This has been sent to [employer] attn: Clinical appeals Dept. [id number], [place] [address].

## 2016-03-03 ENCOUNTER — Telehealth: Payer: Self-pay | Admitting: Cardiovascular Disease

## 2016-03-03 NOTE — Telephone Encounter (Signed)
Lmom to call our office for a carotid u/s (12 mth f/u).

## 2016-03-08 ENCOUNTER — Ambulatory Visit (INDEPENDENT_AMBULATORY_CARE_PROVIDER_SITE_OTHER): Payer: Medicare Other | Admitting: Podiatry

## 2016-03-08 ENCOUNTER — Encounter: Payer: Self-pay | Admitting: Podiatry

## 2016-03-08 DIAGNOSIS — M79676 Pain in unspecified toe(s): Secondary | ICD-10-CM

## 2016-03-08 DIAGNOSIS — B351 Tinea unguium: Secondary | ICD-10-CM

## 2016-03-08 NOTE — Progress Notes (Signed)
He presents today chief complaint of painful elongated toenails.  Objective: Vital signs are stable he is alert and oriented 3 pulses are palpable. His nails are thick yellow dystrophic with mycotic and severely painful.  Assessment: Pain in limb secondary to onychomycosis.  Plan: Debridement of toenails 1 through 5 bilateral service secondary to pain follow up with him in 3 months.

## 2016-03-17 DIAGNOSIS — L570 Actinic keratosis: Secondary | ICD-10-CM | POA: Diagnosis not present

## 2016-03-22 ENCOUNTER — Other Ambulatory Visit: Payer: Self-pay | Admitting: Family Medicine

## 2016-03-22 DIAGNOSIS — I6523 Occlusion and stenosis of bilateral carotid arteries: Secondary | ICD-10-CM

## 2016-04-05 ENCOUNTER — Ambulatory Visit: Payer: Medicare Other

## 2016-04-05 DIAGNOSIS — I6523 Occlusion and stenosis of bilateral carotid arteries: Secondary | ICD-10-CM

## 2016-04-19 ENCOUNTER — Other Ambulatory Visit (INDEPENDENT_AMBULATORY_CARE_PROVIDER_SITE_OTHER): Payer: Medicare Other

## 2016-04-19 DIAGNOSIS — Z79899 Other long term (current) drug therapy: Secondary | ICD-10-CM | POA: Diagnosis not present

## 2016-04-19 DIAGNOSIS — R7303 Prediabetes: Secondary | ICD-10-CM

## 2016-04-19 DIAGNOSIS — Z125 Encounter for screening for malignant neoplasm of prostate: Secondary | ICD-10-CM

## 2016-04-19 DIAGNOSIS — E785 Hyperlipidemia, unspecified: Secondary | ICD-10-CM | POA: Diagnosis not present

## 2016-04-19 LAB — LIPID PANEL
CHOL/HDL RATIO: 2
Cholesterol: 182 mg/dL (ref 0–200)
HDL: 79 mg/dL (ref >39.00)
LDL Cholesterol: 91 mg/dL (ref 0–99)
NONHDL: 103.15
Triglycerides: 61 mg/dL (ref 0.0–149.0)
VLDL: 12.2 mg/dL (ref 0.0–40.0)

## 2016-04-19 LAB — CBC WITH DIFFERENTIAL/PLATELET
Basophils Absolute: 0 10*3/uL (ref 0.0–0.1)
Basophils Relative: 0.7 % (ref 0.0–3.0)
Eosinophils Absolute: 0.3 10*3/uL (ref 0.0–0.7)
Eosinophils Relative: 5.1 % — ABNORMAL HIGH (ref 0.0–5.0)
HCT: 39.5 % (ref 39.0–52.0)
HEMOGLOBIN: 13.3 g/dL (ref 13.0–17.0)
Lymphocytes Relative: 20.8 % (ref 12.0–46.0)
Lymphs Abs: 1.4 10*3/uL (ref 0.7–4.0)
MCHC: 33.7 g/dL (ref 30.0–36.0)
MCV: 91.2 fl (ref 78.0–100.0)
MONOS PCT: 12.1 % — AB (ref 3.0–12.0)
Monocytes Absolute: 0.8 10*3/uL (ref 0.1–1.0)
NEUTROS ABS: 4.1 10*3/uL (ref 1.4–7.7)
NEUTROS PCT: 61.3 % (ref 43.0–77.0)
Platelets: 234 10*3/uL (ref 150.0–400.0)
RBC: 4.33 Mil/uL (ref 4.22–5.81)
RDW: 14.7 % (ref 11.5–15.5)
WBC: 6.7 10*3/uL (ref 4.0–10.5)

## 2016-04-19 LAB — HEPATIC FUNCTION PANEL
ALK PHOS: 66 U/L (ref 39–117)
ALT: 33 U/L (ref 0–53)
AST: 38 U/L — AB (ref 0–37)
Albumin: 4.5 g/dL (ref 3.5–5.2)
Bilirubin, Direct: 0.1 mg/dL (ref 0.0–0.3)
TOTAL PROTEIN: 7.3 g/dL (ref 6.0–8.3)
Total Bilirubin: 0.5 mg/dL (ref 0.2–1.2)

## 2016-04-19 LAB — BASIC METABOLIC PANEL
BUN: 28 mg/dL — ABNORMAL HIGH (ref 6–23)
CO2: 27 meq/L (ref 19–32)
Calcium: 9.9 mg/dL (ref 8.4–10.5)
Chloride: 99 mEq/L (ref 96–112)
Creatinine, Ser: 1.24 mg/dL (ref 0.40–1.50)
GFR: 60.31 mL/min (ref >60.00)
Glucose, Bld: 139 mg/dL — ABNORMAL HIGH (ref 70–99)
POTASSIUM: 4.6 meq/L (ref 3.5–5.1)
SODIUM: 136 meq/L (ref 135–145)

## 2016-04-19 LAB — HEMOGLOBIN A1C: Hgb A1c MFr Bld: 6.2 % (ref 4.6–6.5)

## 2016-04-19 LAB — PSA, MEDICARE: PSA: 1.06 ng/ml (ref 0.10–4.00)

## 2016-04-26 ENCOUNTER — Ambulatory Visit (INDEPENDENT_AMBULATORY_CARE_PROVIDER_SITE_OTHER): Payer: Medicare Other | Admitting: Family Medicine

## 2016-04-26 ENCOUNTER — Encounter: Payer: Self-pay | Admitting: Family Medicine

## 2016-04-26 VITALS — BP 138/76 | HR 110 | Temp 98.8°F | Ht 68.5 in | Wt 186.8 lb

## 2016-04-26 DIAGNOSIS — Z0000 Encounter for general adult medical examination without abnormal findings: Secondary | ICD-10-CM

## 2016-04-26 MED ORDER — VALSARTAN-HYDROCHLOROTHIAZIDE 320-12.5 MG PO TABS: 1.0000 | ORAL_TABLET | Freq: Every day | ORAL | Status: DC

## 2016-04-26 MED ORDER — ESZOPICLONE 2 MG PO TABS: 2.0000 mg | ORAL_TABLET | Freq: Every evening | ORAL | Status: DC | PRN

## 2016-04-26 MED ORDER — BUPROPION HCL ER (XL) 150 MG PO TB24: 150.0000 mg | ORAL_TABLET | Freq: Every day | ORAL | Status: DC

## 2016-04-26 MED ORDER — ATORVASTATIN CALCIUM 10 MG PO TABS: 10.0000 mg | ORAL_TABLET | Freq: Every day | ORAL | Status: DC

## 2016-04-26 NOTE — Progress Notes (Signed)
Pre visit review using our clinic review tool, if applicable. No additional management support is needed unless otherwise documented below in the visit note. 

## 2016-04-26 NOTE — Progress Notes (Signed)
Dr. Frederico Hamman T. Kenzlei Runions, MD, [facility] Medicine [place], [address] Phone: [phone] Fax: [phone]  04/26/2016  Patient: Devin Garza, MRN: [id number], DOB: 09/12/1941, 75 y.o.  Primary Physician:  Owens Loffler, MD   Chief Complaint  Patient presents with  . Annual Exam    Medicare Wellness   Subjective:   FIELD Devin Garza is a 75 y.o. pleasant patient who presents for a medicare wellness examination:  Preventative Health Maintenance Visit:  Health Maintenance Summary Reviewed and updated, unless pt declines services.  Tobacco History Reviewed. Alcohol: No concerns, no excessive use Exercise Habits: Some activity, rec at least 30 mins 5 times a week - playing golf 2 x a week STD concerns: no risk or activity to increase risk Drug Use: None Encouraged self-testicular check  Health Maintenance  Topic Date Due  . TETANUS/TDAP  12/27/2013  . INFLUENZA VACCINE  07/27/2016  . COLONOSCOPY  07/22/2025  . ZOSTAVAX  Completed  . PNA vac Low Risk Adult  Completed    Immunization History  Administered Date(s) Administered  . Influenza Split 09/10/2012, 09/24/2013  . Pneumococcal Conjugate-13 04/23/2015  . Pneumococcal Polysaccharide-23 01/21/2011  . Td 12/28/2003  . Zoster 10/29/2013    Patient Active Problem List   Diagnosis Date Noted  . Anemia, iron deficiency   . Gastritis   . Angiodysplasia of stomach and duodenum without hemorrhage   . Polyp of stomach and duodenum   . Benign neoplasm of transverse colon   . Benign neoplasm of descending colon   . Esophageal reflux 05/28/2015  . Tubular adenoma of colon 05/28/2015  . Prediabetes 04/21/2011  . ALCOHOL ABUSE 01/21/2011  . TRANSAMINASES, SERUM, ELEVATED 01/21/2011  . Anemia 11/12/2009  . CARCINOMA, BASAL CELL 09/03/2008  . CAROTID ARTERY STENOSIS, BILATERAL 08/28/2008  . INSOMNIA UNSPECIFIED 08/28/2008  . Hyperlipidemia 08/27/2008  . HYPERTENSION  08/27/2008   Past Medical History  Diagnosis Date  . Hypertension   . HLD (hyperlipidemia)   . Osteoarthritis   . Basal cell carcinoma of lip 2007  . ED (erectile dysfunction)   . Tobacco abuse   . Anemia   . Tubular adenoma of colon 2005  . Wears dentures     full upper and lower   Past Surgical History  Procedure Laterality Date  . Skin cancer excision      LIP  . Ankle arthroscopy w/ open repair    . Colonoscopy  2006    [place]-Tubular adenoma  . Upper gi endoscopy  12/2009    Dr. Ivory Broad gastritisl negative for H. pylori; duodenal biopsies neg for sprue  . Colonoscopy N/A 07/23/2015    Procedure: COLONOSCOPY;  Surgeon: Lucilla Lame, MD;  Location: [facility];  Service: Gastroenterology;  Laterality: N/A;  WITH BIOPSIES---- TRANSVERSE COLON POLYP DESCENDING COLON POLYP  X  4  . Esophagogastroduodenoscopy N/A 07/23/2015    Procedure: ESOPHAGOGASTRODUODENOSCOPY (EGD);  Surgeon: Lucilla Lame, MD;  Location: [facility];  Service: Gastroenterology;  Laterality: N/A;  WITH BIOPSY--- DUODENAL BIOPSY GASTRIC BIOPSY   Social History   Social History  . Marital Status: Divorced    Spouse Name: N/A  . Number of Children: 2  . Years of Education: N/A   Occupational History  . retired [employer]    Social History Main Topics  . Smoking status: Current Every Day Smoker -- 0.50 packs/day for 40 years    Types: Cigarettes  . Smokeless tobacco: Never Used  Comment: cigars occassionally  . Alcohol Use: 9.6 oz/week    14 Shots of liquor, 2 Standard drinks or equivalent per week     Comment: 2 drinks whiskey daily  . Drug Use: No  . Sexual Activity: Not on file   Other Topics Concern  . Not on file   Social History Narrative   Lives alone   Family History  Problem Relation Age of Onset  . Cancer Mother 83    LUNG  . Cancer Father     THROAT  . Hearing loss Father     HEART ATTACK, CAD  . Cancer Sister     OVARIAN   No Known  Allergies  Medication list has been reviewed and updated.   General: Denies fever, chills, sweats. No significant weight loss. Eyes: Denies blurring,significant itching ENT: Denies earache, sore throat, and hoarseness. Cardiovascular: Denies chest pains, palpitations, dyspnea on exertion Respiratory: Denies cough, dyspnea at rest,wheeezing Breast: no concerns about lumps GI: Denies nausea, vomiting, diarrhea, constipation, change in bowel habits, abdominal pain, melena, hematochezia GU: Denies penile discharge, ED, urinary flow / outflow problems. No STD concerns. Musculoskeletal: Denies back pain, joint pain Derm: Denies rash, itching Neuro: Denies  paresthesias, frequent falls, frequent headaches Psych: Denies depression, anxiety Endocrine: Denies cold intolerance, heat intolerance, polydipsia Heme: Denies enlarged lymph nodes Allergy: No hayfever  Objective:   BP 138/76 mmHg  Pulse 110  Temp(Src) 98.8 F (37.1 C) (Oral)  Ht 5' 8.5" (1.74 m)  Wt 186 lb 12 oz (84.709 kg)  BMI 27.98 kg/m2  The patient completed a fall screen and PHQ-2 and PHQ-9 if necessary, which is documented in the EHR. The CMA/LPN/RN who assisted the patient verbally completed with them and documented results in [place].   Hearing Screening   Method: Audiometry   125Hz  250Hz  500Hz  1000Hz  2000Hz  4000Hz  8000Hz   Right ear:   20 25 20 25    Left ear:   20 20 20  0   Vision Screening Comments: Wears Glasses-Eye Exam Scheduled for End of May  GEN: well developed, well nourished, no acute distress Eyes: conjunctiva and lids normal, PERRLA, EOMI ENT: TM clear, nares clear, oral exam WNL Neck: supple, no lymphadenopathy, no thyromegaly, no JVD Pulm: clear to auscultation and percussion, respiratory effort normal CV: regular rate and rhythm, S1-S2, no murmur, rub or gallop, no bruits, peripheral pulses normal and symmetric, no cyanosis, clubbing, edema or varicosities GI: soft, non-tender; no  hepatosplenomegaly, masses; active bowel sounds all quadrants GU: no hernia, testicular mass, penile discharge Lymph: no cervical, axillary or inguinal adenopathy MSK: gait normal, muscle tone and strength WNL, no joint swelling, effusions, discoloration, crepitus  SKIN: clear, good turgor, color WNL, no rashes, lesions, or ulcerations Neuro: normal mental status, normal strength, sensation, and motion Psych: alert; oriented to person, place and time, normally interactive and not anxious or depressed in appearance.  All labs reviewed with patient.  Lipids:    Component Value Date/Time   CHOL 182 04/19/2016 0825   TRIG 61.0 04/19/2016 0825   HDL 79.00 04/19/2016 0825   LDLDIRECT 132.0 01/18/2011 0914   VLDL 12.2 04/19/2016 0825   CHOLHDL 2 04/19/2016 0825   CBC: CBC Latest Ref Rng 04/19/2016 04/18/2015 11/12/2013  WBC 4.0 - 10.5 K/uL 6.7 5.5 6.4  Hemoglobin 13.0 - 17.0 g/dL 13.3 12.4(L) 12.4(L)  Hematocrit 39.0 - 52.0 % 39.5 36.8(L) 36.4(L)  Platelets 150.0 - 400.0 K/uL 234.0 240.0 903.8    Basic Metabolic Panel:  Component Value Date/Time   NA 136 04/19/2016 0825   K 4.6 04/19/2016 0825   CL 99 04/19/2016 0825   CO2 27 04/19/2016 0825   BUN 28* 04/19/2016 0825   CREATININE 1.24 04/19/2016 0825   GLUCOSE 139* 04/19/2016 0825   CALCIUM 9.9 04/19/2016 0825   Lab Results  Component Value Date   HGBA1C 6.2 04/19/2016    Hepatic Function Latest Ref Rng 04/19/2016 04/18/2015 11/12/2013  Total Protein 6.0 - 8.3 g/dL 7.3 7.2 7.2  Albumin 3.5 - 5.2 g/dL 4.5 4.3 4.2  AST 0 - 37 U/L 38(H) 48(H) 38(H)  ALT 0 - 53 U/L 33 51 35  Alk Phosphatase 39 - 117 U/L 66 92 44  Total Bilirubin 0.2 - 1.2 mg/dL 0.5 0.6 0.6  Bilirubin, Direct 0.0 - 0.3 mg/dL 0.1 - 0.1    Lab Results  Component Value Date   TSH 1.35 01/18/2011   Lab Results  Component Value Date   PSA 1.06 04/19/2016   PSA 1.35 04/18/2015   PSA 1.09 11/12/2013    Assessment and Plan:   Routine general medical  examination at a health care facility  Health Maintenance Exam: The patient's preventative maintenance and recommended screening tests for an annual wellness exam were reviewed in full today. Brought up to date unless services declined.  Counselled on the importance of diet, exercise, and its role in overall health and mortality. The patient's FH and SH was reviewed, including their home life, tobacco status, and drug and alcohol status.  I have personally reviewed the Medicare Annual Wellness questionnaire and have noted 1. The patient's medical and social history 2. Their use of alcohol, tobacco or illicit drugs 3. Their current medications and supplements 4. The patient's functional ability including ADL's, fall risks, home safety risks and hearing or visual             impairment. 5. Diet and physical activities 6. Evidence for depression or mood disorders 7. Reviewed Updated provider list, see scanned forms and [facility] Snapshot.   The patients weight, height, BMI and visual acuity have been recorded in the chart I have made referrals, counseling and provided education to the patient based review of the above and I have provided the pt with a written personalized care plan for preventive services.  I have provided the patient with a copy of your personalized plan for preventive services. Instructed to take the time to review along with their updated medication list.  Doing well.  Follow-up: No Follow-up on file. Or follow-up in 1 year for complete physical examination  New Prescriptions   No medications on file   Modified Medications   Modified Medication Previous Medication   ATORVASTATIN (LIPITOR) 10 MG TABLET atorvastatin (LIPITOR) 10 MG tablet      Take 1 tablet (10 mg total) by mouth daily.    Take 1 tablet (10 mg total) by mouth daily.   BUPROPION (WELLBUTRIN XL) 150 MG 24 HR TABLET buPROPion (WELLBUTRIN XL) 150 MG 24 hr tablet      Take 1 tablet (150 mg total) by mouth  daily.    Take 1 tablet (150 mg total) by mouth daily.   ESZOPICLONE (LUNESTA) 2 MG TABS TABLET eszopiclone (LUNESTA) 2 MG TABS tablet      Take 1 tablet (2 mg total) by mouth at bedtime as needed for sleep. Take immediately before bedtime    Take 1 tablet (2 mg total) by mouth at bedtime as needed for sleep. Take immediately before  bedtime   VALSARTAN-HYDROCHLOROTHIAZIDE (DIOVAN HCT) 320-12.5 MG TABLET valsartan-hydrochlorothiazide (DIOVAN HCT) 320-12.5 MG per tablet      Take 1 tablet by mouth daily.    Take 1 tablet by mouth daily.   No orders of the defined types were placed in this encounter.    Signed,  Maud Deed. Trecia Maring, MD   Patient's Medications  New Prescriptions   No medications on file  Previous Medications   ESOMEPRAZOLE (NEXIUM) 20 MG CAPSULE    Take 20 mg by mouth daily at 12 noon.   MULTIPLE VITAMIN (MULTIVITAMIN) TABLET    Take 1 tablet by mouth daily.   VITAMIN C (ASCORBIC ACID) 500 MG TABLET    Take 500 mg by mouth daily.  Modified Medications   Modified Medication Previous Medication   ATORVASTATIN (LIPITOR) 10 MG TABLET atorvastatin (LIPITOR) 10 MG tablet      Take 1 tablet (10 mg total) by mouth daily.    Take 1 tablet (10 mg total) by mouth daily.   BUPROPION (WELLBUTRIN XL) 150 MG 24 HR TABLET buPROPion (WELLBUTRIN XL) 150 MG 24 hr tablet      Take 1 tablet (150 mg total) by mouth daily.    Take 1 tablet (150 mg total) by mouth daily.   ESZOPICLONE (LUNESTA) 2 MG TABS TABLET eszopiclone (LUNESTA) 2 MG TABS tablet      Take 1 tablet (2 mg total) by mouth at bedtime as needed for sleep. Take immediately before bedtime    Take 1 tablet (2 mg total) by mouth at bedtime as needed for sleep. Take immediately before bedtime   VALSARTAN-HYDROCHLOROTHIAZIDE (DIOVAN HCT) 320-12.5 MG TABLET valsartan-hydrochlorothiazide (DIOVAN HCT) 320-12.5 MG per tablet      Take 1 tablet by mouth daily.    Take 1 tablet by mouth daily.  Discontinued Medications   CINNAMON PO    Take  by mouth daily.   ESOMEPRAZOLE (NEXIUM) 40 MG CAPSULE    Take 1 capsule (40 mg total) by mouth daily at 12 noon.   NA SULFATE-K SULFATE-MG SULF (SUPREP BOWEL PREP) SOLN    Take 1 kit by mouth as directed.

## 2016-05-26 DIAGNOSIS — Z9841 Cataract extraction status, right eye: Secondary | ICD-10-CM | POA: Diagnosis not present

## 2016-05-26 DIAGNOSIS — H5202 Hypermetropia, left eye: Secondary | ICD-10-CM | POA: Diagnosis not present

## 2016-05-26 DIAGNOSIS — Z9842 Cataract extraction status, left eye: Secondary | ICD-10-CM | POA: Diagnosis not present

## 2016-06-14 ENCOUNTER — Encounter: Payer: Self-pay | Admitting: Podiatry

## 2016-06-14 ENCOUNTER — Ambulatory Visit (INDEPENDENT_AMBULATORY_CARE_PROVIDER_SITE_OTHER): Payer: Medicare Other | Admitting: Podiatry

## 2016-06-14 DIAGNOSIS — M79676 Pain in unspecified toe(s): Secondary | ICD-10-CM

## 2016-06-14 DIAGNOSIS — B351 Tinea unguium: Secondary | ICD-10-CM | POA: Diagnosis not present

## 2016-06-14 NOTE — Progress Notes (Signed)
Presents today with chief complaint of painful elongated toenails 1 through 5 bilateral.  Objective: Toenails are thick yellow dystrophic onychomycotic. No open lesions or wounds are noted.  Assessment: Pain in limb secondary to onychomycosis.  Plan: Debridement of toenails 1 through 5 bilateral.

## 2016-08-25 DIAGNOSIS — Z85828 Personal history of other malignant neoplasm of skin: Secondary | ICD-10-CM

## 2016-08-25 DIAGNOSIS — L821 Other seborrheic keratosis: Secondary | ICD-10-CM | POA: Diagnosis not present

## 2016-08-25 DIAGNOSIS — D229 Melanocytic nevi, unspecified: Secondary | ICD-10-CM | POA: Diagnosis not present

## 2016-08-25 DIAGNOSIS — D485 Neoplasm of uncertain behavior of skin: Secondary | ICD-10-CM | POA: Diagnosis not present

## 2016-08-25 DIAGNOSIS — C4492 Squamous cell carcinoma of skin, unspecified: Secondary | ICD-10-CM

## 2016-08-25 DIAGNOSIS — L570 Actinic keratosis: Secondary | ICD-10-CM | POA: Diagnosis not present

## 2016-08-25 DIAGNOSIS — D1800 Hemangioma unspecified site: Secondary | ICD-10-CM | POA: Diagnosis not present

## 2016-08-25 DIAGNOSIS — Z1283 Encounter for screening for malignant neoplasm of skin: Secondary | ICD-10-CM | POA: Diagnosis not present

## 2016-08-25 DIAGNOSIS — C44529 Squamous cell carcinoma of skin of other part of trunk: Secondary | ICD-10-CM | POA: Diagnosis not present

## 2016-08-25 DIAGNOSIS — L812 Freckles: Secondary | ICD-10-CM | POA: Diagnosis not present

## 2016-08-25 DIAGNOSIS — C44212 Basal cell carcinoma of skin of right ear and external auricular canal: Secondary | ICD-10-CM | POA: Diagnosis not present

## 2016-08-25 DIAGNOSIS — L578 Other skin changes due to chronic exposure to nonionizing radiation: Secondary | ICD-10-CM | POA: Diagnosis not present

## 2016-08-25 HISTORY — DX: Personal history of other malignant neoplasm of skin: Z85.828

## 2016-08-25 HISTORY — DX: Squamous cell carcinoma of skin, unspecified: C44.92

## 2016-09-14 ENCOUNTER — Encounter: Payer: Self-pay | Admitting: Podiatry

## 2016-09-15 ENCOUNTER — Ambulatory Visit (INDEPENDENT_AMBULATORY_CARE_PROVIDER_SITE_OTHER): Payer: Medicare Other | Admitting: Podiatry

## 2016-09-15 DIAGNOSIS — B351 Tinea unguium: Secondary | ICD-10-CM

## 2016-09-15 DIAGNOSIS — M79676 Pain in unspecified toe(s): Secondary | ICD-10-CM | POA: Diagnosis not present

## 2016-09-15 NOTE — Progress Notes (Signed)
He presents today with a chief complaint of painful elongated toenails bilateral.  Objective: Pulses remain palpable pain. Toenails are thick yellow dystrophic with mycotic elongated sharply incurvated.  Assessment: Pain in limb secondary to onychomycosis 1 through 5 bilateral.  Plan: Treatment toenails 1 through 5 bilateral. Follow up with him as needed.

## 2016-09-26 DIAGNOSIS — Z23 Encounter for immunization: Secondary | ICD-10-CM | POA: Diagnosis not present

## 2016-09-28 DIAGNOSIS — C44212 Basal cell carcinoma of skin of right ear and external auricular canal: Secondary | ICD-10-CM | POA: Diagnosis not present

## 2016-10-25 ENCOUNTER — Encounter: Payer: Self-pay | Admitting: Family Medicine

## 2016-10-25 ENCOUNTER — Ambulatory Visit (INDEPENDENT_AMBULATORY_CARE_PROVIDER_SITE_OTHER): Payer: Medicare Other | Admitting: Family Medicine

## 2016-10-25 ENCOUNTER — Telehealth: Payer: Self-pay | Admitting: *Deleted

## 2016-10-25 ENCOUNTER — Telehealth: Payer: Self-pay | Admitting: Family Medicine

## 2016-10-25 VITALS — BP 146/74 | HR 128 | Temp 98.5°F | Ht 68.5 in | Wt 192.0 lb

## 2016-10-25 DIAGNOSIS — Z8249 Family history of ischemic heart disease and other diseases of the circulatory system: Secondary | ICD-10-CM | POA: Diagnosis not present

## 2016-10-25 DIAGNOSIS — R0789 Other chest pain: Secondary | ICD-10-CM | POA: Diagnosis not present

## 2016-10-25 DIAGNOSIS — R000 Tachycardia, unspecified: Secondary | ICD-10-CM

## 2016-10-25 DIAGNOSIS — R9431 Abnormal electrocardiogram [ECG] [EKG]: Secondary | ICD-10-CM

## 2016-10-25 DIAGNOSIS — I6523 Occlusion and stenosis of bilateral carotid arteries: Secondary | ICD-10-CM

## 2016-10-25 DIAGNOSIS — Z87891 Personal history of nicotine dependence: Secondary | ICD-10-CM | POA: Diagnosis not present

## 2016-10-25 LAB — CBC WITH DIFFERENTIAL/PLATELET
Basophils Absolute: 59 cells/uL (ref 0–200)
Basophils Relative: 1 %
Eosinophils Absolute: 118 cells/uL (ref 15–500)
Eosinophils Relative: 2 %
HCT: 22.3 % — ABNORMAL LOW (ref 38.5–50.0)
Hemoglobin: 7 g/dL — ABNORMAL LOW (ref 13.2–17.1)
LYMPHS ABS: 944 {cells}/uL (ref 850–3900)
Lymphocytes Relative: 16 %
MCH: 24.3 pg — ABNORMAL LOW (ref 27.0–33.0)
MCHC: 31.4 g/dL — ABNORMAL LOW (ref 32.0–36.0)
MCV: 77.4 fL — ABNORMAL LOW (ref 80.0–100.0)
MPV: 8.8 fL (ref 7.5–12.5)
Monocytes Absolute: 708 cells/uL (ref 200–950)
Monocytes Relative: 12 %
NEUTROS ABS: 4071 {cells}/uL (ref 1500–7800)
Neutrophils Relative %: 69 %
Platelets: 267 10*3/uL (ref 140–400)
RBC: 2.88 MIL/uL — AB (ref 4.20–5.80)
RDW: 18 % — ABNORMAL HIGH (ref 11.0–15.0)
WBC: 5.9 10*3/uL (ref 3.8–10.8)

## 2016-10-25 LAB — HEPATIC FUNCTION PANEL
ALK PHOS: 57 U/L (ref 40–115)
ALT: 21 U/L (ref 9–46)
AST: 29 U/L (ref 10–35)
Albumin: 4.5 g/dL (ref 3.6–5.1)
BILIRUBIN DIRECT: 0.1 mg/dL (ref <=0.2)
BILIRUBIN TOTAL: 0.4 mg/dL (ref 0.2–1.2)
Indirect Bilirubin: 0.3 mg/dL (ref 0.2–1.2)
Total Protein: 7.5 g/dL (ref 6.1–8.1)

## 2016-10-25 LAB — BASIC METABOLIC PANEL
BUN: 30 mg/dL — ABNORMAL HIGH (ref 7–25)
CALCIUM: 9.4 mg/dL (ref 8.6–10.3)
CO2: 20 mmol/L (ref 20–31)
Chloride: 102 mmol/L (ref 98–110)
Creat: 1.37 mg/dL — ABNORMAL HIGH (ref 0.70–1.18)
Glucose, Bld: 164 mg/dL — ABNORMAL HIGH (ref 65–99)
Potassium: 4.6 mmol/L (ref 3.5–5.3)
SODIUM: 135 mmol/L (ref 135–146)

## 2016-10-25 LAB — TROPONIN I: TROPONIN I: 0.02 ng/mL (ref <=0.05)

## 2016-10-25 NOTE — Telephone Encounter (Signed)
Patient Name: Devin Garza  DOB: 1941/06/26    Initial Comment Caller states having shortness of breath, pain around neck   Nurse Assessment  Nurse: Leilani Merl, RN, Heather Date/Time (Eastern Time): 10/25/2016 8:40:21 AM  Confirm and document reason for call. If symptomatic, describe symptoms. You must click the next button to save text entered. ---Caller states he was having shortness of breath, pain around neck on Friday and Saturday, nothing yesterday or today.  Has the patient traveled out of the country within the last 30 days? ---Not Applicable  Does the patient have any new or worsening symptoms? ---Yes  Will a triage be completed? ---Yes  Related visit to physician within the last 2 weeks? ---No  Does the PT have any chronic conditions? (i.e. diabetes, asthma, etc.) ---Yes  List chronic conditions. ---See MR  Is this a behavioral health or substance abuse call? ---No     Guidelines    Guideline Title Affirmed Question Affirmed Notes  Neck Pain or Stiffness [1] MODERATE neck pain (e.g., interferes with normal activities AND [2] present > 3 days    Final Disposition User   See PCP When Office is Open (within 3 days) [place], RN, Water quality scientist    Comments  Appt made for today at 1015 with Dr. Lorelei Pont   Referrals  REFERRED TO PCP OFFICE   Disagree/Comply: Comply

## 2016-10-25 NOTE — Telephone Encounter (Signed)
Pt has appt with Dr Lorelei Pont 10/25/16 at 10:15.

## 2016-10-25 NOTE — Telephone Encounter (Signed)
Received a call from [facility] with a critical lab. HGB is 7.0. I called and relayed result to Dr. Lorelei Pont. He was already aware.

## 2016-10-25 NOTE — Progress Notes (Signed)
Pre visit review using our clinic review tool, if applicable. No additional management support is needed unless otherwise documented below in the visit note. 

## 2016-10-25 NOTE — Progress Notes (Signed)
Dr. Frederico Hamman T. Jeffory Snelgrove, MD, [facility] and Sports Medicine [place], [address] Phone: [phone] Fax: [phone]  10/25/2016  Patient: Devin Garza, MRN: [id number], DOB: 1941/11/14, 75 y.o.  Primary Physician:  Owens Loffler, MD   Chief Complaint  Patient presents with  . Dizziness  . Shortness of Breath  . Neck Pain    radiates down to left shoulder   Subjective:   Devin Garza is a 75 y.o. very pleasant male patient who presents with the following:  The patient started to have chest pain over the weekend, was started on Friday night. He also was having some pain in his neck. This continued off and on through Friday and Saturday, but discontinued by Sunday, and he is asymptomatic today. He did have some neck pain and this radiated down to the left shoulder in addition while he was having chest pain.  Hedid have chest pain and some shortness of breath when he was trying to cure the trash out, and got more winded after minimal exertion compared to what he normally can do. SOB - never has had pain in his neck, pain in the neck on Friday.  Sunday watched football games.   BP running 135/82 at home.   No stresses at home.   Father: 4 with CABG x 3 Used to smoke x 40 pack years HTN Hyperlipidemia Carotid disease  No history of drug abuse.  Past Medical History, Surgical History, Social History, Family History, Problem List, Medications, and Allergies have been reviewed and updated if relevant.  Patient Active Problem List   Diagnosis Date Noted  . Gastritis   . Angiodysplasia of stomach and duodenum without hemorrhage   . Polyp of stomach and duodenum   . Benign neoplasm of transverse colon   . Benign neoplasm of descending colon   . Esophageal reflux 05/28/2015  . Tubular adenoma of colon 05/28/2015  . Prediabetes 04/21/2011  . ALCOHOL ABUSE 01/21/2011  . TRANSAMINASES, SERUM, ELEVATED 01/21/2011  . Anemia  11/12/2009  . CARCINOMA, BASAL CELL 09/03/2008  . CAROTID ARTERY STENOSIS, BILATERAL 08/28/2008  . INSOMNIA UNSPECIFIED 08/28/2008  . Hyperlipidemia 08/27/2008  . HYPERTENSION 08/27/2008    Past Medical History:  Diagnosis Date  . Anemia   . Basal cell carcinoma of lip 2007  . ED (erectile dysfunction)   . HLD (hyperlipidemia)   . Hypertension   . Osteoarthritis   . Tobacco abuse   . Tubular adenoma of colon 2005  . Wears dentures    full upper and lower    Past Surgical History:  Procedure Laterality Date  . ANKLE ARTHROSCOPY W/ OPEN REPAIR    . COLONOSCOPY  2006   [place]-Tubular adenoma  . COLONOSCOPY N/A 07/23/2015   Procedure: COLONOSCOPY;  Surgeon: Lucilla Lame, MD;  Location: [facility];  Service: Gastroenterology;  Laterality: N/A;  WITH BIOPSIES---- TRANSVERSE COLON POLYP DESCENDING COLON POLYP  X  4  . ESOPHAGOGASTRODUODENOSCOPY N/A 07/23/2015   Procedure: ESOPHAGOGASTRODUODENOSCOPY (EGD);  Surgeon: Lucilla Lame, MD;  Location: [facility];  Service: Gastroenterology;  Laterality: N/A;  WITH BIOPSY--- DUODENAL BIOPSY GASTRIC BIOPSY  . SKIN CANCER EXCISION     LIP  . UPPER GI ENDOSCOPY  12/2009   Dr. Ivory Broad gastritisl negative for H. pylori; duodenal biopsies neg for sprue    Social History   Social History  . Marital status: Divorced    Spouse name: N/A  . Number of children: 2  .  Years of education: N/A   Occupational History  . retired [employer] Retired   Social History Main Topics  . Smoking status: Current Every Day Smoker    Packs/day: 0.50    Years: 40.00    Types: Cigarettes  . Smokeless tobacco: Never Used     Comment: cigars occassionally  . Alcohol use 9.6 oz/week    14 Shots of liquor, 2 Standard drinks or equivalent per week     Comment: 2 drinks whiskey daily  . Drug use: No  . Sexual activity: Not on file   Other Topics Concern  . Not on file   Social History Narrative   Lives alone    Family  History  Problem Relation Age of Onset  . Cancer Mother 31    LUNG  . Cancer Father     THROAT  . Hearing loss Father     HEART ATTACK, CAD  . Cancer Sister     OVARIAN    No Known Allergies  Medication list reviewed and updated in full in [place].  GEN: No acute illnesses, no fevers, chills. GI: No n/v/d, eating normally Pulm: No SOB now Interactive and getting along well at home.  Otherwise, ROS is as per the HPI.  Objective:   BP (!) 146/74   Pulse (!) 128   Temp 98.5 F (36.9 C) (Oral)   Ht 5' 8.5" (1.74 m)   Wt 192 lb (87.1 kg)   SpO2 99%   BMI 28.77 kg/m   GEN: WDWN, NAD, Non-toxic, A & O x 3 HEENT: Atraumatic, Normocephalic. Neck supple. No masses, No LAD. Ears and Nose: No external deformity. CV: RRR, No M/G/R. No JVD. No thrill. No extra heart sounds. PULM: CTA B, no wheezes, crackles, rhonchi. No retractions. No resp. distress. No accessory muscle use. EXTR: No c/c/e NEURO Normal gait.  PSYCH: Normally interactive. Conversant. Not depressed or anxious appearing.  Calm demeanor.   Laboratory and Imaging Data:  Assessment and Plan:   Other chest pain - Plan: EKG [id number], Basic metabolic panel, CBC with Differential/Platelet, Hepatic function panel, Troponin I, Ambulatory referral to Cardiology  Nonspecific abnormal electrocardiogram (ECG) (EKG) - Plan: Ambulatory referral to Cardiology  Family history of early CAD  History of prior cigarette smoking  Tachycardia  Tachycardic to 120 in the office. Highly anxious face-to-face and worried. History of heavy alcohol use in other times.  EKG: Normal rhythm, tachycardic. Normal axis, normal R wave progression, nonspecific T-wave changes. No clear ST elevation or depression.  Obtain laboratories stat. If the patient's troponin is elevated, he understands that he will need to go to the hospital for emergent evaluation. He is currently asymptomatic, and had chest pain starting 4 days  ago.  Ideally, we can obtain a cardiology consultation this week to work on outpatient evaluation. I appreciate their help.  Follow-up: No Follow-up on file.  Orders Placed This Encounter  Procedures  . Basic metabolic panel  . CBC with Differential/Platelet  . Hepatic function panel  . Troponin I  . Ambulatory referral to Cardiology  . EKG 12-Lead    Signed,  Kitrina Maurin T. Riggins Cisek, MD   Patient's Medications  New Prescriptions   No medications on file  Previous Medications   ATORVASTATIN (LIPITOR) 10 MG TABLET    Take 1 tablet (10 mg total) by mouth daily.   BUPROPION (WELLBUTRIN XL) 150 MG 24 HR TABLET    Take 1 tablet (150 mg total) by mouth daily.   ESOMEPRAZOLE (  NEXIUM) 20 MG CAPSULE    Take 20 mg by mouth daily at 12 noon.   ESZOPICLONE (LUNESTA) 2 MG TABS TABLET    Take 1 tablet (2 mg total) by mouth at bedtime as needed for sleep. Take immediately before bedtime   MULTIPLE VITAMIN (MULTIVITAMIN) TABLET    Take 1 tablet by mouth daily.   VALSARTAN-HYDROCHLOROTHIAZIDE (DIOVAN HCT) 320-12.5 MG TABLET    Take 1 tablet by mouth daily.   VITAMIN C (ASCORBIC ACID) 500 MG TABLET    Take 500 mg by mouth daily.  Modified Medications   No medications on file  Discontinued Medications   No medications on file

## 2016-10-25 NOTE — Patient Instructions (Signed)

## 2016-10-26 ENCOUNTER — Encounter: Payer: Self-pay | Admitting: Cardiology

## 2016-10-26 ENCOUNTER — Encounter (INDEPENDENT_AMBULATORY_CARE_PROVIDER_SITE_OTHER): Payer: Self-pay

## 2016-10-26 ENCOUNTER — Ambulatory Visit (INDEPENDENT_AMBULATORY_CARE_PROVIDER_SITE_OTHER): Payer: Medicare Other | Admitting: Cardiology

## 2016-10-26 VITALS — BP 140/82 | HR 130 | Ht 69.0 in | Wt 191.0 lb

## 2016-10-26 DIAGNOSIS — R9431 Abnormal electrocardiogram [ECG] [EKG]: Secondary | ICD-10-CM | POA: Diagnosis not present

## 2016-10-26 DIAGNOSIS — R000 Tachycardia, unspecified: Secondary | ICD-10-CM | POA: Diagnosis not present

## 2016-10-26 DIAGNOSIS — I6523 Occlusion and stenosis of bilateral carotid arteries: Secondary | ICD-10-CM

## 2016-10-26 DIAGNOSIS — I10 Essential (primary) hypertension: Secondary | ICD-10-CM | POA: Diagnosis not present

## 2016-10-26 DIAGNOSIS — R0602 Shortness of breath: Secondary | ICD-10-CM | POA: Diagnosis not present

## 2016-10-26 NOTE — Progress Notes (Signed)
Cardiology Office Note   Date:  10/26/2016   ID:  Devin Garza, DOB 1941/05/28, MRN [id number]  Referring Doctor:  Devin Loffler, MD   Cardiologist:   Devin Bushy, MD   Reason for consultation:  Chief Complaint  Patient presents with  . other    New patient. SOB at times.      History of Present Illness: Devin Garza is a 75 y.o. male who presents for Evaluation of shortness of breath, abnormal EKG.  Patient was at PCPs office yesterday for visit. Since Friday last week, he is noticed easy fatigability, shortness of breath, walking to his front yard pushing that has Pedis never had that problem before. He did mention some left shoulder pain, mild, nonradiating, possibly positional in nature.  An EKG was done and showed sinus tachycardia with ST depression. Troponin stat was ordered and was negative. CBC later on revealed significant anemia of 7 and 22.   Patient denies lightheadedness, syncope, palpitations.   ROS:  Please see the history of present illness. Aside from mentioned under HPI, all other systems are reviewed and negative.     Past Medical History:  Diagnosis Date  . Anemia   . Basal cell carcinoma of lip 2007  . ED (erectile dysfunction)   . HLD (hyperlipidemia)   . Hypertension   . Osteoarthritis   . Tobacco abuse   . Tubular adenoma of colon 2005  . Wears dentures    full upper and lower    Past Surgical History:  Procedure Laterality Date  . ANKLE ARTHROSCOPY W/ OPEN REPAIR    . COLONOSCOPY  2006   [place]-Tubular adenoma  . COLONOSCOPY N/A 07/23/2015   Procedure: COLONOSCOPY;  Surgeon: Devin Lame, MD;  Location: [place];  Service: Gastroenterology;  Laterality: N/A;  WITH BIOPSIES---- TRANSVERSE COLON POLYP DESCENDING COLON POLYP  X  4  . ESOPHAGOGASTRODUODENOSCOPY N/A 07/23/2015   Procedure: ESOPHAGOGASTRODUODENOSCOPY (EGD);  Surgeon: Devin Lame, MD;  Location: [place];  Service: Gastroenterology;  Laterality:  N/A;  WITH BIOPSY--- DUODENAL BIOPSY GASTRIC BIOPSY  . SKIN CANCER EXCISION     LIP  . UPPER GI ENDOSCOPY  12/2009   Dr. Ivory Garza gastritisl negative for H. pylori; duodenal biopsies neg for sprue     reports that he has been smoking Cigarettes.  He has a 20.00 pack-year smoking history. He has never used smokeless tobacco. He reports that he drinks about 9.6 oz of alcohol per week . He reports that he does not use drugs.   family history includes Cancer in his father and sister; Cancer (age of onset: 94) in his mother; Hearing loss in his father.   Outpatient Medications Prior to Visit  Medication Sig Dispense Refill  . atorvastatin (LIPITOR) 10 MG tablet Take 1 tablet (10 mg total) by mouth daily. 90 tablet 3  . buPROPion (WELLBUTRIN XL) 150 MG 24 hr tablet Take 1 tablet (150 mg total) by mouth daily. 90 tablet 3  . esomeprazole (NEXIUM) 20 MG capsule Take 20 mg by mouth daily at 12 noon.    . eszopiclone (LUNESTA) 2 MG TABS tablet Take 1 tablet (2 mg total) by mouth at bedtime as needed for sleep. Take immediately before bedtime 30 tablet 4  . Multiple Vitamin (MULTIVITAMIN) tablet Take 1 tablet by mouth daily.    . valsartan-hydrochlorothiazide (DIOVAN HCT) 320-12.5 MG tablet Take 1 tablet by mouth daily. 90 tablet 3  . vitamin C (ASCORBIC ACID) 500 MG tablet Take 500  mg by mouth daily.     No facility-administered medications prior to visit.      Allergies: Review of patient's allergies indicates no known allergies.    PHYSICAL EXAM: VS:  BP 140/82   Pulse (!) 130   Ht 5\' 9"  (1.753 m)   Wt 191 lb (86.6 kg)   BMI 28.21 kg/m  , Body mass index is 28.21 kg/m. Wt Readings from Last 3 Encounters:  10/26/16 191 lb (86.6 kg)  10/25/16 192 lb (87.1 kg)  04/26/16 186 lb 12 oz (84.7 kg)    GENERAL:  well developed, well nourished, not in acute distress HEENT: normocephalic, pink conjunctivae, anicteric sclerae, no xanthelasma, normal dentition, oropharynx clear NECK:  no  neck vein engorgement, JVP normal, no hepatojugular reflux, carotid upstroke brisk and symmetric, no bruit, no thyromegaly, no lymphadenopathy LUNGS:  good respiratory effort, clear to auscultation bilaterally CV:  PMI not displaced, no thrills, no lifts, tachycardic, S1 and S2 within normal limits, no palpable S3 or S4, no murmurs, no rubs, no gallops ABD:  Soft, nontender, nondistended, normoactive bowel sounds, no abdominal aortic bruit, no hepatomegaly, no splenomegaly MS: nontender back, no kyphosis, no scoliosis, no joint deformities EXT:  2+ DP/PT pulses, no edema, no varicosities, no cyanosis, no clubbing SKIN: warm, nondiaphoretic, normal turgor, no ulcers NEUROPSYCH: alert, oriented to person, place, and time, sensory/motor grossly intact, normal mood, appropriate affect  Recent Labs: 10/25/2016: ALT 21; BUN 30; Creat 1.37; Hemoglobin 7.0; Platelets 267; Potassium 4.6; Sodium 135   Lipid Panel    Component Value Date/Time   CHOL 182 04/19/2016 0825   TRIG 61.0 04/19/2016 0825   HDL 79.00 04/19/2016 0825   CHOLHDL 2 04/19/2016 0825   VLDL 12.2 04/19/2016 0825   LDLCALC 91 04/19/2016 0825   LDLDIRECT 132.0 01/18/2011 0914     Other studies Reviewed:  EKG:  The ekg from 10/26/2016 was personally reviewed by me and it revealed sinus tachycardia, 124 BPM, poor R-wave progression and nonspecific ST depression diffusely.  Additional studies/ records that were reviewed personally reviewed by me today include: None available   ASSESSMENT AND PLAN: Evaluation for shortness of breath  Patient reported left shoulder pain but denies chest pain Tachycardia, sinus Nonspecific ST-T wave changes Severe anemia  Explained that most likely the severe anemia is driving the compensatory tachycardia. EKG changes noted as above. Severe anemia is likely the cause of the shortness of breath. Recommend evaluation and management of severe anemia. Recommended urgent evaluation and management of  this, possibly inpatient. This has to be done prior to further cardiac/ischemia evaluation. Spoke with PCPs office, spoke with Dr.Guttierez who was covering for Dr. Lorelei Pont.  Discussed at length recommendations to patient. Patient very hesitant to go to the ER or to get admitted to the hospital today. He was concerned about his car being left out here in the hospital parking area. We went over the possible risks of not getting the anemia addressed in urgent fashion, in light of significant tachycardia. Patient finally decided to go home and come back to the ER in the morning, knowing the possible risks of decompensation.   Current medicines are reviewed at length with the patient today.  The patient does not have concerns regarding medicines.  Labs/ tests ordered today include:  Orders Placed This Encounter  Procedures  . EKG 12-Lead    I had a lengthy and detailed discussion with the patient regarding diagnoses, prognosis, diagnostic options, treatment options , and side effects of medications.  Disposition:   FU with undersigned as soon as severe anemia is addressed  Patient is high risk due to ongoing significant tachycardia, severe anemia. Recommendation for urgent evaluation likely inpatient was made to the patient. Patient declined.  Signed, Devin Bushy, MD  10/26/2016 2:54 PM    [place]  This note was generated in part with voice recognition software and I apologize for any typographical errors that were not detected and corrected.

## 2016-10-26 NOTE — Patient Instructions (Signed)
Follow-Up: Your physician recommends that you schedule a follow-up appointment in: 1 month with Dr. Yvone Neu.   It was a pleasure seeing you today here in the office. Please do not hesitate to give [place] a call back if you have any further questions. [place], BSN

## 2016-10-27 ENCOUNTER — Inpatient Hospital Stay
Admission: EM | Admit: 2016-10-27 | Discharge: 2016-10-29 | DRG: 327 | Disposition: A | Discharge status: Home / Self Care | Payer: Medicare Other | Attending: Internal Medicine | Admitting: Internal Medicine

## 2016-10-27 DIAGNOSIS — F329 Major depressive disorder, single episode, unspecified: Secondary | ICD-10-CM | POA: Diagnosis present

## 2016-10-27 DIAGNOSIS — D649 Anemia, unspecified: Secondary | ICD-10-CM | POA: Diagnosis not present

## 2016-10-27 DIAGNOSIS — Z7982 Long term (current) use of aspirin: Secondary | ICD-10-CM

## 2016-10-27 DIAGNOSIS — N179 Acute kidney failure, unspecified: Secondary | ICD-10-CM | POA: Diagnosis present

## 2016-10-27 DIAGNOSIS — E785 Hyperlipidemia, unspecified: Secondary | ICD-10-CM | POA: Diagnosis present

## 2016-10-27 DIAGNOSIS — K921 Melena: Secondary | ICD-10-CM | POA: Diagnosis not present

## 2016-10-27 DIAGNOSIS — K922 Gastrointestinal hemorrhage, unspecified: Secondary | ICD-10-CM | POA: Diagnosis not present

## 2016-10-27 DIAGNOSIS — Z6828 Body mass index (BMI) 28.0-28.9, adult: Secondary | ICD-10-CM

## 2016-10-27 DIAGNOSIS — R531 Weakness: Secondary | ICD-10-CM | POA: Diagnosis not present

## 2016-10-27 DIAGNOSIS — D62 Acute posthemorrhagic anemia: Secondary | ICD-10-CM | POA: Diagnosis not present

## 2016-10-27 DIAGNOSIS — R0602 Shortness of breath: Secondary | ICD-10-CM | POA: Diagnosis not present

## 2016-10-27 DIAGNOSIS — Z85828 Personal history of other malignant neoplasm of skin: Secondary | ICD-10-CM | POA: Diagnosis not present

## 2016-10-27 DIAGNOSIS — K254 Chronic or unspecified gastric ulcer with hemorrhage: Secondary | ICD-10-CM | POA: Diagnosis not present

## 2016-10-27 DIAGNOSIS — Z801 Family history of malignant neoplasm of trachea, bronchus and lung: Secondary | ICD-10-CM | POA: Diagnosis not present

## 2016-10-27 DIAGNOSIS — K2970 Gastritis, unspecified, without bleeding: Secondary | ICD-10-CM | POA: Diagnosis present

## 2016-10-27 DIAGNOSIS — F17210 Nicotine dependence, cigarettes, uncomplicated: Secondary | ICD-10-CM | POA: Diagnosis present

## 2016-10-27 DIAGNOSIS — Z808 Family history of malignant neoplasm of other organs or systems: Secondary | ICD-10-CM | POA: Diagnosis not present

## 2016-10-27 DIAGNOSIS — Z8249 Family history of ischemic heart disease and other diseases of the circulatory system: Secondary | ICD-10-CM | POA: Diagnosis not present

## 2016-10-27 DIAGNOSIS — K5520 Angiodysplasia of colon without hemorrhage: Secondary | ICD-10-CM | POA: Diagnosis present

## 2016-10-27 DIAGNOSIS — I10 Essential (primary) hypertension: Secondary | ICD-10-CM | POA: Diagnosis present

## 2016-10-27 DIAGNOSIS — K219 Gastro-esophageal reflux disease without esophagitis: Secondary | ICD-10-CM | POA: Diagnosis present

## 2016-10-27 DIAGNOSIS — E669 Obesity, unspecified: Secondary | ICD-10-CM | POA: Diagnosis present

## 2016-10-27 DIAGNOSIS — K259 Gastric ulcer, unspecified as acute or chronic, without hemorrhage or perforation: Secondary | ICD-10-CM | POA: Diagnosis not present

## 2016-10-27 DIAGNOSIS — Z85819 Personal history of malignant neoplasm of unspecified site of lip, oral cavity, and pharynx: Secondary | ICD-10-CM

## 2016-10-27 DIAGNOSIS — R195 Other fecal abnormalities: Secondary | ICD-10-CM | POA: Diagnosis not present

## 2016-10-27 DIAGNOSIS — K31819 Angiodysplasia of stomach and duodenum without bleeding: Secondary | ICD-10-CM | POA: Diagnosis not present

## 2016-10-27 LAB — ABO/RH: ABO/RH(D): A POS

## 2016-10-27 LAB — COMPREHENSIVE METABOLIC PANEL
ALBUMIN: 4.4 g/dL (ref 3.5–5.0)
ALK PHOS: 58 U/L (ref 38–126)
ALT: 27 U/L (ref 17–63)
AST: 40 U/L (ref 15–41)
Anion gap: 12 (ref 5–15)
BILIRUBIN TOTAL: 0.6 mg/dL (ref 0.3–1.2)
BUN: 41 mg/dL — AB (ref 6–20)
CALCIUM: 9.5 mg/dL (ref 8.9–10.3)
CO2: 20 mmol/L — ABNORMAL LOW (ref 22–32)
CREATININE: 1.82 mg/dL — AB (ref 0.61–1.24)
Chloride: 102 mmol/L (ref 101–111)
GFR calc Af Amer: 40 mL/min — ABNORMAL LOW (ref >60)
GFR, EST NON AFRICAN AMERICAN: 34 mL/min — AB (ref >60)
GLUCOSE: 172 mg/dL — AB (ref 65–99)
POTASSIUM: 4.5 mmol/L (ref 3.5–5.1)
Sodium: 134 mmol/L — ABNORMAL LOW (ref 135–145)
TOTAL PROTEIN: 7.8 g/dL (ref 6.5–8.1)

## 2016-10-27 LAB — PREPARE RBC (CROSSMATCH)

## 2016-10-27 LAB — CBC
HEMATOCRIT: 22.1 % — AB (ref 40.0–52.0)
Hemoglobin: 7.1 g/dL — ABNORMAL LOW (ref 13.0–18.0)
MCH: 24.6 pg — ABNORMAL LOW (ref 26.0–34.0)
MCHC: 32 g/dL (ref 32.0–36.0)
MCV: 76.9 fL — ABNORMAL LOW (ref 80.0–100.0)
PLATELETS: 253 10*3/uL (ref 150–440)
RBC: 2.88 MIL/uL — ABNORMAL LOW (ref 4.40–5.90)
RDW: 20.3 % — AB (ref 11.5–14.5)
WBC: 6.3 10*3/uL (ref 3.8–10.6)

## 2016-10-27 LAB — HEMOGLOBIN: Hemoglobin: 7.2 g/dL — ABNORMAL LOW (ref 13.0–18.0)

## 2016-10-27 MED ORDER — ALPRAZOLAM 0.5 MG PO TABS
0.5000 mg | ORAL_TABLET | Freq: Once | ORAL | Status: AC
  Administered 2016-10-27: 0.5 mg via ORAL
  Filled 2016-10-27: qty 1

## 2016-10-27 MED ORDER — SODIUM CHLORIDE 0.9 % IV SOLN
8.0000 mg/h | INTRAVENOUS | Status: DC
  Administered 2016-10-27 – 2016-10-29 (×5): 8 mg/h via INTRAVENOUS
  Filled 2016-10-27 (×4): qty 80

## 2016-10-27 MED ORDER — ATORVASTATIN CALCIUM 10 MG PO TABS
10.0000 mg | ORAL_TABLET | Freq: Every day | ORAL | Status: DC
  Administered 2016-10-27 – 2016-10-28 (×2): 10 mg via ORAL
  Filled 2016-10-27 (×2): qty 1

## 2016-10-27 MED ORDER — ACETAMINOPHEN 325 MG PO TABS: 650.0000 mg | ORAL_TABLET | Freq: Four times a day (QID) | ORAL | Status: DC | PRN

## 2016-10-27 MED ORDER — FERROUS SULFATE 325 (65 FE) MG PO TABS
325.0000 mg | ORAL_TABLET | Freq: Every day | ORAL | Status: DC
  Administered 2016-10-27 – 2016-10-29 (×3): 325 mg via ORAL
  Filled 2016-10-27 (×3): qty 1

## 2016-10-27 MED ORDER — SODIUM CHLORIDE 0.9 % IV BOLUS (SEPSIS)
1000.0000 mL | Freq: Once | INTRAVENOUS | Status: AC
Start: 2016-10-27 — End: 2016-10-27
  Administered 2016-10-27: 1000 mL via INTRAVENOUS

## 2016-10-27 MED ORDER — ONDANSETRON HCL 4 MG PO TABS: 4.0000 mg | ORAL_TABLET | Freq: Four times a day (QID) | ORAL | Status: DC | PRN

## 2016-10-27 MED ORDER — VALSARTAN-HYDROCHLOROTHIAZIDE 320-12.5 MG PO TABS: 1.0000 | ORAL_TABLET | Freq: Every day | ORAL | Status: DC

## 2016-10-27 MED ORDER — HYDROCHLOROTHIAZIDE 12.5 MG PO CAPS
12.5000 mg | ORAL_CAPSULE | Freq: Every day | ORAL | Status: DC
  Administered 2016-10-28 – 2016-10-29 (×2): 12.5 mg via ORAL
  Filled 2016-10-27 (×2): qty 1

## 2016-10-27 MED ORDER — SODIUM CHLORIDE 0.9 % IV SOLN
INTRAVENOUS | Status: DC
Start: 2016-10-27 — End: 2016-10-29
  Administered 2016-10-27: 17:00:00 via INTRAVENOUS
  Administered 2016-10-28: 1000 mL via INTRAVENOUS
  Administered 2016-10-28: 07:00:00 via INTRAVENOUS

## 2016-10-27 MED ORDER — BUPROPION HCL ER (XL) 150 MG PO TB24
150.0000 mg | ORAL_TABLET | Freq: Every day | ORAL | Status: DC
  Administered 2016-10-28 – 2016-10-29 (×2): 150 mg via ORAL
  Filled 2016-10-27 (×2): qty 1

## 2016-10-27 MED ORDER — IRBESARTAN 150 MG PO TABS
300.0000 mg | ORAL_TABLET | Freq: Every day | ORAL | Status: DC
  Administered 2016-10-28 – 2016-10-29 (×2): 300 mg via ORAL
  Filled 2016-10-27 (×2): qty 2

## 2016-10-27 MED ORDER — ONDANSETRON HCL 4 MG/2ML IJ SOLN: 4.0000 mg | Freq: Four times a day (QID) | INTRAMUSCULAR | Status: DC | PRN

## 2016-10-27 MED ORDER — SODIUM CHLORIDE 0.9 % IV SOLN
80.0000 mg | Freq: Once | INTRAVENOUS | Status: AC
  Administered 2016-10-27: 80 mg via INTRAVENOUS
  Filled 2016-10-27: qty 80

## 2016-10-27 MED ORDER — SODIUM CHLORIDE 0.9 % IV SOLN
10.0000 mL/h | Freq: Once | INTRAVENOUS | Status: AC
  Administered 2016-10-28: 15:00:00 via INTRAVENOUS

## 2016-10-27 MED ORDER — ZOLPIDEM TARTRATE 5 MG PO TABS
5.0000 mg | ORAL_TABLET | Freq: Every evening | ORAL | Status: DC | PRN
Start: 2016-10-27 — End: 2016-10-27

## 2016-10-27 MED ORDER — ACETAMINOPHEN 650 MG RE SUPP: 650.0000 mg | Freq: Four times a day (QID) | RECTAL | Status: DC | PRN

## 2016-10-27 MED ORDER — ZOLPIDEM TARTRATE 5 MG PO TABS
5.0000 mg | ORAL_TABLET | Freq: Every evening | ORAL | Status: DC | PRN
  Administered 2016-10-27 – 2016-10-28 (×2): 5 mg via ORAL
  Filled 2016-10-27 (×2): qty 1

## 2016-10-27 MED ORDER — ADULT MULTIVITAMIN W/MINERALS CH
1.0000 | ORAL_TABLET | Freq: Every day | ORAL | Status: DC
  Administered 2016-10-28 – 2016-10-29 (×2): 1 via ORAL
  Filled 2016-10-27 (×2): qty 1

## 2016-10-27 NOTE — ED Provider Notes (Signed)
[facility] Emergency Department Provider Note ____________________________________________   I have reviewed the triage vital signs and the triage nursing note.  HISTORY  Chief Complaint Anemia (sent by PCP)   Historian Patient  HPI Devin Garza is a 75 y.o. male who is here due tofeeling fatigued over the weekend, Saturday, slightly better Sunday, had an appointment with his primary doctor on Monday and cardiologist on Tuesday -- due to and was found to be tachycardic and have a low hemoglobin of 7.  He states he was told after the cardiology Department, return emergency Department for evaluation, and he was not ready to come in because he thought he would be admitted and he didn't want to be admitted. This morning he decided to come because he still feeling fatigued. He is found to have tachycardia.  Denies abdominal pain. Reports history of GI bleeding from the stomach years ago. Reports "negative" endoscope and colonoscopy from sometime around May.  Denies black or bloody stool.    Past Medical History:  Diagnosis Date  . Anemia   . Basal cell carcinoma of lip 2007  . ED (erectile dysfunction)   . HLD (hyperlipidemia)   . Hypertension   . Osteoarthritis   . Tobacco abuse   . Tubular adenoma of colon 2005  . Wears dentures    full upper and lower    Patient Active Problem List   Diagnosis Date Noted  . Gastritis   . Angiodysplasia of stomach and duodenum without hemorrhage   . Polyp of stomach and duodenum   . Benign neoplasm of transverse colon   . Benign neoplasm of descending colon   . Esophageal reflux 05/28/2015  . Tubular adenoma of colon 05/28/2015  . Prediabetes 04/21/2011  . ALCOHOL ABUSE 01/21/2011  . TRANSAMINASES, SERUM, ELEVATED 01/21/2011  . Anemia 11/12/2009  . CARCINOMA, BASAL CELL 09/03/2008  . CAROTID ARTERY STENOSIS, BILATERAL 08/28/2008  . INSOMNIA UNSPECIFIED 08/28/2008  . Hyperlipidemia 08/27/2008  . Essential  hypertension 08/27/2008    Past Surgical History:  Procedure Laterality Date  . ANKLE ARTHROSCOPY W/ OPEN REPAIR    . COLONOSCOPY  2006   [place]-Tubular adenoma  . COLONOSCOPY N/A 07/23/2015   Procedure: COLONOSCOPY;  Surgeon: Lucilla Lame, MD;  Location: [place];  Service: Gastroenterology;  Laterality: N/A;  WITH BIOPSIES---- TRANSVERSE COLON POLYP DESCENDING COLON POLYP  X  4  . ESOPHAGOGASTRODUODENOSCOPY N/A 07/23/2015   Procedure: ESOPHAGOGASTRODUODENOSCOPY (EGD);  Surgeon: Lucilla Lame, MD;  Location: [place];  Service: Gastroenterology;  Laterality: N/A;  WITH BIOPSY--- DUODENAL BIOPSY GASTRIC BIOPSY  . SKIN CANCER EXCISION     LIP  . UPPER GI ENDOSCOPY  12/2009   Dr. Ivory Broad gastritisl negative for H. pylori; duodenal biopsies neg for sprue    Prior to Admission medications   Medication Sig Start Date End Date Taking? Authorizing Provider  atorvastatin (LIPITOR) 10 MG tablet Take 1 tablet (10 mg total) by mouth daily. 04/26/16   Owens Loffler, MD  buPROPion (WELLBUTRIN XL) 150 MG 24 hr tablet Take 1 tablet (150 mg total) by mouth daily. 04/26/16   Owens Loffler, MD  esomeprazole (NEXIUM) 20 MG capsule Take 20 mg by mouth daily at 12 noon.    Historical Provider, MD  eszopiclone (LUNESTA) 2 MG TABS tablet Take 1 tablet (2 mg total) by mouth at bedtime as needed for sleep. Take immediately before bedtime 04/26/16   Owens Loffler, MD  Multiple Vitamin (MULTIVITAMIN) tablet Take 1 tablet by mouth daily.  Historical Provider, MD  valsartan-hydrochlorothiazide (DIOVAN HCT) 320-12.5 MG tablet Take 1 tablet by mouth daily. 04/26/16   Owens Loffler, MD  vitamin C (ASCORBIC ACID) 500 MG tablet Take 500 mg by mouth daily.    Historical Provider, MD    No Known Allergies  Family History  Problem Relation Age of Onset  . Cancer Mother 53    LUNG  . Cancer Father     THROAT  . Hearing loss Father     HEART ATTACK, CAD  . Cancer Sister     OVARIAN     Social History Social History  Substance Use Topics  . Smoking status: Current Every Day Smoker    Packs/day: 0.50    Years: 40.00    Types: Cigarettes  . Smokeless tobacco: Never Used     Comment: cigars occassionally  . Alcohol use 9.6 oz/week    14 Shots of liquor, 2 Standard drinks or equivalent per week     Comment: 2 drinks whiskey daily    Review of Systems  Constitutional: Negative for fever.  Eyes: Negative for visual changes. ENT: Negative for sore throat.   Cardiovascular: Negative for chest pain. Respiratory: Negative for shortness of breath. Gastrointestinal: Negative for abdominal pain, vomiting and diarrhea. Genitourinary: Negative for dysuria. Musculoskeletal: Negative for back pain. Skin: Negative for rash. Neurological: Negative for headache.  10 point Review of Systems otherwise negative ____________________________________________   PHYSICAL EXAM:  VITAL SIGNS: ED Triage Vitals [10/27/16 0930]  Enc Vitals Group     BP (!) 165/93     Pulse Rate (!) 127     Resp 16     Temp 98.6 F (37 C)     Temp Source Oral     SpO2 99 %     Weight 192 lb (87.1 kg)     Height 5\' 9"  (1.753 m)     Head Circumference      Peak Flow      Pain Score 0     Pain Loc      Pain Edu?      Excl. in [place]?      Constitutional: Alert and oriented. Well appearing and in no distress. HEENT   Head: Normocephalic and atraumatic.      Eyes: Conjunctivae are normal. PERRL. Normal extraocular movements.      Ears:         Nose: No congestion/rhinnorhea.   Mouth/Throat: Mucous membranes are moist.   Neck: No stridor. Cardiovascular/Chest: Tachycardic, regular rhythm.  No murmurs, rubs, or gallops. Respiratory: Normal respiratory effort without tachypnea nor retractions. Breath sounds are clear and equal bilaterally. No wheezes/rales/rhonchi. Gastrointestinal: Soft. No distention, no guarding, no rebound. Nontender.  Genitourinary/rectal: Nontender rectal  exam. Brown stool, strongly heme positive Musculoskeletal: Nontender with normal range of motion in all extremities. No joint effusions.  No lower extremity tenderness.  No edema. Neurologic:  Normal speech and language. No gross or focal neurologic deficits are appreciated. Skin:  Skin is warm, dry and intact. No rash noted. Psychiatric: Mood and affect are normal. Speech and behavior are normal. Patient exhibits appropriate insight and judgment.   ____________________________________________  LABS (pertinent positives/negatives)  Labs Reviewed  COMPREHENSIVE METABOLIC PANEL - Abnormal; Notable for the following:       Result Value   Sodium 134 (*)    CO2 20 (*)    Glucose, Bld 172 (*)    BUN 41 (*)    Creatinine, Ser 1.82 (*)    GFR calc  non Af Amer 34 (*)    GFR calc Af Amer 40 (*)    All other components within normal limits  CBC - Abnormal; Notable for the following:    RBC 2.88 (*)    Hemoglobin 7.1 (*)    HCT 22.1 (*)    MCV 76.9 (*)    MCH 24.6 (*)    RDW 20.3 (*)    All other components within normal limits  TYPE AND SCREEN  PREPARE RBC (CROSSMATCH)    ____________________________________________    EKG I, Lisa Roca, MD, the attending physician have personally viewed and interpreted all ECGs.  147 bpm. Sinus tachycardia.  Normal axis. Nonspecific ST and T-wave ____________________________________________  RADIOLOGY All Xrays were viewed by me. Imaging interpreted by Radiologist.  None __________________________________________  PROCEDURES  Procedure(s) performed: None  Critical Care performed: None  ____________________________________________   ED COURSE / ASSESSMENT AND PLAN  Pertinent labs & imaging results that were available during my care of the patient were reviewed by me and considered in my medical decision making (see chart for details).   Mr. Sandberg is here tachycardic with fatigue, and has a new anemia from May where his  hemoglobin was 13, not 7. Consistent from 2 days ago, however given the tachycardia, acute renal failure, and GI bleeding, patient was placed on Protonix bolus and drip and admitted to the hospitalist service for symptomatic anemia, blood transfusion. Patient did consent for blood transfusion.  He does report a history in the past of GI bleeding from the stomach. No reported nausea or vomiting.   CONSULTATIONS:  Hospitalist for admission.  Patient / Family / Caregiver informed of clinical course, medical decision-making process, and agree with plan.    ___________________________________________   FINAL CLINICAL IMPRESSION(S) / ED DIAGNOSES   Final diagnoses:  Anemia, unspecified type  Acute renal failure, unspecified acute renal failure type ([place])  Gastrointestinal hemorrhage, unspecified gastrointestinal hemorrhage type              Note: This dictation was prepared with Dragon dictation. Any transcriptional errors that result from this process are unintentional    Lisa Roca, MD 10/27/16 1133

## 2016-10-27 NOTE — H&P (Signed)
[facility] [place] NAME: Devin Garza    MR#:  [id number]  DATE OF BIRTH:  December 15, 1941  DATE OF ADMISSION:  10/27/2016  PRIMARY CARE PHYSICIAN: Owens Loffler, MD   REQUESTING/REFERRING PHYSICIAN: Dr. Lisa Roca  CHIEF COMPLAINT:   Chief Complaint  Patient presents with  . Anemia    sent by PCP    HISTORY OF PRESENT ILLNESS:  Devin Garza  is a 75 y.o. male with a known history of Hypertension, hyperlipidemia, osteoarthritis, basal cell carcinoma of the lip, chronic anemia, previous history of tubular adenoma, who presents to the hospital due to shortness of breath and weakness. Patient's shortness of breath and weakness has progressively gotten worse over the past 3-4 days. He was seen by his primary care physician and noted to be anemic, and then referred to cardiology for tachycardia. Patient had outpatient blood work and noted to be anemic with hemoglobin down to as low as 7.0. He was then referred to the ER for further evaluation. Patient had a rectal exam done by the ER physician which was heme positive and given his acute anemia hospitalist services were contacted for further treatment and evaluation. Patient denies any chest pains, nausea, vomiting, abdominal pain, fever, chills or any other associated symptoms presently.  PAST MEDICAL HISTORY:   Past Medical History:  Diagnosis Date  . Anemia   . Basal cell carcinoma of lip 2007  . ED (erectile dysfunction)   . HLD (hyperlipidemia)   . Hypertension   . Osteoarthritis   . Tobacco abuse   . Tubular adenoma of colon 2005  . Wears dentures    full upper and lower    PAST SURGICAL HISTORY:   Past Surgical History:  Procedure Laterality Date  . ANKLE ARTHROSCOPY W/ OPEN REPAIR    . COLONOSCOPY  2006   [place]-Tubular adenoma  . COLONOSCOPY N/A 07/23/2015   Procedure: COLONOSCOPY;  Surgeon: Lucilla Lame, MD;  Location: [place];  Service: Gastroenterology;   Laterality: N/A;  WITH BIOPSIES---- TRANSVERSE COLON POLYP DESCENDING COLON POLYP  X  4  . ESOPHAGOGASTRODUODENOSCOPY N/A 07/23/2015   Procedure: ESOPHAGOGASTRODUODENOSCOPY (EGD);  Surgeon: Lucilla Lame, MD;  Location: [place];  Service: Gastroenterology;  Laterality: N/A;  WITH BIOPSY--- DUODENAL BIOPSY GASTRIC BIOPSY  . SKIN CANCER EXCISION     LIP  . UPPER GI ENDOSCOPY  12/2009   Dr. Ivory Broad gastritisl negative for H. pylori; duodenal biopsies neg for sprue    SOCIAL HISTORY:   Social History  Substance Use Topics  . Smoking status: Current Every Day Smoker    Packs/day: 0.50    Years: 40.00    Types: Cigarettes  . Smokeless tobacco: Never Used     Comment: cigars occassionally  . Alcohol use 9.6 oz/week    14 Shots of liquor, 2 Standard drinks or equivalent per week     Comment: 2 drinks whiskey daily    FAMILY HISTORY:   Family History  Problem Relation Age of Onset  . Cancer Mother 72    LUNG  . Cancer Father     THROAT  . Hearing loss Father     HEART ATTACK, CAD  . Cancer Sister     OVARIAN    DRUG ALLERGIES:  No Known Allergies  REVIEW OF SYSTEMS:   Review of Systems  Constitutional: Negative for chills and fever.  HENT: Negative for congestion and tinnitus.   Eyes: Negative for blurred vision and double vision.  Respiratory: Positive for shortness of breath. Negative for cough and wheezing.   Cardiovascular: Negative for chest pain, orthopnea and PND.  Gastrointestinal: Positive for blood in stool and melena. Negative for abdominal pain, diarrhea, nausea and vomiting.  Genitourinary: Negative for dysuria and hematuria.  Neurological: Positive for weakness. Negative for dizziness, sensory change and focal weakness.  All other systems reviewed and are negative.   MEDICATIONS AT HOME:   Prior to Admission medications   Medication Sig Start Date End Date Taking? Authorizing Provider  atorvastatin (LIPITOR) 10 MG tablet Take 1 tablet  (10 mg total) by mouth daily. 04/26/16  Yes Spencer Copland, MD  buPROPion (WELLBUTRIN XL) 150 MG 24 hr tablet Take 1 tablet (150 mg total) by mouth daily. 04/26/16  Yes Spencer Copland, MD  esomeprazole (NEXIUM) 20 MG capsule Take 20 mg by mouth daily at 12 noon.   Yes Historical Provider, MD  eszopiclone (LUNESTA) 2 MG TABS tablet Take 1 tablet (2 mg total) by mouth at bedtime as needed for sleep. Take immediately before bedtime 04/26/16  Yes Spencer Copland, MD  ferrous sulfate 325 (65 FE) MG tablet Take 325 mg by mouth daily with breakfast.   Yes Historical Provider, MD  Multiple Vitamin (MULTIVITAMIN) tablet Take 1 tablet by mouth daily.   Yes Historical Provider, MD  valsartan-hydrochlorothiazide (DIOVAN HCT) 320-12.5 MG tablet Take 1 tablet by mouth daily. 04/26/16  Yes Spencer Copland, MD      VITAL SIGNS:  Blood pressure 138/85, pulse (!) 111, temperature 98.6 F (37 C), temperature source Oral, resp. rate 20, height 5\' 9"  (1.753 m), weight 87.1 kg (192 lb), SpO2 99 %.  PHYSICAL EXAMINATION:  Physical Exam  GENERAL:  75 y.o.-year-old pale appearing patient lying in the bed in no acute distress.  EYES: Pupils equal, round, reactive to light and accommodation. No scleral icterus. Extraocular muscles intact.  HEENT: Head atraumatic, normocephalic. Oropharynx and nasopharynx clear. No oropharyngeal erythema, moist oral mucosa  NECK:  Supple, no jugular venous distention. No thyroid enlargement, no tenderness.  LUNGS: Normal breath sounds bilaterally, no wheezing, rales, rhonchi. No use of accessory muscles of respiration.  CARDIOVASCULAR: S1, S2 RRR. No murmurs, rubs, gallops, clicks.  ABDOMEN: Soft, nontender, nondistended. Bowel sounds present. No organomegaly or mass.  EXTREMITIES: No pedal edema, cyanosis, or clubbing. + 2 pedal & radial pulses b/l.   NEUROLOGIC: Cranial nerves II through XII are intact. No focal Motor or sensory deficits appreciated b/l PSYCHIATRIC: The patient is alert  and oriented x 3. Good affect.  SKIN: No obvious rash, lesion, or ulcer.   LABORATORY PANEL:   CBC  Recent Labs Lab 10/27/16 0933  WBC 6.3  HGB 7.1*  HCT 22.1*  PLT 253   ------------------------------------------------------------------------------------------------------------------  Chemistries   Recent Labs Lab 10/27/16 0933  NA 134*  K 4.5  CL 102  CO2 20*  GLUCOSE 172*  BUN 41*  CREATININE 1.82*  CALCIUM 9.5  AST 40  ALT 27  ALKPHOS 58  BILITOT 0.6   ------------------------------------------------------------------------------------------------------------------  Cardiac Enzymes  Recent Labs Lab 10/25/16 1125  TROPONINI 0.02   ------------------------------------------------------------------------------------------------------------------  RADIOLOGY:  No results found.   IMPRESSION AND PLAN:   75 year old male with past medical history of essential hypertension, osteoarthritis, tobacco abuse, hyperlipidemia, chronic anemia, history of tubular adenoma in the colon, who presents to the hospital due to shortness of breath and weakness and noted to be anemic with heme positive stools.  1. GI bleed-this is an upper GI bleed given the patient's melanotic  stools and anemia. -We'll transfuse the patient 1 unit of packed red blood cells, follow serial hemoglobin. -Place on Protonix IV, get a gastroenterology consult.  2. Acute blood loss anemia-secondary to GI bleed. -Patient will be transfused 1 unit of packed red blood cells. Will follow hemoglobin.  3. Acute kidney injury-secondary to the GI bleed. We'll give the patient IV fluids, blood transfusion. -Follow BUN/creatinine.  4. Essential hypertension-continue valsartan/HCTZ.  5. Depression-continue Wellbutrin.  6. Hyperlipidemia-continue atorvastatin.  All the records are reviewed and case discussed with ED provider. Management plans discussed with the patient, family and they are in  agreement.  CODE STATUS: Full code  TOTAL TIME TAKING CARE OF THIS PATIENT: 45 minutes.    Henreitta Leber M.D on 10/27/2016 at 12:08 PM  Between 7am to 6pm - Pager - [phone]  After 6pm go to [email or web address] - password [email or web address] [facility]  Office  [phone]  CC: Primary care physician; Owens Loffler, MD

## 2016-10-27 NOTE — ED Notes (Signed)
Pt up to restroom. Steady gait with no assist needed. NAD noted at this time.

## 2016-10-27 NOTE — ED Notes (Addendum)
Blood bank called to state pt would transfuse on floor so BB can prepare pt type specific blood . MD Sainani notified

## 2016-10-27 NOTE — ED Triage Notes (Signed)
Pt states he was sent by PCP for blood transfusion, states hbg 7.. Pt is pale in color, skin is warm and dry. Respirations WNL.Marland Kitchen

## 2016-10-27 NOTE — Consult Note (Signed)
Patient with significant blood loss, likely over some time period,  Dark stools recently, SOB and EKG changes seen by cardiology and blood loss was felt to be the problem.  Pt admitted with hgb 7, is getting transfusion now.  Chest clear, heart RRR, abd neg.   See note from PA [facility]  Will do EGD tomorrow afternoon.  He had EGD a year ago showing 70mm duodenal polyps and AVM of small bowel.

## 2016-10-27 NOTE — Consult Note (Signed)
GI Inpatient Consult Note  Reason for Consult: anemia    Attending Requesting Consult:  Dr. Verdell Carmine  History of Present Illness: Devin Garza is a 76 y.o. male seen for evaluation of anemia at the request of   Admitted for anemia - baseline Hgb was 12-13, last 13.3 in 06/2016. Admitted for Hgb of 7.0. Has been followed in past by Dr. Allen Norris and [facility]. Reported fatigue, SOB, decreased activity tolerance and was found to be tachycardic w/ hgb 7 at outpatient cardiology appointment. Seen by cardiology who felt that SOB and EKG changes likely due to severe anemia 2 days ago. Decided to come for admission this morning.   He reports approx 4 days ago developing darker stools (no melena) with some exertional SOB, unable to complete normal exercise pattern. Denies any n/v, epigastric pain, GERD, dysphagia. Eating well. On Nexium 20mg  qd-does have symptomatic GERD if tries to wean off Nexium completely. No obvious rectal bleeding, maroon stools, etc. Usually moving stools daily. No lower abd pain.   NSAID use less than once per week, usually Advil.   Last Colonoscopy: 06/2015- Dr. Allen Norris - 6 mm polyp transverse colon, removed with cold snare, (3) 7-10 mm polyps in descending, nonbleeding internal hemorrhoids.   Last Endoscopy: 09/2015-Dr. Grimm-normal esophagus, 69mm sessile polyp in 2nd portion of duodenum. Polyp removed with saline left via piecemeal, 2 small angiectasia is without bleeding and second portion of duodenum, coagulation for bleeding prevention successful.  Lesions did not ablate with thermal therapy.   Past Medical History:  Past Medical History:  Diagnosis Date  . Anemia   . Basal cell carcinoma of lip 2007  . ED (erectile dysfunction)   . HLD (hyperlipidemia)   . Hypertension   . Osteoarthritis   . Tobacco abuse   . Tubular adenoma of colon 2005  . Wears dentures    full upper and lower    Problem List: Patient Active Problem List   Diagnosis Date Noted  . GI bleed  10/27/2016  . Gastritis   . Angiodysplasia of stomach and duodenum without hemorrhage   . Polyp of stomach and duodenum   . Benign neoplasm of transverse colon   . Benign neoplasm of descending colon   . Esophageal reflux 05/28/2015  . Tubular adenoma of colon 05/28/2015  . Prediabetes 04/21/2011  . ALCOHOL ABUSE 01/21/2011  . TRANSAMINASES, SERUM, ELEVATED 01/21/2011  . Anemia 11/12/2009  . CARCINOMA, BASAL CELL 09/03/2008  . CAROTID ARTERY STENOSIS, BILATERAL 08/28/2008  . INSOMNIA UNSPECIFIED 08/28/2008  . Hyperlipidemia 08/27/2008  . Essential hypertension 08/27/2008    Past Surgical History: Past Surgical History:  Procedure Laterality Date  . ANKLE ARTHROSCOPY W/ OPEN REPAIR    . COLONOSCOPY  2006   [place]-Tubular adenoma  . COLONOSCOPY N/A 07/23/2015   Procedure: COLONOSCOPY;  Surgeon: Lucilla Lame, MD;  Location: [place];  Service: Gastroenterology;  Laterality: N/A;  WITH BIOPSIES---- TRANSVERSE COLON POLYP DESCENDING COLON POLYP  X  4  . ESOPHAGOGASTRODUODENOSCOPY N/A 07/23/2015   Procedure: ESOPHAGOGASTRODUODENOSCOPY (EGD);  Surgeon: Lucilla Lame, MD;  Location: [place];  Service: Gastroenterology;  Laterality: N/A;  WITH BIOPSY--- DUODENAL BIOPSY GASTRIC BIOPSY  . SKIN CANCER EXCISION     LIP  . UPPER GI ENDOSCOPY  12/2009   Dr. Ivory Broad gastritisl negative for H. pylori; duodenal biopsies neg for sprue    Allergies: No Known Allergies  Home Medications: Prescriptions Prior to Admission  Medication Sig Dispense Refill Last Dose  . atorvastatin (LIPITOR) 10 MG  tablet Take 1 tablet (10 mg total) by mouth daily. 90 tablet 3 10/26/2016 at 1500  . buPROPion (WELLBUTRIN XL) 150 MG 24 hr tablet Take 1 tablet (150 mg total) by mouth daily. 90 tablet 3 10/27/2016 at 0730  . esomeprazole (NEXIUM) 20 MG capsule Take 20 mg by mouth daily at 12 noon.   10/27/2016 at 0730  . eszopiclone (LUNESTA) 2 MG TABS tablet Take 1 tablet (2 mg total) by mouth at  bedtime as needed for sleep. Take immediately before bedtime 30 tablet 4 Past Month at Unknown time  . ferrous sulfate 325 (65 FE) MG tablet Take 325 mg by mouth daily with breakfast.   10/26/2016 at 0730  . Multiple Vitamin (MULTIVITAMIN) tablet Take 1 tablet by mouth daily.   10/27/2016 at 0730  . valsartan-hydrochlorothiazide (DIOVAN HCT) 320-12.5 MG tablet Take 1 tablet by mouth daily. 90 tablet 3 10/27/2016 at [place] medication reconciliation was completed with the patient.   Scheduled Inpatient Medications:   . sodium chloride  10 mL/hr Intravenous Once  . atorvastatin  10 mg Oral q1800  . buPROPion  150 mg Oral Daily  . ferrous sulfate  325 mg Oral Q breakfast  . [START ON 10/28/2016] irbesartan  300 mg Oral Daily   And  . [START ON 10/28/2016] hydrochlorothiazide  12.5 mg Oral Daily  . multivitamin with minerals  1 tablet Oral Daily    Continuous Inpatient Infusions:   . sodium chloride    . pantoprozole (PROTONIX) infusion 8 mg/hr (10/27/16 1343)    PRN Inpatient Medications:  acetaminophen **OR** acetaminophen, ondansetron **OR** ondansetron (ZOFRAN) IV, zolpidem  Family History: family history includes Cancer in his father and sister; Cancer (age of onset: 27) in his mother; Hearing loss in his father.     Social History:   reports that he has been smoking Cigarettes.  He has a 20.00 pack-year smoking history. He has never used smokeless tobacco. He reports that he drinks about 9.6 oz of alcohol per week . He reports that he does not use drugs.    *Smokes 8 cig/day. Drinks 2 whiskey drinks nightly.   Review of Systems: Constitutional: Weight is stable.  Eyes: No changes in vision. ENT: No oral lesions, sore throat.  GI: see HPI.  Heme/Lymph: No easy bruising.  CV: No chest pain.  GU: No hematuria.  Integumentary: No rashes.  Neuro: No headaches.  Psych: No depression/anxiety.  Endocrine: No heat/cold intolerance.  Allergic/Immunologic: No urticaria.   Resp: + cough, SOB.  Musculoskeletal: No joint swelling.    Physical Examination: BP 140/67 (BP Location: Right Arm)   Pulse (!) 102   Temp 98.7 F (37.1 C) (Oral)   Resp (!) 22   Ht 5\' 9"  (1.753 m)   Wt 87.1 kg (192 lb)   SpO2 100%   BMI 28.35 kg/m  Gen: NAD, alert and oriented x 4 HEENT: PEERLA, EOMI, Neck: supple, no JVD or thyromegaly Chest: CTA bilaterally, no wheezes, crackles, or other adventitious sounds CV: RRR, no m/g/c/r Abd: soft, NT, ND, +BS in all four quadrants; no HSM, guarding, ridigity, or rebound tenderness Ext: no edema, well perfused with 2+ pulses, Skin: no rash or lesions noted Lymph: no LAD  Data: Lab Results  Component Value Date   WBC 6.3 10/27/2016   HGB 7.1 (L) 10/27/2016   HCT 22.1 (L) 10/27/2016   MCV 76.9 (L) 10/27/2016   PLT 253 10/27/2016    Recent Labs Lab 10/25/16 1124 10/27/16 0933  HGB 7.0* 7.1*   Lab Results  Component Value Date   NA 134 (L) 10/27/2016   K 4.5 10/27/2016   CL 102 10/27/2016   CO2 20 (L) 10/27/2016   BUN 41 (H) 10/27/2016   CREATININE 1.82 (H) 10/27/2016   Lab Results  Component Value Date   ALT 27 10/27/2016   AST 40 10/27/2016   ALKPHOS 58 10/27/2016   BILITOT 0.6 10/27/2016   No results for input(s): APTT, INR, PTT in the last 168 hours. Assessment/Plan: Devin Garza is a 75 y.o. male admitted for weakness, tachycardia, etc.   1. Anemia - microcytic, down about 6 grams from baseline. Hx of duodenal AVMs. Rectal in ED w/ brown stool, positive occult blood. Most likely upper GI bleed from recurrent AVMs. He is on a protonix drip. Awaiting transfusion currently. Recommend EGD tomorrow, if negative for bleeding source would recommend colonoscopy following day. Denies anti-coag use. Takes baby aspirin daily and OTC NSAIDs 1x/week. Recommend alcohol abstinence at d/c.   Recommendations:  1. EGD tomorrow w/ Dr. Vira Agar 2. NPO at MN 3. Continue clear liquids until MN 4. Continue PPI gtt.   *case  discussed w/ Dr.Elliott.   Thank you for the consult. Please call with questions or concerns.  Laurine Blazer, PA-C [place]

## 2016-10-28 ENCOUNTER — Inpatient Hospital Stay: Payer: Medicare Other | Admitting: Certified Registered Nurse Anesthetist

## 2016-10-28 ENCOUNTER — Encounter
Admission: EM | Disposition: A | Discharge status: Home / Self Care | Payer: Self-pay | Source: Home / Self Care | Attending: Internal Medicine

## 2016-10-28 ENCOUNTER — Encounter: Payer: Self-pay | Admitting: *Deleted

## 2016-10-28 HISTORY — PX: ESOPHAGOGASTRODUODENOSCOPY: SHX5428

## 2016-10-28 LAB — BASIC METABOLIC PANEL
Anion gap: 7 (ref 5–15)
BUN: 23 mg/dL — ABNORMAL HIGH (ref 6–20)
CALCIUM: 8.6 mg/dL — AB (ref 8.9–10.3)
CO2: 20 mmol/L — ABNORMAL LOW (ref 22–32)
CREATININE: 1.24 mg/dL (ref 0.61–1.24)
Chloride: 111 mmol/L (ref 101–111)
GFR calc non Af Amer: 55 mL/min — ABNORMAL LOW (ref >60)
Glucose, Bld: 153 mg/dL — ABNORMAL HIGH (ref 65–99)
Potassium: 3.9 mmol/L (ref 3.5–5.1)
SODIUM: 138 mmol/L (ref 135–145)

## 2016-10-28 LAB — CBC
HCT: 21.4 % — ABNORMAL LOW (ref 40.0–52.0)
Hemoglobin: 7 g/dL — ABNORMAL LOW (ref 13.0–18.0)
MCH: 25 pg — ABNORMAL LOW (ref 26.0–34.0)
MCHC: 32.5 g/dL (ref 32.0–36.0)
MCV: 76.7 fL — ABNORMAL LOW (ref 80.0–100.0)
Platelets: 202 10*3/uL (ref 150–440)
RBC: 2.79 MIL/uL — ABNORMAL LOW (ref 4.40–5.90)
RDW: 19.4 % — ABNORMAL HIGH (ref 11.5–14.5)
WBC: 4.8 10*3/uL (ref 3.8–10.6)

## 2016-10-28 LAB — PREPARE RBC (CROSSMATCH)

## 2016-10-28 LAB — HEMOGLOBIN AND HEMATOCRIT, BLOOD
HEMATOCRIT: 26.4 % — AB (ref 40.0–52.0)
Hemoglobin: 8.7 g/dL — ABNORMAL LOW (ref 13.0–18.0)

## 2016-10-28 SURGERY — EGD (ESOPHAGOGASTRODUODENOSCOPY)
Anesthesia: General

## 2016-10-28 MED ORDER — PROPOFOL 10 MG/ML IV BOLUS
INTRAVENOUS | Status: DC | PRN
  Administered 2016-10-28 (×2): 40 mg via INTRAVENOUS
  Administered 2016-10-28: 20 mg via INTRAVENOUS

## 2016-10-28 MED ORDER — FENTANYL CITRATE (PF) 100 MCG/2ML IJ SOLN
INTRAMUSCULAR | Status: DC | PRN
  Administered 2016-10-28: 50 ug via INTRAVENOUS

## 2016-10-28 MED ORDER — MIDAZOLAM HCL 2 MG/2ML IJ SOLN
INTRAMUSCULAR | Status: DC | PRN
  Administered 2016-10-28: .75 mg via INTRAVENOUS

## 2016-10-28 MED ORDER — SODIUM CHLORIDE 0.9 % IV SOLN: Freq: Once | INTRAVENOUS | Status: DC

## 2016-10-28 MED ORDER — LIDOCAINE HCL (CARDIAC) 20 MG/ML IV SOLN
INTRAVENOUS | Status: DC | PRN
  Administered 2016-10-28: 100 mg via INTRAVENOUS

## 2016-10-28 MED ORDER — PHENYLEPHRINE HCL 10 MG/ML IJ SOLN
INTRAMUSCULAR | Status: DC | PRN
  Administered 2016-10-28 (×2): 100 ug via INTRAVENOUS

## 2016-10-28 MED ORDER — PROPOFOL 500 MG/50ML IV EMUL
INTRAVENOUS | Status: DC | PRN
  Administered 2016-10-28: 150 ug/kg/min via INTRAVENOUS

## 2016-10-28 MED ORDER — PANTOPRAZOLE SODIUM 40 MG PO TBEC: 40.0000 mg | DELAYED_RELEASE_TABLET | Freq: Two times a day (BID) | ORAL | 0 refills | Status: DC

## 2016-10-28 NOTE — Progress Notes (Signed)
Called Dr. Marcille Blanco regarding patient's hemoglobin level- 7.0  Doctor made aware.  Devin Garza  10/28/2016  6:56 AM

## 2016-10-28 NOTE — Transfer of Care (Signed)
Immediate Anesthesia Transfer of Care Note  Patient: Devin Garza  Procedure(s) Performed: Procedure(s): ESOPHAGOGASTRODUODENOSCOPY (EGD) (N/A)  Patient Location: PACU  Anesthesia Type:General  Level of Consciousness: awake  Airway & Oxygen Therapy: Patient Spontanous Breathing and Patient connected to nasal cannula oxygen  Post-op Assessment: Report given to RN and Post -op Vital signs reviewed and stable  Post vital signs: Reviewed and stable  Last Vitals:  Vitals:   10/28/16 1405 10/28/16 1533  BP: 129/61 112/62  Pulse: 82 83  Resp: 18 18  Temp: 37 C 36.6 C    Last Pain:  Vitals:   10/28/16 1533  TempSrc: Tympanic  PainSc:          Complications: No apparent anesthesia complications

## 2016-10-28 NOTE — Anesthesia Procedure Notes (Signed)
Date/Time: 10/28/2016 3:00 PM Performed by: Allean Found Pre-anesthesia Checklist: Patient identified, Emergency Drugs available, Suction available, Patient being monitored and Timeout performed Patient Re-evaluated:Patient Re-evaluated prior to inductionOxygen Delivery Method: Nasal cannula Intubation Type: IV induction

## 2016-10-28 NOTE — Progress Notes (Addendum)
[facility] [place] NAME: Devin Garza    MR#:  [id number]  DATE OF BIRTH:  05/23/1941  SUBJECTIVE:   Wants to go home later today  Still with melena No abdominal pain withl  REVIEW OF SYSTEMS:    Review of Systems  Constitutional: Negative.  Negative for chills, fever and malaise/fatigue.  HENT: Negative.  Negative for ear discharge, ear pain, hearing loss, nosebleeds and sore throat.   Eyes: Negative.  Negative for blurred vision and pain.  Respiratory: Negative.  Negative for cough, hemoptysis, shortness of breath and wheezing.   Cardiovascular: Negative.  Negative for chest pain, palpitations and leg swelling.  Gastrointestinal: Positive for melena. Negative for abdominal pain, blood in stool, diarrhea, nausea and vomiting.  Genitourinary: Negative.  Negative for dysuria.  Musculoskeletal: Negative.  Negative for back pain.  Skin: Negative.   Neurological: Negative for dizziness, tremors, speech change, focal weakness, seizures and headaches.  Endo/Heme/Allergies: Negative.  Does not bruise/bleed easily.  Psychiatric/Behavioral: Negative.  Negative for depression, hallucinations and suicidal ideas.    Tolerating Diet: npo      DRUG ALLERGIES:  No Known Allergies  VITALS:  Blood pressure 126/62, pulse 91, temperature 98.1 F (36.7 C), temperature source Oral, resp. rate 18, height 5\' 9"  (1.753 m), weight 87.1 kg (192 lb), SpO2 99 %.  PHYSICAL EXAMINATION:   Physical Exam  Constitutional: He is oriented to person, place, and time and well-developed, well-nourished, and in no distress. No distress.  HENT:  Head: Normocephalic.  Eyes: No scleral icterus.  Neck: Normal range of motion. Neck supple. No JVD present. No tracheal deviation present.  Cardiovascular: Normal rate, regular rhythm and normal heart sounds.  Exam reveals no gallop and no friction rub.   No murmur heard. Pulmonary/Chest: Effort normal and breath sounds  normal. No respiratory distress. He has no wheezes. He has no rales. He exhibits no tenderness.  Abdominal: Soft. Bowel sounds are normal. He exhibits no distension and no mass. There is no tenderness. There is no rebound and no guarding.  Musculoskeletal: Normal range of motion. He exhibits no edema.  Neurological: He is alert and oriented to person, place, and time.  Skin: Skin is warm. No rash noted. No erythema.  Psychiatric: Affect and judgment normal.      LABORATORY PANEL:   CBC  Recent Labs Lab 10/28/16 0452  WBC 4.8  HGB 7.0*  HCT 21.4*  PLT 202   ------------------------------------------------------------------------------------------------------------------  Chemistries   Recent Labs Lab 10/27/16 0933 10/28/16 0452  NA 134* 138  K 4.5 3.9  CL 102 111  CO2 20* 20*  GLUCOSE 172* 153*  BUN 41* 23*  CREATININE 1.82* 1.24  CALCIUM 9.5 8.6*  AST 40  --   ALT 27  --   ALKPHOS 58  --   BILITOT 0.6  --    ------------------------------------------------------------------------------------------------------------------  Cardiac Enzymes  Recent Labs Lab 10/25/16 1125  TROPONINI 0.02   ------------------------------------------------------------------------------------------------------------------  RADIOLOGY:  No results found.   ASSESSMENT AND PLAN:    75 year male with a history of hyperlipidemia, essential hypertension and tubular adenoma who presents with shortness of breath and found to have anemia.  1. Upper GI bleed with melanotic stools and acute blood loss anemia Patient will acquire another unit of blood making it 2 units. Plan for EGD today Continue PPI  2. Acute blood loss anemia: Patient is now status post 2 units. He sees  3. Acute kidney injury due to  problem #1 Creatinine has much improved with IV fluids and blood transfusion  4. Essential hypertension-continue valsartan/HCTZ.  5. Depression-continue Wellbutrin.  6.  Hyperlipidemia-continue atorvastatin  Management plans discussed with the patient and he is in agreement.  CODE STATUS: full  TOTAL TIME TAKING CARE OF THIS PATIENT: 30 minutes.     POSSIBLE D/C 1-2 days, DEPENDING ON CLINICAL CONDITION.   Emoree Sasaki M.D on 10/28/2016 at 10:26 AM  Between 7am to 6pm - Pager - [phone] After 6pm go to [email or web address] - password [email or web address] [facility]  Office  [phone]  CC: Primary care physician; Owens Loffler, MD  Note: This dictation was prepared with Dragon dictation along with smaller phrase technology. Any transcriptional errors that result from this process are unintentional.

## 2016-10-28 NOTE — Op Note (Signed)
[facility] Patient Name: Devin Garza Procedure Date: 10/28/2016 2:44 PM MRN: [id number] Account #: [id number] Date of Birth: 15-Sep-1941 Admit Type: Inpatient Age: 75 Room: [facility] ENDO ROOM 1 Gender: Male Note Status: Finalized Procedure:            Upper GI endoscopy Indications:          Melena Providers:            Manya Silvas, MD Referring MD:         Maud Deed. Copland MD, MD (Referring MD) Medicines:            Propofol per Anesthesia Complications:        No immediate complications. Procedure:            Pre-Anesthesia Assessment:                       - After reviewing the risks and benefits, the patient                        was deemed in satisfactory condition to undergo the                        procedure.                       After obtaining informed consent, the endoscope was                        passed under direct vision. Throughout the procedure,                        the patient's blood pressure, pulse, and oxygen                        saturations were monitored continuously. The Endoscope                        was introduced through the mouth, and advanced to the                        second part of duodenum. The patient tolerated the                        procedure well. Findings:      The examined esophagus was normal.      Diffuse mild inflammation characterized by congestion (edema), erythema       and granularity was found in the gastric body.      Two small no bleeding angioectasias were found in the gastric body and       on the posterior wall of the stomach. Coagulation for tissue destruction       using argon plasma at 0.4 liters/minute and 20 watts was successful. For       hemostasis, two hemostatic clips were successfully placed. There was no       bleeding at the end of the procedure.      A single small- medium angioectasia without bleeding was found in the       duodenal bulb. Coagulation for  tissue destruction using argon plasma at       0.4 liters/minute and 20 watts was successful. For hemostasis, two  hemostatic clips were successfully placed. There was no bleeding at the       end of the procedure.      A small superficial scar seen in second portion of duodenum.      Seven non-bleeding cratered, linear and superficial small gastric ulcers       with no stigmata of bleeding were found in the gastric antrum. The       largest lesion was 3 mm in largest dimension. No blood and no visible       vessel. Impression:           - Normal esophagus.                       - Gastritis.                       - Two non-bleeding angioectasias in the stomach.                        Treated with argon plasma coagulation (APC). Clips were                        placed.                       - A single non-bleeding angioectasia in the duodenum.                        Treated with argon plasma coagulation (APC). Clips were                        placed.                       - Non-bleeding gastric ulcers with no stigmata of                        bleeding.                       - No specimens collected. Recommendation:       Clear liquid and advance to full liquid tomorrow. PPI                        bid.                       - The findings and recommendations were discussed with                        the patient. Manya Silvas, MD 10/28/2016 3:31:23 PM This report has been signed electronically. Number of Addenda: 0 Note Initiated On: 10/28/2016 2:44 PM      [facility]

## 2016-10-28 NOTE — Anesthesia Preprocedure Evaluation (Signed)
Anesthesia Evaluation  Patient identified by MRN, date of birth, ID band Patient awake    Reviewed: Allergy & Precautions, NPO status , Patient's Chart, lab work & pertinent test results  History of Anesthesia Complications Negative for: history of anesthetic complications  Airway Mallampati: II  TM Distance: >3 FB Neck ROM: Full    Dental  (+) Lower Dentures, Upper Dentures   Pulmonary neg sleep apnea, neg COPD, Current Smoker,    breath sounds clear to auscultation- rhonchi (-) wheezing      Cardiovascular Exercise Tolerance: Good hypertension, Pt. on medications (-) CAD and (-) Past MI  Rhythm:Regular Rate:Normal - Systolic murmurs and - Diastolic murmurs    Neuro/Psych negative neurological ROS  negative psych ROS   GI/Hepatic Neg liver ROS, GERD  ,Acute GIB   Endo/Other  negative endocrine ROSneg diabetes  Renal/GU negative Renal ROS     Musculoskeletal  (+) Arthritis ,   Abdominal (+) - obese,   Peds  Hematology  (+) anemia ,   Anesthesia Other Findings Past Medical History: No date: Anemia 2007: Basal cell carcinoma of lip No date: ED (erectile dysfunction) No date: HLD (hyperlipidemia) No date: Hypertension No date: Osteoarthritis No date: Tobacco abuse 2005: Tubular adenoma of colon No date: Wears dentures     Comment: full upper and lower   Reproductive/Obstetrics                            Anesthesia Physical Anesthesia Plan  ASA: II  Anesthesia Plan: General   Post-op Pain Management:    Induction: Intravenous  Airway Management Planned: Natural Airway  Additional Equipment:   Intra-op Plan:   Post-operative Plan:   Informed Consent: I have reviewed the patients History and Physical, chart, labs and discussed the procedure including the risks, benefits and alternatives for the proposed anesthesia with the patient or authorized representative who has  indicated his/her understanding and acceptance.   Dental advisory given  Plan Discussed with: CRNA and Anesthesiologist  Anesthesia Plan Comments:         Anesthesia Quick Evaluation

## 2016-10-28 NOTE — Consult Note (Signed)
Hgb down to 7 after transfusion.  Recommend transfuse again this morning for EGD later on this afternoon.

## 2016-10-28 NOTE — Anesthesia Postprocedure Evaluation (Signed)
Anesthesia Post Note  Patient: Devin Garza  Procedure(s) Performed: Procedure(s) (LRB): ESOPHAGOGASTRODUODENOSCOPY (EGD) (N/A)  Patient location during evaluation: Endoscopy Anesthesia Type: General Level of consciousness: awake and alert Pain management: pain level controlled Vital Signs Assessment: post-procedure vital signs reviewed and stable Respiratory status: spontaneous breathing, nonlabored ventilation and respiratory function stable Cardiovascular status: blood pressure returned to baseline and stable Postop Assessment: no signs of nausea or vomiting Anesthetic complications: no    Last Vitals:  Vitals:   10/28/16 1533 10/28/16 1543  BP: 112/62 114/74  Pulse: 83 65  Resp: 18 19  Temp: 36.6 C     Last Pain:  Vitals:   10/28/16 1533  TempSrc: Tympanic  PainSc:                  Amory Simonetti

## 2016-10-28 NOTE — Consult Note (Signed)
Patient had EGD with 7 small erosions in antrum, 2 small AVM in stomach I destroyed and put two clips on and one in duodenum I treated with argon and clipped twice.  I think he can have full liquids tonight and when blood count acceptable to Hospitalist he could go home tomorrow or next day.  Should go home on PPI twice a day and follow up in 2-3 weeks if possible and get CBC done.

## 2016-10-29 ENCOUNTER — Encounter: Payer: Self-pay | Admitting: Unknown Physician Specialty

## 2016-10-29 LAB — TYPE AND SCREEN
ABO/RH(D): A POS
ANTIBODY SCREEN: NEGATIVE
UNIT DIVISION: 0
Unit division: 0

## 2016-10-29 LAB — CBC
HEMATOCRIT: 25.5 % — AB (ref 40.0–52.0)
HEMOGLOBIN: 8.4 g/dL — AB (ref 13.0–18.0)
MCH: 25.9 pg — AB (ref 26.0–34.0)
MCHC: 33 g/dL (ref 32.0–36.0)
MCV: 78.6 fL — AB (ref 80.0–100.0)
Platelets: 192 10*3/uL (ref 150–440)
RBC: 3.24 MIL/uL — ABNORMAL LOW (ref 4.40–5.90)
RDW: 19.1 % — ABNORMAL HIGH (ref 11.5–14.5)
WBC: 4.6 10*3/uL (ref 3.8–10.6)

## 2016-10-29 NOTE — Progress Notes (Signed)
Patient discharged to home as ordered. Discharge instructions and follow up appointments  given as ordered Patient is alert and oriented, ambulates without difficulty, no acute distress noted, IV discontinued, IV site clean and dry. No complaints of pain voiced.

## 2016-10-29 NOTE — Discharge Summary (Signed)
[facility] [place] NAME: Devin Garza    MR#:  [id number]  DATE OF BIRTH:  [birth year]  DATE OF ADMISSION:  10/27/2016 ADMITTING PHYSICIAN: Henreitta Leber, MD  DATE OF DISCHARGE: 10/29/2016  PRIMARY CARE PHYSICIAN: Owens Loffler, MD    ADMISSION DIAGNOSIS:  Acute renal failure, unspecified acute renal failure type ([place]) [N17.9] Gastrointestinal hemorrhage, unspecified gastrointestinal hemorrhage type [K92.2] Anemia, unspecified type [D64.9]  DISCHARGE DIAGNOSIS:  Active Problems:   GI bleed   SECONDARY DIAGNOSIS:   Past Medical History:  Diagnosis Date  . Anemia   . Basal cell carcinoma of lip 2007  . ED (erectile dysfunction)   . HLD (hyperlipidemia)   . Hypertension   . Osteoarthritis   . Tobacco abuse   . Tubular adenoma of colon 2005  . Wears dentures    full upper and lower    HOSPITAL COURSE:    75 year male with a history of hyperlipidemia, essential hypertension and tubular adenoma who presents with shortness of breath and found to have anemia.  1. Upper GI bleed with melanotic stools and acute blood loss anemia EGD with 7 small erosions in antrum, 2 small AVM in stomach. Dr Tiffany Kocher put two clips on one and one in duodenum  treated with argon and clipped twice.  He will be discharged with protonix 40 by mouth twice a day for 30 days and then will likely need just a daily dose of Protonix. He will need to be on a soft diet for at least another week. He will follow-up with GI in 3 weeks. He will need a CBC in one week.  2. Acute blood loss anemia: Patient received  2 units of packed red blood cells. His hemoglobin is at a point for discharge.  3. Acute kidney injury due to problem #1 Creatinine improved with IV fluids and blood transfusions.  4. Essential hypertension-continue valsartan/HCTZ.  5. Depression-continue Wellbutrin.  6. Hyperlipidemia-continue atorvastatin  DISCHARGE CONDITIONS AND DIET:    Stable for discharge on heart healthy diet  CONSULTS OBTAINED:  Treatment Team:  Manya Silvas, MD  DRUG ALLERGIES:  No Known Allergies  DISCHARGE MEDICATIONS:   Current Discharge Medication List    START taking these medications   Details  pantoprazole (PROTONIX) 40 MG tablet Take 1 tablet (40 mg total) by mouth 2 (two) times daily. Qty: 30 tablet, Refills: 0      CONTINUE these medications which have NOT CHANGED   Details  atorvastatin (LIPITOR) 10 MG tablet Take 1 tablet (10 mg total) by mouth daily. Qty: 90 tablet, Refills: 3    buPROPion (WELLBUTRIN XL) 150 MG 24 hr tablet Take 1 tablet (150 mg total) by mouth daily. Qty: 90 tablet, Refills: 3    eszopiclone (LUNESTA) 2 MG TABS tablet Take 1 tablet (2 mg total) by mouth at bedtime as needed for sleep. Take immediately before bedtime Qty: 30 tablet, Refills: 4    ferrous sulfate 325 (65 FE) MG tablet Take 325 mg by mouth daily with breakfast.    Multiple Vitamin (MULTIVITAMIN) tablet Take 1 tablet by mouth daily.    valsartan-hydrochlorothiazide (DIOVAN HCT) 320-12.5 MG tablet Take 1 tablet by mouth daily. Qty: 90 tablet, Refills: 3      STOP taking these medications     esomeprazole (NEXIUM) 20 MG capsule               Today   CHIEF COMPLAINT:   doing well this morning.  He wants to go home. He denies dark colored stools. No abdominal pain.   VITAL SIGNS:  Blood pressure 138/73, pulse 85, temperature 98.4 F (36.9 C), temperature source Oral, resp. rate 18, height 5\' 9"  (1.753 m), weight 87.1 kg (192 lb), SpO2 99 %.   REVIEW OF SYSTEMS:  Review of Systems  Constitutional: Negative.  Negative for chills, fever and malaise/fatigue.  HENT: Negative.  Negative for ear discharge, ear pain, hearing loss, nosebleeds and sore throat.   Eyes: Negative.  Negative for blurred vision and pain.  Respiratory: Negative.  Negative for cough, hemoptysis, shortness of breath and wheezing.   Cardiovascular:  Negative.  Negative for chest pain, palpitations and leg swelling.  Gastrointestinal: Negative.  Negative for abdominal pain, blood in stool, diarrhea, nausea and vomiting.  Genitourinary: Negative.  Negative for dysuria.  Musculoskeletal: Negative.  Negative for back pain.  Skin: Negative.   Neurological: Negative for dizziness, tremors, speech change, focal weakness, seizures and headaches.  Endo/Heme/Allergies: Negative.  Does not bruise/bleed easily.  Psychiatric/Behavioral: Negative.  Negative for depression, hallucinations and suicidal ideas.     PHYSICAL EXAMINATION:  GENERAL:  75 y.o.-year-old patient lying in the bed with no acute distress.  NECK:  Supple, no jugular venous distention. No thyroid enlargement, no tenderness.  LUNGS: Normal breath sounds bilaterally, no wheezing, rales,rhonchi  No use of accessory muscles of respiration.  CARDIOVASCULAR: S1, S2 normal. No murmurs, rubs, or gallops.  ABDOMEN: Soft, non-tender, non-distended. Bowel sounds present. No organomegaly or mass.  EXTREMITIES: No pedal edema, cyanosis, or clubbing.  PSYCHIATRIC: The patient is alert and oriented x 3.  SKIN: No obvious rash, lesion, or ulcer.   DATA REVIEW:   CBC  Recent Labs Lab 10/29/16 0413  WBC 4.6  HGB 8.4*  HCT 25.5*  PLT 192    Chemistries   Recent Labs Lab 10/27/16 0933 10/28/16 0452  NA 134* 138  K 4.5 3.9  CL 102 111  CO2 20* 20*  GLUCOSE 172* 153*  BUN 41* 23*  CREATININE 1.82* 1.24  CALCIUM 9.5 8.6*  AST 40  --   ALT 27  --   ALKPHOS 58  --   BILITOT 0.6  --     Cardiac Enzymes  Recent Labs Lab 10/25/16 1125  TROPONINI 0.02    Microbiology Results  @[email or web address] @  RADIOLOGY:  No results found.    Management plans discussed with the patient and he is in agreement. Stable for discharge home  Patient should follow up with pcp  CODE STATUS:     Code Status Orders        Start     Ordered   10/27/16 1334  Full code  Continuous      10/27/16 1333    Code Status History    Date Active Date Inactive Code Status Order ID Comments User Context   This patient has a current code status but no historical code status.      TOTAL TIME TAKING CARE OF THIS PATIENT: 37 minutes.    Note: This dictation was prepared with Dragon dictation along with smaller phrase technology. Any transcriptional errors that result from this process are unintentional.  Eliakim Tendler M.D on 10/29/2016 at 10:58 AM  Between 7am to 6pm - Pager - [phone] After 6pm go to [email or web address] - [facility]  Office  [phone]  CC: Primary care physician; Owens Loffler, MD

## 2016-10-29 NOTE — Care Management Important Message (Signed)
Important Message  Patient Details  Name: KABREN PENALVER MRN: [id number] Date of Birth: 31-Oct-1941   Medicare Important Message Given:  N/A - LOS <3 / Initial given by admissions    Beverly Sessions, RN 10/29/2016, 11:10 AM

## 2016-11-01 ENCOUNTER — Telehealth: Payer: Self-pay

## 2016-11-01 NOTE — Telephone Encounter (Signed)
Spoke with pt and he states that he is doing very well. Pt feels like his strength is returning and he denies any blood in stool or abdominal pain.   Pt scheduled to see Dr. Lorelei Pont 11/04/16.     Transition Care Management Follow-up Telephone Call  How have you been since you were released from the hospital? Good, feels like he is improving   Do you understand why you were in the hospital? no   Do you understand the discharge instrcutions? yes  Items Reviewed:  Medications reviewed: yes  Allergies reviewed: yes  Dietary changes reviewed: yes  Referrals reviewed: yes   Functional Questionnaire:   Activities of Daily Living (ADLs):   He states they are independent in the following: ambulation, bathing and hygiene, feeding, continence, grooming, toileting and dressing States they require assistance with the following: none   Any transportation issues/concerns?: no   Any patient concerns? no   Confirmed importance and date/time of follow-up visits scheduled: yes   Confirmed with patient if condition begins to worsen call PCP or go to the ER.  Patient was given the Call-a-Nurse line [phone]: yes

## 2016-11-04 ENCOUNTER — Ambulatory Visit (INDEPENDENT_AMBULATORY_CARE_PROVIDER_SITE_OTHER): Payer: Medicare Other | Admitting: Family Medicine

## 2016-11-04 ENCOUNTER — Encounter: Payer: Self-pay | Admitting: Family Medicine

## 2016-11-04 VITALS — BP 132/70 | HR 99 | Temp 98.4°F | Ht 69.0 in | Wt 194.5 lb

## 2016-11-04 DIAGNOSIS — D500 Iron deficiency anemia secondary to blood loss (chronic): Secondary | ICD-10-CM

## 2016-11-04 DIAGNOSIS — I6523 Occlusion and stenosis of bilateral carotid arteries: Secondary | ICD-10-CM | POA: Diagnosis not present

## 2016-11-04 DIAGNOSIS — K2901 Acute gastritis with bleeding: Secondary | ICD-10-CM | POA: Diagnosis not present

## 2016-11-04 LAB — CBC WITH DIFFERENTIAL/PLATELET
BASOS PCT: 1 % (ref 0.0–3.0)
Basophils Absolute: 0 10*3/uL (ref 0.0–0.1)
EOS PCT: 5.3 % — AB (ref 0.0–5.0)
Eosinophils Absolute: 0.3 10*3/uL (ref 0.0–0.7)
HCT: 30.9 % — ABNORMAL LOW (ref 39.0–52.0)
Hemoglobin: 10.1 g/dL — ABNORMAL LOW (ref 13.0–17.0)
LYMPHS ABS: 1.1 10*3/uL (ref 0.7–4.0)
Lymphocytes Relative: 21.9 % (ref 12.0–46.0)
MCHC: 32.5 g/dL (ref 30.0–36.0)
MCV: 79.1 fl (ref 78.0–100.0)
MONO ABS: 0.7 10*3/uL (ref 0.1–1.0)
Monocytes Relative: 14.3 % — ABNORMAL HIGH (ref 3.0–12.0)
NEUTROS PCT: 57.5 % (ref 43.0–77.0)
Neutro Abs: 2.9 10*3/uL (ref 1.4–7.7)
Platelets: 265 10*3/uL (ref 150.0–400.0)
RBC: 3.91 Mil/uL — ABNORMAL LOW (ref 4.22–5.81)
RDW: 21.3 % — AB (ref 11.5–15.5)
WBC: 5.1 10*3/uL (ref 4.0–10.5)

## 2016-11-04 NOTE — Progress Notes (Signed)
Pre visit review using our clinic review tool, if applicable. No additional management support is needed unless otherwise documented below in the visit note. 

## 2016-11-04 NOTE — Progress Notes (Signed)
Dr. Frederico Hamman T. Vernel Donlan, MD, [facility] [place], [address] Phone: [phone] Fax: [phone]  11/04/2016  Patient: Devin Garza, MRN: [id number], DOB: 02-04-1941, 75 y.o.  Primary Physician:  Owens Loffler, MD   Chief Complaint  Patient presents with  . Hospitalization Follow-up    GI Bleed   Subjective:   Devin Garza is a 75 y.o. very pleasant male patient who presents with the following:  Initially, I seen the patient on October 25, 2016.  At that point he was tachycardic to 120 and was really not feeling well and short of breath.  We have been initially concerned about potential a cardiac event, but I did check a set of multiple different laboratories, and the patient's hemoglobin was in the 7 range.  He also saw cardiology, in between cardiology, myself, and 1 a mother partners, we suggested he go to the hospital.  He declined at that point, but showed up the hospital the following morning and was found have a GI bleed and required blood transfusion.  After transfusion and after upper endoscopy by Dr. Vira Agar, the patient stabilized and felt much better.  Upper GI bleed with melanotic stools and acute blood loss anemia EGD with 7 small erosions in antrum, 2 small AVM in stomach. Dr Tiffany Kocher put two clips on one and one in duodenum  treated with argon and clipped twice  SOB and tachy is gone now.  Went in to hospital on Wed morning.   DATE OF ADMISSION:  10/27/2016    ADMITTING PHYSICIAN: Henreitta Leber, MD  DATE OF DISCHARGE: 10/29/2016  Acute renal failure, unspecified acute renal failure type ([place]) [N17.9] Gastrointestinal hemorrhage, unspecified gastrointestinal hemorrhage type [K92.2] Anemia, unspecified type [D64.9]  Past Medical History, Surgical History, Social History, Family History, Problem List, Medications, and Allergies have been reviewed and updated if relevant.  Patient Active Problem List    Diagnosis Date Noted  . GI bleed 10/27/2016  . Gastritis   . Angiodysplasia of stomach and duodenum without hemorrhage   . Polyp of stomach and duodenum   . Benign neoplasm of transverse colon   . Benign neoplasm of descending colon   . Esophageal reflux 05/28/2015  . Tubular adenoma of colon 05/28/2015  . Prediabetes 04/21/2011  . ALCOHOL ABUSE 01/21/2011  . TRANSAMINASES, SERUM, ELEVATED 01/21/2011  . Anemia 11/12/2009  . CARCINOMA, BASAL CELL 09/03/2008  . CAROTID ARTERY STENOSIS, BILATERAL 08/28/2008  . INSOMNIA UNSPECIFIED 08/28/2008  . Hyperlipidemia 08/27/2008  . Essential hypertension 08/27/2008    Past Medical History:  Diagnosis Date  . Anemia   . Basal cell carcinoma of lip 2007  . ED (erectile dysfunction)   . HLD (hyperlipidemia)   . Hypertension   . Osteoarthritis   . Tobacco abuse   . Tubular adenoma of colon 2005  . Wears dentures    full upper and lower    Past Surgical History:  Procedure Laterality Date  . ANKLE ARTHROSCOPY W/ OPEN REPAIR    . COLONOSCOPY  2006   [place]-Tubular adenoma  . COLONOSCOPY N/A 07/23/2015   Procedure: COLONOSCOPY;  Surgeon: Lucilla Lame, MD;  Location: [facility];  Service: Gastroenterology;  Laterality: N/A;  WITH BIOPSIES---- TRANSVERSE COLON POLYP DESCENDING COLON POLYP  X  4  . ESOPHAGOGASTRODUODENOSCOPY N/A 07/23/2015   Procedure: ESOPHAGOGASTRODUODENOSCOPY (EGD);  Surgeon: Lucilla Lame, MD;  Location: [facility];  Service: Gastroenterology;  Laterality: N/A;  WITH BIOPSY--- DUODENAL BIOPSY  GASTRIC BIOPSY  . ESOPHAGOGASTRODUODENOSCOPY N/A 10/28/2016   Procedure: ESOPHAGOGASTRODUODENOSCOPY (EGD);  Surgeon: Manya Silvas, MD;  Location[facility] ENDOSCOPY;  Service: Endoscopy;  Laterality: N/A;  . SKIN CANCER EXCISION     LIP  . UPPER GI ENDOSCOPY  12/2009   Dr. Ivory Broad gastritisl negative for H. pylori; duodenal biopsies neg for sprue    Social History   Social History  . Marital status:  Single    Spouse name: N/A  . Number of children: 2  . Years of education: N/A   Occupational History  . retired [employer] Retired   Social History Main Topics  . Smoking status: Current Every Day Smoker    Packs/day: 0.50    Years: 40.00    Types: Cigarettes  . Smokeless tobacco: Never Used     Comment: cigars occassionally  . Alcohol use 9.6 oz/week    14 Shots of liquor, 2 Standard drinks or equivalent per week     Comment: 2 drinks whiskey daily  . Drug use: No  . Sexual activity: Not on file   Other Topics Concern  . Not on file   Social History Narrative   Lives alone    Family History  Problem Relation Age of Onset  . Cancer Mother 41    LUNG  . Cancer Father     THROAT  . Hearing loss Father     HEART ATTACK, CAD  . Cancer Sister     OVARIAN    No Known Allergies  Medication list reviewed and updated in full in [facility].   GEN: No acute illnesses, no fevers, chills. GI: No n/v/d, eating normally Pulm: No SOB Interactive and getting along well at home.  Otherwise, ROS is as per the HPI.  Objective:   BP 132/70   Pulse 99   Temp 98.4 F (36.9 C) (Oral)   Ht 5\' 9"  (1.753 m)   Wt 194 lb 8 oz (88.2 kg)   BMI 28.72 kg/m   GEN: WDWN, NAD, Non-toxic, A & O x 3 HEENT: Atraumatic, Normocephalic. Neck supple. No masses, No LAD. Ears and Nose: No external deformity. CV: RRR, No M/G/R. No JVD. No thrill. No extra heart sounds. PULM: CTA B, no wheezes, crackles, rhonchi. No retractions. No resp. distress. No accessory muscle use. EXTR: No c/c/e NEURO Normal gait.  PSYCH: Normally interactive. Conversant. Not depressed or anxious appearing.  Calm demeanor.   Laboratory and Imaging Data: Results for orders placed or performed in visit on 11/04/16  CBC with Differential/Platelet  Result Value Ref Range   WBC 5.1 4.0 - 10.5 K/uL   RBC 3.91 (L) 4.22 - 5.81 Mil/uL   Hemoglobin 10.1 (L) 13.0 - 17.0 g/dL   HCT 30.9 (L) 39.0 - 52.0 %    MCV 79.1 78.0 - 100.0 fl   MCHC 32.5 30.0 - 36.0 g/dL   RDW 21.3 (H) 11.5 - 15.5 %   Platelets 265.0 150.0 - 400.0 K/uL   Neutrophils Relative % 57.5 43.0 - 77.0 %   Lymphocytes Relative 21.9 12.0 - 46.0 %   Monocytes Relative 14.3 (H) 3.0 - 12.0 %   Eosinophils Relative 5.3 (H) 0.0 - 5.0 %   Basophils Relative 1.0 0.0 - 3.0 %   Neutro Abs 2.9 1.4 - 7.7 K/uL   Lymphs Abs 1.1 0.7 - 4.0 K/uL   Monocytes Absolute 0.7 0.1 - 1.0 K/uL   Eosinophils Absolute 0.3 0.0 - 0.7 K/uL   Basophils Absolute 0.0 0.0 -  0.1 K/uL     Assessment and Plan:   Gastrointestinal hemorrhage associated with acute gastritis - Plan: CBC with Differential/Platelet, CBC with Differential/Platelet  Iron deficiency anemia due to chronic blood loss - Plan: CBC with Differential/Platelet, CBC with Differential/Platelet  At this time, the patient's follow-up CBC has returned and his hemoglobin is greater than 10. He feels much better, he is no longer tachycardic, and he is not having any shortness of breath. We will recheck his CBC in 3 months.  The renal insufficiency that he showed in the hospital improved after fluid and blood transfusion, and we will follow this again over time.  Follow-up: No Follow-up on file.  Orders Placed This Encounter  Procedures  . CBC with Differential/Platelet  . CBC with Differential/Platelet    Signed,  Frederico Hamman T. Winfield Caba, MD     Medication List       Accurate as of 11/04/16 11:59 PM. Always use your most recent med list.          atorvastatin 10 MG tablet Commonly known as:  LIPITOR Take 1 tablet (10 mg total) by mouth daily.   buPROPion 150 MG 24 hr tablet Commonly known as:  WELLBUTRIN XL Take 1 tablet (150 mg total) by mouth daily.   eszopiclone 2 MG Tabs tablet Commonly known as:  LUNESTA Take 1 tablet (2 mg total) by mouth at bedtime as needed for sleep. Take immediately before bedtime   ferrous sulfate 325 (65 FE) MG tablet Take 325 mg by mouth daily  with breakfast.   multivitamin tablet Take 1 tablet by mouth daily.   pantoprazole 40 MG tablet Commonly known as:  PROTONIX Take 1 tablet (40 mg total) by mouth 2 (two) times daily.   valsartan-hydrochlorothiazide 320-12.5 MG tablet Commonly known as:  DIOVAN HCT Take 1 tablet by mouth daily.

## 2016-11-08 DIAGNOSIS — L578 Other skin changes due to chronic exposure to nonionizing radiation: Secondary | ICD-10-CM | POA: Diagnosis not present

## 2016-11-08 DIAGNOSIS — L570 Actinic keratosis: Secondary | ICD-10-CM | POA: Diagnosis not present

## 2016-11-08 DIAGNOSIS — C44212 Basal cell carcinoma of skin of right ear and external auricular canal: Secondary | ICD-10-CM | POA: Diagnosis not present

## 2016-11-10 DIAGNOSIS — K2901 Acute gastritis with bleeding: Secondary | ICD-10-CM | POA: Diagnosis not present

## 2016-11-10 DIAGNOSIS — K31819 Angiodysplasia of stomach and duodenum without bleeding: Secondary | ICD-10-CM | POA: Diagnosis not present

## 2016-11-10 DIAGNOSIS — Z8719 Personal history of other diseases of the digestive system: Secondary | ICD-10-CM | POA: Insufficient documentation

## 2016-11-25 ENCOUNTER — Encounter: Payer: Self-pay | Admitting: Cardiology

## 2016-11-25 ENCOUNTER — Ambulatory Visit (INDEPENDENT_AMBULATORY_CARE_PROVIDER_SITE_OTHER): Payer: Medicare Other | Admitting: Cardiology

## 2016-11-25 VITALS — BP 140/62 | HR 104 | Ht 70.0 in | Wt 190.5 lb

## 2016-11-25 DIAGNOSIS — R9431 Abnormal electrocardiogram [ECG] [EKG]: Secondary | ICD-10-CM

## 2016-11-25 DIAGNOSIS — I10 Essential (primary) hypertension: Secondary | ICD-10-CM

## 2016-11-25 DIAGNOSIS — R000 Tachycardia, unspecified: Secondary | ICD-10-CM

## 2016-11-25 DIAGNOSIS — I6523 Occlusion and stenosis of bilateral carotid arteries: Secondary | ICD-10-CM

## 2016-11-25 NOTE — Progress Notes (Signed)
Cardiology Office Note   Date:  11/25/2016   ID:  Devin Garza, DOB [birth year], MRN [id number]  Referring Doctor:  Owens Loffler, MD   Cardiologist:   Wende Bushy, MD   Reason for consultation:  Chief Complaint  Patient presents with  . other    Follow up from [facility] with a bleed in stomach and diagnosed with anemia. Meds reviewed by the pt. verbally. Pt. c/o occas. rapid heart beats.       History of Present Illness: Devin Garza is a 75 y.o. male who presents for Follow-up after hospital stay  On last visit, patient was finally encouraged to get admitted to the hospital. He was found to have severe anemia and upper GI bleed.  Since that hospital stay and after blood transfusion, he has felt significantly improved. He does not have any shortness of breath or chest pain. He does not complain of any palpitations. Patient denies lightheadedness, syncope, palpitations.   ROS:  Please see the history of present illness. Aside from mentioned under HPI, all other systems are reviewed and negative.     Past Medical History:  Diagnosis Date  . Anemia   . Basal cell carcinoma of lip 2007  . ED (erectile dysfunction)   . HLD (hyperlipidemia)   . Hypertension   . Osteoarthritis   . Tobacco abuse   . Tubular adenoma of colon 2005  . Wears dentures    full upper and lower    Past Surgical History:  Procedure Laterality Date  . ANKLE ARTHROSCOPY W/ OPEN REPAIR    . COLONOSCOPY  2006   [place]-Tubular adenoma  . COLONOSCOPY N/A 07/23/2015   Procedure: COLONOSCOPY;  Surgeon: Lucilla Lame, MD;  Location: [facility];  Service: Gastroenterology;  Laterality: N/A;  WITH BIOPSIES---- TRANSVERSE COLON POLYP DESCENDING COLON POLYP  X  4  . ESOPHAGOGASTRODUODENOSCOPY N/A 07/23/2015   Procedure: ESOPHAGOGASTRODUODENOSCOPY (EGD);  Surgeon: Lucilla Lame, MD;  Location: [facility];  Service: Gastroenterology;  Laterality: N/A;  WITH BIOPSY--- DUODENAL BIOPSY GASTRIC  BIOPSY  . ESOPHAGOGASTRODUODENOSCOPY N/A 10/28/2016   Procedure: ESOPHAGOGASTRODUODENOSCOPY (EGD);  Surgeon: Manya Silvas, MD;  Location: [facility] ENDOSCOPY;  Service: Endoscopy;  Laterality: N/A;  . SKIN CANCER EXCISION     LIP  . UPPER GI ENDOSCOPY  12/2009   Dr. Ivory Broad gastritisl negative for H. pylori; duodenal biopsies neg for sprue     reports that he has been smoking Cigarettes.  He has a 20.00 pack-year smoking history. He has never used smokeless tobacco. He reports that he drinks about 9.6 oz of alcohol per week . He reports that he does not use drugs.   family history includes Cancer in his father and sister; Cancer (age of onset: 10) in his mother; Hearing loss in his father.   Outpatient Medications Prior to Visit  Medication Sig Dispense Refill  . atorvastatin (LIPITOR) 10 MG tablet Take 1 tablet (10 mg total) by mouth daily. 90 tablet 3  . buPROPion (WELLBUTRIN XL) 150 MG 24 hr tablet Take 1 tablet (150 mg total) by mouth daily. 90 tablet 3  . eszopiclone (LUNESTA) 2 MG TABS tablet Take 1 tablet (2 mg total) by mouth at bedtime as needed for sleep. Take immediately before bedtime 30 tablet 4  . ferrous sulfate 325 (65 FE) MG tablet Take 325 mg by mouth daily with breakfast.    . Multiple Vitamin (MULTIVITAMIN) tablet Take 1 tablet by mouth daily.    . valsartan-hydrochlorothiazide (DIOVAN HCT)  320-12.5 MG tablet Take 1 tablet by mouth daily. 90 tablet 3  . pantoprazole (PROTONIX) 40 MG tablet Take 1 tablet (40 mg total) by mouth 2 (two) times daily. (Patient not taking: Reported on 11/25/2016) 30 tablet 0   No facility-administered medications prior to visit.      Allergies: Patient has no known allergies.    PHYSICAL EXAM: VS:  BP 140/62 (BP Location: Left Arm, Patient Position: Sitting, Cuff Size: Normal)   Pulse (!) 104   Ht 5\' 10"  (1.778 m)   Wt 190 lb 8 oz (86.4 kg)   BMI 27.33 kg/m  , Body mass index is 27.33 kg/m. Wt Readings from Last 3 Encounters:    11/25/16 190 lb 8 oz (86.4 kg)  11/04/16 194 lb 8 oz (88.2 kg)  10/27/16 192 lb (87.1 kg)    GENERAL:  well developed, well nourished, not in acute distress HEENT: normocephalic, pink conjunctivae, anicteric sclerae, no xanthelasma, normal dentition, oropharynx clear NECK:  no neck vein engorgement, JVP normal, no hepatojugular reflux, carotid upstroke brisk and symmetric, no bruit, no thyromegaly, no lymphadenopathy LUNGS:  good respiratory effort, clear to auscultation bilaterally CV:  PMI not displaced, no thrills, no lifts, tachycardic, S1 and S2 within normal limits, no palpable S3 or S4, no murmurs, no rubs, no gallops ABD:  Soft, nontender, nondistended, normoactive bowel sounds, no abdominal aortic bruit, no hepatomegaly, no splenomegaly MS: nontender back, no kyphosis, no scoliosis, no joint deformities EXT:  2+ DP/PT pulses, no edema, no varicosities, no cyanosis, no clubbing SKIN: warm, nondiaphoretic, normal turgor, no ulcers NEUROPSYCH: alert, oriented to person, place, and time, sensory/motor grossly intact, normal mood, appropriate affect  Recent Labs: 10/27/2016: ALT 27 10/28/2016: BUN 23; Creatinine, Ser 1.24; Potassium 3.9; Sodium 138 11/04/2016: Hemoglobin 10.1; Platelets 265.0   Lipid Panel    Component Value Date/Time   CHOL 182 04/19/2016 0825   TRIG 61.0 04/19/2016 0825   HDL 79.00 04/19/2016 0825   CHOLHDL 2 04/19/2016 0825   VLDL 12.2 04/19/2016 0825   LDLCALC 91 04/19/2016 0825   LDLDIRECT 132.0 01/18/2011 0914     Other studies Reviewed:  EKG:  The ekg from 10/26/2016 was personally reviewed by me and it revealed sinus tachycardia, 124 BPM, poor R-wave progression and nonspecific ST depression diffusely.  EKG from 11/25/2016 was personally reviewed by me and it revealed sinus tachycardia, 104 BPM. PVCs.  Additional studies/ records that were reviewed personally reviewed by me today include: None available   ASSESSMENT AND PLAN: Shortness of breath  was resolved post blood transfusion and addressing upper GI bleed Sinus tachycardia - Heart rate much improved post blood transfusion Normal EKG with PVCs Patient is not symptomatic from this. He denies shortness of breath or palpitations. He does not have any chest pain. Recommend echocardiogram. If EF is not normal, recommend further evaluation with Holter to see PVC burden.  Hypertension BP is well controlled. Continue monitoring BP. Continue current medical therapy and lifestyle changes.    Current medicines are reviewed at length with the patient today.  The patient does not have concerns regarding medicines.  Labs/ tests ordered today include:  Orders Placed This Encounter  Procedures  . EKG 12-Lead  . ECHOCARDIOGRAM COMPLETE    I had a lengthy and detailed discussion with the patient regarding diagnoses, prognosis, diagnostic options, treatment options , and side effects of medications.    Disposition:   FU with undersigned prn   Signed, Wende Bushy, MD  11/25/2016 12:54 PM  [place]  This note was generated in part with voice recognition software and I apologize for any typographical errors that were not detected and corrected.

## 2016-11-25 NOTE — Telephone Encounter (Signed)
This encounter was created in error - please disregard.

## 2016-11-25 NOTE — Patient Instructions (Addendum)
Testing/Procedures: Your physician has requested that you have an echocardiogram. Echocardiography is a painless test that uses sound waves to create images of your heart. It provides your doctor with information about the size and shape of your heart and how well your heart's chambers and valves are working. This procedure takes approximately one hour. There are no restrictions for this procedure.    Follow-Up: Your physician recommends that you schedule a follow-up appointment as needed with Dr. Ingal. We will call you with results and if needed schedule follow up at that time.   It was a pleasure seeing you today here in the office. Please do not hesitate to give us a call back if you have any further questions. [phone]  Pamela A. RN, BSN     Echocardiogram An echocardiogram, or echocardiography, uses sound waves (ultrasound) to produce an image of your heart. The echocardiogram is simple, painless, obtained within a short period of time, and offers valuable information to your health care provider. The images from an echocardiogram can provide information such as:  Evidence of coronary artery disease (CAD).  Heart size.  Heart muscle function.  Heart valve function.  Aneurysm detection.  Evidence of a past heart attack.  Fluid buildup around the heart.  Heart muscle thickening.  Assess heart valve function. Tell a health care provider about:  Any allergies you have.  All medicines you are taking, including vitamins, herbs, eye drops, creams, and over-the-counter medicines.  Any problems you or family members have had with anesthetic medicines.  Any blood disorders you have.  Any surgeries you have had.  Any medical conditions you have.  Whether you are pregnant or may be pregnant. What happens before the procedure? No special preparation is needed. Eat and drink normally. What happens during the procedure?  In order to produce an image of your heart, gel  will be applied to your chest and a wand-like tool (transducer) will be moved over your chest. The gel will help transmit the sound waves from the transducer. The sound waves will harmlessly bounce off your heart to allow the heart images to be captured in real-time motion. These images will then be recorded.  You may need an IV to receive a medicine that improves the quality of the pictures. What happens after the procedure? You may return to your normal schedule including diet, activities, and medicines, unless your health care provider tells you otherwise. This information is not intended to replace advice given to you by your health care provider. Make sure you discuss any questions you have with your health care provider. Document Released: 12/10/2000 Document Revised: 07/31/2016 Document Reviewed: 08/20/2013 Elsevier Interactive Patient Education  2017 Elsevier Inc.  

## 2016-11-30 ENCOUNTER — Ambulatory Visit: Payer: Medicare Other | Admitting: Podiatry

## 2016-12-07 ENCOUNTER — Other Ambulatory Visit: Payer: Self-pay

## 2016-12-07 ENCOUNTER — Ambulatory Visit (INDEPENDENT_AMBULATORY_CARE_PROVIDER_SITE_OTHER): Payer: Medicare Other

## 2016-12-07 DIAGNOSIS — I10 Essential (primary) hypertension: Secondary | ICD-10-CM

## 2016-12-07 DIAGNOSIS — R000 Tachycardia, unspecified: Secondary | ICD-10-CM

## 2016-12-07 DIAGNOSIS — R9431 Abnormal electrocardiogram [ECG] [EKG]: Secondary | ICD-10-CM

## 2016-12-08 ENCOUNTER — Telehealth: Payer: Self-pay | Admitting: Cardiology

## 2016-12-08 NOTE — Telephone Encounter (Signed)
Pt just asking when echo results are in please call him

## 2016-12-08 NOTE — Telephone Encounter (Signed)
Reviewed preliminary results with patient and let him know that once Dr. Yvone Neu reviewed we would give him a call if she wants to do a follow up visit or other testing. He verbalized understanding and had no further questions at this time.

## 2016-12-16 ENCOUNTER — Ambulatory Visit (INDEPENDENT_AMBULATORY_CARE_PROVIDER_SITE_OTHER): Payer: Medicare Other | Admitting: Podiatry

## 2016-12-16 ENCOUNTER — Encounter: Payer: Self-pay | Admitting: Podiatry

## 2016-12-16 DIAGNOSIS — B351 Tinea unguium: Secondary | ICD-10-CM | POA: Diagnosis not present

## 2016-12-16 DIAGNOSIS — L851 Acquired keratosis [keratoderma] palmaris et plantaris: Secondary | ICD-10-CM | POA: Diagnosis not present

## 2016-12-16 DIAGNOSIS — M109 Gout, unspecified: Secondary | ICD-10-CM | POA: Diagnosis not present

## 2016-12-16 DIAGNOSIS — L84 Corns and callosities: Secondary | ICD-10-CM

## 2016-12-16 DIAGNOSIS — I6523 Occlusion and stenosis of bilateral carotid arteries: Secondary | ICD-10-CM | POA: Diagnosis not present

## 2016-12-16 DIAGNOSIS — Q828 Other specified congenital malformations of skin: Secondary | ICD-10-CM | POA: Diagnosis not present

## 2016-12-16 DIAGNOSIS — M25572 Pain in left ankle and joints of left foot: Secondary | ICD-10-CM | POA: Diagnosis not present

## 2016-12-16 DIAGNOSIS — L608 Other nail disorders: Secondary | ICD-10-CM

## 2016-12-16 DIAGNOSIS — M79672 Pain in left foot: Secondary | ICD-10-CM

## 2016-12-16 DIAGNOSIS — L603 Nail dystrophy: Secondary | ICD-10-CM

## 2016-12-16 DIAGNOSIS — M79609 Pain in unspecified limb: Secondary | ICD-10-CM | POA: Diagnosis not present

## 2016-12-16 DIAGNOSIS — M79671 Pain in right foot: Secondary | ICD-10-CM

## 2016-12-17 ENCOUNTER — Ambulatory Visit: Payer: Medicare Other | Admitting: Podiatry

## 2016-12-17 LAB — URIC ACID: Uric Acid: 8.8 mg/dL — ABNORMAL HIGH (ref 3.7–8.6)

## 2016-12-17 NOTE — Progress Notes (Signed)
SUBJECTIVE Patient  presents to office today complaining of elongated, thickened nails. Pain while ambulating in shoes. Patient is unable to trim their own nails.  Patient also presents today with a new complaint of pain to the left great toe 1 week. Patient states that approximately one week ago he had a red hot swollen joint in his left great toe. Patient was unable to walk. Patient states that it was painful for for approximately 5 days which time it began to resolve. Patient presents today for further treatment and evaluation  OBJECTIVE General Patient is awake, alert, and oriented x 3 and in no acute distress. Derm painful hyperkeratotic callus lesion also noted to the second digit right foot secondary to friction from the great toenail rubbing of the second toe. Skin is dry and supple bilateral. Negative open lesions or macerations. Remaining integument unremarkable. Nails are tender, long, thickened and dystrophic with subungual debris, consistent with onychomycosis, 1-5 bilateral. No signs of infection noted. Vasc  DP and PT pedal pulses palpable bilaterally. Temperature gradient within normal limits.  Neuro Epicritic and protective threshold sensation diminished bilaterally.  Musculoskeletal Exam moderate pain on palpation noted with range of motion as well to the first MPJ left foot. No symptomatic pedal deformities noted bilateral. Muscular strength within normal limits.  ASSESSMENT 1. Onychodystrophic nails 1-5 bilateral with hyperkeratosis of nails.  2. Onychomycosis of nail due to dermatophyte bilateral 3. Pain in foot bilateral 4. Possible acute gout first MPJ left foot 5. Hyperkeratotic painful callus lesion second digit right foot  PLAN OF CARE 1. Patient evaluated today.  2. Instructed to maintain good pedal hygiene and foot care.  3. Mechanical debridement of nails 1-5 bilaterally performed using a nail nipper. Filed with dremel without incident.  4. Today we discussed the  etiology and management of possible gout. orders for uric acid placed.  5. Excisional debridement of painful callus lesion was performed to the second digit right foot using a chisel blade without incident or bleeding. Silicone toe cap dispensed  6. Return to clinic in 3 mos.    Edrick Kins, DPM [facility]  Dr. Edrick Kins, [address]                                        [place], [place] [address]                Office [phone]  Fax [phone]

## 2016-12-22 ENCOUNTER — Ambulatory Visit: Payer: Medicare Other | Admitting: Podiatry

## 2016-12-24 ENCOUNTER — Telehealth: Payer: Self-pay | Admitting: *Deleted

## 2016-12-24 ENCOUNTER — Other Ambulatory Visit: Payer: Medicare Other

## 2016-12-24 DIAGNOSIS — I10 Essential (primary) hypertension: Secondary | ICD-10-CM

## 2016-12-24 DIAGNOSIS — E785 Hyperlipidemia, unspecified: Secondary | ICD-10-CM

## 2016-12-24 NOTE — Telephone Encounter (Signed)
Spoke with patient and reviewed recommendations. Placed repeat echocardiogram order for 6 months and he verbalized understanding of our conversation with no further questions at this time.

## 2016-12-24 NOTE — Telephone Encounter (Signed)
-----   Message from Wende Bushy, MD sent at 12/24/2016  1:54 PM EST ----- LVEF okay. He the ascending was 39 mm. Recommend re-eval with echo in 6 months.

## 2017-03-10 ENCOUNTER — Ambulatory Visit: Payer: Medicare Other | Admitting: Podiatry

## 2017-03-11 ENCOUNTER — Ambulatory Visit (INDEPENDENT_AMBULATORY_CARE_PROVIDER_SITE_OTHER): Payer: Medicare Other | Admitting: Podiatry

## 2017-03-11 ENCOUNTER — Encounter: Payer: Self-pay | Admitting: Podiatry

## 2017-03-11 DIAGNOSIS — L603 Nail dystrophy: Secondary | ICD-10-CM

## 2017-03-11 DIAGNOSIS — L608 Other nail disorders: Secondary | ICD-10-CM

## 2017-03-11 DIAGNOSIS — B351 Tinea unguium: Secondary | ICD-10-CM | POA: Diagnosis not present

## 2017-03-11 DIAGNOSIS — M79609 Pain in unspecified limb: Secondary | ICD-10-CM | POA: Diagnosis not present

## 2017-03-11 NOTE — Progress Notes (Signed)
   SUBJECTIVE Patient  presents to office today complaining of elongated, thickened nails. Pain while ambulating in shoes. Patient is unable to trim their own nails.   OBJECTIVE General Patient is awake, alert, and oriented x 3 and in no acute distress. Derm Skin is dry and supple bilateral. Negative open lesions or macerations. Remaining integument unremarkable. Nails are tender, long, thickened and dystrophic with subungual debris, consistent with onychomycosis, 1-5 bilateral. No signs of infection noted. Vasc  DP and PT pedal pulses palpable bilaterally. Temperature gradient within normal limits.  Neuro Epicritic and protective threshold sensation diminished bilaterally.  Musculoskeletal Exam No symptomatic pedal deformities noted bilateral. Muscular strength within normal limits.  ASSESSMENT 1. Onychodystrophic nails 1-5 bilateral with hyperkeratosis of nails.  2. Onychomycosis of nail due to dermatophyte bilateral 3. Pain in foot bilateral  PLAN OF CARE 1. Patient evaluated today.  2. Instructed to maintain good pedal hygiene and foot care.  3. Mechanical debridement of nails 1-5 bilaterally performed using a nail nipper. Filed with dremel without incident.  4. Return to clinic in 3 mos.  5. Uric acid results were reviewed today which were elevated for uric acid levels.   Edrick Kins, DPM [facility]  Dr. Edrick Kins, [address]                                        [place], [place] [address]                Office [phone]  Fax [phone]

## 2017-03-16 DIAGNOSIS — L570 Actinic keratosis: Secondary | ICD-10-CM | POA: Diagnosis not present

## 2017-03-16 DIAGNOSIS — Z85828 Personal history of other malignant neoplasm of skin: Secondary | ICD-10-CM | POA: Diagnosis not present

## 2017-03-16 DIAGNOSIS — D692 Other nonthrombocytopenic purpura: Secondary | ICD-10-CM | POA: Diagnosis not present

## 2017-03-16 DIAGNOSIS — Z1283 Encounter for screening for malignant neoplasm of skin: Secondary | ICD-10-CM | POA: Diagnosis not present

## 2017-03-16 DIAGNOSIS — D485 Neoplasm of uncertain behavior of skin: Secondary | ICD-10-CM | POA: Diagnosis not present

## 2017-03-16 DIAGNOSIS — D229 Melanocytic nevi, unspecified: Secondary | ICD-10-CM | POA: Diagnosis not present

## 2017-03-16 DIAGNOSIS — L812 Freckles: Secondary | ICD-10-CM | POA: Diagnosis not present

## 2017-03-16 DIAGNOSIS — L821 Other seborrheic keratosis: Secondary | ICD-10-CM | POA: Diagnosis not present

## 2017-03-16 DIAGNOSIS — L578 Other skin changes due to chronic exposure to nonionizing radiation: Secondary | ICD-10-CM | POA: Diagnosis not present

## 2017-03-16 DIAGNOSIS — D1800 Hemangioma unspecified site: Secondary | ICD-10-CM | POA: Diagnosis not present

## 2017-03-16 DIAGNOSIS — C44212 Basal cell carcinoma of skin of right ear and external auricular canal: Secondary | ICD-10-CM | POA: Diagnosis not present

## 2017-03-18 ENCOUNTER — Other Ambulatory Visit (INDEPENDENT_AMBULATORY_CARE_PROVIDER_SITE_OTHER): Payer: Medicare Other

## 2017-03-18 DIAGNOSIS — D500 Iron deficiency anemia secondary to blood loss (chronic): Secondary | ICD-10-CM

## 2017-03-18 DIAGNOSIS — K2901 Acute gastritis with bleeding: Secondary | ICD-10-CM

## 2017-03-18 LAB — CBC WITH DIFFERENTIAL/PLATELET
BASOS PCT: 0.9 % (ref 0.0–3.0)
Basophils Absolute: 0.1 10*3/uL (ref 0.0–0.1)
EOS PCT: 5.4 % — AB (ref 0.0–5.0)
Eosinophils Absolute: 0.3 10*3/uL (ref 0.0–0.7)
HEMATOCRIT: 37.8 % — AB (ref 39.0–52.0)
Hemoglobin: 12.7 g/dL — ABNORMAL LOW (ref 13.0–17.0)
Lymphocytes Relative: 24.5 % (ref 12.0–46.0)
Lymphs Abs: 1.5 10*3/uL (ref 0.7–4.0)
MCHC: 33.6 g/dL (ref 30.0–36.0)
MCV: 88.2 fl (ref 78.0–100.0)
MONO ABS: 0.8 10*3/uL (ref 0.1–1.0)
Monocytes Relative: 13.2 % — ABNORMAL HIGH (ref 3.0–12.0)
Neutro Abs: 3.3 10*3/uL (ref 1.4–7.7)
Neutrophils Relative %: 56 % (ref 43.0–77.0)
Platelets: 232 10*3/uL (ref 150.0–400.0)
RBC: 4.29 Mil/uL (ref 4.22–5.81)
RDW: 14 % (ref 11.5–15.5)
WBC: 5.9 10*3/uL (ref 4.0–10.5)

## 2017-03-21 ENCOUNTER — Encounter: Payer: Self-pay | Admitting: Family Medicine

## 2017-03-21 ENCOUNTER — Ambulatory Visit (INDEPENDENT_AMBULATORY_CARE_PROVIDER_SITE_OTHER): Payer: Medicare Other | Admitting: Family Medicine

## 2017-03-21 VITALS — BP 140/84 | HR 105 | Temp 98.7°F | Ht 69.0 in | Wt 195.5 lb

## 2017-03-21 DIAGNOSIS — M19071 Primary osteoarthritis, right ankle and foot: Secondary | ICD-10-CM

## 2017-03-21 DIAGNOSIS — M109 Gout, unspecified: Secondary | ICD-10-CM | POA: Insufficient documentation

## 2017-03-21 DIAGNOSIS — M1000 Idiopathic gout, unspecified site: Secondary | ICD-10-CM

## 2017-03-21 MED ORDER — COLCHICINE 0.6 MG PO CAPS: 1.0000 | ORAL_CAPSULE | Freq: Two times a day (BID) | ORAL | 3 refills | Status: DC | PRN

## 2017-03-21 MED ORDER — DICLOFENAC SODIUM 1.5 % TD SOLN: TRANSDERMAL | 11 refills | Status: DC

## 2017-03-21 NOTE — Progress Notes (Signed)
Pre visit review using our clinic review tool, if applicable. No additional management support is needed unless otherwise documented below in the visit note. 

## 2017-03-21 NOTE — Progress Notes (Signed)
Dr. Frederico Hamman T. Madhav Mohon, MD, [facility] [place], [address] Phone: [phone] Fax: [phone]  03/21/2017  Patient: Devin Garza, MRN: [id number], DOB: [birth year], 76 y.o.  Primary Physician:  Owens Loffler, MD   Chief Complaint  Patient presents with  . Foot Pain    Right   Subjective:   Devin Garza is a 76 y.o. very pleasant male patient who presents with the following:  R foot pain: h/o R ankle trauma distantly with fracture. Had some swelling over the weekend and last week with increased pain, which has decreased over the last few days.   Colchicine. For gout. Has a history of gout - now not taking nsaids due to GI bleed last year.   Past Medical History, Surgical History, Social History, Family History, Problem List, Medications, and Allergies have been reviewed and updated if relevant.  Patient Active Problem List   Diagnosis Date Noted  . GI bleed 10/27/2016  . Gastritis   . Angiodysplasia of stomach and duodenum without hemorrhage   . Polyp of stomach and duodenum   . Benign neoplasm of transverse colon   . Benign neoplasm of descending colon   . Esophageal reflux 05/28/2015  . Tubular adenoma of colon 05/28/2015  . Prediabetes 04/21/2011  . ALCOHOL ABUSE 01/21/2011  . TRANSAMINASES, SERUM, ELEVATED 01/21/2011  . Anemia 11/12/2009  . CARCINOMA, BASAL CELL 09/03/2008  . CAROTID ARTERY STENOSIS, BILATERAL 08/28/2008  . INSOMNIA UNSPECIFIED 08/28/2008  . Hyperlipidemia 08/27/2008  . Essential hypertension 08/27/2008    Past Medical History:  Diagnosis Date  . Anemia   . Basal cell carcinoma of lip 2007  . ED (erectile dysfunction)   . HLD (hyperlipidemia)   . Hypertension   . Osteoarthritis   . Tobacco abuse   . Tubular adenoma of colon 2005  . Wears dentures    full upper and lower    Past Surgical History:  Procedure Laterality Date  . ANKLE ARTHROSCOPY W/ OPEN REPAIR    .  COLONOSCOPY  2006   [place]-Tubular adenoma  . COLONOSCOPY N/A 07/23/2015   Procedure: COLONOSCOPY;  Surgeon: Lucilla Lame, MD;  Location: [facility];  Service: Gastroenterology;  Laterality: N/A;  WITH BIOPSIES---- TRANSVERSE COLON POLYP DESCENDING COLON POLYP  X  4  . ESOPHAGOGASTRODUODENOSCOPY N/A 07/23/2015   Procedure: ESOPHAGOGASTRODUODENOSCOPY (EGD);  Surgeon: Lucilla Lame, MD;  Location: [facility];  Service: Gastroenterology;  Laterality: N/A;  WITH BIOPSY--- DUODENAL BIOPSY GASTRIC BIOPSY  . ESOPHAGOGASTRODUODENOSCOPY N/A 10/28/2016   Procedure: ESOPHAGOGASTRODUODENOSCOPY (EGD);  Surgeon: Manya Silvas, MD;  Location: [facility] ENDOSCOPY;  Service: Endoscopy;  Laterality: N/A;  . SKIN CANCER EXCISION     LIP  . UPPER GI ENDOSCOPY  12/2009   Dr. Ivory Broad gastritisl negative for H. pylori; duodenal biopsies neg for sprue    Social History   Social History  . Marital status: Single    Spouse name: N/A  . Number of children: 2  . Years of education: N/A   Occupational History  . retired [employer] Retired   Social History Main Topics  . Smoking status: Current Every Day Smoker    Packs/day: 0.50    Years: 40.00    Types: Cigarettes  . Smokeless tobacco: Never Used     Comment: cigars occassionally  . Alcohol use 9.6 oz/week    14 Shots of liquor, 2 Standard drinks or equivalent per week     Comment: 2 drinks  whiskey daily  . Drug use: No  . Sexual activity: Not on file   Other Topics Concern  . Not on file   Social History Narrative   Lives alone    Family History  Problem Relation Age of Onset  . Cancer Mother 6    LUNG  . Cancer Father     THROAT  . Hearing loss Father     HEART ATTACK, CAD  . Cancer Sister     OVARIAN    No Known Allergies  Medication list reviewed and updated in full in [facility].  GEN: No fevers, chills. Nontoxic. Primarily MSK c/o today. MSK: Detailed in the HPI GI: tolerating PO intake  without difficulty Neuro: No numbness, parasthesias, or tingling associated. Otherwise the pertinent positives of the ROS are noted above.   Objective:   Pulse (!) 105   Temp 98.7 F (37.1 C) (Oral)   Ht 5\' 9"  (1.753 m)   Wt 195 lb 8 oz (88.7 kg)   BMI 28.87 kg/m    GEN: WDWN, NAD, Non-toxic, Alert & Oriented x 3 HEENT: Atraumatic, Normocephalic.  Ears and Nose: No external deformity. EXTR: No clubbing/cyanosis/edema NEURO: Normal gait.  PSYCH: Normally interactive. Conversant. Not depressed or anxious appearing.  Calm demeanor.   FEET: R Echymosis: no Edema: mild ankle ROM: modest restruction Gait: heel toe, non-antalgic MT pain: no Callus pattern: none Lateral Mall: NT Medial Mall: NT Talus: NT Navicular: NT Cuboid: NT Calcaneous: NT Metatarsals: NT 5th MT: NT Phalanges: NT Achilles: NT Plantar Fascia: NT Fat Pad: NT Peroneals: NT Post Tib: NT Great Toe: dramatic decreased motion Ant Drawer: neg ATFL: NT CFL: NT Deltoid: NT Other foot breakdown: none Long arch: extensive breakdown Transverse arch: extensive breakdown Hindfoot breakdown: none Sensation: intact   Radiology: No results found.  Assessment and Plan:   Arthritis of right ankle  Idiopathic gout, unspecified chronicity, unspecified site  Compression sleeve with golf Prn topical nsaids - no oral  Colchicine if gout flare  Follow-up: prn  Meds ordered this encounter  Medications  . esomeprazole (NEXIUM) 20 MG capsule    Sig: Take 20 mg by mouth 4 (four) times daily.  . Colchicine (MITIGARE) 0.6 MG CAPS    Sig: Take 1 tablet by mouth 2 (two) times daily as needed.    Dispense:  60 capsule    Refill:  3  . Diclofenac Sodium 1.5 % SOLN    Sig: Apply to affected joints QID as needed    Dispense:  3 Bottle    Refill:  11   Medications Discontinued During This Encounter  Medication Reason  . esomeprazole (NEXIUM) 40 MG capsule Change in therapy    Signed,  Gypsy Kellogg T.  Zeb Rawl, MD   Allergies as of 03/21/2017   No Known Allergies     Medication List       Accurate as of 03/21/17  9:13 AM. Always use your most recent med list.          atorvastatin 10 MG tablet Commonly known as:  LIPITOR Take 1 tablet (10 mg total) by mouth daily.   buPROPion 150 MG 24 hr tablet Commonly known as:  WELLBUTRIN XL Take 1 tablet (150 mg total) by mouth daily.   Colchicine 0.6 MG Caps Commonly known as:  MITIGARE Take 1 tablet by mouth 2 (two) times daily as needed.   Diclofenac Sodium 1.5 % Soln Apply to affected joints QID as needed   esomeprazole 20 MG capsule Commonly  known as:  NEXIUM Take 20 mg by mouth 4 (four) times daily.   eszopiclone 2 MG Tabs tablet Commonly known as:  LUNESTA Take 1 tablet (2 mg total) by mouth at bedtime as needed for sleep. Take immediately before bedtime   ferrous sulfate 325 (65 FE) MG tablet Take 325 mg by mouth daily with breakfast.   multivitamin tablet Take 1 tablet by mouth daily.   valsartan-hydrochlorothiazide 320-12.5 MG tablet Commonly known as:  DIOVAN HCT Take 1 tablet by mouth daily.

## 2017-03-25 ENCOUNTER — Ambulatory Visit: Payer: Medicare Other | Admitting: Podiatry

## 2017-04-19 ENCOUNTER — Telehealth: Payer: Self-pay

## 2017-04-19 NOTE — Telephone Encounter (Signed)
lmov to schedule 1 yr fu carotid

## 2017-04-22 ENCOUNTER — Other Ambulatory Visit (INDEPENDENT_AMBULATORY_CARE_PROVIDER_SITE_OTHER): Payer: Medicare Other

## 2017-04-22 ENCOUNTER — Encounter (INDEPENDENT_AMBULATORY_CARE_PROVIDER_SITE_OTHER): Payer: Self-pay

## 2017-04-22 DIAGNOSIS — Z125 Encounter for screening for malignant neoplasm of prostate: Secondary | ICD-10-CM | POA: Diagnosis not present

## 2017-04-22 DIAGNOSIS — E784 Other hyperlipidemia: Secondary | ICD-10-CM

## 2017-04-22 DIAGNOSIS — Z79899 Other long term (current) drug therapy: Secondary | ICD-10-CM

## 2017-04-22 DIAGNOSIS — E7849 Other hyperlipidemia: Secondary | ICD-10-CM

## 2017-04-22 DIAGNOSIS — R7303 Prediabetes: Secondary | ICD-10-CM

## 2017-04-22 LAB — HEPATIC FUNCTION PANEL
ALBUMIN: 4.2 g/dL (ref 3.5–5.2)
ALT: 31 U/L (ref 0–53)
AST: 30 U/L (ref 0–37)
Alkaline Phosphatase: 52 U/L (ref 39–117)
BILIRUBIN TOTAL: 0.5 mg/dL (ref 0.2–1.2)
Bilirubin, Direct: 0.1 mg/dL (ref 0.0–0.3)
TOTAL PROTEIN: 6.7 g/dL (ref 6.0–8.3)

## 2017-04-22 LAB — BASIC METABOLIC PANEL
BUN: 29 mg/dL — ABNORMAL HIGH (ref 6–23)
CALCIUM: 9.3 mg/dL (ref 8.4–10.5)
CO2: 26 mEq/L (ref 19–32)
Chloride: 104 mEq/L (ref 96–112)
Creatinine, Ser: 1.23 mg/dL (ref 0.40–1.50)
GFR: 60.71 mL/min (ref >60.00)
Glucose, Bld: 139 mg/dL — ABNORMAL HIGH (ref 70–99)
POTASSIUM: 4.4 meq/L (ref 3.5–5.1)
SODIUM: 138 meq/L (ref 135–145)

## 2017-04-22 LAB — CBC WITH DIFFERENTIAL/PLATELET
BASOS ABS: 0.1 10*3/uL (ref 0.0–0.1)
Basophils Relative: 1.3 % (ref 0.0–3.0)
EOS ABS: 0.4 10*3/uL (ref 0.0–0.7)
Eosinophils Relative: 7.3 % — ABNORMAL HIGH (ref 0.0–5.0)
HCT: 38.8 % — ABNORMAL LOW (ref 39.0–52.0)
Hemoglobin: 13.3 g/dL (ref 13.0–17.0)
LYMPHS ABS: 1.3 10*3/uL (ref 0.7–4.0)
Lymphocytes Relative: 25.1 % (ref 12.0–46.0)
MCHC: 34.4 g/dL (ref 30.0–36.0)
MCV: 89.5 fl (ref 78.0–100.0)
MONO ABS: 0.6 10*3/uL (ref 0.1–1.0)
Monocytes Relative: 11.7 % (ref 3.0–12.0)
Neutro Abs: 2.9 10*3/uL (ref 1.4–7.7)
Neutrophils Relative %: 54.6 % (ref 43.0–77.0)
Platelets: 221 10*3/uL (ref 150.0–400.0)
RBC: 4.33 Mil/uL (ref 4.22–5.81)
RDW: 14.9 % (ref 11.5–15.5)
WBC: 5.4 10*3/uL (ref 4.0–10.5)

## 2017-04-22 LAB — LIPID PANEL
CHOL/HDL RATIO: 3
Cholesterol: 166 mg/dL (ref 0–200)
HDL: 60.4 mg/dL (ref >39.00)
LDL CALC: 94 mg/dL (ref 0–99)
NONHDL: 105.78
Triglycerides: 61 mg/dL (ref 0.0–149.0)
VLDL: 12.2 mg/dL (ref 0.0–40.0)

## 2017-04-22 LAB — PSA, MEDICARE: PSA: 0.95 ng/ml (ref 0.10–4.00)

## 2017-04-22 LAB — HEMOGLOBIN A1C: HEMOGLOBIN A1C: 6.1 % (ref 4.6–6.5)

## 2017-04-27 ENCOUNTER — Ambulatory Visit (INDEPENDENT_AMBULATORY_CARE_PROVIDER_SITE_OTHER): Payer: Medicare Other | Admitting: Family Medicine

## 2017-04-27 ENCOUNTER — Encounter: Payer: Self-pay | Admitting: Family Medicine

## 2017-04-27 ENCOUNTER — Telehealth: Payer: Self-pay

## 2017-04-27 VITALS — BP 140/80 | HR 84 | Temp 98.3°F | Ht 68.5 in | Wt 191.8 lb

## 2017-04-27 DIAGNOSIS — D500 Iron deficiency anemia secondary to blood loss (chronic): Secondary | ICD-10-CM

## 2017-04-27 DIAGNOSIS — Z0000 Encounter for general adult medical examination without abnormal findings: Secondary | ICD-10-CM | POA: Diagnosis not present

## 2017-04-27 MED ORDER — ESZOPICLONE 2 MG PO TABS: 2.0000 mg | ORAL_TABLET | Freq: Every evening | ORAL | 1 refills | Status: DC | PRN

## 2017-04-27 MED ORDER — ATORVASTATIN CALCIUM 10 MG PO TABS: 10.0000 mg | ORAL_TABLET | Freq: Every day | ORAL | 3 refills | Status: DC

## 2017-04-27 MED ORDER — VALSARTAN-HYDROCHLOROTHIAZIDE 320-12.5 MG PO TABS: 1.0000 | ORAL_TABLET | Freq: Every day | ORAL | 3 refills | Status: DC

## 2017-04-27 MED ORDER — BUPROPION HCL ER (XL) 150 MG PO TB24: 150.0000 mg | ORAL_TABLET | Freq: Every day | ORAL | 3 refills | Status: DC

## 2017-04-27 NOTE — Progress Notes (Signed)
Dr. Frederico Hamman T. Jil Penland, MD, [facility] and Sports Medicine [place], [address] Phone: [phone] Fax: [phone]  04/27/2017  Patient: Devin Garza, MRN: [id number], DOB: Oct 26, 1941, 76 y.o.  Primary Physician:  Owens Loffler, MD   Chief Complaint  Patient presents with  . Medicare Wellness   Subjective:   Devin Garza is a 76 y.o. pleasant patient who presents for a medicare wellness examination:  Preventative Health Maintenance Visit:  Health Maintenance Summary Reviewed and updated, unless pt declines services.  Tobacco History Reviewed. Alcohol: No concerns, decrease to 1 glass of wine a day Exercise Habits: recent increase activity, rec at least 30 mins 5 times a week STD concerns: no risk or activity to increase risk Drug Use: None Encouraged self-testicular check  Feeling much better - hgb level is up  Health Maintenance  Topic Date Due  . TETANUS/TDAP  12/27/2013  . INFLUENZA VACCINE  07/27/2017  . PNA vac Low Risk Adult  Completed    Immunization History  Administered Date(s) Administered  . Influenza Split 09/10/2012, 09/24/2013  . Influenza, High Dose Seasonal PF 09/26/2016  . Pneumococcal Conjugate-13 04/23/2015  . Pneumococcal Polysaccharide-23 01/21/2011  . Td 12/28/2003  . Zoster 10/29/2013    Patient Active Problem List   Diagnosis Date Noted  . Gout 03/21/2017  . GI bleed 10/27/2016  . Angiodysplasia of stomach and duodenum without hemorrhage   . Polyp of stomach and duodenum   . Benign neoplasm of transverse colon   . Benign neoplasm of descending colon   . Esophageal reflux 05/28/2015  . Tubular adenoma of colon 05/28/2015  . Prediabetes 04/21/2011  . ALCOHOL ABUSE 01/21/2011  . TRANSAMINASES, SERUM, ELEVATED 01/21/2011  . Anemia 11/12/2009  . CARCINOMA, BASAL CELL 09/03/2008  . CAROTID ARTERY STENOSIS, BILATERAL 08/28/2008  . INSOMNIA UNSPECIFIED 08/28/2008  . Hyperlipidemia  08/27/2008  . Essential hypertension 08/27/2008   Past Medical History:  Diagnosis Date  . Anemia   . Basal cell carcinoma of lip 2007  . ED (erectile dysfunction)   . HLD (hyperlipidemia)   . Hypertension   . Osteoarthritis   . Tobacco abuse   . Tubular adenoma of colon 2005  . Wears dentures    full upper and lower   Past Surgical History:  Procedure Laterality Date  . ANKLE ARTHROSCOPY W/ OPEN REPAIR    . COLONOSCOPY  2006   [place]-Tubular adenoma  . COLONOSCOPY N/A 07/23/2015   Procedure: COLONOSCOPY;  Surgeon: Lucilla Lame, MD;  Location: [facility];  Service: Gastroenterology;  Laterality: N/A;  WITH BIOPSIES---- TRANSVERSE COLON POLYP DESCENDING COLON POLYP  X  4  . ESOPHAGOGASTRODUODENOSCOPY N/A 07/23/2015   Procedure: ESOPHAGOGASTRODUODENOSCOPY (EGD);  Surgeon: Lucilla Lame, MD;  Location: [facility];  Service: Gastroenterology;  Laterality: N/A;  WITH BIOPSY--- DUODENAL BIOPSY GASTRIC BIOPSY  . ESOPHAGOGASTRODUODENOSCOPY N/A 10/28/2016   Procedure: ESOPHAGOGASTRODUODENOSCOPY (EGD);  Surgeon: Manya Silvas, MD;  [facility] END[facility]COPY;  Service: Endoscopy;  Laterality: N/A;  . SKIN CANCER EXCISION     LIP  . UPPER GI ENDOSCOPY  12/2009   Dr. Ivory Broad gastritisl negative for H. pylori; duodenal biopsies neg for sprue   Social History   Social History  . Marital status: Single    Spouse name: N/A  . Number of children: 2  . Years of education: N/A   Occupational History  . retired [employer] Retired   Social History Main Topics  . Smoking status: Current Every  Day Smoker    Packs/day: 0.50    Years: 40.00    Types: Cigarettes  . Smokeless tobacco: Never Used     Comment: cigars occassionally  . Alcohol use 9.6 oz/week    14 Shots of liquor, 2 Standard drinks or equivalent per week     Comment: 2 drinks whiskey daily  . Drug use: No  . Sexual activity: Not on file   Other Topics Concern  . Not on file   Social History  Narrative   Lives alone   Family History  Problem Relation Age of Onset  . Cancer Mother 16    LUNG  . Cancer Father     THROAT  . Hearing loss Father     HEART ATTACK, CAD  . Cancer Sister     OVARIAN   No Known Allergies  Medication list has been reviewed and updated.   General: Denies fever, chills, sweats. No significant weight loss. Eyes: Denies blurring,significant itching ENT: Denies earache, sore throat, and hoarseness. Cardiovascular: Denies chest pains, palpitations, dyspnea on exertion Respiratory: Denies cough, dyspnea at rest,wheeezing Breast: no concerns about lumps GI: Denies nausea, vomiting, diarrhea, constipation, change in bowel habits, abdominal pain, melena, hematochezia GU: Denies penile discharge, ED, urinary flow / outflow problems. No STD concerns. Musculoskeletal: Denies back pain, joint pain Derm: Denies rash, itching Neuro: Denies  paresthesias, frequent falls, frequent headaches Psych: Denies depression, anxiety Endocrine: Denies cold intolerance, heat intolerance, polydipsia Heme: Denies enlarged lymph nodes Allergy: No hayfever  Objective:   BP 140/80   Pulse 84   Temp 98.3 F (36.8 C) (Oral)   Ht 5' 8.5" (1.74 m)   Wt 191 lb 12 oz (87 kg)   BMI 28.73 kg/m   The patient completed a fall screen and PHQ-2 and PHQ-9 if necessary, which is documented in the EHR. The CMA/LPN/RN who assisted the patient verbally completed with them and documented results in [place].   Hearing Screening   Method: Audiometry   125Hz  250Hz  500Hz  1000Hz  2000Hz  3000Hz  4000Hz  6000Hz  8000Hz   Right ear:           Left ear:           Vision Screening Comments: Wears Glasses.  Eye Exam at [facility] 2017  GEN: well developed, well nourished, no acute distress Eyes: conjunctiva and lids normal, PERRLA, EOMI ENT: TM clear, nares clear, oral exam WNL Neck: supple, no lymphadenopathy, no thyromegaly, no JVD Pulm: clear to auscultation and  percussion, respiratory effort normal CV: regular rate and rhythm, S1-S2, no murmur, rub or gallop, no bruits, peripheral pulses normal and symmetric, no cyanosis, clubbing, edema or varicosities GI: soft, non-tender; no hepatosplenomegaly, masses; active bowel sounds all quadrants GU: no hernia, testicular mass, penile discharge Lymph: no cervical, axillary or inguinal adenopathy MSK: gait normal, muscle tone and strength WNL, no joint swelling, effusions, discoloration, crepitus  SKIN: clear, good turgor, color WNL, no rashes, lesions, or ulcerations Neuro: normal mental status, normal strength, sensation, and motion Psych: alert; oriented to person, place and time, normally interactive and not anxious or depressed in appearance.  All labs reviewed with patient.  Lipids:    Component Value Date/Time   CHOL 166 04/22/2017 1215   TRIG 61.0 04/22/2017 1215   HDL 60.40 04/22/2017 1215   LDLDIRECT 132.0 01/18/2011 0914   VLDL 12.2 04/22/2017 1215   CHOLHDL 3 04/22/2017 1215   CBC: CBC Latest Ref Rng & Units 04/22/2017 03/18/2017 11/04/2016  WBC 4.0 -  10.5 K/uL 5.4 5.9 5.1  Hemoglobin 13.0 - 17.0 g/dL 13.3 12.7(L) 10.1(L)  Hematocrit 39.0 - 52.0 % 38.8(L) 37.8(L) 30.9(L)  Platelets 150.0 - 400.0 K/uL 221.0 232.0 324.4    Basic Metabolic Panel:    Component Value Date/Time   NA 138 04/22/2017 1215   K 4.4 04/22/2017 1215   CL 104 04/22/2017 1215   CO2 26 04/22/2017 1215   BUN 29 (H) 04/22/2017 1215   CREATININE 1.23 04/22/2017 1215   CREATININE 1.37 (H) 10/25/2016 1124   GLUCOSE 139 (H) 04/22/2017 1215   CALCIUM 9.3 04/22/2017 1215   Hepatic Function Latest Ref Rng & Units 04/22/2017 10/27/2016 10/25/2016  Total Protein 6.0 - 8.3 g/dL 6.7 7.8 7.5  Albumin 3.5 - 5.2 g/dL 4.2 4.4 4.5  AST 0 - 37 U/L 30 40 29  ALT 0 - 53 U/L 31 27 21   Alk Phosphatase 39 - 117 U/L 52 58 57  Total Bilirubin 0.2 - 1.2 mg/dL 0.5 0.6 0.4  Bilirubin, Direct 0.0 - 0.3 mg/dL 0.1 - 0.1    Lab Results    Component Value Date   TSH 1.35 01/18/2011   Lab Results  Component Value Date   PSA 0.95 04/22/2017   PSA 1.06 04/19/2016   PSA 1.35 04/18/2015    Assessment and Plan:   Healthcare maintenance  Iron deficiency anemia due to chronic blood loss - Plan: CBC with Differential/Platelet  Health Maintenance Exam: The patient's preventative maintenance and recommended screening tests for an annual wellness exam were reviewed in full today. Brought up to date unless services declined.  Counselled on the importance of diet, exercise, and its role in overall health and mortality. The patient's FH and SH was reviewed, including their home life, tobacco status, and drug and alcohol status.  Follow-up in 1 year for physical exam or additional follow-up below.  I have personally reviewed the Medicare Annual Wellness questionnaire and have noted 1. The patient's medical and social history 2. Their use of alcohol, tobacco or illicit drugs 3. Their current medications and supplements 4. The patient's functional ability including ADL's, fall risks, home safety risks and hearing or visual             impairment. 5. Diet and physical activities 6. Evidence for depression or mood disorders 7. Reviewed Updated provider list, see scanned forms and [facility] Snapshot.   The patients weight, height, BMI and visual acuity have been recorded in the chart I have made referrals, counseling and provided education to the patient based review of the above and I have provided the pt with a written personalized care plan for preventive services.  I have provided the patient with a copy of your personalized plan for preventive services. Instructed to take the time to review along with their updated medication list.  Follow-up: No Follow-up on file. Or follow-up in 1 year if not noted.  Meds ordered this encounter  Medications  . esomeprazole (NEXIUM) 40 MG capsule    Sig: Take by mouth.  . loratadine  (CLARITIN) 10 MG tablet    Sig: Take 10 mg by mouth daily as needed for allergies.  Marland Kitchen atorvastatin (LIPITOR) 10 MG tablet    Sig: Take 1 tablet (10 mg total) by mouth daily.    Dispense:  90 tablet    Refill:  3  . buPROPion (WELLBUTRIN XL) 150 MG 24 hr tablet    Sig: Take 1 tablet (150 mg total) by mouth daily.    Dispense:  90  tablet    Refill:  3  . valsartan-hydrochlorothiazide (DIOVAN HCT) 320-12.5 MG tablet    Sig: Take 1 tablet by mouth daily.    Dispense:  90 tablet    Refill:  3  . eszopiclone (LUNESTA) 2 MG TABS tablet    Sig: Take 1 tablet (2 mg total) by mouth at bedtime as needed for sleep. Take immediately before bedtime    Dispense:  90 tablet    Refill:  1   Medications Discontinued During This Encounter  Medication Reason  . esomeprazole (NEXIUM) 20 MG capsule Change in therapy  . atorvastatin (LIPITOR) 10 MG tablet Reorder  . buPROPion (WELLBUTRIN XL) 150 MG 24 hr tablet Reorder  . valsartan-hydrochlorothiazide (DIOVAN HCT) 320-12.5 MG tablet Reorder  . eszopiclone (LUNESTA) 2 MG TABS tablet Reorder   Orders Placed This Encounter  Procedures  . CBC with Differential/Platelet    Signed,  Frederico Hamman T. Farouk Vivero, MD   Allergies as of 04/27/2017   No Known Allergies     Medication List       Accurate as of 04/27/17  1:19 PM. Always use your most recent med list.          atorvastatin 10 MG tablet Commonly known as:  LIPITOR Take 1 tablet (10 mg total) by mouth daily.   buPROPion 150 MG 24 hr tablet Commonly known as:  WELLBUTRIN XL Take 1 tablet (150 mg total) by mouth daily.   Colchicine 0.6 MG Caps Commonly known as:  MITIGARE Take 1 tablet by mouth 2 (two) times daily as needed.   Diclofenac Sodium 1.5 % Soln Apply to affected joints QID as needed   esomeprazole 40 MG capsule Commonly known as:  NEXIUM Take by mouth.   eszopiclone 2 MG Tabs tablet Commonly known as:  LUNESTA Take 1 tablet (2 mg total) by mouth at bedtime as needed for  sleep. Take immediately before bedtime   ferrous sulfate 325 (65 FE) MG tablet Take 325 mg by mouth daily with breakfast.   loratadine 10 MG tablet Commonly known as:  CLARITIN Take 10 mg by mouth daily as needed for allergies.   multivitamin tablet Take 1 tablet by mouth daily.   valsartan-hydrochlorothiazide 320-12.5 MG tablet Commonly known as:  DIOVAN HCT Take 1 tablet by mouth daily.

## 2017-04-27 NOTE — Telephone Encounter (Signed)
Pt wanted to know which med did not get sent to [facility] from earlier appt today. Spoke with Butch Penny CMA and [name] was faxed due to control level to [facility]. Pt appreciative and voiced understanding.

## 2017-04-27 NOTE — Progress Notes (Signed)
Pre visit review using our clinic review tool, if applicable. No additional management support is needed unless otherwise documented below in the visit note. 

## 2017-05-12 DIAGNOSIS — C44212 Basal cell carcinoma of skin of right ear and external auricular canal: Secondary | ICD-10-CM | POA: Diagnosis not present

## 2017-05-13 NOTE — Telephone Encounter (Signed)
Fu scheduled 5/30 at 1

## 2017-05-19 ENCOUNTER — Other Ambulatory Visit: Payer: Self-pay | Admitting: Family Medicine

## 2017-05-19 DIAGNOSIS — I6523 Occlusion and stenosis of bilateral carotid arteries: Secondary | ICD-10-CM

## 2017-05-25 ENCOUNTER — Ambulatory Visit: Payer: Medicare Other

## 2017-05-25 DIAGNOSIS — I6523 Occlusion and stenosis of bilateral carotid arteries: Secondary | ICD-10-CM

## 2017-05-25 LAB — VAS US CAROTID
LCCADSYS: -82 cm/s
LCCAPDIAS: 25 cm/s
LCCAPSYS: 137 cm/s
LEFT ECA DIAS: -51 cm/s
LEFT VERTEBRAL DIAS: -15 cm/s
LICAPDIAS: 14 cm/s
Left CCA dist dias: -20 cm/s
Left ICA dist dias: -42 cm/s
Left ICA dist sys: -147 cm/s
Left ICA prox sys: 61 cm/s
RCCAPDIAS: 10 cm/s
RCCAPSYS: 104 cm/s
RIGHT ECA DIAS: 26 cm/s
RIGHT VERTEBRAL DIAS: -13 cm/s
Right cca dist sys: -127 cm/s

## 2017-05-31 ENCOUNTER — Ambulatory Visit: Payer: Medicare Other | Admitting: Internal Medicine

## 2017-06-06 ENCOUNTER — Ambulatory Visit: Payer: Medicare Other | Admitting: Podiatry

## 2017-06-20 ENCOUNTER — Encounter: Payer: Self-pay | Admitting: Podiatry

## 2017-06-20 ENCOUNTER — Ambulatory Visit (INDEPENDENT_AMBULATORY_CARE_PROVIDER_SITE_OTHER): Payer: Medicare Other | Admitting: Podiatry

## 2017-06-20 DIAGNOSIS — M79609 Pain in unspecified limb: Secondary | ICD-10-CM

## 2017-06-20 DIAGNOSIS — B351 Tinea unguium: Secondary | ICD-10-CM | POA: Diagnosis not present

## 2017-06-20 NOTE — Progress Notes (Signed)
Complaint:  Visit Type: Patient returns to my office for continued preventative foot care services. Complaint: Patient states" my nails have grown long and thick and become painful to walk and wear shoes"  The patient presents for preventative foot care services. No changes to ROS  Podiatric Exam: Vascular: dorsalis pedis and posterior tibial pulses are palpable bilateral. Capillary return is immediate. Temperature gradient is WNL. Skin turgor WNL  Sensorium: Normal Semmes Weinstein monofilament test. Normal tactile sensation bilaterally. Nail Exam: Pt has thick disfigured discolored nails with subungual debris noted bilateral entire nail hallux through fifth toenails Ulcer Exam: There is no evidence of ulcer or pre-ulcerative changes or infection. Orthopedic Exam: Muscle tone and strength are WNL. No limitations in general ROM. No crepitus or effusions noted. HAV  B/L.  Hammer toe second right Skin: No Porokeratosis. No infection or ulcers  Diagnosis:  Onychomycosis, , Pain in right toe, pain in left toes  Treatment & Plan Procedures and Treatment: Consent by patient was obtained for treatment procedures. The patient understood the discussion of treatment and procedures well. All questions were answered thoroughly reviewed. Debridement of mycotic and hypertrophic toenails, 1 through 5 bilateral and clearing of subungual debris. No ulceration, no infection noted.  Return Visit-Office Procedure: Patient instructed to return to the office for a follow up visit 3 months for continued evaluation and treatment.    Tyronza Happe DPM 

## 2017-08-17 ENCOUNTER — Encounter (HOSPITAL_COMMUNITY): Payer: Self-pay | Admitting: Emergency Medicine

## 2017-08-17 ENCOUNTER — Ambulatory Visit (HOSPITAL_COMMUNITY)
Admission: EM | Admit: 2017-08-17 | Discharge: 2017-08-17 | Disposition: A | Discharge status: Home / Self Care | Payer: Medicare Other | Attending: Family Medicine | Admitting: Family Medicine

## 2017-08-17 DIAGNOSIS — S61012A Laceration without foreign body of left thumb without damage to nail, initial encounter: Secondary | ICD-10-CM

## 2017-08-17 DIAGNOSIS — Z23 Encounter for immunization: Secondary | ICD-10-CM

## 2017-08-17 DIAGNOSIS — W260XXA Contact with knife, initial encounter: Secondary | ICD-10-CM | POA: Diagnosis not present

## 2017-08-17 MED ORDER — TETANUS-DIPHTH-ACELL PERTUSSIS 5-2.5-18.5 LF-MCG/0.5 IM SUSP
INTRAMUSCULAR | Status: AC
  Filled 2017-08-17: qty 0.5

## 2017-08-17 MED ORDER — BACITRACIN ZINC 500 UNIT/GM EX OINT: 1.0000 "application " | TOPICAL_OINTMENT | Freq: Two times a day (BID) | CUTANEOUS | 0 refills | Status: DC

## 2017-08-17 MED ORDER — TETANUS-DIPHTH-ACELL PERTUSSIS 5-2.5-18.5 LF-MCG/0.5 IM SUSP
0.5000 mL | Freq: Once | INTRAMUSCULAR | Status: AC
  Administered 2017-08-17: 0.5 mL via INTRAMUSCULAR

## 2017-08-17 MED ORDER — DOXYCYCLINE HYCLATE 100 MG PO CAPS: 100.0000 mg | ORAL_CAPSULE | Freq: Two times a day (BID) | ORAL | 0 refills | Status: DC

## 2017-08-17 NOTE — ED Triage Notes (Addendum)
PT cut left thumb with a knife 9 days ago. PT does not know when last tetanus shot was.

## 2017-08-17 NOTE — ED Provider Notes (Signed)
Devin Garza   [id number] 08/17/17 Arrival Time: 4098  ASSESSMENT & PLAN:  1. Laceration of left thumb without foreign body without damage to nail, initial encounter     Meds ordered this encounter  Medications  . doxycycline (VIBRAMYCIN) 100 MG capsule    Sig: Take 1 capsule (100 mg total) by mouth 2 (two) times daily.    Dispense:  20 capsule    Refill:  0    Order Specific Question:   Supervising Provider    Answer:   Vanessa Kick [id number]  . bacitracin ointment    Sig: Apply 1 application topically 2 (two) times daily.    Dispense:  14 g    Refill:  0    Order Specific Question:   Supervising Provider    Answer:   Vanessa Kick [id number]  . Tdap (BOOSTRIX) injection 0.5 mL    Reviewed expectations re: course of current medical issues. Questions answered. Outlined signs and symptoms indicating need for more acute intervention. Patient verbalized understanding. After Visit Summary given.   SUBJECTIVE:  Devin Garza is a 76 y.o. male who presents with complaint of left thumb laceration that occurred a week ago and he has been pouring alcohol on the thumb daily.  The thumb is getting more painful.  ROS: As per HPI.   OBJECTIVE:  Vitals:   08/17/17 1028 08/17/17 1029  BP:  (!) 169/81  Pulse:  (!) 105  Resp:  16  Temp:  98.8 F (37.1 C)  TempSrc:  Oral  SpO2:  96%  Weight: 190 lb (86.2 kg)      General appearance: alert; no distress Eyes: PERRLA; EOMI; conjunctiva normal HENT: normocephalic; atraumatic;Lungs: clear to auscultation bilaterally Extremities: no cyanosis or edema; symmetrical with no gross deformities Skin: laceration left thumb tip and skin is macerated and white and tender. Neurologic: normal gait; normal symmetric reflexes Psychological: alert and cooperative; normal mood and affect  Past Medical History:  Diagnosis Date  . Anemia   . Basal cell carcinoma of lip 2007  . ED (erectile dysfunction)   . HLD (hyperlipidemia)     . Hypertension   . Osteoarthritis   . Tobacco abuse   . Tubular adenoma of colon 2005  . Wears dentures    full upper and lower     has a past medical history of Anemia; Basal cell carcinoma of lip (2007); ED (erectile dysfunction); HLD (hyperlipidemia); Hypertension; Osteoarthritis; Tobacco abuse; Tubular adenoma of colon (2005); and Wears dentures.  Results for orders placed or performed in visit on 05/25/17  VAS US CAROTID  Result Value Ref Range   Right CCA prox sys 104 cm/s   Right CCA prox dias 10 cm/s   Right cca dist sys -127 cm/s   Left CCA prox sys 137 cm/s   Left CCA prox dias 25 cm/s   Left CCA dist sys -82 cm/s   Left CCA dist dias -20 cm/s   Left ICA prox sys 61 cm/s   Left ICA prox dias 14 cm/s   Left ICA dist sys -147 cm/s   Left ICA dist dias -42 cm/s   RIGHT ECA DIAS 26.00 cm/s   RIGHT VERTEBRAL DIAS -13.00 cm/s   LEFT ECA DIAS -51.00 cm/s   LEFT VERTEBRAL DIAS -15.00 cm/s    Labs Reviewed - No data to display  Imaging: No results found.  No Known Allergies  Family History  Problem Relation Age of Onset  . Cancer Mother 35  LUNG  . Cancer Father        THROAT  . Hearing loss Father        HEART ATTACK, CAD  . Cancer Sister        OVARIAN   Past Surgical History:  Procedure Laterality Date  . ANKLE ARTHROSCOPY W/ OPEN REPAIR    . COLONOSCOPY  2006   [place]-Tubular adenoma  . COLONOSCOPY N/A 07/23/2015   Procedure: COLONOSCOPY;  Surgeon: Lucilla Lame, MD;  Location: [facility];  Service: Gastroenterology;  Laterality: N/A;  WITH BIOPSIES---- TRANSVERSE COLON POLYP DESCENDING COLON POLYP  X  4  . ESOPHAGOGASTRODUODENOSCOPY N/A 07/23/2015   Procedure: ESOPHAGOGASTRODUODENOSCOPY (EGD);  Surgeon: Lucilla Lame, MD;  Location: [facility];  Service: Gastroenterology;  Laterality: N/A;  WITH BIOPSY--- DUODENAL BIOPSY GASTRIC BIOPSY  . ESOPHAGOGASTRODUODENOSCOPY N/A 10/28/2016   Procedure: ESOPHAGOGASTRODUODENOSCOPY (EGD);   Surgeon: Manya Silvas, MD;  [facility] ENDOSCOPY;  Service: Endoscopy;  Laterality: N/A;  . SKIN CANCER EXCISION     LIP  . UPPER GI ENDOSCOPY  12/2009   Dr. Ivory Broad gastritisl negative for H. pylori; duodenal biopsies neg for sprue         Lysbeth Penner, FNP 08/17/17 1144

## 2017-08-22 ENCOUNTER — Encounter: Payer: Self-pay | Admitting: Family Medicine

## 2017-08-22 ENCOUNTER — Ambulatory Visit (INDEPENDENT_AMBULATORY_CARE_PROVIDER_SITE_OTHER): Payer: Medicare Other | Admitting: Family Medicine

## 2017-08-22 VITALS — BP 120/70 | HR 92 | Temp 98.9°F | Ht 68.5 in | Wt 190.2 lb

## 2017-08-22 DIAGNOSIS — S61012D Laceration without foreign body of left thumb without damage to nail, subsequent encounter: Secondary | ICD-10-CM | POA: Diagnosis not present

## 2017-08-22 DIAGNOSIS — I6523 Occlusion and stenosis of bilateral carotid arteries: Secondary | ICD-10-CM | POA: Diagnosis not present

## 2017-08-22 NOTE — Progress Notes (Signed)
Dr. Frederico Hamman T. Nashla Althoff, MD, [facility] and Sports Medicine [place], [address] Phone: [phone] Fax: [phone]  08/22/2017  Patient: Devin Garza, MRN: [id number], DOB: Jun 18, 1941, 76 y.o.  Primary Physician:  Owens Loffler, MD   Chief Complaint  Patient presents with  . Follow-up    [facility]-Left Thumb Laceration   Subjective:   Devin Garza is a 76 y.o. very pleasant male patient who presents with the following:  S/p L thumb lac s/p ER visit on 8/22. Rec doxy and Td  No stitches, now approaching 3 weeks out  Past Medical History, Surgical History, Social History, Family History, Problem List, Medications, and Allergies have been reviewed and updated if relevant.  Patient Active Problem List   Diagnosis Date Noted  . Gout 03/21/2017  . GI bleed 10/27/2016  . Angiodysplasia of stomach and duodenum without hemorrhage   . Polyp of stomach and duodenum   . Benign neoplasm of transverse colon   . Benign neoplasm of descending colon   . Esophageal reflux 05/28/2015  . Tubular adenoma of colon 05/28/2015  . Prediabetes 04/21/2011  . ALCOHOL ABUSE 01/21/2011  . TRANSAMINASES, SERUM, ELEVATED 01/21/2011  . Anemia 11/12/2009  . CARCINOMA, BASAL CELL 09/03/2008  . CAROTID ARTERY STENOSIS, BILATERAL 08/28/2008  . INSOMNIA UNSPECIFIED 08/28/2008  . Hyperlipidemia 08/27/2008  . Essential hypertension 08/27/2008    Past Medical History:  Diagnosis Date  . Anemia   . Basal cell carcinoma of lip 2007  . ED (erectile dysfunction)   . HLD (hyperlipidemia)   . Hypertension   . Osteoarthritis   . Tobacco abuse   . Tubular adenoma of colon 2005  . Wears dentures    full upper and lower    Past Surgical History:  Procedure Laterality Date  . ANKLE ARTHROSCOPY W/ OPEN REPAIR    . COLONOSCOPY  2006   [place]-Tubular adenoma  . COLONOSCOPY N/A 07/23/2015   Procedure: COLONOSCOPY;  Surgeon: Lucilla Lame, MD;  Location: [facility];  Service: Gastroenterology;  Laterality: N/A;  WITH BIOPSIES---- TRANSVERSE COLON POLYP DESCENDING COLON POLYP  X  4  . ESOPHAGOGASTRODUODENOSCOPY N/A 07/23/2015   Procedure: ESOPHAGOGASTRODUODENOSCOPY (EGD);  Surgeon: Lucilla Lame, MD;  Location: [facility];  Service: Gastroenterology;  Laterality: N/A;  WITH BIOPSY--- DUODENAL BIOPSY GASTRIC BIOPSY  . ESOPHAGOGASTRODUODENOSCOPY N/A 10/28/2016   Procedure: ESOPHAGOGASTRODUODENOSCOPY (EGD);  Surgeon: Manya Silvas, MD;  Location: [facility] END[facility]COPY;  Service: Endoscopy;  Laterality: N/A;  . SKIN CANCER EXCISION     LIP  . UPPER GI ENDOSCOPY  12/2009   Dr. Ivory Broad gastritisl negative for H. pylori; duodenal biopsies neg for sprue    Social History   Social History  . Marital status: Single    Spouse name: N/A  . Number of children: 2  . Years of education: N/A   Occupational History  . retired [employer] Retired   Social History Main Topics  . Smoking status: Current Every Day Smoker    Packs/day: 0.50    Years: 40.00    Types: Cigarettes  . Smokeless tobacco: Never Used     Comment: cigars occassionally  . Alcohol use 9.6 oz/week    14 Shots of liquor, 2 Standard drinks or equivalent per week     Comment: 2 drinks whiskey daily  . Drug use: No  . Sexual activity: Not on file   Other Topics Concern  . Not on file   Social History Narrative  Lives alone    Family History  Problem Relation Age of Onset  . Cancer Mother 88       LUNG  . Cancer Father        THROAT  . Hearing loss Father        HEART ATTACK, CAD  . Cancer Sister        OVARIAN    No Known Allergies  Medication list reviewed and updated in full in [place].   GEN: No acute illnesses, no fevers, chills. GI: No n/v/d, eating normally Pulm: No SOB Interactive and getting along well at home.  Otherwise, ROS is as per the HPI.  Objective:   BP 120/70   Pulse 92   Temp 98.9 F (37.2 C) (Oral)    Ht 5' 8.5" (1.74 m)   Wt 190 lb 4 oz (86.3 kg)   BMI 28.51 kg/m   GEN: WDWN, NAD, Non-toxic, A & O x 3 HEENT: Atraumatic, Normocephalic. Neck supple. No masses, No LAD. Ears and Nose: No external deformity. EXTR: No c/c/e NEURO Normal gait.  PSYCH: Normally interactive. Conversant. Not depressed or anxious appearing.  Calm demeanor.   L thumb small distal lac, no redness, minimal pain, ext and flexion intact at ip and mcp joint  Laboratory and Imaging Data:  Assessment and Plan:   Laceration of left thumb without foreign body without damage to nail, subsequent encounter  Looking good. No concerns.  Follow-up: No Follow-up on file.  Future Appointments Date Time Provider [place]  09/19/2017 8:45 AM Gardiner Barefoot, DPM [id number]   Signed,  Maud Deed. Burhan Barham, MD   Patient's Medications  New Prescriptions   No medications on file  Previous Medications   ATORVASTATIN (LIPITOR) 10 MG TABLET    Take 1 tablet (10 mg total) by mouth daily.   BACITRACIN OINTMENT    Apply 1 application topically 2 (two) times daily.   BUPROPION (WELLBUTRIN XL) 150 MG 24 HR TABLET    Take 1 tablet (150 mg total) by mouth daily.   COLCHICINE (MITIGARE) 0.6 MG CAPS    Take 1 tablet by mouth 2 (two) times daily as needed.   DICLOFENAC SODIUM 1.5 % SOLN    Apply to affected joints QID as needed   DOXYCYCLINE (VIBRAMYCIN) 100 MG CAPSULE    Take 1 capsule (100 mg total) by mouth 2 (two) times daily.   ESOMEPRAZOLE (NEXIUM) 40 MG CAPSULE    Take 40 mg by mouth 2 (two) times daily before a meal.    ESZOPICLONE (LUNESTA) 2 MG TABS TABLET    Take 1 tablet (2 mg total) by mouth at bedtime as needed for sleep. Take immediately before bedtime   FERROUS SULFATE 325 (65 FE) MG TABLET    Take 325 mg by mouth daily with breakfast.   LORATADINE (CLARITIN) 10 MG TABLET    Take 10 mg by mouth daily as needed for allergies.   MULTIPLE VITAMIN (MULTIVITAMIN) TABLET    Take 1 tablet by mouth daily.     VALSARTAN-HYDROCHLOROTHIAZIDE (DIOVAN HCT) 320-12.5 MG TABLET    Take 1 tablet by mouth daily.  Modified Medications   No medications on file  Discontinued Medications   No medications on file

## 2017-09-01 DIAGNOSIS — K921 Melena: Secondary | ICD-10-CM | POA: Diagnosis not present

## 2017-09-02 ENCOUNTER — Encounter: Payer: Self-pay | Admitting: *Deleted

## 2017-09-02 ENCOUNTER — Ambulatory Visit: Payer: Medicare Other | Admitting: Anesthesiology

## 2017-09-02 ENCOUNTER — Encounter
Admission: RE | Disposition: A | Discharge status: Home / Self Care | Payer: Self-pay | Source: Ambulatory Visit | Attending: Unknown Physician Specialty

## 2017-09-02 ENCOUNTER — Ambulatory Visit
Admission: RE | Admit: 2017-09-02 | Discharge: 2017-09-02 | Disposition: A | Discharge status: Home / Self Care | Payer: Medicare Other | Source: Ambulatory Visit | Attending: Unknown Physician Specialty | Admitting: Unknown Physician Specialty

## 2017-09-02 DIAGNOSIS — Z79899 Other long term (current) drug therapy: Secondary | ICD-10-CM | POA: Insufficient documentation

## 2017-09-02 DIAGNOSIS — F17210 Nicotine dependence, cigarettes, uncomplicated: Secondary | ICD-10-CM | POA: Insufficient documentation

## 2017-09-02 DIAGNOSIS — K31819 Angiodysplasia of stomach and duodenum without bleeding: Secondary | ICD-10-CM | POA: Diagnosis not present

## 2017-09-02 DIAGNOSIS — K921 Melena: Secondary | ICD-10-CM | POA: Diagnosis not present

## 2017-09-02 DIAGNOSIS — D649 Anemia, unspecified: Secondary | ICD-10-CM | POA: Diagnosis not present

## 2017-09-02 DIAGNOSIS — I739 Peripheral vascular disease, unspecified: Secondary | ICD-10-CM | POA: Insufficient documentation

## 2017-09-02 DIAGNOSIS — K222 Esophageal obstruction: Secondary | ICD-10-CM | POA: Insufficient documentation

## 2017-09-02 DIAGNOSIS — E785 Hyperlipidemia, unspecified: Secondary | ICD-10-CM | POA: Insufficient documentation

## 2017-09-02 DIAGNOSIS — Z85828 Personal history of other malignant neoplasm of skin: Secondary | ICD-10-CM | POA: Diagnosis not present

## 2017-09-02 DIAGNOSIS — M1990 Unspecified osteoarthritis, unspecified site: Secondary | ICD-10-CM | POA: Insufficient documentation

## 2017-09-02 DIAGNOSIS — K219 Gastro-esophageal reflux disease without esophagitis: Secondary | ICD-10-CM | POA: Insufficient documentation

## 2017-09-02 DIAGNOSIS — I10 Essential (primary) hypertension: Secondary | ICD-10-CM | POA: Insufficient documentation

## 2017-09-02 HISTORY — PX: ESOPHAGOGASTRODUODENOSCOPY (EGD) WITH PROPOFOL: SHX5813

## 2017-09-02 HISTORY — DX: Peripheral vascular disease, unspecified: I73.9

## 2017-09-02 HISTORY — DX: Gastro-esophageal reflux disease without esophagitis: K21.9

## 2017-09-02 SURGERY — ESOPHAGOGASTRODUODENOSCOPY (EGD) WITH PROPOFOL
Anesthesia: General

## 2017-09-02 MED ORDER — PROPOFOL 500 MG/50ML IV EMUL
INTRAVENOUS | Status: DC | PRN
  Administered 2017-09-02: 160 ug/kg/min via INTRAVENOUS

## 2017-09-02 MED ORDER — PROPOFOL 500 MG/50ML IV EMUL
INTRAVENOUS | Status: AC
  Filled 2017-09-02: qty 50

## 2017-09-02 MED ORDER — SODIUM CHLORIDE 0.9 % IV SOLN: INTRAVENOUS | Status: DC

## 2017-09-02 MED ORDER — IPRATROPIUM-ALBUTEROL 0.5-2.5 (3) MG/3ML IN SOLN
RESPIRATORY_TRACT | Status: AC
  Filled 2017-09-02: qty 3

## 2017-09-02 MED ORDER — LIDOCAINE HCL (CARDIAC) 20 MG/ML IV SOLN
INTRAVENOUS | Status: DC | PRN
  Administered 2017-09-02: 40 mg via INTRAVENOUS

## 2017-09-02 MED ORDER — LIDOCAINE HCL (PF) 2 % IJ SOLN
INTRAMUSCULAR | Status: AC
  Filled 2017-09-02: qty 2

## 2017-09-02 MED ORDER — SODIUM CHLORIDE 0.9 % IV SOLN
INTRAVENOUS | Status: DC
  Administered 2017-09-02: 13:00:00 via INTRAVENOUS

## 2017-09-02 MED ORDER — PROPOFOL 10 MG/ML IV BOLUS
INTRAVENOUS | Status: DC | PRN
  Administered 2017-09-02 (×2): 20 mg via INTRAVENOUS
  Administered 2017-09-02 (×2): 30 mg via INTRAVENOUS

## 2017-09-02 MED ORDER — IPRATROPIUM-ALBUTEROL 0.5-2.5 (3) MG/3ML IN SOLN
3.0000 mL | Freq: Once | RESPIRATORY_TRACT | Status: AC
  Administered 2017-09-02: 3 mL via RESPIRATORY_TRACT

## 2017-09-02 NOTE — H&P (Signed)
Primary Care Physician:  Owens Loffler, MD Primary Gastroenterologist:  Dr. Vira Agar  Pre-Procedure History & Physical: HPI:  Devin Garza is a 76 y.o. male is here for an endoscopy.   Past Medical History:  Diagnosis Date  . Anemia   . Basal cell carcinoma of lip 2007  . ED (erectile dysfunction)   . GERD (gastroesophageal reflux disease)   . HLD (hyperlipidemia)   . Hypertension   . Osteoarthritis   . Peripheral vascular disease ([place])    Occlusion and Stenosis of Carotid Artery  . Tobacco abuse   . Tubular adenoma of colon 2005  . Wears dentures    full upper and lower    Past Surgical History:  Procedure Laterality Date  . ANKLE ARTHROSCOPY W/ OPEN REPAIR    . COLONOSCOPY  2006   [place]-Tubular adenoma  . COLONOSCOPY N/A 07/23/2015   Procedure: COLONOSCOPY;  Surgeon: Lucilla Lame, MD;  Location: [place];  Service: Gastroenterology;  Laterality: N/A;  WITH BIOPSIES---- TRANSVERSE COLON POLYP DESCENDING COLON POLYP  X  4  . ESOPHAGOGASTRODUODENOSCOPY N/A 07/23/2015   Procedure: ESOPHAGOGASTRODUODENOSCOPY (EGD);  Surgeon: Lucilla Lame, MD;  Location: [facility];  Service: Gastroenterology;  Laterality: N/A;  WITH BIOPSY--- DUODENAL BIOPSY GASTRIC BIOPSY  . ESOPHAGOGASTRODUODENOSCOPY N/A 10/28/2016   Procedure: ESOPHAGOGASTRODUODENOSCOPY (EGD);  Surgeon: Manya Silvas, MD;  Location: [facility] ENDOSCOPY;  Service: Endoscopy;  Laterality: N/A;  . SKIN CANCER EXCISION     LIP  . UPPER GI ENDOSCOPY  12/2009   Dr. Ivory Broad gastritisl negative for H. pylori; duodenal biopsies neg for sprue    Prior to Admission medications   Medication Sig Start Date End Date Taking? Authorizing Provider  atorvastatin (LIPITOR) 10 MG tablet Take 1 tablet (10 mg total) by mouth daily. 04/27/17  Yes Copland, Frederico Hamman, MD  buPROPion (WELLBUTRIN XL) 150 MG 24 hr tablet Take 1 tablet (150 mg total) by mouth daily. 04/27/17  Yes Copland, Frederico Hamman, MD  Colchicine (MITIGARE) 0.6  MG CAPS Take 1 tablet by mouth 2 (two) times daily as needed. 03/21/17  Yes Copland, Frederico Hamman, MD  eszopiclone (LUNESTA) 2 MG TABS tablet Take 1 tablet (2 mg total) by mouth at bedtime as needed for sleep. Take immediately before bedtime 04/27/17  Yes Copland, Frederico Hamman, MD  ferrous sulfate 325 (65 FE) MG tablet Take 325 mg by mouth daily with breakfast.   Yes [provider]  loratadine (CLARITIN) 10 MG tablet Take 10 mg by mouth daily as needed for allergies.   Yes [provider]  Multiple Vitamin (MULTIVITAMIN) tablet Take 1 tablet by mouth daily.   Yes [provider]  valsartan-hydrochlorothiazide (DIOVAN HCT) 320-12.5 MG tablet Take 1 tablet by mouth daily. 04/27/17  Yes Copland, Frederico Hamman, MD    Allergies as of 09/01/2017  . (No Known Allergies)    Family History  Problem Relation Age of Onset  . Cancer Mother 10       LUNG  . Cancer Father        THROAT  . Hearing loss Father        HEART ATTACK, CAD  . Cancer Sister        OVARIAN    Social History   Social History  . Marital status: Single    Spouse name: N/A  . Number of children: 2  . Years of education: N/A   Occupational History  . retired [employer] Retired   Social History Main Topics  . Smoking status: Current Every Day  Smoker    Packs/day: 0.50    Years: 40.00    Types: Cigarettes  . Smokeless tobacco: Never Used     Comment: cigars occassionally  . Alcohol use 9.6 oz/week    14 Shots of liquor, 2 Standard drinks or equivalent per week     Comment: 2 drinks whiskey daily  . Drug use: No  . Sexual activity: Not on file   Other Topics Concern  . Not on file   Social History Narrative   Lives alone    Review of Systems: See HPI, otherwise negative ROS  Physical Exam: BP (!) 154/96   Pulse (!) 114   Temp (!) 97 F (36.1 C) (Tympanic)   Resp 20   Ht 5\' 9"  (1.753 m)   Wt 83.5 kg (184 lb)   SpO2 96%   BMI 27.17 kg/m  General:   Alert,  pleasant and cooperative in  NAD Head:  Normocephalic and atraumatic. Neck:  Supple; no masses or thyromegaly. Lungs:  Clear throughout to auscultation.    Heart:  Regular rate and rhythm. Abdomen:  Soft, nontender and nondistended. Normal bowel sounds, without guarding, and without rebound.   Neurologic:  Alert and  oriented x4;  grossly normal neurologically.  Impression/Plan: [name] is here for an endoscopy to be performed for GI bleeding possibly from stomach or duodenum  Risks, benefits, limitations, and alternatives regarding  endoscopy have been reviewed with the patient.  Questions have been answered.  All parties agreeable.   Gaylyn Cheers, MD  09/02/2017, 2:46 PM

## 2017-09-02 NOTE — Anesthesia Procedure Notes (Signed)
Date/Time: 09/02/2017 2:48 PM Performed by: Johnna Acosta Pre-anesthesia Checklist: Patient identified, Emergency Drugs available, Suction available, Patient being monitored and Timeout performed Patient Re-evaluated:Patient Re-evaluated prior to induction Oxygen Delivery Method: Nasal cannula

## 2017-09-02 NOTE — Anesthesia Postprocedure Evaluation (Signed)
Anesthesia Post Note  Patient: Devin Garza  Procedure(Garza) Performed: Procedure(Garza) (LRB): ESOPHAGOGASTRODUODENOSCOPY (EGD) WITH PROPOFOL (N/A)  Patient location during evaluation: Endoscopy Anesthesia Type: General Level of consciousness: awake and alert Pain management: pain level controlled Vital Signs Assessment: post-procedure vital signs reviewed and stable Respiratory status: spontaneous breathing, nonlabored ventilation, respiratory function stable and patient connected to nasal cannula oxygen Cardiovascular status: blood pressure returned to baseline and stable Postop Assessment: no signs of nausea or vomiting Anesthetic complications: no     Last Vitals:  Vitals:   09/02/17 1539 09/02/17 1542  BP: (!) 147/109   Pulse:  (!) 43  Resp:    Temp:    SpO2:      Last Pain:  Vitals:   09/02/17 1507  TempSrc: Tympanic                 Devin Garza

## 2017-09-02 NOTE — Anesthesia Preprocedure Evaluation (Addendum)
Anesthesia Evaluation  Patient identified by MRN, date of birth, ID band Patient awake    Reviewed: Allergy & Precautions, NPO status , Patient's Chart, lab work & pertinent test results, reviewed documented beta blocker date and time   History of Anesthesia Complications (+) POST - OP SPINAL HEADACHE  Airway Mallampati: II  TM Distance: >3 FB     Dental  (+) Chipped, Upper Dentures, Lower Dentures   Pulmonary Current Smoker,           Cardiovascular hypertension, Pt. on medications + Peripheral Vascular Disease       Neuro/Psych PSYCHIATRIC DISORDERS    GI/Hepatic GERD  ,  Endo/Other    Renal/GU      Musculoskeletal  (+) Arthritis ,   Abdominal   Peds  Hematology  (+) anemia ,   Anesthesia Other Findings Hx etoh.  Reproductive/Obstetrics                            Anesthesia Physical Anesthesia Plan  ASA: III  Anesthesia Plan: General   Post-op Pain Management:    Induction: Intravenous  PONV Risk Score and Plan:   Airway Management Planned: Oral ETT  Additional Equipment:   Intra-op Plan:   Post-operative Plan:   Informed Consent: I have reviewed the patients History and Physical, chart, labs and discussed the procedure including the risks, benefits and alternatives for the proposed anesthesia with the patient or authorized representative who has indicated his/her understanding and acceptance.     Plan Discussed with: CRNA  Anesthesia Plan Comments:         Anesthesia Quick Evaluation

## 2017-09-02 NOTE — Anesthesia Post-op Follow-up Note (Signed)
Anesthesia QCDR form completed.        

## 2017-09-02 NOTE — Op Note (Signed)
[facility] Gastroenterology Patient Name: Devin Garza Procedure Date: 09/02/2017 2:46 PM MRN: [id number] Account #: [id number] Date of Birth: 05/03/1941 Admit Type: Outpatient Age: 76 Room: [facility] ENDO ROOM 1 Gender: Male Note Status: Finalized Procedure:            Upper GI endoscopy Indications:          melena Providers:            Manya Silvas, MD Referring MD:         Maud Deed. Copland MD, MD (Referring MD) Medicines:            Propofol per Anesthesia Complications:        No immediate complications. Procedure:            Pre-Anesthesia Assessment:                       - After reviewing the risks and benefits, the patient                        was deemed in satisfactory condition to undergo the                        procedure.                       After obtaining informed consent, the endoscope was                        passed under direct vision. Throughout the procedure,                        the patient's blood pressure, pulse, and oxygen                        saturations were monitored continuously. The                        Colonoscope was introduced through the mouth, and                        advanced to the second part of duodenum. The upper GI                        endoscopy was accomplished without difficulty. The                        patient tolerated the procedure well. Findings:      A mild Schatzki ring (acquired) was found in the distal esophagus.      A few diminutive no bleeding angioectasias were found in the gastric       antrum. A clip was seen that was left over from a year ago seen in       greater curve. No blood seen anyhere in esophagus, stomach or duodenum.      The examined duodenum was normal. Impression:           - Mild Schatzki ring.                       - A few non-bleeding angioectasias in the stomach.                       -  No specimens collected. Recommendation:       - The findings and recommendations  were discussed with                        the patient's family. Manya Silvas, MD 09/02/2017 3:07:31 PM This report has been signed electronically. Number of Addenda: 0 Note Initiated On: 09/02/2017 2:46 PM      [facility]

## 2017-09-02 NOTE — Transfer of Care (Signed)
Immediate Anesthesia Transfer of Care Note  Patient: Devin Garza  Procedure(s) Performed: Procedure(s): ESOPHAGOGASTRODUODENOSCOPY (EGD) WITH PROPOFOL (N/A)  Patient Location: PACU  Anesthesia Type:General  Level of Consciousness: awake and alert   Airway & Oxygen Therapy: Patient Spontanous Breathing and Patient connected to nasal cannula oxygen  Post-op Assessment: Report given to RN and Post -op Vital signs reviewed and stable  Post vital signs: Reviewed and stable  Last Vitals:  Vitals:   09/02/17 1315 09/02/17 1507  BP: (!) 154/96 (!) 144/111  Pulse: (!) 114   Resp: 20   Temp: (!) 36.1 C (!) 35.7 C  SpO2: 96%     Last Pain:  Vitals:   09/02/17 1507  TempSrc: Tympanic         Complications: No apparent anesthesia complications

## 2017-09-05 ENCOUNTER — Encounter: Payer: Self-pay | Admitting: Unknown Physician Specialty

## 2017-09-19 ENCOUNTER — Ambulatory Visit (INDEPENDENT_AMBULATORY_CARE_PROVIDER_SITE_OTHER): Payer: Medicare Other | Admitting: Podiatry

## 2017-09-19 ENCOUNTER — Encounter: Payer: Self-pay | Admitting: Podiatry

## 2017-09-19 DIAGNOSIS — M79609 Pain in unspecified limb: Secondary | ICD-10-CM | POA: Diagnosis not present

## 2017-09-19 DIAGNOSIS — B351 Tinea unguium: Secondary | ICD-10-CM

## 2017-09-19 NOTE — Progress Notes (Signed)
Complaint:  Visit Type: Patient returns to my office for continued preventative foot care services. Complaint: Patient states" my nails have grown long and thick and become painful to walk and wear shoes"  The patient presents for preventative foot care services. No changes to ROS  Podiatric Exam: Vascular: dorsalis pedis and posterior tibial pulses are palpable bilateral. Capillary return is immediate. Temperature gradient is WNL. Skin turgor WNL  Sensorium: Normal Semmes Weinstein monofilament test. Normal tactile sensation bilaterally. Nail Exam: Pt has thick disfigured discolored nails with subungual debris noted bilateral entire nail hallux through fifth toenails Ulcer Exam: There is no evidence of ulcer or pre-ulcerative changes or infection. Orthopedic Exam: Muscle tone and strength are WNL. No limitations in general ROM. No crepitus or effusions noted. HAV  B/L.  Hammer toe second right Skin: No Porokeratosis. No infection or ulcers  Diagnosis:  Onychomycosis, , Pain in right toe, pain in left toes  Treatment & Plan Procedures and Treatment: Consent by patient was obtained for treatment procedures. The patient understood the discussion of treatment and procedures well. All questions were answered thoroughly reviewed. Debridement of mycotic and hypertrophic toenails, 1 through 5 bilateral and clearing of subungual debris. No ulceration, no infection noted.  Return Visit-Office Procedure: Patient instructed to return to the office for a follow up visit 3 months for continued evaluation and treatment.    Gardiner Barefoot DPM

## 2017-09-21 DIAGNOSIS — D1800 Hemangioma unspecified site: Secondary | ICD-10-CM | POA: Diagnosis not present

## 2017-09-21 DIAGNOSIS — L570 Actinic keratosis: Secondary | ICD-10-CM | POA: Diagnosis not present

## 2017-09-21 DIAGNOSIS — Z1283 Encounter for screening for malignant neoplasm of skin: Secondary | ICD-10-CM | POA: Diagnosis not present

## 2017-09-21 DIAGNOSIS — L812 Freckles: Secondary | ICD-10-CM | POA: Diagnosis not present

## 2017-09-21 DIAGNOSIS — L821 Other seborrheic keratosis: Secondary | ICD-10-CM | POA: Diagnosis not present

## 2017-09-21 DIAGNOSIS — L578 Other skin changes due to chronic exposure to nonionizing radiation: Secondary | ICD-10-CM | POA: Diagnosis not present

## 2017-09-21 DIAGNOSIS — Z85828 Personal history of other malignant neoplasm of skin: Secondary | ICD-10-CM | POA: Diagnosis not present

## 2017-10-19 DIAGNOSIS — Z23 Encounter for immunization: Secondary | ICD-10-CM | POA: Diagnosis not present

## 2017-12-12 ENCOUNTER — Ambulatory Visit (INDEPENDENT_AMBULATORY_CARE_PROVIDER_SITE_OTHER): Payer: Medicare Other | Admitting: Podiatry

## 2017-12-12 ENCOUNTER — Encounter: Payer: Self-pay | Admitting: Podiatry

## 2017-12-12 DIAGNOSIS — M79609 Pain in unspecified limb: Principal | ICD-10-CM

## 2017-12-12 DIAGNOSIS — B351 Tinea unguium: Secondary | ICD-10-CM | POA: Diagnosis not present

## 2017-12-12 DIAGNOSIS — M79676 Pain in unspecified toe(s): Secondary | ICD-10-CM | POA: Diagnosis not present

## 2017-12-12 NOTE — Progress Notes (Addendum)
Complaint:  Visit Type: Patient returns to my office for continued preventative foot care services. Complaint: Patient states" my nails have grown long and thick and become painful to walk and wear shoes"  The patient presents for preventative foot care services. No changes to ROS  Podiatric Exam: Vascular: dorsalis pedis and posterior tibial pulses are palpable bilateral. Capillary return is immediate. Temperature gradient is WNL. Skin turgor WNL  Sensorium: Normal Semmes Weinstein monofilament test. Normal tactile sensation bilaterally. Nail Exam: Pt has thick disfigured discolored nails with subungual debris noted bilateral entire nail hallux through fifth toenails Ulcer Exam: There is no evidence of ulcer or pre-ulcerative changes or infection. Orthopedic Exam: Muscle tone and strength are WNL. No limitations in general ROM. No crepitus or effusions noted. HAV  B/L.  Hammer toe second right Skin: No Porokeratosis. No infection or ulcers  Diagnosis:  Onychomycosis, , Pain in right toe, pain in left toes  Treatment & Plan Procedures and Treatment: Consent by patient was obtained for treatment procedures. The patient understood the discussion of treatment and procedures well. All questions were answered thoroughly reviewed. Debridement of mycotic and hypertrophic toenails, 1 through 5 bilateral and clearing of subungual debris. No ulceration, no infection noted. ABN signed for 2018. Return Visit-Office Procedure: Patient instructed to return to the office for a follow up visit 3 months for continued evaluation and treatment.    Gardiner Barefoot DPM

## 2017-12-19 ENCOUNTER — Ambulatory Visit: Payer: Medicare Other | Admitting: Podiatry

## 2017-12-23 DIAGNOSIS — Z9841 Cataract extraction status, right eye: Secondary | ICD-10-CM | POA: Diagnosis not present

## 2017-12-23 DIAGNOSIS — Z9842 Cataract extraction status, left eye: Secondary | ICD-10-CM | POA: Diagnosis not present

## 2018-03-13 ENCOUNTER — Encounter: Payer: Self-pay | Admitting: Podiatry

## 2018-03-13 ENCOUNTER — Ambulatory Visit (INDEPENDENT_AMBULATORY_CARE_PROVIDER_SITE_OTHER): Payer: Medicare Other | Admitting: Podiatry

## 2018-03-13 DIAGNOSIS — M79609 Pain in unspecified limb: Secondary | ICD-10-CM

## 2018-03-13 DIAGNOSIS — B351 Tinea unguium: Secondary | ICD-10-CM | POA: Diagnosis not present

## 2018-03-13 NOTE — Progress Notes (Signed)
Complaint:  Visit Type: Patient returns to my office for continued preventative foot care services. Complaint: Patient states" my nails have grown long and thick and become painful to walk and wear shoes"  The patient presents for preventative foot care services. No changes to ROS.  Podiatric Exam: Vascular: dorsalis pedis and posterior tibial pulses are palpable bilateral. Capillary return is immediate. Temperature gradient is WNL. Skin turgor WNL  Sensorium: Normal Semmes Weinstein monofilament test. Normal tactile sensation bilaterally. Nail Exam: Pt has thick disfigured discolored nails with subungual debris noted bilateral entire nail hallux through fifth toenails Ulcer Exam: There is no evidence of ulcer or pre-ulcerative changes or infection. Orthopedic Exam: Muscle tone and strength are WNL. No limitations in general ROM. No crepitus or effusions noted. HAV  B/L.  Hammer toe second right Skin: No Porokeratosis. No infection or ulcers  Diagnosis:  Onychomycosis, , Pain in right toe, pain in left toes  Treatment & Plan Procedures and Treatment: Consent by patient was obtained for treatment procedures. The patient understood the discussion of treatment and procedures well. All questions were answered thoroughly reviewed. Debridement of mycotic and hypertrophic toenails, 1 through 5 bilateral and clearing of subungual debris. No ulceration, no infection noted. ABN signed for 2019. Return Visit-Office Procedure: Patient instructed to return to the office for a follow up visit 10 weeks  for continued evaluation and treatment.    Gardiner Barefoot DPM

## 2018-03-20 DIAGNOSIS — L570 Actinic keratosis: Secondary | ICD-10-CM | POA: Diagnosis not present

## 2018-03-20 DIAGNOSIS — Z1283 Encounter for screening for malignant neoplasm of skin: Secondary | ICD-10-CM | POA: Diagnosis not present

## 2018-03-20 DIAGNOSIS — Z85828 Personal history of other malignant neoplasm of skin: Secondary | ICD-10-CM | POA: Diagnosis not present

## 2018-03-20 DIAGNOSIS — L821 Other seborrheic keratosis: Secondary | ICD-10-CM | POA: Diagnosis not present

## 2018-03-20 DIAGNOSIS — D485 Neoplasm of uncertain behavior of skin: Secondary | ICD-10-CM | POA: Diagnosis not present

## 2018-03-20 DIAGNOSIS — D692 Other nonthrombocytopenic purpura: Secondary | ICD-10-CM | POA: Diagnosis not present

## 2018-03-20 DIAGNOSIS — D1800 Hemangioma unspecified site: Secondary | ICD-10-CM | POA: Diagnosis not present

## 2018-03-20 DIAGNOSIS — L814 Other melanin hyperpigmentation: Secondary | ICD-10-CM | POA: Diagnosis not present

## 2018-03-20 DIAGNOSIS — D229 Melanocytic nevi, unspecified: Secondary | ICD-10-CM | POA: Diagnosis not present

## 2018-05-01 ENCOUNTER — Ambulatory Visit: Payer: Medicare Other

## 2018-05-01 ENCOUNTER — Other Ambulatory Visit: Payer: Medicare Other

## 2018-05-02 ENCOUNTER — Telehealth: Payer: Self-pay

## 2018-05-02 DIAGNOSIS — I10 Essential (primary) hypertension: Secondary | ICD-10-CM

## 2018-05-02 DIAGNOSIS — E782 Mixed hyperlipidemia: Secondary | ICD-10-CM

## 2018-05-02 DIAGNOSIS — M10072 Idiopathic gout, left ankle and foot: Secondary | ICD-10-CM

## 2018-05-02 DIAGNOSIS — R7303 Prediabetes: Secondary | ICD-10-CM

## 2018-05-02 DIAGNOSIS — Z125 Encounter for screening for malignant neoplasm of prostate: Secondary | ICD-10-CM

## 2018-05-02 NOTE — Telephone Encounter (Signed)
CPE labs being ordered and routed to PCP for approval.

## 2018-05-02 NOTE — Telephone Encounter (Signed)
ok 

## 2018-05-03 ENCOUNTER — Ambulatory Visit (INDEPENDENT_AMBULATORY_CARE_PROVIDER_SITE_OTHER): Payer: Medicare Other

## 2018-05-03 ENCOUNTER — Telehealth: Payer: Self-pay | Admitting: Family Medicine

## 2018-05-03 ENCOUNTER — Other Ambulatory Visit: Payer: Self-pay | Admitting: Family Medicine

## 2018-05-03 VITALS — BP 142/90 | HR 88 | Temp 98.0°F | Ht 68.25 in | Wt 191.8 lb

## 2018-05-03 DIAGNOSIS — M10072 Idiopathic gout, left ankle and foot: Secondary | ICD-10-CM | POA: Diagnosis not present

## 2018-05-03 DIAGNOSIS — D500 Iron deficiency anemia secondary to blood loss (chronic): Secondary | ICD-10-CM | POA: Diagnosis not present

## 2018-05-03 DIAGNOSIS — R7303 Prediabetes: Secondary | ICD-10-CM | POA: Diagnosis not present

## 2018-05-03 DIAGNOSIS — Z125 Encounter for screening for malignant neoplasm of prostate: Secondary | ICD-10-CM

## 2018-05-03 DIAGNOSIS — Z0000 Encounter for general adult medical examination without abnormal findings: Secondary | ICD-10-CM

## 2018-05-03 DIAGNOSIS — I10 Essential (primary) hypertension: Secondary | ICD-10-CM

## 2018-05-03 DIAGNOSIS — E782 Mixed hyperlipidemia: Secondary | ICD-10-CM

## 2018-05-03 LAB — BASIC METABOLIC PANEL
BUN: 25 mg/dL — ABNORMAL HIGH (ref 6–23)
CALCIUM: 9.3 mg/dL (ref 8.4–10.5)
CO2: 26 meq/L (ref 19–32)
CREATININE: 1.13 mg/dL (ref 0.40–1.50)
Chloride: 100 mEq/L (ref 96–112)
GFR: 66.77 mL/min (ref >60.00)
Glucose, Bld: 141 mg/dL — ABNORMAL HIGH (ref 70–99)
Potassium: 4.1 mEq/L (ref 3.5–5.1)
Sodium: 136 mEq/L (ref 135–145)

## 2018-05-03 LAB — HEMOGLOBIN A1C: Hgb A1c MFr Bld: 6.3 % (ref 4.6–6.5)

## 2018-05-03 LAB — HEPATIC FUNCTION PANEL
ALT: 36 U/L (ref 0–53)
AST: 39 U/L — ABNORMAL HIGH (ref 0–37)
Albumin: 4.3 g/dL (ref 3.5–5.2)
Alkaline Phosphatase: 61 U/L (ref 39–117)
BILIRUBIN TOTAL: 0.6 mg/dL (ref 0.2–1.2)
Bilirubin, Direct: 0.1 mg/dL (ref 0.0–0.3)
TOTAL PROTEIN: 7 g/dL (ref 6.0–8.3)

## 2018-05-03 LAB — LIPID PANEL
CHOLESTEROL: 174 mg/dL (ref 0–200)
HDL: 56.9 mg/dL (ref >39.00)
LDL CALC: 99 mg/dL (ref 0–99)
NonHDL: 116.99
Total CHOL/HDL Ratio: 3
Triglycerides: 88 mg/dL (ref 0.0–149.0)
VLDL: 17.6 mg/dL (ref 0.0–40.0)

## 2018-05-03 LAB — CBC WITH DIFFERENTIAL/PLATELET
BASOS ABS: 0.1 10*3/uL (ref 0.0–0.1)
Basophils Relative: 0.8 % (ref 0.0–3.0)
EOS PCT: 5.2 % — AB (ref 0.0–5.0)
Eosinophils Absolute: 0.3 10*3/uL (ref 0.0–0.7)
HCT: 37.5 % — ABNORMAL LOW (ref 39.0–52.0)
HEMOGLOBIN: 13 g/dL (ref 13.0–17.0)
Lymphocytes Relative: 24 % (ref 12.0–46.0)
Lymphs Abs: 1.6 10*3/uL (ref 0.7–4.0)
MCHC: 34.8 g/dL (ref 30.0–36.0)
MCV: 88.6 fl (ref 78.0–100.0)
MONO ABS: 0.7 10*3/uL (ref 0.1–1.0)
Monocytes Relative: 11.3 % (ref 3.0–12.0)
Neutro Abs: 3.8 10*3/uL (ref 1.4–7.7)
Neutrophils Relative %: 58.7 % (ref 43.0–77.0)
Platelets: 231 10*3/uL (ref 150.0–400.0)
RBC: 4.23 Mil/uL (ref 4.22–5.81)
RDW: 13.8 % (ref 11.5–15.5)
WBC: 6.5 10*3/uL (ref 4.0–10.5)

## 2018-05-03 LAB — URIC ACID: URIC ACID, SERUM: 10.2 mg/dL — AB (ref 4.0–7.8)

## 2018-05-03 LAB — PSA, MEDICARE: PSA: 1.01 ng/mL (ref 0.10–4.00)

## 2018-05-03 NOTE — Progress Notes (Signed)
Subjective:   Devin Garza is a 77 y.o. male who presents for Medicare Annual/Subsequent preventive examination.  Review of Systems:  N/A Cardiac Risk Factors include: advanced age (>56men, >37 women);male gender     Objective:    Vitals: BP (!) 142/90 (BP Location: Left Arm, Patient Position: Sitting, Cuff Size: Normal)   Pulse 88   Temp 98 F (36.7 C) (Oral)   Ht 5' 8.25" (1.734 m) Comment: no shoes  Wt 191 lb 12 oz (87 kg)   SpO2 96%   BMI 28.94 kg/m   Body mass index is 28.94 kg/m.  Advanced Directives 05/03/2018 09/02/2017 10/27/2016 07/23/2015  Does Patient Have a Medical Advance Directive? Yes Yes No No  Type of Paramedic of [place];Living will [place];Living will - -  Copy of [place] in Chart? No - copy requested No - copy requested - -  Would patient like information on creating a medical advance directive? - - No - patient declined information -    Tobacco Social History   Tobacco Use  Smoking Status Current Every Day Smoker  . Packs/day: 0.50  . Years: 40.00  . Pack years: 20.00  . Types: Cigarettes  Smokeless Tobacco Never Used  Tobacco Comment   cigars occassionally     Ready to quit: No Counseling given: No Comment: cigars occassionally   Clinical Intake:  Pre-visit preparation completed: Yes  Pain : No/denies pain Pain Score: 0-No pain     Nutritional Status: BMI 25 -29 Overweight Nutritional Risks: None Diabetes: No  How often do you need to have someone help you when you read instructions, pamphlets, or other written materials from your doctor or pharmacy?: 1 - Never What is the last grade level you completed in school?: Bachelors degree  Interpreter Needed?: No  Comments: pt lives alone Information entered by :: LPinson, LPN  Past Medical History:  Diagnosis Date  . Anemia   . Basal cell carcinoma of lip 2007  . ED (erectile dysfunction)   . GERD  (gastroesophageal reflux disease)   . HLD (hyperlipidemia)   . Hypertension   . Osteoarthritis   . Peripheral vascular disease ([place])    Occlusion and Stenosis of Carotid Artery  . Tobacco abuse   . Tubular adenoma of colon 2005  . Wears dentures    full upper and lower   Past Surgical History:  Procedure Laterality Date  . ANKLE ARTHROSCOPY W/ OPEN REPAIR    . COLONOSCOPY  2006   [place]-Tubular adenoma  . COLONOSCOPY N/A 07/23/2015   Procedure: COLONOSCOPY;  Surgeon: Lucilla Lame, MD;  Location: [facility];  Service: Gastroenterology;  Laterality: N/A;  WITH BIOPSIES---- TRANSVERSE COLON POLYP DESCENDING COLON POLYP  X  4  . ESOPHAGOGASTRODUODENOSCOPY N/A 07/23/2015   Procedure: ESOPHAGOGASTRODUODENOSCOPY (EGD);  Surgeon: Lucilla Lame, MD;  Location: [facility];  Service: Gastroenterology;  Laterality: N/A;  WITH BIOPSY--- DUODENAL BIOPSY GASTRIC BIOPSY  . ESOPHAGOGASTRODUODENOSCOPY N/A 10/28/2016   Procedure: ESOPHAGOGASTRODUODENOSCOPY (EGD);  Surgeon: Manya Silvas, MD;  Location: [facility] ENDOSCOPY;  Service: Endoscopy;  Laterality: N/A;  . ESOPHAGOGASTRODUODENOSCOPY (EGD) WITH PROPOFOL N/A 09/02/2017   Procedure: ESOPHAGOGASTRODUODENOSCOPY (EGD) WITH PROPOFOL;  Surgeon: Manya Silvas, MD;  Location: [facility] ENDOSCOPY;  Service: Endoscopy;  Laterality: N/A;  . SKIN CANCER EXCISION     LIP  . UPPER GI ENDOSCOPY  12/2009   Dr. Ivory Broad gastritisl negative for H. pylori; duodenal biopsies neg for sprue   Family History  Problem Relation Age of Onset  . Cancer Mother 65       LUNG  . Cancer Father        THROAT  . Hearing loss Father        HEART ATTACK, CAD  . Cancer Sister        OVARIAN   Social History   Socioeconomic History  . Marital status: Single    Spouse name: Not on file  . Number of children: 2  . Years of education: Not on file  . Highest education level: Not on file  Occupational History  . Occupation: retired Chief Operating Officer: RETIRED  Social Needs  . Financial resource strain: Not on file  . Food insecurity:    Worry: Not on file    Inability: Not on file  . Transportation needs:    Medical: Not on file    Non-medical: Not on file  Tobacco Use  . Smoking status: Current Every Day Smoker    Packs/day: 0.50    Years: 40.00    Pack years: 20.00    Types: Cigarettes  . Smokeless tobacco: Never Used  . Tobacco comment: cigars occassionally  Substance and Sexual Activity  . Alcohol use: Yes    Alcohol/week: 8.4 - 12.6 oz    Types: 14 - 21 Glasses of wine per week    Comment: 2-3 glasses of wine daily  . Drug use: No  . Sexual activity: Not on file  Lifestyle  . Physical activity:    Days per week: Not on file    Minutes per session: Not on file  . Stress: Not on file  Relationships  . Social connections:    Talks on phone: Not on file    Gets together: Not on file    Attends religious service: Not on file    Active member of club or organization: Not on file    Attends meetings of clubs or organizations: Not on file    Relationship status: Not on file  Other Topics Concern  . Not on file  Social History Narrative   Lives alone    Outpatient Encounter Medications as of 05/03/2018  Medication Sig  . atorvastatin (LIPITOR) 10 MG tablet Take 1 tablet (10 mg total) by mouth daily.  Marland Kitchen buPROPion (WELLBUTRIN XL) 150 MG 24 hr tablet Take 1 tablet (150 mg total) by mouth daily.  . Colchicine (MITIGARE) 0.6 MG CAPS Take 1 tablet by mouth 2 (two) times daily as needed.  . eszopiclone (LUNESTA) 2 MG TABS tablet Take 1 tablet (2 mg total) by mouth at bedtime as needed for sleep. Take immediately before bedtime  . loratadine (CLARITIN) 10 MG tablet Take 10 mg by mouth daily as needed for allergies.  . Multiple Vitamin (MULTIVITAMIN) tablet Take 1 tablet by mouth daily.  . valsartan-hydrochlorothiazide (DIOVAN HCT) 320-12.5 MG tablet Take 1 tablet by mouth daily.  . [DISCONTINUED] ferrous sulfate 325  (65 FE) MG tablet Take 325 mg by mouth daily with breakfast.  . [DISCONTINUED] ferrous sulfate 325 (65 FE) MG tablet Take by mouth.  . [DISCONTINUED] FLUZONE HIGH-DOSE 0.5 ML injection TO BE ADMINISTERED BY PHARMACIST FOR IMMUNIZATION  . [DISCONTINUED] PROAIR HFA 108 (90 Base) MCG/ACT inhaler USE 2 PUFFS 3 TIMES DAILY AS NEEDED FOR SHORTNESS OF BREATH/COUGHING  . [DISCONTINUED] vitamin C (ASCORBIC ACID) 500 MG tablet Take 500 mg by mouth.   No facility-administered encounter medications on file as of 05/03/2018.  Activities of Daily Living In your present state of health, do you have any difficulty performing the following activities: 05/03/2018  Hearing? N  Vision? N  Difficulty concentrating or making decisions? N  Walking or climbing stairs? N  Dressing or bathing? N  Doing errands, shopping? N  Preparing Food and eating ? N  Using the Toilet? N  In the past six months, have you accidently leaked urine? N  Do you have problems with loss of bowel control? N  Managing your Medications? N  Managing your Finances? N  Housekeeping or managing your Housekeeping? N  Some recent data might be hidden    Patient Care Team: Owens Loffler, MD as PCP - General   Assessment:   This is a routine wellness examination for Lio.   Hearing Screening   125Hz  250Hz  500Hz  1000Hz  2000Hz  3000Hz  4000Hz  6000Hz  8000Hz   Right ear:   40 40 40  0    Left ear:   40 40 40  0    Vision Screening Comments: Last vision exam in Jan 2019 with Dr. Thelma Comp    Exercise Activities and Dietary recommendations Current Exercise Habits: Home exercise routine, Type of exercise: walking;Other - see comments(golf 1-2 times weekly), Time (Minutes): 45, Frequency (Times/Week): 3, Weekly Exercise (Minutes/Week): 135, Intensity: Moderate, Exercise limited by: None identified  Goals    . Increase physical activity     Starting 05/03/2018, I will continue to walk at least 45 minutes 3 days per week and to play  golf 1-2 times per week.        Fall Risk Fall Risk  05/03/2018 04/27/2017 04/26/2016 04/23/2015 11/14/2013  Falls in the past year? No No No No No   Depression Screen PHQ 2/9 Scores 05/03/2018 04/27/2017 04/26/2016 04/23/2015  PHQ - 2 Score 0 0 0 0  PHQ- 9 Score 0 - - -    Cognitive Function MMSE - Mini Mental State Exam 05/03/2018  Orientation to time 5  Orientation to Place 5  Registration 3  Attention/ Calculation 0  Recall 0  Recall-comments unable to recall 3 of 3 words  Language- name 2 objects 0  Language- repeat 1  Language- follow 3 step command 3  Language- read & follow direction 0  Write a sentence 0  Copy design 0  Total score 17       PLEASE NOTE: A Mini-Cog screen was completed. Maximum score is 20. A value of 0 denotes this part of [place] MMSE was not completed or the patient failed this part of the Mini-Cog screening.   Mini-Cog Screening Orientation to Time - Max 5 pts Orientation to Place - Max 5 pts Registration - Max 3 pts Recall - Max 3 pts Language Repeat - Max 1 pts Language Follow 3 Step Command - Max 3 pts     Immunization History  Administered Date(s) Administered  . Influenza Split 09/10/2012, 09/24/2013  . Influenza, High Dose Seasonal PF 09/26/2016, 09/26/2017  . Pneumococcal Conjugate-13 04/23/2015  . Pneumococcal Polysaccharide-23 01/21/2011  . Td 12/28/2003  . Tdap 08/17/2017  . Zoster 10/29/2013    Screening Tests Health Maintenance  Topic Date Due  . INFLUENZA VACCINE  07/27/2018  . TETANUS/TDAP  08/18/2027  . PNA vac Low Risk Adult  Completed       Plan:     I have personally reviewed, addressed, and noted the following in the patient's chart:  A. Medical and social history B. Use of alcohol, tobacco or illicit drugs  C.  Current medications and supplements D. Functional ability and status E.  Nutritional status F.  Physical activity G. Advance directives H. List of other physicians I.  Hospitalizations, surgeries, and  ER visits in previous 12 months J.  [place] to include hearing, vision, cognitive, depression L. Referrals and appointments - none  In addition, I have reviewed and discussed with patient certain preventive protocols, quality metrics, and best practice recommendations. A written personalized care plan for preventive services as well as general preventive health recommendations were provided to patient.  See attached scanned questionnaire for additional information.   Signed,   Lindell Noe, MHA, BS, LPN Health Coach

## 2018-05-03 NOTE — Patient Instructions (Signed)
Devin Garza , Thank you for taking time to come for your Medicare Wellness Visit. I appreciate your ongoing commitment to your health goals. Please review the following plan we discussed and let me know if I can assist you in the future.   These are the goals we discussed: Goals    . Increase physical activity     Starting 05/03/2018, I will continue to walk at least 45 minutes 3 days per week and to play golf 1-2 times per week.        This is a list of the screening recommended for you and due dates:  Health Maintenance  Topic Date Due  . Flu Shot  07/27/2018  . Tetanus Vaccine  08/18/2027  . Pneumonia vaccines  Completed   Preventive Care for Adults  A healthy lifestyle and preventive care can promote health and wellness. Preventive health guidelines for adults include the following key practices.  . A routine yearly physical is a good way to check with your health care provider about your health and preventive screening. It is a chance to share any concerns and updates on your health and to receive a thorough exam.  . Visit your dentist for a routine exam and preventive care every 6 months. Brush your teeth twice a day and floss once a day. Good oral hygiene prevents tooth decay and gum disease.  . The frequency of eye exams is based on your age, health, family medical history, use  of contact lenses, and other factors. Follow your health care provider's recommendations for frequency of eye exams.  . Eat a healthy diet. Foods like vegetables, fruits, whole grains, low-fat dairy products, and lean protein foods contain the nutrients you need without too many calories. Decrease your intake of foods high in solid fats, added sugars, and salt. Eat the right amount of calories for you. Get information about a proper diet from your health care provider, if necessary.  . Regular physical exercise is one of the most important things you can do for your health. Most adults should get at  least 150 minutes of moderate-intensity exercise (any activity that increases your heart rate and causes you to sweat) each week. In addition, most adults need muscle-strengthening exercises on 2 or more days a week.  Silver Sneakers may be a benefit available to you. To determine eligibility, you may visit the website: www.silversneakers.com or contact program at 405-665-2538 Mon-Fri between 8AM-8PM.   . Maintain a healthy weight. The body mass index (BMI) is a screening tool to identify possible weight problems. It provides an estimate of body fat based on height and weight. Your health care provider can find your BMI and can help you achieve or maintain a healthy weight.   For adults 20 years and older: ? A BMI below 18.5 is considered underweight. ? A BMI of 18.5 to 24.9 is normal. ? A BMI of 25 to 29.9 is considered overweight. ? A BMI of 30 and above is considered obese.   . Maintain normal blood lipids and cholesterol levels by exercising and minimizing your intake of saturated fat. Eat a balanced diet with plenty of fruit and vegetables. Blood tests for lipids and cholesterol should begin at age 25 and be repeated every 5 years. If your lipid or cholesterol levels are high, you are over 50, or you are at high risk for heart disease, you may need your cholesterol levels checked more frequently. Ongoing high lipid and cholesterol levels should be treated with  medicines if diet and exercise are not working.  . If you smoke, find out from your health care provider how to quit. If you do not use tobacco, please do not start.  . If you choose to drink alcohol, please do not consume more than 2 drinks per day. One drink is considered to be 12 ounces (355 mL) of beer, 5 ounces (148 mL) of wine, or 1.5 ounces (44 mL) of liquor.  . If you are 15-53 years old, ask your health care provider if you should take aspirin to prevent strokes.  . Use sunscreen. Apply sunscreen liberally and repeatedly  throughout the day. You should seek shade when your shadow is shorter than you. Protect yourself by wearing long sleeves, pants, a wide-brimmed hat, and sunglasses year round, whenever you are outdoors.  . Once a month, do a whole body skin exam, using a mirror to look at the skin on your back. Tell your health care provider of new moles, moles that have irregular borders, moles that are larger than a pencil eraser, or moles that have changed in shape or color.

## 2018-05-03 NOTE — Telephone Encounter (Signed)
Copied from [place] [id number]. Topic: Quick Communication - Rx Refill/Question >> May 03, 2018  4:59 PM Arletha Grippe wrote: Medication: DIOVAN HCT 320-12.5 MG tablet Has the patient contacted their pharmacy? Yes.   (Agent: If no, request that the patient contact the pharmacy for the refill.) Preferred Pharm[facility] (with phone number [facility]  Agent: Please be advised that RX refills may take up to 3 business days. We ask that you follow-up with your pharmacy. Pt will be out in 4 days   Pt said he told Lias at his medicare awv today

## 2018-05-03 NOTE — Progress Notes (Signed)
I reviewed health advisor's note, was available for consultation, and agree with documentation and plan.   Signed,  Carisa Backhaus T. [facility], MD  

## 2018-05-03 NOTE — Progress Notes (Signed)
PCP notes:   Health maintenance:  No gaps identified.  Abnormal screenings:   Hearing - failed  Hearing Screening   125Hz  250Hz  500Hz  1000Hz  2000Hz  3000Hz  4000Hz  6000Hz  8000Hz   Right ear:   40 40 40  0    Left ear:   40 40 40  0     Mini-Cog score: 17/20 MMSE - Mini Mental State Exam 05/03/2018  Orientation to time 5  Orientation to Place 5  Registration 3  Attention/ Calculation 0  Recall 0  Recall-comments unable to recall 3 of 3 words  Language- name 2 objects 0  Language- repeat 1  Language- follow 3 step command 3  Language- read & follow direction 0  Write a sentence 0  Copy design 0  Total score 17   Patient concerns:   Refill request for [place]. Sent to PCP.   Nurse concerns:  None  Next PCP appt:   05/08/2018 @ 1020

## 2018-05-08 ENCOUNTER — Ambulatory Visit (INDEPENDENT_AMBULATORY_CARE_PROVIDER_SITE_OTHER): Payer: Medicare Other | Admitting: Family Medicine

## 2018-05-08 ENCOUNTER — Other Ambulatory Visit: Payer: Self-pay

## 2018-05-08 ENCOUNTER — Encounter: Payer: Self-pay | Admitting: Family Medicine

## 2018-05-08 VITALS — BP 152/80 | HR 91 | Temp 98.8°F | Ht 68.25 in | Wt 194.5 lb

## 2018-05-08 DIAGNOSIS — M1000 Idiopathic gout, unspecified site: Secondary | ICD-10-CM

## 2018-05-08 DIAGNOSIS — D509 Iron deficiency anemia, unspecified: Secondary | ICD-10-CM

## 2018-05-08 DIAGNOSIS — C4491 Basal cell carcinoma of skin, unspecified: Secondary | ICD-10-CM

## 2018-05-08 DIAGNOSIS — E7800 Pure hypercholesterolemia, unspecified: Secondary | ICD-10-CM | POA: Diagnosis not present

## 2018-05-08 DIAGNOSIS — I10 Essential (primary) hypertension: Secondary | ICD-10-CM | POA: Diagnosis not present

## 2018-05-08 DIAGNOSIS — G4700 Insomnia, unspecified: Secondary | ICD-10-CM

## 2018-05-08 DIAGNOSIS — R7303 Prediabetes: Secondary | ICD-10-CM | POA: Diagnosis not present

## 2018-05-08 MED ORDER — ATORVASTATIN CALCIUM 10 MG PO TABS: 10.0000 mg | ORAL_TABLET | Freq: Every day | ORAL | 3 refills | Status: DC

## 2018-05-08 MED ORDER — BUPROPION HCL ER (XL) 150 MG PO TB24: 150.0000 mg | ORAL_TABLET | Freq: Every day | ORAL | 3 refills | Status: DC

## 2018-05-08 MED ORDER — ESZOPICLONE 2 MG PO TABS: 2.0000 mg | ORAL_TABLET | Freq: Every evening | ORAL | 1 refills | Status: DC | PRN

## 2018-05-08 NOTE — Progress Notes (Signed)
Dr. Frederico Hamman T. Zyliah Schier, MD, [facility] [place], [address] Phone: [phone] Fax: [phone]  05/08/2018  Patient: Devin Garza, MRN: [id number], DOB: January 03, 1941, 77 y.o.  Primary Physician:  Owens Loffler, MD   Chief Complaint  Patient presents with  . Annual Exam    Part 2   Subjective:   Devin Garza is a 77 y.o. very pleasant male patient who presents with the following:  Very pleasant gentleman who I have known for about 10 years who presents in follow-up for general medical care after his Medicare wellness exam.  He did have a GI bleed last year, and has some very significant anemia.  He also does have some high blood pressure.  He also has had gout, but this is really not been bothersome to him at all, he has some colchicine available at home.  He does have some modestly elevated blood sugars, but these have not been in the diabetic range, and have remained in prediabetic numbers for greater than a decade.  He also does have some hyperlipidemia, and he takes Lipitor.  He has decreased his alcohol consumption quite a bit, but he generally drinks about 2 per night, occasionally more than this.  Past Medical History, Surgical History, Social History, Family History, Problem List, Medications, and Allergies have been reviewed and updated if relevant.  Patient Active Problem List   Diagnosis Date Noted  . Gout 03/21/2017  . History of upper gastrointestinal bleeding 11/10/2016  . Angiodysplasia of stomach and duodenum without hemorrhage   . Benign neoplasm of transverse colon   . Benign neoplasm of descending colon   . Esophageal reflux 05/28/2015  . Tubular adenoma of colon 05/28/2015  . Prediabetes 04/21/2011  . Alcohol abuse 01/21/2011  . Anemia, unspecified 11/12/2009  . Basal cell carcinoma of skin 09/03/2008  . Occlusion and stenosis of carotid artery 08/28/2008  . Insomnia 08/28/2008    . Hyperlipidemia 08/27/2008  . Essential hypertension 08/27/2008    Past Medical History:  Diagnosis Date  . Anemia   . Basal cell adenocarcinoma 09/03/2008   Qualifier: Diagnosis of  By: Lorelei Pont MD, Frederico Hamman    Overview:  Overview:  Qualifier: Diagnosis of  By: Lorelei Pont MD, Frederico Hamman   . Basal cell carcinoma of lip 2007  . ED (erectile dysfunction)   . GERD (gastroesophageal reflux disease)   . HLD (hyperlipidemia)   . Hypertension   . Osteoarthritis   . Peripheral vascular disease ([place])    Occlusion and Stenosis of Carotid Artery  . Tobacco abuse   . Tubular adenoma of colon 2005  . Wears dentures    full upper and lower    Past Surgical History:  Procedure Laterality Date  . ANKLE ARTHROSCOPY W/ OPEN REPAIR    . COLONOSCOPY  2006   [place]-Tubular adenoma  . COLONOSCOPY N/A 07/23/2015   Procedure: COLONOSCOPY;  Surgeon: Lucilla Lame, MD;  Location: [facility];  Service: Gastroenterology;  Laterality: N/A;  WITH BIOPSIES---- TRANSVERSE COLON POLYP DESCENDING COLON POLYP  X  4  . ESOPHAGOGASTRODUODENOSCOPY N/A 07/23/2015   Procedure: ESOPHAGOGASTRODUODENOSCOPY (EGD);  Surgeon: Lucilla Lame, MD;  Location: [facility];  Service: Gastroenterology;  Laterality: N/A;  WITH BIOPSY--- DUODENAL BIOPSY GASTRIC BIOPSY  . ESOPHAGOGASTRODUODENOSCOPY N/A 10/28/2016   Procedure: ESOPHAGOGASTRODUODENOSCOPY (EGD);  Surgeon: Manya Silvas, MD;  Location[facility] END[facility]COPY;  Service: Endoscopy;  Laterality: N/A;  . ESOPHAGOGASTRODUODENOSCOPY (EGD) WITH PROPOFOL N/A 09/02/2017   Procedure:  ESOPHAGOGASTRODUODENOSCOPY (EGD) WITH PROPOFOL;  Surgeon: Manya Silvas, MD;  Location: [facility] ENDOSCOPY;  Service: Endoscopy;  Laterality: N/A;  . SKIN CANCER EXCISION     LIP  . UPPER GI ENDOSCOPY  12/2009   Dr. Ivory Broad gastritisl negative for H. pylori; duodenal biopsies neg for sprue    Social History   Socioeconomic History  . Marital status: Single    Spouse name: Not on file  .  Number of children: 2  . Years of education: Not on file  . Highest education level: Not on file  Occupational History  . Occupation: retired Building surveyor: RETIRED  Social Needs  . Financial resource strain: Not on file  . Food insecurity:    Worry: Not on file    Inability: Not on file  . Transportation needs:    Medical: Not on file    Non-medical: Not on file  Tobacco Use  . Smoking status: Current Every Day Smoker    Packs/day: 0.50    Years: 40.00    Pack years: 20.00    Types: Cigarettes  . Smokeless tobacco: Never Used  . Tobacco comment: cigars occassionally  Substance and Sexual Activity  . Alcohol use: Yes    Alcohol/week: 8.4 - 12.6 oz    Types: 14 - 21 Glasses of wine per week    Comment: 2-3 glasses of wine daily  . Drug use: No  . Sexual activity: Not on file  Lifestyle  . Physical activity:    Days per week: Not on file    Minutes per session: Not on file  . Stress: Not on file  Relationships  . Social connections:    Talks on phone: Not on file    Gets together: Not on file    Attends religious service: Not on file    Active member of club or organization: Not on file    Attends meetings of clubs or organizations: Not on file    Relationship status: Not on file  . Intimate partner violence:    Fear of current or ex partner: Not on file    Emotionally abused: Not on file    Physically abused: Not on file    Forced sexual activity: Not on file  Other Topics Concern  . Not on file  Social History Narrative   Lives alone    Family History  Problem Relation Age of Onset  . Cancer Mother 27       LUNG  . Cancer Father        THROAT  . Hearing loss Father        HEART ATTACK, CAD  . Cancer Sister        OVARIAN    No Known Allergies  Medication list reviewed and updated in full in [facility].   General: Denies fever, chills, sweats. No significant weight loss. Eyes: Denies blurring,significant itching ENT: Denies  earache, sore throat, and hoarseness. Cardiovascular: Denies chest pains, palpitations, dyspnea on exertion Respiratory: Denies cough, dyspnea at rest,wheeezing Breast: no concerns about lumps GI: Denies nausea, vomiting, diarrhea, constipation, change in bowel habits, abdominal pain, melena, hematochezia GU: Denies penile discharge, ED, urinary flow / outflow problems. No STD concerns. Musculoskeletal: Denies back pain, joint pain Derm: 1 MONTH SKIN LESION NOSE Neuro: Denies  paresthesias, frequent falls, frequent headaches Psych: Denies depression, anxiety Endocrine: Denies cold intolerance, heat intolerance, polydipsia Heme: Denies enlarged lymph nodes Allergy: No hayfever   Objective:   BP Marland Kitchen)  152/80   Pulse 91   Temp 98.8 F (37.1 C) (Oral)   Ht 5' 8.25" (1.734 m)   Wt 194 lb 8 oz (88.2 kg)   BMI 29.36 kg/m   No exam data present  GEN: well developed, well nourished, no acute distress Eyes: conjunctiva and lids normal, PERRLA, EOMI ENT: TM clear, nares clear, oral exam WNL Neck: supple, no lymphadenopathy, no thyromegaly, no JVD Pulm: clear to auscultation and percussion, respiratory effort normal CV: regular rate and rhythm, S1-S2, no murmur, rub or gallop, no bruits, peripheral pulses normal and symmetric, no cyanosis, clubbing, edema or varicosities GI: soft, non-tender; no hepatosplenomegaly, masses; active bowel sounds all quadrants GU: no hernia, testicular mass, penile discharge Lymph: no cervical, axillary or inguinal adenopathy MSK: gait normal, muscle tone and strength WNL, no joint swelling, effusions, discoloration, crepitus  SKIN: PEARLY ULCERATED LESION TIP OF NOSE Neuro: normal mental status, normal strength, sensation, and motion Psych: alert; oriented to person, place and time, normally interactive and not anxious or depressed in appearance.  Laboratory and Imaging Data: Results for orders placed or performed in visit on 05/03/18  Lipid Panel  Result  Value Ref Range   Cholesterol 174 0 - 200 mg/dL   Triglycerides 88.0 0.0 - 149.0 mg/dL   HDL 56.90 >39.00 mg/dL   VLDL 17.6 0.0 - 40.0 mg/dL   LDL Cholesterol 99 0 - 99 mg/dL   Total CHOL/HDL Ratio 3    NonHDL 116.99   PSA, Medicare ([place])  Result Value Ref Range   PSA 1.01 0.10 - 4.00 ng/ml  Basic Metabolic Panel  Result Value Ref Range   Sodium 136 135 - 145 mEq/L   Potassium 4.1 3.5 - 5.1 mEq/L   Chloride 100 96 - 112 mEq/L   CO2 26 19 - 32 mEq/L   Glucose, Bld 141 (H) 70 - 99 mg/dL   BUN 25 (H) 6 - 23 mg/dL   Creatinine, Ser 1.13 0.40 - 1.50 mg/dL   Calcium 9.3 8.4 - 10.5 mg/dL   GFR 66.77 >60.00 mL/min  Hemoglobin A1c  Result Value Ref Range   Hgb A1c MFr Bld 6.3 4.6 - 6.5 %  Hepatic Function Panel  Result Value Ref Range   Total Bilirubin 0.6 0.2 - 1.2 mg/dL   Bilirubin, Direct 0.1 0.0 - 0.3 mg/dL   Alkaline Phosphatase 61 39 - 117 U/L   AST 39 (H) 0 - 37 U/L   ALT 36 0 - 53 U/L   Total Protein 7.0 6.0 - 8.3 g/dL   Albumin 4.3 3.5 - 5.2 g/dL  Uric acid  Result Value Ref Range   Uric Acid, Serum 10.2 (H) 4.0 - 7.8 mg/dL  CBC with Differential/Platelet  Result Value Ref Range   WBC 6.5 4.0 - 10.5 K/uL   RBC 4.23 4.22 - 5.81 Mil/uL   Hemoglobin 13.0 13.0 - 17.0 g/dL   HCT 37.5 (L) 39.0 - 52.0 %   MCV 88.6 78.0 - 100.0 fl   MCHC 34.8 30.0 - 36.0 g/dL   RDW 13.8 11.5 - 15.5 %   Platelets 231.0 150.0 - 400.0 K/uL   Neutrophils Relative % 58.7 43.0 - 77.0 %   Lymphocytes Relative 24.0 12.0 - 46.0 %   Monocytes Relative 11.3 3.0 - 12.0 %   Eosinophils Relative 5.2 (H) 0.0 - 5.0 %   Basophils Relative 0.8 0.0 - 3.0 %   Neutro Abs 3.8 1.4 - 7.7 K/uL   Lymphs Abs 1.6 0.7 - 4.0 K/uL  Monocytes Absolute 0.7 0.1 - 1.0 K/uL   Eosinophils Absolute 0.3 0.0 - 0.7 K/uL   Basophils Absolute 0.1 0.0 - 0.1 K/uL     Assessment and Plan:   Pure hypercholesterolemia  Essential hypertension  Prediabetes  Insomnia, unspecified type  Iron deficiency anemia,  unspecified iron deficiency anemia type  Basal cell adenocarcinoma  Idiopathic gout, unspecified chronicity, unspecified site  Overall, he is doing well.  Anemia has stabilized.  I do think he likely has a basal cell carcinoma on his nose, and he is going to call Dr. Nehemiah Massed when he gets home.  The risk of his labs are pretty stable.  He is still pretty significantly prediabetic, he is going to continue to work on that.  He also does continue to smoke, he understands the risks, but he is not even pre-contemplative at this point.  Follow-up: 1 year  Meds ordered this encounter  Medications  . eszopiclone (LUNESTA) 2 MG TABS tablet    Sig: Take 1 tablet (2 mg total) by mouth at bedtime as needed for sleep. Take immediately before bedtime    Dispense:  90 tablet    Refill:  1  . buPROPion (WELLBUTRIN XL) 150 MG 24 hr tablet    Sig: Take 1 tablet (150 mg total) by mouth daily.    Dispense:  90 tablet    Refill:  3  . atorvastatin (LIPITOR) 10 MG tablet    Sig: Take 1 tablet (10 mg total) by mouth daily.    Dispense:  90 tablet    Refill:  3    Signed,  Shanayah Kaffenberger T. Elysia Grand, MD   Allergies as of 05/08/2018   No Known Allergies     Medication List        Accurate as of 05/08/18 11:59 PM. Always use your most recent med list.          atorvastatin 10 MG tablet Commonly known as:  LIPITOR Take 1 tablet (10 mg total) by mouth daily.   buPROPion 150 MG 24 hr tablet Commonly known as:  WELLBUTRIN XL Take 1 tablet (150 mg total) by mouth daily.   Colchicine 0.6 MG Caps Commonly known as:  MITIGARE Take 1 tablet by mouth 2 (two) times daily as needed.   DIOVAN HCT 320-12.5 MG tablet Generic drug:  valsartan-hydrochlorothiazide TAKE 1 TABLET DAILY   esomeprazole 20 MG capsule Commonly known as:  NEXIUM Take 40 mg by mouth 2 (two) times daily before a meal.   eszopiclone 2 MG Tabs tablet Commonly known as:  LUNESTA Take 1 tablet (2 mg total) by mouth at bedtime  as needed for sleep. Take immediately before bedtime   loratadine 10 MG tablet Commonly known as:  CLARITIN Take 10 mg by mouth daily as needed for allergies.   multivitamin tablet Take 1 tablet by mouth daily.

## 2018-05-09 ENCOUNTER — Encounter: Payer: Self-pay | Admitting: Family Medicine

## 2018-05-10 DIAGNOSIS — L821 Other seborrheic keratosis: Secondary | ICD-10-CM | POA: Diagnosis not present

## 2018-05-10 DIAGNOSIS — C44311 Basal cell carcinoma of skin of nose: Secondary | ICD-10-CM | POA: Diagnosis not present

## 2018-05-10 DIAGNOSIS — L570 Actinic keratosis: Secondary | ICD-10-CM | POA: Diagnosis not present

## 2018-05-10 DIAGNOSIS — L820 Inflamed seborrheic keratosis: Secondary | ICD-10-CM | POA: Diagnosis not present

## 2018-05-10 DIAGNOSIS — D485 Neoplasm of uncertain behavior of skin: Secondary | ICD-10-CM | POA: Diagnosis not present

## 2018-05-10 DIAGNOSIS — L578 Other skin changes due to chronic exposure to nonionizing radiation: Secondary | ICD-10-CM | POA: Diagnosis not present

## 2018-05-29 ENCOUNTER — Encounter: Payer: Self-pay | Admitting: Podiatry

## 2018-05-29 ENCOUNTER — Ambulatory Visit (INDEPENDENT_AMBULATORY_CARE_PROVIDER_SITE_OTHER): Payer: Medicare Other | Admitting: Podiatry

## 2018-05-29 DIAGNOSIS — B351 Tinea unguium: Secondary | ICD-10-CM

## 2018-05-29 DIAGNOSIS — L600 Ingrowing nail: Secondary | ICD-10-CM

## 2018-05-29 DIAGNOSIS — M79609 Pain in unspecified limb: Secondary | ICD-10-CM | POA: Diagnosis not present

## 2018-05-29 NOTE — Progress Notes (Signed)
Complaint:  Visit Type: Patient returns to my office for continued preventative foot care services. Complaint: Patient states" my nails have grown long and thick and become painful to walk and wear shoes"  The patient presents for preventative foot care services. No changes to ROS.  Podiatric Exam: Vascular: dorsalis pedis and posterior tibial pulses are palpable bilateral. Capillary return is immediate. Temperature gradient is WNL. Skin turgor WNL  Sensorium: Normal Semmes Weinstein monofilament test. Normal tactile sensation bilaterally. Nail Exam: Pt has thick disfigured discolored nails with subungual debris noted bilateral entire nail hallux through fifth toenails.  Ingrown toenail medial border hallux  B/L. Ulcer Exam: There is no evidence of ulcer or pre-ulcerative changes or infection. Orthopedic Exam: Muscle tone and strength are WNL. No limitations in general ROM. No crepitus or effusions noted. HAV  B/L.  Hammer toe second right Skin: No Porokeratosis. No infection or ulcers  Diagnosis:  Onychomycosis, , Pain in right toe, pain in left toes  Treatment & Plan Procedures and Treatment: Consent by patient was obtained for treatment procedures. The patient understood the discussion of treatment and procedures well. All questions were answered thoroughly reviewed. Debridement of mycotic and hypertrophic toenails, 1 through 5 bilateral and clearing of subungual debris. No ulceration, no infection noted. ABN signed for 2019. Return Visit-Office Procedure: Patient instructed to return to the office for a follow up visit 10 weeks  for continued evaluation and treatment.    Gardiner Barefoot DPM

## 2018-07-05 ENCOUNTER — Telehealth: Payer: Self-pay

## 2018-07-05 NOTE — Telephone Encounter (Signed)
Lmov for patient to call back to schedule 1 year Carotid fu  Will try again at a later time

## 2018-07-07 ENCOUNTER — Other Ambulatory Visit: Payer: Self-pay | Admitting: Anesthesiology

## 2018-07-07 DIAGNOSIS — I6523 Occlusion and stenosis of bilateral carotid arteries: Secondary | ICD-10-CM

## 2018-07-07 NOTE — Telephone Encounter (Signed)
Patient scheduled 07/17/18  Nothing else needed.

## 2018-07-17 ENCOUNTER — Ambulatory Visit (INDEPENDENT_AMBULATORY_CARE_PROVIDER_SITE_OTHER): Payer: Medicare Other

## 2018-07-17 DIAGNOSIS — I6523 Occlusion and stenosis of bilateral carotid arteries: Secondary | ICD-10-CM | POA: Diagnosis not present

## 2018-08-07 ENCOUNTER — Ambulatory Visit (INDEPENDENT_AMBULATORY_CARE_PROVIDER_SITE_OTHER): Payer: Medicare Other | Admitting: Podiatry

## 2018-08-07 ENCOUNTER — Encounter: Payer: Self-pay | Admitting: Podiatry

## 2018-08-07 DIAGNOSIS — M79676 Pain in unspecified toe(s): Secondary | ICD-10-CM | POA: Diagnosis not present

## 2018-08-07 DIAGNOSIS — M79609 Pain in unspecified limb: Principal | ICD-10-CM

## 2018-08-07 DIAGNOSIS — B351 Tinea unguium: Secondary | ICD-10-CM | POA: Diagnosis not present

## 2018-08-07 DIAGNOSIS — L608 Other nail disorders: Secondary | ICD-10-CM | POA: Diagnosis not present

## 2018-08-07 NOTE — Progress Notes (Signed)
Complaint:  Visit Type: Patient returns to my office for continued preventative foot care services. Complaint: Patient states" my nails have grown long and thick and become painful to walk and wear shoes"  The patient presents for preventative foot care services. No changes to ROS.  Podiatric Exam: Vascular: dorsalis pedis and posterior tibial pulses are palpable bilateral. Capillary return is immediate. Temperature gradient is WNL. Skin turgor WNL  Sensorium: Normal Semmes Weinstein monofilament test. Normal tactile sensation bilaterally. Nail Exam: Pt has thick disfigured discolored nails with subungual debris noted bilateral entire nail hallux through fifth toenails.  Ingrown toenail medial border hallux  B/L. Ulcer Exam: There is no evidence of ulcer or pre-ulcerative changes or infection. Orthopedic Exam: Muscle tone and strength are WNL. No limitations in general ROM. No crepitus or effusions noted. HAV  B/L.  Hammer toe second right Skin: No Porokeratosis. No infection or ulcers  Diagnosis:  Onychomycosis, , Pain in right toe, pain in left toes,  Pincer nails   Treatment & Plan Procedures and Treatment: Consent by patient was obtained for treatment procedures. The patient understood the discussion of treatment and procedures well. All questions were answered thoroughly reviewed. Debridement of mycotic and hypertrophic toenails, 1 through 5 bilateral and clearing of subungual debris. No ulceration, no infection noted. ABN signed for 2019. Return Visit-Office Procedure: Patient instructed to return to the office for a follow up visit 10 weeks  for continued evaluation and treatment.    Gardiner Barefoot DPM

## 2018-08-09 DIAGNOSIS — C44311 Basal cell carcinoma of skin of nose: Secondary | ICD-10-CM | POA: Diagnosis not present

## 2018-09-18 DIAGNOSIS — D1800 Hemangioma unspecified site: Secondary | ICD-10-CM | POA: Diagnosis not present

## 2018-09-18 DIAGNOSIS — L570 Actinic keratosis: Secondary | ICD-10-CM | POA: Diagnosis not present

## 2018-09-18 DIAGNOSIS — Z85828 Personal history of other malignant neoplasm of skin: Secondary | ICD-10-CM | POA: Diagnosis not present

## 2018-09-18 DIAGNOSIS — L578 Other skin changes due to chronic exposure to nonionizing radiation: Secondary | ICD-10-CM | POA: Diagnosis not present

## 2018-09-18 DIAGNOSIS — Z1283 Encounter for screening for malignant neoplasm of skin: Secondary | ICD-10-CM | POA: Diagnosis not present

## 2018-09-18 DIAGNOSIS — L821 Other seborrheic keratosis: Secondary | ICD-10-CM | POA: Diagnosis not present

## 2018-09-18 DIAGNOSIS — L820 Inflamed seborrheic keratosis: Secondary | ICD-10-CM | POA: Diagnosis not present

## 2018-09-18 DIAGNOSIS — D225 Melanocytic nevi of trunk: Secondary | ICD-10-CM | POA: Diagnosis not present

## 2018-09-18 DIAGNOSIS — L812 Freckles: Secondary | ICD-10-CM | POA: Diagnosis not present

## 2018-10-16 ENCOUNTER — Ambulatory Visit (INDEPENDENT_AMBULATORY_CARE_PROVIDER_SITE_OTHER): Payer: Medicare Other | Admitting: Podiatry

## 2018-10-16 ENCOUNTER — Encounter: Payer: Self-pay | Admitting: Podiatry

## 2018-10-16 DIAGNOSIS — B351 Tinea unguium: Secondary | ICD-10-CM

## 2018-10-16 DIAGNOSIS — M79609 Pain in unspecified limb: Secondary | ICD-10-CM | POA: Diagnosis not present

## 2018-10-16 DIAGNOSIS — L608 Other nail disorders: Secondary | ICD-10-CM

## 2018-10-16 NOTE — Progress Notes (Signed)
Complaint:  Visit Type: Patient returns to my office for continued preventative foot care services. Complaint: Patient states" my nails have grown long and thick and become painful to walk and wear shoes"  The patient presents for preventative foot care services. No changes to ROS.  Podiatric Exam: Vascular: dorsalis pedis and posterior tibial pulses are palpable bilateral. Capillary return is immediate. Temperature gradient is WNL. Skin turgor WNL  Sensorium: Normal Semmes Weinstein monofilament test. Normal tactile sensation bilaterally. Nail Exam: Pt has thick disfigured discolored nails with subungual debris noted bilateral entire nail hallux through fifth toenails.  Ingrown toenail medial border hallux  B/L. Dried blood medial border left hallux.  No infection. Ulcer Exam: There is no evidence of ulcer or pre-ulcerative changes or infection. Orthopedic Exam: Muscle tone and strength are WNL. No limitations in general ROM. No crepitus or effusions noted. HAV  B/L.  Hammer toe second right Skin: No Porokeratosis. No infection or ulcers  Diagnosis:  Onychomycosis, , Pain in right toe, pain in left toes,  Pincer nails   Treatment & Plan Procedures and Treatment: Consent by patient was obtained for treatment procedures. The patient understood the discussion of treatment and procedures well. All questions were answered thoroughly reviewed. Debridement of mycotic and hypertrophic toenails, 1 through 5 bilateral and clearing of subungual debris. No ulceration, no infection noted. ABN signed for 2019. Return Visit-Office Procedure: Patient instructed to return to the office for a follow up visit 10 weeks  for continued evaluation and treatment.    Ronin Crager DPM 

## 2018-10-17 DIAGNOSIS — Z23 Encounter for immunization: Secondary | ICD-10-CM | POA: Diagnosis not present

## 2018-12-25 ENCOUNTER — Ambulatory Visit (INDEPENDENT_AMBULATORY_CARE_PROVIDER_SITE_OTHER): Payer: Medicare Other | Admitting: Podiatry

## 2018-12-25 ENCOUNTER — Encounter: Payer: Self-pay | Admitting: Podiatry

## 2018-12-25 DIAGNOSIS — B351 Tinea unguium: Secondary | ICD-10-CM | POA: Diagnosis not present

## 2018-12-25 DIAGNOSIS — M79609 Pain in unspecified limb: Secondary | ICD-10-CM | POA: Diagnosis not present

## 2018-12-25 DIAGNOSIS — L600 Ingrowing nail: Secondary | ICD-10-CM

## 2018-12-25 DIAGNOSIS — L608 Other nail disorders: Secondary | ICD-10-CM

## 2018-12-25 NOTE — Progress Notes (Signed)
Complaint:  Visit Type: Patient returns to my office for continued preventative foot care services. Complaint: Patient states" my nails have grown long and thick and become painful to walk and wear shoes"  The patient presents for preventative foot care services. No changes to ROS.  Podiatric Exam: Vascular: dorsalis pedis and posterior tibial pulses are palpable bilateral. Capillary return is immediate. Temperature gradient is WNL. Skin turgor WNL  Sensorium: Normal Semmes Weinstein monofilament test. Normal tactile sensation bilaterally. Nail Exam: Pt has thick disfigured discolored nails with subungual debris noted bilateral entire nail hallux through fifth toenails.  Ingrown toenail medial border hallux  B/L. Dried blood medial border left hallux.  No infection. Ulcer Exam: There is no evidence of ulcer or pre-ulcerative changes or infection. Orthopedic Exam: Muscle tone and strength are WNL. No limitations in general ROM. No crepitus or effusions noted. HAV  B/L.  Hammer toe second right Skin: No Porokeratosis. No infection or ulcers  Diagnosis:  Onychomycosis, , Pain in right toe, pain in left toes,  Pincer nails   Treatment & Plan Procedures and Treatment: Consent by patient was obtained for treatment procedures. The patient understood the discussion of treatment and procedures well. All questions were answered thoroughly reviewed. Debridement of mycotic and hypertrophic toenails, 1 through 5 bilateral and clearing of subungual debris. No ulceration, no infection noted. ABN signed for 2019. Return Visit-Office Procedure: Patient instructed to return to the office for a follow up visit 10 weeks  for continued evaluation and treatment.    Gardiner Barefoot DPM

## 2019-01-08 ENCOUNTER — Other Ambulatory Visit: Payer: Self-pay | Admitting: *Deleted

## 2019-01-08 MED ORDER — ESZOPICLONE 2 MG PO TABS: 2.0000 mg | ORAL_TABLET | Freq: Every evening | ORAL | 1 refills | Status: DC | PRN

## 2019-01-08 NOTE — Telephone Encounter (Signed)
Last office visit 05/08/2018 for CPE.  Last refilled 05/08/2018 for #90 with 1 refill.  Next Appt: 05/23/2019 for CPE.

## 2019-03-05 ENCOUNTER — Encounter: Payer: Self-pay | Admitting: Podiatry

## 2019-03-05 ENCOUNTER — Ambulatory Visit (INDEPENDENT_AMBULATORY_CARE_PROVIDER_SITE_OTHER): Payer: Medicare Other | Admitting: Podiatry

## 2019-03-05 DIAGNOSIS — M79676 Pain in unspecified toe(s): Secondary | ICD-10-CM | POA: Diagnosis not present

## 2019-03-05 DIAGNOSIS — L608 Other nail disorders: Secondary | ICD-10-CM

## 2019-03-05 DIAGNOSIS — B351 Tinea unguium: Secondary | ICD-10-CM

## 2019-03-05 DIAGNOSIS — L600 Ingrowing nail: Secondary | ICD-10-CM

## 2019-03-05 DIAGNOSIS — M79609 Pain in unspecified limb: Principal | ICD-10-CM

## 2019-03-05 NOTE — Progress Notes (Signed)
Complaint:  Visit Type: Patient returns to my office for continued preventative foot care services. Complaint: Patient states" my nails have grown long and thick and become painful to walk and wear shoes"  The patient presents for preventative foot care services. No changes to ROS.  Podiatric Exam: Vascular: dorsalis pedis and posterior tibial pulses are palpable bilateral. Capillary return is immediate. Temperature gradient is WNL. Skin turgor WNL  Sensorium: Normal Semmes Weinstein monofilament test. Normal tactile sensation bilaterally. Nail Exam: Pt has thick disfigured discolored nails with subungual debris noted bilateral entire nail hallux through fifth toenails.  Ingrown toenail medial border hallux  B/L. Dried blood medial border left hallux.  No infection. Ulcer Exam: There is no evidence of ulcer or pre-ulcerative changes or infection. Orthopedic Exam: Muscle tone and strength are WNL. No limitations in general ROM. No crepitus or effusions noted. HAV  B/L.  Hammer toe second right Skin: No Porokeratosis. No infection or ulcers  Diagnosis:  Onychomycosis, , Pain in right toe, pain in left toes,  Pincer nails   Treatment & Plan Procedures and Treatment: Consent by patient was obtained for treatment procedures. The patient understood the discussion of treatment and procedures well. All questions were answered thoroughly reviewed. Debridement of mycotic and hypertrophic toenails, 1 through 5 bilateral and clearing of subungual debris. No ulceration, no infection noted.  Return Visit-Office Procedure: Patient instructed to return to the office for a follow up visit 10 weeks  for continued evaluation and treatment.    Shawnise Peterkin DPM 

## 2019-04-12 ENCOUNTER — Other Ambulatory Visit: Payer: Self-pay | Admitting: Family Medicine

## 2019-05-09 DIAGNOSIS — D225 Melanocytic nevi of trunk: Secondary | ICD-10-CM | POA: Diagnosis not present

## 2019-05-09 DIAGNOSIS — Z1283 Encounter for screening for malignant neoplasm of skin: Secondary | ICD-10-CM | POA: Diagnosis not present

## 2019-05-09 DIAGNOSIS — L570 Actinic keratosis: Secondary | ICD-10-CM | POA: Diagnosis not present

## 2019-05-09 DIAGNOSIS — L578 Other skin changes due to chronic exposure to nonionizing radiation: Secondary | ICD-10-CM | POA: Diagnosis not present

## 2019-05-09 DIAGNOSIS — L812 Freckles: Secondary | ICD-10-CM | POA: Diagnosis not present

## 2019-05-09 DIAGNOSIS — L820 Inflamed seborrheic keratosis: Secondary | ICD-10-CM | POA: Diagnosis not present

## 2019-05-09 DIAGNOSIS — L821 Other seborrheic keratosis: Secondary | ICD-10-CM | POA: Diagnosis not present

## 2019-05-09 DIAGNOSIS — Z85828 Personal history of other malignant neoplasm of skin: Secondary | ICD-10-CM | POA: Diagnosis not present

## 2019-05-14 ENCOUNTER — Ambulatory Visit (INDEPENDENT_AMBULATORY_CARE_PROVIDER_SITE_OTHER): Payer: Medicare Other | Admitting: Podiatry

## 2019-05-14 ENCOUNTER — Encounter: Payer: Self-pay | Admitting: Podiatry

## 2019-05-14 ENCOUNTER — Other Ambulatory Visit: Payer: Self-pay

## 2019-05-14 VITALS — Temp 97.8°F

## 2019-05-14 DIAGNOSIS — B351 Tinea unguium: Secondary | ICD-10-CM

## 2019-05-14 DIAGNOSIS — L608 Other nail disorders: Secondary | ICD-10-CM

## 2019-05-14 DIAGNOSIS — L600 Ingrowing nail: Secondary | ICD-10-CM

## 2019-05-14 DIAGNOSIS — M79676 Pain in unspecified toe(s): Secondary | ICD-10-CM

## 2019-05-14 NOTE — Progress Notes (Signed)
Complaint:  Visit Type: Patient returns to my office for continued preventative foot care services. Complaint: Patient states" my nails have grown long and thick and become painful to walk and wear shoes"  The patient presents for preventative foot care services. No changes to ROS.  Podiatric Exam: Vascular: dorsalis pedis and posterior tibial pulses are palpable bilateral. Capillary return is immediate. Temperature gradient is WNL. Skin turgor WNL  Sensorium: Normal Semmes Weinstein monofilament test. Normal tactile sensation bilaterally. Nail Exam: Pt has thick disfigured discolored nails with subungual debris noted bilateral entire nail hallux through fifth toenails.  Ingrown toenail medial border hallux  B/L. Dried blood medial border left hallux.  No infection. Ulcer Exam: There is no evidence of ulcer or pre-ulcerative changes or infection. Orthopedic Exam: Muscle tone and strength are WNL. No limitations in general ROM. No crepitus or effusions noted. HAV  B/L.  Hammer toe second right Skin: No Porokeratosis. No infection or ulcers  Diagnosis:  Onychomycosis, , Pain in right toe, pain in left toes,  Pincer nails   Treatment & Plan Procedures and Treatment: Consent by patient was obtained for treatment procedures. The patient understood the discussion of treatment and procedures well. All questions were answered thoroughly reviewed. Debridement of mycotic and hypertrophic toenails, 1 through 5 bilateral and clearing of subungual debris. No ulceration, no infection noted.  Return Visit-Office Procedure: Patient instructed to return to the office for a follow up visit 10 weeks  for continued evaluation and treatment.    Gardiner Barefoot DPM

## 2019-05-18 ENCOUNTER — Ambulatory Visit (INDEPENDENT_AMBULATORY_CARE_PROVIDER_SITE_OTHER): Payer: Medicare Other

## 2019-05-18 ENCOUNTER — Telehealth: Payer: Self-pay

## 2019-05-18 DIAGNOSIS — Z0000 Encounter for general adult medical examination without abnormal findings: Secondary | ICD-10-CM

## 2019-05-18 MED ORDER — VALSARTAN-HYDROCHLOROTHIAZIDE 320-12.5 MG PO TABS: 1.0000 | ORAL_TABLET | Freq: Every day | ORAL | 3 refills | Status: DC

## 2019-05-18 MED ORDER — ATORVASTATIN CALCIUM 10 MG PO TABS: 10.0000 mg | ORAL_TABLET | Freq: Every day | ORAL | 3 refills | Status: DC

## 2019-05-18 MED ORDER — ESZOPICLONE 2 MG PO TABS: 2.0000 mg | ORAL_TABLET | Freq: Every evening | ORAL | 1 refills | Status: DC | PRN

## 2019-05-18 MED ORDER — BUPROPION HCL ER (XL) 150 MG PO TB24: 150.0000 mg | ORAL_TABLET | Freq: Every day | ORAL | 3 refills | Status: DC

## 2019-05-18 NOTE — Progress Notes (Signed)
I reviewed health advisor's note, was available for consultation, and agree with documentation and plan.  

## 2019-05-18 NOTE — Patient Instructions (Signed)
Devin Garza , Thank you for taking time to come for your Medicare Wellness Visit. I appreciate your ongoing commitment to your health goals. Please review the following plan we discussed and let me know if I can assist you in the future.   These are the goals we discussed: Goals    . Increase physical activity     Starting 05/18/19, I will continue to walk 20-30 minutes 5 days per week.       This is a list of the screening recommended for you and due dates:  Health Maintenance  Topic Date Due  . Flu Shot  07/28/2019  . Tetanus Vaccine  08/18/2027  . Pneumonia vaccines  Completed   Preventive Care for Adults  A healthy lifestyle and preventive care can promote health and wellness. Preventive health guidelines for adults include the following key practices.  . A routine yearly physical is a good way to check with your health care provider about your health and preventive screening. It is a chance to share any concerns and updates on your health and to receive a thorough exam.  . Visit your dentist for a routine exam and preventive care every 6 months. Brush your teeth twice a day and floss once a day. Good oral hygiene prevents tooth decay and gum disease.  . The frequency of eye exams is based on your age, health, family medical history, use  of contact lenses, and other factors. Follow your health care provider's recommendations for frequency of eye exams.  . Eat a healthy diet. Foods like vegetables, fruits, whole grains, low-fat dairy products, and lean protein foods contain the nutrients you need without too many calories. Decrease your intake of foods high in solid fats, added sugars, and salt. Eat the right amount of calories for you. Get information about a proper diet from your health care provider, if necessary.  . Regular physical exercise is one of the most important things you can do for your health. Most adults should get at least 150 minutes of moderate-intensity exercise  (any activity that increases your heart rate and causes you to sweat) each week. In addition, most adults need muscle-strengthening exercises on 2 or more days a week.  Silver Sneakers may be a benefit available to you. To determine eligibility, you may visit the website: www.silversneakers.com or contact program at (323) 240-2833 Mon-Fri between 8AM-8PM.   . Maintain a healthy weight. The body mass index (BMI) is a screening tool to identify possible weight problems. It provides an estimate of body fat based on height and weight. Your health care provider can find your BMI and can help you achieve or maintain a healthy weight.   For adults 20 years and older: ? A BMI below 18.5 is considered underweight. ? A BMI of 18.5 to 24.9 is normal. ? A BMI of 25 to 29.9 is considered overweight. ? A BMI of 30 and above is considered obese.   . Maintain normal blood lipids and cholesterol levels by exercising and minimizing your intake of saturated fat. Eat a balanced diet with plenty of fruit and vegetables. Blood tests for lipids and cholesterol should begin at age 25 and be repeated every 5 years. If your lipid or cholesterol levels are high, you are over 50, or you are at high risk for heart disease, you may need your cholesterol levels checked more frequently. Ongoing high lipid and cholesterol levels should be treated with medicines if diet and exercise are not working.  . If  you smoke, find out from your health care provider how to quit. If you do not use tobacco, please do not start.  . If you choose to drink alcohol, please do not consume more than 2 drinks per day. One drink is considered to be 12 ounces (355 mL) of beer, 5 ounces (148 mL) of wine, or 1.5 ounces (44 mL) of liquor.  . If you are 30-32 years old, ask your health care provider if you should take aspirin to prevent strokes.  . Use sunscreen. Apply sunscreen liberally and repeatedly throughout the day. You should seek shade when your  shadow is shorter than you. Protect yourself by wearing long sleeves, pants, a wide-brimmed hat, and sunglasses year round, whenever you are outdoors.  . Once a month, do a whole body skin exam, using a mirror to look at the skin on your back. Tell your health care provider of new moles, moles that have irregular borders, moles that are larger than a pencil eraser, or moles that have changed in shape or color.

## 2019-05-18 NOTE — Progress Notes (Signed)
Subjective:   Devin Garza is a 78 y.o. male who presents for Medicare Annual/Subsequent preventive examination.  Review of Systems:  N/A Cardiac Risk Factors include: advanced age (>69men, >10 women);male gender     Objective:    Vitals: There were no vitals taken for this visit.  There is no height or weight on file to calculate BMI.  Advanced Directives 05/18/2019 05/03/2018 09/02/2017 10/27/2016 07/23/2015  Does Patient Have a Medical Advance Directive? Yes Yes Yes No No  Type of Paramedic of [place];Living will [place];Living will [place];Living will - -  Does patient want to make changes to medical advance directive? No - Patient declined - - - -  Copy of [place] in Chart? No - copy requested No - copy requested No - copy requested - -  Would patient like information on creating a medical advance directive? - - - No - patient declined information -    Tobacco Social History   Tobacco Use  Smoking Status Current Every Day Smoker  . Packs/day: 0.50  . Years: 40.00  . Pack years: 20.00  . Types: Cigarettes  Smokeless Tobacco Never Used  Tobacco Comment   cigars occassionally     Ready to quit: No Counseling given: No Comment: cigars occassionally   Clinical Intake:  Pre-visit preparation completed: Yes  Pain : No/denies pain Pain Score: 0-No pain     Nutritional Status: BMI 25 -29 Overweight Nutritional Risks: None Diabetes: No  How often do you need to have someone help you when you read instructions, pamphlets, or other written materials from your doctor or pharmacy?: 1 - Never What is the last grade level you completed in school?: Bachelor degree  Interpreter Needed?: No  Comments: pt lives independently Information entered by :: LPinson, LPN  Past Medical History:  Diagnosis Date  . Anemia   . Basal cell adenocarcinoma 09/03/2008   Qualifier: Diagnosis of  By:  Lorelei Pont MD, Frederico Hamman    Overview:  Overview:  Qualifier: Diagnosis of  By: Lorelei Pont MD, Frederico Hamman   . Basal cell carcinoma of lip 2007  . ED (erectile dysfunction)   . GERD (gastroesophageal reflux disease)   . HLD (hyperlipidemia)   . Hypertension   . Osteoarthritis   . Peripheral vascular disease ([place])    Occlusion and Stenosis of Carotid Artery  . Tobacco abuse   . Tubular adenoma of colon 2005  . Wears dentures    full upper and lower   Past Surgical History:  Procedure Laterality Date  . ANKLE ARTHROSCOPY W/ OPEN REPAIR    . COLONOSCOPY  2006   [place]-Tubular adenoma  . COLONOSCOPY N/A 07/23/2015   Procedure: COLONOSCOPY;  Surgeon: Lucilla Lame, MD;  Location: [facility];  Service: Gastroenterology;  Laterality: N/A;  WITH BIOPSIES---- TRANSVERSE COLON POLYP DESCENDING COLON POLYP  X  4  . ESOPHAGOGASTRODUODENOSCOPY N/A 07/23/2015   Procedure: ESOPHAGOGASTRODUODENOSCOPY (EGD);  Surgeon: Lucilla Lame, MD;  Location: [facility];  Service: Gastroenterology;  Laterality: N/A;  WITH BIOPSY--- DUODENAL BIOPSY GASTRIC BIOPSY  . ESOPHAGOGASTRODUODENOSCOPY N/A 10/28/2016   Procedure: ESOPHAGOGASTRODUODENOSCOPY (EGD);  Surgeon: Manya Silvas, MD;  Location[facility] ENDOSCOPY;  Service: Endoscopy;  Laterality: N/A;  . ESOPHAGOGASTRODUODENOSCOPY (EGD) WITH PROPOFOL N/A 09/02/2017   Procedure: ESOPHAGOGASTRODUODENOSCOPY (EGD) WITH PROPOFOL;  Surgeon: Manya Silvas, MD;  Location: [facility] ENDOSCOPY;  Service: Endoscopy;  Laterality: N/A;  . SKIN CANCER EXCISION     LIP  .  UPPER GI ENDOSCOPY  12/2009   Dr. Ivory Broad gastritisl negative for H. pylori; duodenal biopsies neg for sprue   Family History  Problem Relation Age of Onset  . Cancer Mother 80       LUNG  . Cancer Father        THROAT  . Hearing loss Father        HEART ATTACK, CAD  . Cancer Sister        OVARIAN   Social History   Socioeconomic History  . Marital status: Single    Spouse name: Not on file  .  Number of children: 2  . Years of education: Not on file  . Highest education level: Not on file  Occupational History  . Occupation: retired Building surveyor: RETIRED  Social Needs  . Financial resource strain: Not on file  . Food insecurity:    Worry: Not on file    Inability: Not on file  . Transportation needs:    Medical: Not on file    Non-medical: Not on file  Tobacco Use  . Smoking status: Current Every Day Smoker    Packs/day: 0.50    Years: 40.00    Pack years: 20.00    Types: Cigarettes  . Smokeless tobacco: Never Used  . Tobacco comment: cigars occassionally  Substance and Sexual Activity  . Alcohol use: Yes    Alcohol/week: 14.0 standard drinks    Types: 14 Glasses of wine per week    Comment: 2 glasses of wine daily  . Drug use: No  . Sexual activity: Not Currently  Lifestyle  . Physical activity:    Days per week: Not on file    Minutes per session: Not on file  . Stress: Not on file  Relationships  . Social connections:    Talks on phone: Not on file    Gets together: Not on file    Attends religious service: Not on file    Active member of club or organization: Not on file    Attends meetings of clubs or organizations: Not on file    Relationship status: Not on file  Other Topics Concern  . Not on file  Social History Narrative   Lives alone    Outpatient Encounter Medications as of 05/18/2019  Medication Sig  . atorvastatin (LIPITOR) 10 MG tablet Take 1 tablet (10 mg total) by mouth daily.  Marland Kitchen buPROPion (WELLBUTRIN XL) 150 MG 24 hr tablet Take 1 tablet (150 mg total) by mouth daily.  . Colchicine (MITIGARE) 0.6 MG CAPS Take 1 tablet by mouth 2 (two) times daily as needed.  Marland Kitchen DIOVAN HCT 320-12.5 MG tablet TAKE 1 TABLET DAILY  . esomeprazole (NEXIUM) 20 MG capsule Take 40 mg by mouth 2 (two) times daily before a meal.  . eszopiclone (LUNESTA) 2 MG TABS tablet Take 1 tablet (2 mg total) by mouth at bedtime as needed for sleep. Take  immediately before bedtime  . loratadine (CLARITIN) 10 MG tablet Take 10 mg by mouth daily as needed for allergies.  . Multiple Vitamin (MULTIVITAMIN) tablet Take 1 tablet by mouth daily.  . [DISCONTINUED] UNABLE TO FIND Take by mouth.   No facility-administered encounter medications on file as of 05/18/2019.     Activities of Daily Living In your present state of health, do you have any difficulty performing the following activities: 05/18/2019  Hearing? N  Vision? N  Difficulty concentrating or making decisions? N  Walking or climbing stairs?  N  Dressing or bathing? N  Doing errands, shopping? N  Preparing Food and eating ? N  Using the Toilet? N  In the past six months, have you accidently leaked urine? N  Do you have problems with loss of bowel control? N  Managing your Medications? N  Managing your Finances? N  Housekeeping or managing your Housekeeping? N  Some recent data might be hidden    Patient Care Team: Owens Loffler, MD as PCP - General   Assessment:   This is a routine wellness examination for Destyn.  Vision Screening Comments: Vision exam in May 2019 with Dr. Maryruth Hancock B.   Exercise Activities and Dietary recommendations Current Exercise Habits: Home exercise routine, Type of exercise: walking, Time (Minutes): 30, Frequency (Times/Week): 5, Weekly Exercise (Minutes/Week): 150, Intensity: Mild, Exercise limited by: None identified  Goals    . Increase physical activity     Starting 05/18/19, I will continue to walk 20-30 minutes 5 days per week.        Fall Risk Fall Risk  05/18/2019 05/03/2018 04/27/2017 04/26/2016 04/23/2015  Falls in the past year? 0 No No No No   Depression Screen PHQ 2/9 Scores 05/18/2019 05/03/2018 04/27/2017 04/26/2016  PHQ - 2 Score 0 0 0 0  PHQ- 9 Score 0 0 - -    Cognitive Function MMSE - Mini Mental State Exam 05/18/2019 05/03/2018  Orientation to time 5 5  Orientation to Place 5 5  Registration 3 3  Attention/ Calculation 0 0  Recall 2 0   Recall-comments unable to recall 1 of 3 words unable to recall 3 of 3 words  Language- name 2 objects 0 0  Language- repeat 1 1  Language- follow 3 step command 0 3  Language- read & follow direction 0 0  Write a sentence 0 0  Copy design 0 0  Total score 16 17     PLEASE NOTE: A Mini-Cog screen was completed. Maximum score is 17. A value of 0 denotes this part of [place] MMSE was not completed or the patient failed this part of the Mini-Cog screening.   Mini-Cog Screening Orientation to Time - Max 5 pts Orientation to Place - Max 5 pts Registration - Max 3 pts Recall - Max 3 pts Language Repeat - Max 1 pts      Immunization History  Administered Date(s) Administered  . Influenza Split 09/10/2012, 09/24/2013  . Influenza, High Dose Seasonal PF 09/26/2016, 09/26/2017, 10/16/2018  . Pneumococcal Conjugate-13 04/23/2015  . Pneumococcal Polysaccharide-23 01/21/2011  . Td 12/28/2003  . Tdap 08/17/2017  . Zoster 10/29/2013  . Zoster Recombinat (Shingrix) 02/17/2019    Screening Tests Health Maintenance  Topic Date Due  . INFLUENZA VACCINE  07/28/2019  . TETANUS/TDAP  08/18/2027  . PNA vac Low Risk Adult  Completed       Plan:  I have personally reviewed, addressed, and noted the following in the patient's chart:  A. Medical and social history B. Use of alcohol, tobacco or illicit drugs  C. Current medications and supplements D. Functional ability and status E.  Nutritional status F.  Physical activity G. Advance directives H. List of other physicians I.  Hospitalizations, surgeries, and ER visits in previous 12 months J.  Vitals (unless it is a telemedicine encounter) K. Screenings to include cognitive, depression, hearing, vision (NOTE: hearing and vision screenings not completed in telemedicine encounter) L. Referrals and appointments   In addition, I have reviewed and discussed with patient certain preventive protocols,  quality metrics, and best practice  recommendations. A written personalized care plan for preventive services and recommendations were provided to patient.  Interactive audio and video telecommunications were attempted with patient. This attempt was unsuccessful due to patient having technical difficulties OR patient did not have access to video capability.  Encounter was completed with audio only.  Two patient identifiers were used to ensure the encounter occurred with the correct person.   Patient was in home and writer was in office.   Signed,   Lindell Noe, MHA, BS, LPN Health Coach

## 2019-05-18 NOTE — Progress Notes (Signed)
PCP notes:   Health maintenance:  No gaps identified  Abnormal screenings:   Mini-Cog score: 16/17 MMSE - Mini Mental State Exam 05/18/2019 05/03/2018  Orientation to time 5 5  Orientation to Place 5 5  Registration 3 3  Attention/ Calculation 0 0  Recall 2 0  Recall-comments unable to recall 1 of 3 words unable to recall 3 of 3 words  Language- name 2 objects 0 0  Language- repeat 1 1  Language- follow 3 step command 0 3  Language- read & follow direction 0 0  Write a sentence 0 0  Copy design 0 0  Total score 16 17    Patient concerns:   None  Nurse concerns:  None  Next PCP appt:   7/20 @ 1020

## 2019-05-18 NOTE — Telephone Encounter (Signed)
During AWV, patient requested the following refills be sent to [facility].  Lipitor Diovan Wellbutrin Lunesta

## 2019-05-23 ENCOUNTER — Encounter: Payer: Medicare Other | Admitting: Family Medicine

## 2019-07-09 ENCOUNTER — Other Ambulatory Visit: Payer: Self-pay

## 2019-07-09 ENCOUNTER — Other Ambulatory Visit (INDEPENDENT_AMBULATORY_CARE_PROVIDER_SITE_OTHER): Payer: Medicare Other

## 2019-07-09 DIAGNOSIS — Z125 Encounter for screening for malignant neoplasm of prostate: Secondary | ICD-10-CM | POA: Diagnosis not present

## 2019-07-09 DIAGNOSIS — Z79899 Other long term (current) drug therapy: Secondary | ICD-10-CM | POA: Diagnosis not present

## 2019-07-09 DIAGNOSIS — E7800 Pure hypercholesterolemia, unspecified: Secondary | ICD-10-CM

## 2019-07-09 DIAGNOSIS — R7303 Prediabetes: Secondary | ICD-10-CM | POA: Diagnosis not present

## 2019-07-09 LAB — HEPATIC FUNCTION PANEL
ALT: 20 U/L (ref 0–53)
AST: 25 U/L (ref 0–37)
Albumin: 4.3 g/dL (ref 3.5–5.2)
Alkaline Phosphatase: 56 U/L (ref 39–117)
Bilirubin, Direct: 0.1 mg/dL (ref 0.0–0.3)
Total Bilirubin: 0.4 mg/dL (ref 0.2–1.2)
Total Protein: 6.9 g/dL (ref 6.0–8.3)

## 2019-07-09 LAB — CBC WITH DIFFERENTIAL/PLATELET
Basophils Absolute: 0.1 10*3/uL (ref 0.0–0.1)
Basophils Relative: 1.2 % (ref 0.0–3.0)
Eosinophils Absolute: 0.3 10*3/uL (ref 0.0–0.7)
Eosinophils Relative: 5.5 % — ABNORMAL HIGH (ref 0.0–5.0)
HCT: 34.4 % — ABNORMAL LOW (ref 39.0–52.0)
Hemoglobin: 11.2 g/dL — ABNORMAL LOW (ref 13.0–17.0)
Lymphocytes Relative: 24.8 % (ref 12.0–46.0)
Lymphs Abs: 1.4 10*3/uL (ref 0.7–4.0)
MCHC: 32.5 g/dL (ref 30.0–36.0)
MCV: 85.5 fl (ref 78.0–100.0)
Monocytes Absolute: 1 10*3/uL (ref 0.1–1.0)
Monocytes Relative: 16.4 % — ABNORMAL HIGH (ref 3.0–12.0)
Neutro Abs: 3 10*3/uL (ref 1.4–7.7)
Neutrophils Relative %: 52.1 % (ref 43.0–77.0)
Platelets: 273 10*3/uL (ref 150.0–400.0)
RBC: 4.03 Mil/uL — ABNORMAL LOW (ref 4.22–5.81)
RDW: 15.5 % (ref 11.5–15.5)
WBC: 5.8 10*3/uL (ref 4.0–10.5)

## 2019-07-09 LAB — LIPID PANEL
Cholesterol: 159 mg/dL (ref 0–200)
HDL: 74.4 mg/dL (ref >39.00)
LDL Cholesterol: 74 mg/dL (ref 0–99)
NonHDL: 84.91
Total CHOL/HDL Ratio: 2
Triglycerides: 54 mg/dL (ref 0.0–149.0)
VLDL: 10.8 mg/dL (ref 0.0–40.0)

## 2019-07-09 LAB — BASIC METABOLIC PANEL
BUN: 27 mg/dL — ABNORMAL HIGH (ref 6–23)
CO2: 23 mEq/L (ref 19–32)
Calcium: 7.2 mg/dL — ABNORMAL LOW (ref 8.4–10.5)
Chloride: 104 mEq/L (ref 96–112)
Creatinine, Ser: 1.33 mg/dL (ref 0.40–1.50)
GFR: 51.89 mL/min — ABNORMAL LOW (ref >60.00)
Glucose, Bld: 128 mg/dL — ABNORMAL HIGH (ref 70–99)
Potassium: 4 mEq/L (ref 3.5–5.1)
Sodium: 140 mEq/L (ref 135–145)

## 2019-07-09 LAB — HEMOGLOBIN A1C: Hgb A1c MFr Bld: 6.2 % (ref 4.6–6.5)

## 2019-07-10 LAB — PSA, TOTAL WITH REFLEX TO PSA, FREE: PSA, Total: 1.5 ng/mL (ref <=4.0)

## 2019-07-16 ENCOUNTER — Ambulatory Visit (INDEPENDENT_AMBULATORY_CARE_PROVIDER_SITE_OTHER): Payer: Medicare Other | Admitting: Family Medicine

## 2019-07-16 ENCOUNTER — Other Ambulatory Visit: Payer: Self-pay

## 2019-07-16 ENCOUNTER — Encounter: Payer: Self-pay | Admitting: Family Medicine

## 2019-07-16 VITALS — BP 130/84 | HR 84 | Temp 98.3°F | Ht 68.25 in | Wt 184.0 lb

## 2019-07-16 DIAGNOSIS — I10 Essential (primary) hypertension: Secondary | ICD-10-CM | POA: Diagnosis not present

## 2019-07-16 DIAGNOSIS — E7800 Pure hypercholesterolemia, unspecified: Secondary | ICD-10-CM | POA: Diagnosis not present

## 2019-07-16 DIAGNOSIS — D508 Other iron deficiency anemias: Secondary | ICD-10-CM

## 2019-07-16 DIAGNOSIS — R7303 Prediabetes: Secondary | ICD-10-CM | POA: Diagnosis not present

## 2019-07-16 DIAGNOSIS — Z0000 Encounter for general adult medical examination without abnormal findings: Secondary | ICD-10-CM

## 2019-07-16 MED ORDER — DICLOFENAC SODIUM 1.5 % TD SOLN: TRANSDERMAL | 5 refills | Status: DC

## 2019-07-16 NOTE — Patient Instructions (Signed)
Get some iron sulfate 325 mg one tablet a day

## 2019-07-16 NOTE — Progress Notes (Signed)
Adeliz Tonkinson T. Caedan Sumler, MD Primary Care and [place] at [facility] [place], [address] Phone: [phone]  FAX: [place] - 78 y.o. male  MRN [id number]  Date of Birth: August 09, 1941  Visit Date: 07/16/2019  PCP: Owens Loffler, MD  Referred by: Owens Loffler, MD  Chief Complaint  Patient presents with  . Annual Exam   Patient Care Team: Owens Loffler, MD as PCP - General Subjective:   Devin Garza is a 78 y.o. pleasant patient who presents with the following:  Preventative Health Maintenance Visit:  Health Maintenance Summary Reviewed and updated, unless pt declines services.  Tobacco History Reviewed. Alcohol: about 1 drink a day - was higher earlier in the year Exercise Habits: Some activity, rec at least 30 mins 5 times a week STD concerns: no risk or activity to increase risk Drug Use: None Encouraged self-testicular check  F/u Carotids They   Doing pretty well  Health Maintenance  Topic Date Due  . INFLUENZA VACCINE  07/28/2019  . TETANUS/TDAP  08/18/2027  . PNA vac Low Risk Adult  Completed   Immunization History  Administered Date(s) Administered  . Influenza Split 09/10/2012, 09/24/2013  . Influenza, High Dose Seasonal PF 09/26/2016, 09/26/2017, 10/16/2018  . Pneumococcal Conjugate-13 04/23/2015  . Pneumococcal Polysaccharide-23 01/21/2011  . Td 12/28/2003  . Tdap 08/17/2017  . Zoster 10/29/2013  . Zoster Recombinat (Shingrix) 02/17/2019   Patient Active Problem List   Diagnosis Date Noted  . Gout 03/21/2017  . History of upper gastrointestinal bleeding 11/10/2016  . Angiodysplasia of stomach and duodenum without hemorrhage   . Benign neoplasm of transverse colon   . Benign neoplasm of descending colon   . Esophageal reflux 05/28/2015  . Tubular adenoma of colon 05/28/2015  . Prediabetes 04/21/2011  . Alcohol abuse 01/21/2011  . Anemia, unspecified  11/12/2009  . Basal cell carcinoma of skin 09/03/2008  . Occlusion and stenosis of carotid artery 08/28/2008  . Insomnia 08/28/2008  . Hyperlipidemia 08/27/2008  . Essential hypertension 08/27/2008   Past Medical History:  Diagnosis Date  . Anemia   . Basal cell adenocarcinoma 09/03/2008   Qualifier: Diagnosis of  By: Lorelei Pont MD, Frederico Hamman    Overview:  Overview:  Qualifier: Diagnosis of  By: Lorelei Pont MD, Frederico Hamman   . Basal cell carcinoma of lip 2007   multiple sites  . ED (erectile dysfunction)   . GERD (gastroesophageal reflux disease)   . HLD (hyperlipidemia)   . Hx of squamous cell carcinoma of skin 08/25/2016   L superior pectoral  . Hypertension   . Osteoarthritis   . Peripheral vascular disease ([place])    Occlusion and Stenosis of Carotid Artery  . Tobacco abuse   . Tubular adenoma of colon 2005  . Wears dentures    full upper and lower   Past Surgical History:  Procedure Laterality Date  . ANKLE ARTHROSCOPY W/ OPEN REPAIR    . COLONOSCOPY  2006   [place]-Tubular adenoma  . COLONOSCOPY N/A 07/23/2015   Procedure: COLONOSCOPY;  Surgeon: Lucilla Lame, MD;  Location: [place];  Service: Gastroenterology;  Laterality: N/A;  WITH BIOPSIES---- TRANSVERSE COLON POLYP DESCENDING COLON POLYP  X  4  . ESOPHAGOGASTRODUODENOSCOPY N/A 07/23/2015   Procedure: ESOPHAGOGASTRODUODENOSCOPY (EGD);  Surgeon: Lucilla Lame, MD;  Location: [place];  Service: Gastroenterology;  Laterality: N/A;  WITH BIOPSY--- DUODENAL BIOPSY GASTRIC BIOPSY  . ESOPHAGOGASTRODUODENOSCOPY N/A 10/28/2016   Procedure: ESOPHAGOGASTRODUODENOSCOPY (EGD);  Surgeon: Manya Silvas, MD;  Location: [facility] ENDOSCOPY;  Service: Endoscopy;  Laterality: N/A;  . ESOPHAGOGASTRODUODENOSCOPY (EGD) WITH PROPOFOL N/A 09/02/2017   Procedure: ESOPHAGOGASTRODUODENOSCOPY (EGD) WITH PROPOFOL;  Surgeon: Manya Silvas, MD;  Location: [facility] ENDOSCOPY;  Service: Endoscopy;  Laterality: N/A;  . SKIN CANCER EXCISION     LIP   . UPPER GI ENDOSCOPY  12/2009   Dr. Ivory Broad gastritisl negative for H. pylori; duodenal biopsies neg for sprue   Social History   Socioeconomic History  . Marital status: Single    Spouse name: Not on file  . Number of children: 2  . Years of education: Not on file  . Highest education level: Not on file  Occupational History  . Occupation: retired Building surveyor: RETIRED  Social Needs  . Financial resource strain: Not on file  . Food insecurity    Worry: Not on file    Inability: Not on file  . Transportation needs    Medical: Not on file    Non-medical: Not on file  Tobacco Use  . Smoking status: Current Every Day Smoker    Packs/day: 0.50    Years: 40.00    Pack years: 20.00    Types: Cigarettes  . Smokeless tobacco: Never Used  . Tobacco comment: cigars occassionally  Substance and Sexual Activity  . Alcohol use: Yes    Alcohol/week: 14.0 standard drinks    Types: 14 Glasses of wine per week    Comment: 2 glasses of wine daily  . Drug use: No  . Sexual activity: Not Currently  Lifestyle  . Physical activity    Days per week: Not on file    Minutes per session: Not on file  . Stress: Not on file  Relationships  . Social Herbalist on phone: Not on file    Gets together: Not on file    Attends religious service: Not on file    Active member of club or organization: Not on file    Attends meetings of clubs or organizations: Not on file    Relationship status: Not on file  . Intimate partner violence    Fear of current or ex partner: Not on file    Emotionally abused: Not on file    Physically abused: Not on file    Forced sexual activity: Not on file  Other Topics Concern  . Not on file  Social History Narrative   Lives alone   Family History  Problem Relation Age of Onset  . Cancer Mother 63       LUNG  . Cancer Father        THROAT  . Hearing loss Father        HEART ATTACK, CAD  . Cancer Sister        OVARIAN    No Known Allergies  Medication list has been reviewed and updated.   General: Denies fever, chills, sweats. No significant weight loss. Eyes: Denies blurring,significant itching ENT: Denies earache, sore throat, and hoarseness. Cardiovascular: Denies chest pains, palpitations, dyspnea on exertion Respiratory: Denies cough, dyspnea at rest,wheeezing Breast: no concerns about lumps GI: Denies nausea, vomiting, diarrhea, constipation, change in bowel habits, abdominal pain, melena, hematochezia GU: Denies penile discharge, ED, urinary flow / outflow problems. No STD concerns. Musculoskeletal: Denies back pain, joint pain Derm: Denies rash, itching Neuro: Denies  paresthesias, frequent falls, frequent headaches Psych: Denies depression, anxiety Endocrine: Denies cold intolerance, heat intolerance, polydipsia Heme: Denies  enlarged lymph nodes Allergy: No hayfever  Objective:   BP 130/84   Pulse 84   Temp 98.3 F (36.8 C) (Skin)   Ht 5' 8.25" (1.734 m)   Wt 184 lb (83.5 kg)   SpO2 98%   BMI 27.77 kg/m  Ideal Body Weight: Weight in (lb) to have BMI = 25: 165.3  Ideal Body Weight: Weight in (lb) to have BMI = 25: 165.3 No exam data present Depression screen [facility] 2/9 05/18/2019 05/03/2018 04/27/2017 04/26/2016 04/23/2015  Decreased Interest 0 0 0 0 0  Down, Depressed, Hopeless 0 0 0 0 0  PHQ - 2 Score 0 0 0 0 0  Altered sleeping 0 0 - - -  Tired, decreased energy 0 0 - - -  Change in appetite 0 0 - - -  Feeling bad or failure about yourself  0 0 - - -  Trouble concentrating 0 0 - - -  Moving slowly or fidgety/restless 0 0 - - -  Suicidal thoughts 0 0 - - -  PHQ-9 Score 0 0 - - -  Difficult doing work/chores Not difficult at all Not difficult at all - - -     GEN: well developed, well nourished, no acute distress Eyes: conjunctiva and lids normal, PERRLA, EOMI ENT: TM clear, nares clear, oral exam WNL Neck: supple, no lymphadenopathy, no thyromegaly, no JVD Pulm: clear to  auscultation and percussion, respiratory effort normal CV: regular rate and rhythm, S1-S2, no murmur, rub or gallop, no bruits, peripheral pulses normal and symmetric, no cyanosis, clubbing, edema or varicosities GI: soft, non-tender; no hepatosplenomegaly, masses; active bowel sounds all quadrants GU: no hernia, testicular mass, penile discharge Lymph: no cervical, axillary or inguinal adenopathy MSK: gait normal, muscle tone and strength WNL, no joint swelling, effusions, discoloration, crepitus  SKIN: clear, good turgor, color WNL, no rashes, lesions, or ulcerations Neuro: normal mental status, normal strength, sensation, and motion Psych: alert; oriented to person, place and time, normally interactive and not anxious or depressed in appearance. All labs reviewed with patient. Results for orders placed or performed in visit on 07/09/19  Lipid panel  Result Value Ref Range   Cholesterol 159 0 - 200 mg/dL   Triglycerides 54.0 0.0 - 149.0 mg/dL   HDL 74.40 >39.00 mg/dL   VLDL 10.8 0.0 - 40.0 mg/dL   LDL Cholesterol 74 0 - 99 mg/dL   Total CHOL/HDL Ratio 2    NonHDL 84.91   Hepatic function panel  Result Value Ref Range   Total Bilirubin 0.4 0.2 - 1.2 mg/dL   Bilirubin, Direct 0.1 0.0 - 0.3 mg/dL   Alkaline Phosphatase 56 39 - 117 U/L   AST 25 0 - 37 U/L   ALT 20 0 - 53 U/L   Total Protein 6.9 6.0 - 8.3 g/dL   Albumin 4.3 3.5 - 5.2 g/dL  Basic metabolic panel  Result Value Ref Range   Sodium 140 135 - 145 mEq/L   Potassium 4.0 3.5 - 5.1 mEq/L   Chloride 104 96 - 112 mEq/L   CO2 23 19 - 32 mEq/L   Glucose, Bld 128 (H) 70 - 99 mg/dL   BUN 27 (H) 6 - 23 mg/dL   Creatinine, Ser 1.33 0.40 - 1.50 mg/dL   Calcium 7.2 (L) 8.4 - 10.5 mg/dL   GFR 51.89 (L) >60.00 mL/min  CBC with Differential/Platelet  Result Value Ref Range   WBC 5.8 4.0 - 10.5 K/uL   RBC 4.03 (L) 4.22 - 5.81  Mil/uL   Hemoglobin 11.2 (L) 13.0 - 17.0 g/dL   HCT 34.4 (L) 39.0 - 52.0 %   MCV 85.5 78.0 - 100.0 fl    MCHC 32.5 30.0 - 36.0 g/dL   RDW 15.5 11.5 - 15.5 %   Platelets 273.0 150.0 - 400.0 K/uL   Neutrophils Relative % 52.1 43.0 - 77.0 %   Lymphocytes Relative 24.8 12.0 - 46.0 %   Monocytes Relative 16.4 (H) 3.0 - 12.0 %   Eosinophils Relative 5.5 (H) 0.0 - 5.0 %   Basophils Relative 1.2 0.0 - 3.0 %   Neutro Abs 3.0 1.4 - 7.7 K/uL   Lymphs Abs 1.4 0.7 - 4.0 K/uL   Monocytes Absolute 1.0 0.1 - 1.0 K/uL   Eosinophils Absolute 0.3 0.0 - 0.7 K/uL   Basophils Absolute 0.1 0.0 - 0.1 K/uL  Hemoglobin A1c  Result Value Ref Range   Hgb A1c MFr Bld 6.2 4.6 - 6.5 %  PSA, Total with Reflex to PSA, Free  Result Value Ref Range   PSA, Total 1.5 < OR = 4.0 ng/mL    Assessment and Plan:     ICD-10-CM   1. Healthcare maintenance  Z00.00   2. Prediabetes  R73.03   3. Essential hypertension  I10   4. Pure hypercholesterolemia  E78.00   5. Other iron deficiency anemia  D50.8 CBC with Differential/Platelet   Iron sulfate and recheck CBC in 2 mo. I recommended Gi involvement and colonoscopy, but he declined my medical recommendation and advice.   Health Maintenance Exam: The patient's preventative maintenance and recommended screening tests for an annual wellness exam were reviewed in full today. Brought up to date unless services declined.  Counselled on the importance of diet, exercise, and its role in overall health and mortality. The patient's FH and SH was reviewed, including their home life, tobacco status, and drug and alcohol status.  Follow-up in 1 year for physical exam or additional follow-up below.  Follow-up: No follow-ups on file. Or follow-up in 1 year if not noted.  Meds ordered this encounter  Medications  . Diclofenac Sodium 1.5 % SOLN    Sig: Apply to small joints up to 4 times a day as needed    Dispense:  450 mL    Refill:  5   Medications Discontinued During This Encounter  Medication Reason  . Diclofenac Sodium 1.5 % SOLN Reorder   Orders Placed This Encounter   Procedures  . CBC with Differential/Platelet    Signed,  Frederico Hamman T. Izyk Marty, MD   Allergies as of 07/16/2019   No Known Allergies     Medication List       Accurate as of July 16, 2019 10:51 AM. If you have any questions, ask your nurse or doctor.        atorvastatin 10 MG tablet Commonly known as: LIPITOR Take 1 tablet (10 mg total) by mouth daily.   buPROPion 150 MG 24 hr tablet Commonly known as: WELLBUTRIN XL Take 1 tablet (150 mg total) by mouth daily.   Colchicine 0.6 MG Caps Commonly known as: Mitigare Take 1 tablet by mouth 2 (two) times daily as needed.   Diclofenac Sodium 1.5 % Soln Apply to small joints up to 4 times a day as needed What changed:   how to take this  additional instructions Changed by: Owens Loffler, MD   esomeprazole 20 MG capsule Commonly known as: NEXIUM Take 40 mg by mouth 2 (two) times daily before a meal.  eszopiclone 2 MG Tabs tablet Commonly known as: LUNESTA Take 1 tablet (2 mg total) by mouth at bedtime as needed for sleep. Take immediately before bedtime   loratadine 10 MG tablet Commonly known as: CLARITIN Take 10 mg by mouth daily as needed for allergies.   multivitamin tablet Take 1 tablet by mouth daily.   valsartan-hydrochlorothiazide 320-12.5 MG tablet Commonly known as: Diovan HCT Take 1 tablet by mouth daily.

## 2019-07-23 ENCOUNTER — Ambulatory Visit (INDEPENDENT_AMBULATORY_CARE_PROVIDER_SITE_OTHER): Payer: Medicare Other | Admitting: Podiatry

## 2019-07-23 ENCOUNTER — Encounter: Payer: Self-pay | Admitting: Podiatry

## 2019-07-23 ENCOUNTER — Other Ambulatory Visit: Payer: Self-pay

## 2019-07-23 ENCOUNTER — Other Ambulatory Visit: Payer: Self-pay | Admitting: *Deleted

## 2019-07-23 VITALS — Temp 98.3°F

## 2019-07-23 DIAGNOSIS — B351 Tinea unguium: Secondary | ICD-10-CM | POA: Diagnosis not present

## 2019-07-23 DIAGNOSIS — M79609 Pain in unspecified limb: Secondary | ICD-10-CM

## 2019-07-23 DIAGNOSIS — L608 Other nail disorders: Secondary | ICD-10-CM | POA: Diagnosis not present

## 2019-07-23 DIAGNOSIS — M79676 Pain in unspecified toe(s): Secondary | ICD-10-CM

## 2019-07-23 MED ORDER — COLCHICINE 0.6 MG PO CAPS: 1.0000 | ORAL_CAPSULE | Freq: Two times a day (BID) | ORAL | 3 refills | Status: DC | PRN

## 2019-07-23 NOTE — Progress Notes (Signed)
Complaint:  Visit Type: Patient returns to my office for continued preventative foot care services. Complaint: Patient states" my nails have grown long and thick and become painful to walk and wear shoes"  The patient presents for preventative foot care services. No changes to ROS. Patient is experiencing a gout attack and is taking mitigare due to bleeding prolems.  Podiatric Exam: Vascular: dorsalis pedis and posterior tibial pulses are palpable bilateral. Capillary return is immediate. Temperature gradient is WNL. Skin turgor WNL  Sensorium: Normal Semmes Weinstein monofilament test. Normal tactile sensation bilaterally. Nail Exam: Pt has thick disfigured discolored nails with subungual debris noted bilateral entire nail hallux through fifth toenails.  Ingrown toenail medial border hallux  B/L. Dried blood medial border left hallux.  No infection. Ulcer Exam: There is no evidence of ulcer or pre-ulcerative changes or infection. Orthopedic Exam: Muscle tone and strength are WNL. No limitations in general ROM. No crepitus or effusions noted. HAV  B/L.  Hammer toe second right.  Red inflamed healing condition left forefoot dorsally.  Swelling resolving. Skin: No Porokeratosis. No infection or ulcers  Diagnosis:  Onychomycosis, , Pain in right toe, pain in left toes,  Pincer nails   Treatment & Plan Procedures and Treatment: Consent by patient was obtained for treatment procedures. The patient understood the discussion of treatment and procedures well. All questions were answered thoroughly reviewed. Debridement of mycotic and hypertrophic toenails, 1 through 5 bilateral and clearing of subungual debris. No ulceration, no infection noted.  Patient was told to recontact his medical doctor for new prescription. Return Visit-Office Procedure: Patient instructed to return to the office for a follow up visit 10 weeks  for continued evaluation and treatment.    Gardiner Barefoot DPM

## 2019-08-05 DIAGNOSIS — Z23 Encounter for immunization: Secondary | ICD-10-CM | POA: Diagnosis not present

## 2019-10-01 ENCOUNTER — Encounter: Payer: Self-pay | Admitting: Podiatry

## 2019-10-01 ENCOUNTER — Ambulatory Visit (INDEPENDENT_AMBULATORY_CARE_PROVIDER_SITE_OTHER): Payer: Medicare Other | Admitting: Podiatry

## 2019-10-01 ENCOUNTER — Other Ambulatory Visit: Payer: Self-pay

## 2019-10-01 DIAGNOSIS — B351 Tinea unguium: Secondary | ICD-10-CM | POA: Diagnosis not present

## 2019-10-01 DIAGNOSIS — L608 Other nail disorders: Secondary | ICD-10-CM

## 2019-10-01 DIAGNOSIS — M79609 Pain in unspecified limb: Secondary | ICD-10-CM

## 2019-10-01 DIAGNOSIS — M79676 Pain in unspecified toe(s): Secondary | ICD-10-CM

## 2019-10-01 NOTE — Progress Notes (Signed)
Complaint:  Visit Type: Patient returns to my office for continued preventative foot care services. Complaint: Patient states" my nails have grown long and thick and become painful to walk and wear shoes"  The patient presents for preventative foot care services. No changes to ROS.   Podiatric Exam: Vascular: dorsalis pedis and posterior tibial pulses are palpable bilateral. Capillary return is immediate. Temperature gradient is WNL. Skin turgor WNL  Sensorium: Normal Semmes Weinstein monofilament test. Normal tactile sensation bilaterally. Nail Exam: Pt has thick disfigured discolored nails with subungual debris noted bilateral entire nail hallux through fifth toenails.  Ingrown toenail medial border hallux  B/L.  Ulcer Exam: There is no evidence of ulcer or pre-ulcerative changes or infection. Orthopedic Exam: Muscle tone and strength are WNL. No limitations in general ROM. No crepitus or effusions noted. HAV  B/L.  Hammer toe second right.   Skin: No Porokeratosis. No infection or ulcers  Diagnosis:  Onychomycosis, , Pain in right toe, pain in left toes,  Pincer nails   Treatment & Plan Procedures and Treatment: Consent by patient was obtained for treatment procedures. The patient understood the discussion of treatment and procedures well. All questions were answered thoroughly reviewed. Debridement of mycotic and hypertrophic toenails, 1 through 5 bilateral and clearing of subungual debris. No ulceration, no infection noted.  Return Visit-Office Procedure: Patient instructed to return to the office for a follow up visit 10 weeks  for continued evaluation and treatment.    Gardiner Barefoot DPM

## 2019-10-15 ENCOUNTER — Telehealth: Payer: Self-pay | Admitting: Family Medicine

## 2019-10-15 NOTE — Telephone Encounter (Signed)
The patient called in regarding a bill he received for his Medicare annual wellness visit dos 07/16/2019. Patient stated this should be covered by Medicare. He spoke with Medicare and was told to make sure the claim was filed correctly. An e-mail has been sent to the coding department requesting a review of the claim.

## 2019-10-18 ENCOUNTER — Telehealth: Payer: Self-pay | Admitting: Family Medicine

## 2019-10-18 NOTE — Telephone Encounter (Signed)
Left a message on patient's voicemail to call the office. Need to let patient know that the claim was filed incorrectly and the bill will be removed. He will not be responsible for the charges.

## 2019-10-18 NOTE — Telephone Encounter (Signed)
Patient returned call.  He can be reached at [phone].

## 2019-10-18 NOTE — Telephone Encounter (Signed)
I spoke with the patient and made him aware of the information I received from the billing department.

## 2019-12-06 DIAGNOSIS — Z9841 Cataract extraction status, right eye: Secondary | ICD-10-CM | POA: Diagnosis not present

## 2019-12-06 DIAGNOSIS — H43821 Vitreomacular adhesion, right eye: Secondary | ICD-10-CM | POA: Diagnosis not present

## 2019-12-06 DIAGNOSIS — Z9842 Cataract extraction status, left eye: Secondary | ICD-10-CM | POA: Diagnosis not present

## 2019-12-06 DIAGNOSIS — H52223 Regular astigmatism, bilateral: Secondary | ICD-10-CM | POA: Diagnosis not present

## 2019-12-10 ENCOUNTER — Ambulatory Visit: Payer: Medicare Other | Admitting: Podiatry

## 2019-12-17 ENCOUNTER — Ambulatory Visit (INDEPENDENT_AMBULATORY_CARE_PROVIDER_SITE_OTHER): Payer: Medicare Other | Admitting: Podiatry

## 2019-12-17 ENCOUNTER — Encounter: Payer: Self-pay | Admitting: Podiatry

## 2019-12-17 ENCOUNTER — Other Ambulatory Visit: Payer: Self-pay

## 2019-12-17 DIAGNOSIS — B351 Tinea unguium: Secondary | ICD-10-CM | POA: Diagnosis not present

## 2019-12-17 DIAGNOSIS — L608 Other nail disorders: Secondary | ICD-10-CM

## 2019-12-17 DIAGNOSIS — L600 Ingrowing nail: Secondary | ICD-10-CM

## 2019-12-17 DIAGNOSIS — M79676 Pain in unspecified toe(s): Secondary | ICD-10-CM

## 2019-12-17 NOTE — Progress Notes (Signed)
Complaint:  Visit Type: Patient returns to my office for continued preventative foot care services. Complaint: Patient states" my nails have grown long and thick and become painful to walk and wear shoes"  The patient presents for preventative foot care services. No changes to ROS.   Podiatric Exam: Vascular: dorsalis pedis and posterior tibial pulses are palpable bilateral. Capillary return is immediate. Temperature gradient is WNL. Skin turgor WNL  Sensorium: Normal Semmes Weinstein monofilament test. Normal tactile sensation bilaterally. Nail Exam: Pt has thick disfigured discolored nails with subungual debris noted bilateral entire nail hallux through fifth toenails.  Ingrown toenail medial border hallux  B/L.  Ulcer Exam: There is no evidence of ulcer or pre-ulcerative changes or infection. Orthopedic Exam: Muscle tone and strength are WNL. No limitations in general ROM. No crepitus or effusions noted. HAV  B/L.  Hammer toe second right.   Skin: No Porokeratosis. No infection or ulcers  Diagnosis:  Onychomycosis, , Pain in right toe, pain in left toes,  Pincer nails   Treatment & Plan Procedures and Treatment: Consent by patient was obtained for treatment procedures. The patient understood the discussion of treatment and procedures well. All questions were answered thoroughly reviewed. Debridement of mycotic and hypertrophic toenails, 1 through 5 bilateral and clearing of subungual debris. No ulceration, no infection noted.  Return Visit-Office Procedure: Patient instructed to return to the office for a follow up visit 10 weeks  for continued evaluation and treatment.    Gardiner Barefoot DPM

## 2020-01-23 DIAGNOSIS — Z23 Encounter for immunization: Secondary | ICD-10-CM | POA: Diagnosis not present

## 2020-02-01 ENCOUNTER — Telehealth: Payer: Self-pay | Admitting: Family Medicine

## 2020-02-01 ENCOUNTER — Encounter: Payer: Self-pay | Admitting: Emergency Medicine

## 2020-02-01 ENCOUNTER — Emergency Department
Admission: EM | Admit: 2020-02-01 | Discharge: 2020-02-01 | Disposition: A | Discharge status: Home / Self Care | Payer: Medicare Other | Attending: Emergency Medicine | Admitting: Emergency Medicine

## 2020-02-01 ENCOUNTER — Other Ambulatory Visit: Payer: Self-pay

## 2020-02-01 DIAGNOSIS — Z79899 Other long term (current) drug therapy: Secondary | ICD-10-CM | POA: Insufficient documentation

## 2020-02-01 DIAGNOSIS — F17210 Nicotine dependence, cigarettes, uncomplicated: Secondary | ICD-10-CM | POA: Diagnosis not present

## 2020-02-01 DIAGNOSIS — I10 Essential (primary) hypertension: Secondary | ICD-10-CM | POA: Diagnosis not present

## 2020-02-01 DIAGNOSIS — Z85828 Personal history of other malignant neoplasm of skin: Secondary | ICD-10-CM | POA: Diagnosis not present

## 2020-02-01 DIAGNOSIS — R5383 Other fatigue: Secondary | ICD-10-CM | POA: Diagnosis present

## 2020-02-01 DIAGNOSIS — K922 Gastrointestinal hemorrhage, unspecified: Secondary | ICD-10-CM | POA: Insufficient documentation

## 2020-02-01 DIAGNOSIS — R195 Other fecal abnormalities: Secondary | ICD-10-CM

## 2020-02-01 LAB — COMPREHENSIVE METABOLIC PANEL WITH GFR
ALT: 38 U/L (ref 0–44)
AST: 50 U/L — ABNORMAL HIGH (ref 15–41)
Albumin: 4.2 g/dL (ref 3.5–5.0)
Alkaline Phosphatase: 53 U/L (ref 38–126)
Anion gap: 17 — ABNORMAL HIGH (ref 5–15)
BUN: 35 mg/dL — ABNORMAL HIGH (ref 8–23)
CO2: 20 mmol/L — ABNORMAL LOW (ref 22–32)
Calcium: 9.6 mg/dL (ref 8.9–10.3)
Chloride: 99 mmol/L (ref 98–111)
Creatinine, Ser: 1.19 mg/dL (ref 0.61–1.24)
GFR calc Af Amer: >60 mL/min
GFR calc non Af Amer: 58 mL/min — ABNORMAL LOW
Glucose, Bld: 152 mg/dL — ABNORMAL HIGH (ref 70–99)
Potassium: 3.8 mmol/L (ref 3.5–5.1)
Sodium: 136 mmol/L (ref 135–145)
Total Bilirubin: 1.3 mg/dL — ABNORMAL HIGH (ref 0.3–1.2)
Total Protein: 7.5 g/dL (ref 6.5–8.1)

## 2020-02-01 LAB — TYPE AND SCREEN
ABO/RH(D): A POS
Antibody Screen: NEGATIVE

## 2020-02-01 LAB — CBC
HCT: 32.4 % — ABNORMAL LOW (ref 39.0–52.0)
Hemoglobin: 11.1 g/dL — ABNORMAL LOW (ref 13.0–17.0)
MCH: 31.3 pg (ref 26.0–34.0)
MCHC: 34.3 g/dL (ref 30.0–36.0)
MCV: 91.3 fL (ref 80.0–100.0)
Platelets: 277 10*3/uL (ref 150–400)
RBC: 3.55 MIL/uL — ABNORMAL LOW (ref 4.22–5.81)
RDW: 12.6 % (ref 11.5–15.5)
WBC: 7.5 10*3/uL (ref 4.0–10.5)
nRBC: 0 % (ref 0.0–0.2)

## 2020-02-01 NOTE — Discharge Instructions (Signed)
Results for orders placed or performed during the hospital encounter of 02/01/20  Comprehensive metabolic panel  Result Value Ref Range   Sodium 136 135 - 145 mmol/L   Potassium 3.8 3.5 - 5.1 mmol/L   Chloride 99 98 - 111 mmol/L   CO2 20 (L) 22 - 32 mmol/L   Glucose, Bld 152 (H) 70 - 99 mg/dL   BUN 35 (H) 8 - 23 mg/dL   Creatinine, Ser 1.19 0.61 - 1.24 mg/dL   Calcium 9.6 8.9 - 10.3 mg/dL   Total Protein 7.5 6.5 - 8.1 g/dL   Albumin 4.2 3.5 - 5.0 g/dL   AST 50 (H) 15 - 41 U/L   ALT 38 0 - 44 U/L   Alkaline Phosphatase 53 38 - 126 U/L   Total Bilirubin 1.3 (H) 0.3 - 1.2 mg/dL   GFR calc non Af Amer 58 (L) >60 mL/min   GFR calc Af Amer >60 >60 mL/min   Anion gap 17 (H) 5 - 15  CBC  Result Value Ref Range   WBC 7.5 4.0 - 10.5 K/uL   RBC 3.55 (L) 4.22 - 5.81 MIL/uL   Hemoglobin 11.1 (L) 13.0 - 17.0 g/dL   HCT 32.4 (L) 39.0 - 52.0 %   MCV 91.3 80.0 - 100.0 fL   MCH 31.3 26.0 - 34.0 pg   MCHC 34.3 30.0 - 36.0 g/dL   RDW 12.6 11.5 - 15.5 %   Platelets 277 150 - 400 K/uL   nRBC 0.0 0.0 - 0.2 %  Type and screen [facility]  Result Value Ref Range   ABO/RH(D) A POS    Antibody Screen NEG    Sample Expiration      02/04/2020,2359 Performed at [facility], [address] [place], [place] [address]    No results found.

## 2020-02-01 NOTE — ED Provider Notes (Signed)
[facility] Emergency Department Provider Note  ____________________________________________  Time seen: Approximately 1:38 PM  I have reviewed the triage vital signs and the nursing notes.   HISTORY  Chief Complaint Weakness and Rectal Bleeding    HPI Devin Garza is a 79 y.o. male with a history of GERD, hyperlipidemia, hypertension, gout who comes the ED complaining of fatigue for last few days and dark stool.  He reports a history of GI bleed.  He strictly avoids NSAIDs, steroids, and does not take any blood thinners.  He takes Nexium double dose twice daily and has been compliant with this regimen.  No changes in his diet.  Denies dizziness or syncope, no chest pain shortness of breath body aches fevers or chills.      Past Medical History:  Diagnosis Date  . Anemia   . Basal cell adenocarcinoma 09/03/2008   Qualifier: Diagnosis of  By: Lorelei Pont MD, Frederico Hamman    Overview:  Overview:  Qualifier: Diagnosis of  By: Lorelei Pont MD, Frederico Hamman   . Basal cell carcinoma of lip 2007   multiple sites  . ED (erectile dysfunction)   . GERD (gastroesophageal reflux disease)   . HLD (hyperlipidemia)   . Hx of squamous cell carcinoma of skin 08/25/2016   L superior pectoral  . Hypertension   . Osteoarthritis   . Peripheral vascular disease ([place])    Occlusion and Stenosis of Carotid Artery  . Tobacco abuse   . Tubular adenoma of colon 2005  . Wears dentures    full upper and lower     Patient Active Problem List   Diagnosis Date Noted  . Gout 03/21/2017  . History of upper gastrointestinal bleeding 11/10/2016  . Angiodysplasia of stomach and duodenum without hemorrhage   . Benign neoplasm of transverse colon   . Benign neoplasm of descending colon   . Esophageal reflux 05/28/2015  . Tubular adenoma of colon 05/28/2015  . Prediabetes 04/21/2011  . Alcohol abuse 01/21/2011  . Anemia, unspecified 11/12/2009  . Basal cell carcinoma of skin 09/03/2008  .  Occlusion and stenosis of carotid artery 08/28/2008  . Insomnia 08/28/2008  . Hyperlipidemia 08/27/2008  . Essential hypertension 08/27/2008     Past Surgical History:  Procedure Laterality Date  . ANKLE ARTHROSCOPY W/ OPEN REPAIR    . COLONOSCOPY  2006   [place]-Tubular adenoma  . COLONOSCOPY N/A 07/23/2015   Procedure: COLONOSCOPY;  Surgeon: Lucilla Lame, MD;  Location: [place];  Service: Gastroenterology;  Laterality: N/A;  WITH BIOPSIES---- TRANSVERSE COLON POLYP DESCENDING COLON POLYP  X  4  . ESOPHAGOGASTRODUODENOSCOPY N/A 07/23/2015   Procedure: ESOPHAGOGASTRODUODENOSCOPY (EGD);  Surgeon: Lucilla Lame, MD;  Location: [place];  Service: Gastroenterology;  Laterality: N/A;  WITH BIOPSY--- DUODENAL BIOPSY GASTRIC BIOPSY  . ESOPHAGOGASTRODUODENOSCOPY N/A 10/28/2016   Procedure: ESOPHAGOGASTRODUODENOSCOPY (EGD);  Surgeon: Manya Silvas, MD;  Location: [facility] ENDOSCOPY;  Service: Endoscopy;  Laterality: N/A;  . ESOPHAGOGASTRODUODENOSCOPY (EGD) WITH PROPOFOL N/A 09/02/2017   Procedure: ESOPHAGOGASTRODUODENOSCOPY (EGD) WITH PROPOFOL;  Surgeon: Manya Silvas, MD;  Location: [facility] ENDOSCOPY;  Service: Endoscopy;  Laterality: N/A;  . SKIN CANCER EXCISION     LIP  . UPPER GI ENDOSCOPY  12/2009   Dr. Ivory Broad gastritisl negative for H. pylori; duodenal biopsies neg for sprue     Prior to Admission medications   Medication Sig Start Date End Date Taking? Authorizing Provider  ascorbic acid (VITAMIN C) 500 MG tablet Take by mouth.    [provider]  atorvastatin (LIPITOR) 10 MG tablet Take 1 tablet (10 mg total) by mouth daily. 05/18/19   Copland, Frederico Hamman, MD  buPROPion (WELLBUTRIN XL) 150 MG 24 hr tablet Take 1 tablet (150 mg total) by mouth daily. 05/18/19   Copland, Frederico Hamman, MD  Colchicine (MITIGARE) 0.6 MG CAPS Take 1 tablet by mouth 2 (two) times daily as needed. 07/23/19   Copland, Frederico Hamman, MD  Diclofenac Sodium 1.5 % SOLN Apply to small joints up to  4 times a day as needed 07/16/19   Copland, Frederico Hamman, MD  esomeprazole (NEXIUM) 20 MG capsule Take 40 mg by mouth 2 (two) times daily before a meal.    [provider]  eszopiclone (LUNESTA) 2 MG TABS tablet Take 1 tablet (2 mg total) by mouth at bedtime as needed for sleep. Take immediately before bedtime 05/18/19   Copland, Frederico Hamman, MD  loratadine (CLARITIN) 10 MG tablet Take 10 mg by mouth daily as needed for allergies.    [provider]  Multiple Vitamin (MULTIVITAMIN) tablet Take 1 tablet by mouth daily.    [provider]  valsartan-hydrochlorothiazide (DIOVAN HCT) 320-12.5 MG tablet Take 1 tablet by mouth daily. 05/18/19   Owens Loffler, MD     Allergies Patient has no known allergies.   Family History  Problem Relation Age of Onset  . Cancer Mother 26       LUNG  . Cancer Father        THROAT  . Hearing loss Father        HEART ATTACK, CAD  . Cancer Sister        OVARIAN    Social History Social History   Tobacco Use  . Smoking status: Current Some Day Smoker    Packs/day: 0.50    Years: 40.00    Pack years: 20.00    Types: Cigarettes  . Smokeless tobacco: Never Used  . Tobacco comment: cigars occassionally  Substance Use Topics  . Alcohol use: Yes    Alcohol/week: 14.0 standard drinks    Types: 14 Glasses of wine per week    Comment: 2 glasses of wine daily  . Drug use: No    Review of Systems  Constitutional:   No fever or chills.  ENT:   No sore throat. No rhinorrhea. Cardiovascular:   No chest pain or syncope. Respiratory:   No dyspnea or cough. Gastrointestinal:   Negative for abdominal pain, vomiting and diarrhea.  Musculoskeletal:   Negative for focal pain or swelling All other systems reviewed and are negative except as documented above in ROS and HPI.  ____________________________________________   PHYSICAL EXAM:  VITAL SIGNS: ED Triage Vitals  Enc Vitals Group     BP 02/01/20 1122 (!) 158/90     Pulse Rate  02/01/20 1122 69     Resp 02/01/20 1122 18     Temp 02/01/20 1122 98.6 F (37 C)     Temp Source 02/01/20 1122 Oral     SpO2 02/01/20 1122 95 %     Weight 02/01/20 1108 185 lb (83.9 kg)     Height 02/01/20 1108 5\' 8"  (1.727 m)     Head Circumference --      Peak Flow --      Pain Score 02/01/20 1108 0     Pain Loc --      Pain Edu? --      Excl. in [place]? --     Vital signs reviewed, nursing assessments reviewed.   Constitutional:   Alert  and oriented. Non-toxic appearance.  Ambulatory Eyes:   Conjunctivae are normal. EOMI. PERRL. ENT      Head:   Normocephalic and atraumatic.      Nose:   Wearing a mask.      Mouth/Throat:   Wearing a mask.      Neck:   No meningismus. Full ROM. Hematological/Lymphatic/Immunilogical:   No cervical lymphadenopathy. Cardiovascular:   RRR. Symmetric bilateral radial and DP pulses.  No murmurs. Cap refill less than 2 seconds. Respiratory:   Normal respiratory effort without tachypnea/retractions. Breath sounds are clear and equal bilaterally. No wheezes/rales/rhonchi. Gastrointestinal:   Soft and nontender. Non distended. There is no CVA tenderness.  No rebound, rigidity, or guarding.  Rectal exam shows small external hemorrhoid that is noninflamed thrombosed or bleeding.  Dark formed stool, Hemoccult positive.  Musculoskeletal:   Normal range of motion in all extremities. No joint effusions.  No lower extremity tenderness.  No edema. Neurologic:   Normal speech and language.  Motor grossly intact. No acute focal neurologic deficits are appreciated.  Skin:    Skin is warm, dry and intact. No rash noted.  No petechiae, purpura, or bullae.  ____________________________________________    LABS (pertinent positives/negatives) (all labs ordered are listed, but only abnormal results are displayed) Labs Reviewed  COMPREHENSIVE METABOLIC PANEL - Abnormal; Notable for the following components:      Result Value   CO2 20 (*)    Glucose, Bld 152 (*)     BUN 35 (*)    AST 50 (*)    Total Bilirubin 1.3 (*)    GFR calc non Af Amer 58 (*)    Anion gap 17 (*)    All other components within normal limits  CBC - Abnormal; Notable for the following components:   RBC 3.55 (*)    Hemoglobin 11.1 (*)    HCT 32.4 (*)    All other components within normal limits  TYPE AND SCREEN   ____________________________________________   EKG    ____________________________________________    RADIOLOGY  No results found.  ____________________________________________   PROCEDURES Procedures  ____________________________________________    CLINICAL IMPRESSION / ASSESSMENT AND PLAN / ED COURSE  Medications ordered in the ED: Medications - No data to display  Pertinent labs & imaging results that were available during my care of the patient were reviewed by me and considered in my medical decision making (see chart for details).  Devin Garza was evaluated in Emergency Department on 02/01/2020 for the symptoms described in the history of present illness. He was evaluated in the context of the global COVID-19 pandemic, which necessitated consideration that the patient might be at risk for infection with the SARS-CoV-2 virus that causes COVID-19. Institutional protocols and algorithms that pertain to the evaluation of patients at risk for COVID-19 are in a state of rapid change based on information released by regulatory bodies including the CDC and federal and state organizations. These policies and algorithms were followed during the patient's care in the ED.   Patient presents with fatigue and dark stool, exam consistent with occult GI bleeding.  Vital signs are normal, hemoglobin is stable compared to 6 months ago at 11.  Not taking any high risk medications.  Offered hospitalization for GI evaluation, but he feels comfortable following up with his established gastroenterology clinic.  Return precautions discussed for any worsening symptoms.       ____________________________________________   FINAL CLINICAL IMPRESSION(S) / ED DIAGNOSES    Final diagnoses:  Occult GI bleeding     ED Discharge Orders    None      Portions of this note were generated with dragon dictation software. Dictation errors may occur despite best attempts at proofreading.   Carrie Mew, MD 02/01/20 1341

## 2020-02-01 NOTE — Telephone Encounter (Signed)
Patient called today He stated he is on his way to [facility]. He stated he is having stomach bleeding and you are aware that he has previously had this before. Patient wanted to make you aware that he is on his way to the hospital

## 2020-02-01 NOTE — ED Triage Notes (Signed)
Weakness and dark stool.

## 2020-02-04 ENCOUNTER — Telehealth: Payer: Self-pay | Admitting: Gastroenterology

## 2020-02-04 NOTE — Telephone Encounter (Signed)
Called pt to offer Ed f/u apt with Dr. Marius Ditch he states he is following up with his GI at [facility]

## 2020-02-12 DIAGNOSIS — Z86010 Personal history of colonic polyps: Secondary | ICD-10-CM | POA: Diagnosis not present

## 2020-02-12 DIAGNOSIS — R195 Other fecal abnormalities: Secondary | ICD-10-CM | POA: Diagnosis not present

## 2020-02-12 DIAGNOSIS — D649 Anemia, unspecified: Secondary | ICD-10-CM | POA: Diagnosis not present

## 2020-02-12 DIAGNOSIS — D132 Benign neoplasm of duodenum: Secondary | ICD-10-CM | POA: Diagnosis not present

## 2020-02-12 DIAGNOSIS — R7401 Elevation of levels of liver transaminase levels: Secondary | ICD-10-CM | POA: Diagnosis not present

## 2020-02-18 ENCOUNTER — Telehealth: Payer: Self-pay

## 2020-02-18 NOTE — Telephone Encounter (Signed)
Appointment 3/3

## 2020-02-18 NOTE — Telephone Encounter (Signed)
[place] Night - Client Nonclinical Telephone Record AccessNurse Client [place] Night - Client Client Site [place] Physician Owens Loffler - MD Contact Type Call Who Is Calling Patient / Member / Family / Caregiver Caller Name Baley Mcquain Caller Phone Number [phone] Patient Name Devin Garza Patient DOB 02-02-1941 Call Type Message Only Information Provided Reason for Call Request to Schedule Office Appointment Initial Comment Caller needs to schedule appt. Additional Comment Disp. Time Disposition Final User 02/15/2020 5:06:20 PM General Information Provided Yes Renato Shin Call Closed By: Renato Shin Transaction Date/Time: 02/15/2020 5:04:50 PM (ET)

## 2020-02-22 ENCOUNTER — Other Ambulatory Visit (INDEPENDENT_AMBULATORY_CARE_PROVIDER_SITE_OTHER): Payer: Medicare Other

## 2020-02-22 ENCOUNTER — Other Ambulatory Visit: Payer: Self-pay

## 2020-02-22 DIAGNOSIS — E7800 Pure hypercholesterolemia, unspecified: Secondary | ICD-10-CM

## 2020-02-22 DIAGNOSIS — Z79899 Other long term (current) drug therapy: Secondary | ICD-10-CM | POA: Diagnosis not present

## 2020-02-22 DIAGNOSIS — N401 Enlarged prostate with lower urinary tract symptoms: Secondary | ICD-10-CM | POA: Diagnosis not present

## 2020-02-22 DIAGNOSIS — R351 Nocturia: Secondary | ICD-10-CM | POA: Diagnosis not present

## 2020-02-22 LAB — CBC WITH DIFFERENTIAL/PLATELET
Basophils Absolute: 0.1 10*3/uL (ref 0.0–0.1)
Basophils Relative: 1.4 % (ref 0.0–3.0)
Eosinophils Absolute: 0.3 10*3/uL (ref 0.0–0.7)
Eosinophils Relative: 4.9 % (ref 0.0–5.0)
HCT: 33.8 % — ABNORMAL LOW (ref 39.0–52.0)
Hemoglobin: 11.4 g/dL — ABNORMAL LOW (ref 13.0–17.0)
Lymphocytes Relative: 26.4 % (ref 12.0–46.0)
Lymphs Abs: 1.4 10*3/uL (ref 0.7–4.0)
MCHC: 33.6 g/dL (ref 30.0–36.0)
MCV: 94.6 fl (ref 78.0–100.0)
Monocytes Absolute: 0.7 10*3/uL (ref 0.1–1.0)
Monocytes Relative: 13.5 % — ABNORMAL HIGH (ref 3.0–12.0)
Neutro Abs: 2.9 10*3/uL (ref 1.4–7.7)
Neutrophils Relative %: 53.8 % (ref 43.0–77.0)
Platelets: 286 10*3/uL (ref 150.0–400.0)
RBC: 3.57 Mil/uL — ABNORMAL LOW (ref 4.22–5.81)
RDW: 15 % (ref 11.5–15.5)
WBC: 5.5 10*3/uL (ref 4.0–10.5)

## 2020-02-22 LAB — LIPID PANEL
Cholesterol: 190 mg/dL (ref 0–200)
HDL: 86.2 mg/dL (ref >39.00)
LDL Cholesterol: 93 mg/dL (ref 0–99)
NonHDL: 104.24
Total CHOL/HDL Ratio: 2
Triglycerides: 56 mg/dL (ref 0.0–149.0)
VLDL: 11.2 mg/dL (ref 0.0–40.0)

## 2020-02-22 LAB — BASIC METABOLIC PANEL
BUN: 17 mg/dL (ref 6–23)
CO2: 26 mEq/L (ref 19–32)
Calcium: 8.9 mg/dL (ref 8.4–10.5)
Chloride: 103 mEq/L (ref 96–112)
Creatinine, Ser: 0.97 mg/dL (ref 0.40–1.50)
GFR: 74.57 mL/min (ref >60.00)
Glucose, Bld: 126 mg/dL — ABNORMAL HIGH (ref 70–99)
Potassium: 4.5 mEq/L (ref 3.5–5.1)
Sodium: 138 mEq/L (ref 135–145)

## 2020-02-22 LAB — HEPATIC FUNCTION PANEL
ALT: 17 U/L (ref 0–53)
AST: 23 U/L (ref 0–37)
Albumin: 4.1 g/dL (ref 3.5–5.2)
Alkaline Phosphatase: 54 U/L (ref 39–117)
Bilirubin, Direct: 0.1 mg/dL (ref 0.0–0.3)
Total Bilirubin: 0.5 mg/dL (ref 0.2–1.2)
Total Protein: 7 g/dL (ref 6.0–8.3)

## 2020-02-25 ENCOUNTER — Encounter: Payer: Self-pay | Admitting: Podiatry

## 2020-02-25 ENCOUNTER — Ambulatory Visit (INDEPENDENT_AMBULATORY_CARE_PROVIDER_SITE_OTHER): Payer: Medicare Other | Admitting: Podiatry

## 2020-02-25 ENCOUNTER — Other Ambulatory Visit (INDEPENDENT_AMBULATORY_CARE_PROVIDER_SITE_OTHER): Payer: Medicare Other

## 2020-02-25 ENCOUNTER — Other Ambulatory Visit: Payer: Self-pay

## 2020-02-25 DIAGNOSIS — M79609 Pain in unspecified limb: Secondary | ICD-10-CM

## 2020-02-25 DIAGNOSIS — B351 Tinea unguium: Secondary | ICD-10-CM

## 2020-02-25 DIAGNOSIS — L600 Ingrowing nail: Secondary | ICD-10-CM | POA: Diagnosis not present

## 2020-02-25 DIAGNOSIS — L608 Other nail disorders: Secondary | ICD-10-CM | POA: Diagnosis not present

## 2020-02-25 DIAGNOSIS — R739 Hyperglycemia, unspecified: Secondary | ICD-10-CM | POA: Diagnosis not present

## 2020-02-25 DIAGNOSIS — M79676 Pain in unspecified toe(s): Secondary | ICD-10-CM | POA: Diagnosis not present

## 2020-02-25 LAB — HEMOGLOBIN A1C: Hgb A1c MFr Bld: 5.2 % (ref 4.6–6.5)

## 2020-02-25 LAB — PSA, TOTAL WITH REFLEX TO PSA, FREE: PSA, Total: 1.2 ng/mL (ref <=4.0)

## 2020-02-25 NOTE — Progress Notes (Signed)
Complaint:  Visit Type: Patient returns to my office for continued preventative foot care services. Complaint: Patient states" my nails have grown long and thick and become painful to walk and wear shoes"  The patient presents for preventative foot care services. No changes to ROS.   Podiatric Exam: Vascular: dorsalis pedis and posterior tibial pulses are palpable bilateral. Capillary return is immediate. Temperature gradient is WNL. Skin turgor WNL  Sensorium: Normal Semmes Weinstein monofilament test. Normal tactile sensation bilaterally. Nail Exam: Pt has thick disfigured discolored nails with subungual debris noted bilateral entire nail hallux through fifth toenails.  Ingrown toenail medial border hallux  B/L.  Ulcer Exam: There is no evidence of ulcer or pre-ulcerative changes or infection. Orthopedic Exam: Muscle tone and strength are WNL. No limitations in general ROM. No crepitus or effusions noted. HAV  B/L.  Hammer toe second right.   Skin: No Porokeratosis. No infection or ulcers  Diagnosis:  Onychomycosis, , Pain in right toe, pain in left toes,  Pincer nails   Treatment & Plan Procedures and Treatment: Consent by patient was obtained for treatment procedures. The patient understood the discussion of treatment and procedures well. All questions were answered thoroughly reviewed. Debridement of mycotic and hypertrophic toenails, 1 through 5 bilateral and clearing of subungual debris. No ulceration, no infection noted.  Return Visit-Office Procedure: Patient instructed to return to the office for a follow up visit 10 weeks  for continued evaluation and treatment.    Gardiner Barefoot DPM

## 2020-02-27 ENCOUNTER — Ambulatory Visit: Payer: Medicare Other | Admitting: Family Medicine

## 2020-03-03 DIAGNOSIS — Z85828 Personal history of other malignant neoplasm of skin: Secondary | ICD-10-CM | POA: Diagnosis not present

## 2020-03-03 DIAGNOSIS — Z1283 Encounter for screening for malignant neoplasm of skin: Secondary | ICD-10-CM | POA: Diagnosis not present

## 2020-03-03 DIAGNOSIS — D485 Neoplasm of uncertain behavior of skin: Secondary | ICD-10-CM | POA: Diagnosis not present

## 2020-03-03 DIAGNOSIS — D692 Other nonthrombocytopenic purpura: Secondary | ICD-10-CM | POA: Diagnosis not present

## 2020-03-03 DIAGNOSIS — L570 Actinic keratosis: Secondary | ICD-10-CM | POA: Diagnosis not present

## 2020-03-03 DIAGNOSIS — L820 Inflamed seborrheic keratosis: Secondary | ICD-10-CM | POA: Diagnosis not present

## 2020-03-03 DIAGNOSIS — L578 Other skin changes due to chronic exposure to nonionizing radiation: Secondary | ICD-10-CM | POA: Diagnosis not present

## 2020-04-01 ENCOUNTER — Other Ambulatory Visit: Payer: Self-pay | Admitting: *Deleted

## 2020-04-01 MED ORDER — ESZOPICLONE 2 MG PO TABS: 2.0000 mg | ORAL_TABLET | Freq: Every evening | ORAL | 1 refills | Status: DC | PRN

## 2020-04-01 NOTE — Telephone Encounter (Signed)
Last office visit 07/16/2019 for CPE.  Last refilled 05/18/2019 for #90 with 1 refill.  CPE scheduled for 07/16/2020.

## 2020-04-02 ENCOUNTER — Ambulatory Visit: Admit: 2020-04-02 | Payer: Medicare Other | Admitting: General Surgery

## 2020-04-02 SURGERY — COLONOSCOPY WITH PROPOFOL
Anesthesia: General

## 2020-05-07 ENCOUNTER — Ambulatory Visit: Payer: BLUE CROSS/BLUE SHIELD | Admitting: Podiatry

## 2020-05-12 ENCOUNTER — Emergency Department (HOSPITAL_COMMUNITY)
Admission: EM | Admit: 2020-05-12 | Discharge: 2020-05-12 | Disposition: A | Discharge status: Home / Self Care | Payer: Medicare Other | Attending: Emergency Medicine | Admitting: Emergency Medicine

## 2020-05-12 ENCOUNTER — Other Ambulatory Visit: Payer: Self-pay

## 2020-05-12 ENCOUNTER — Encounter (HOSPITAL_COMMUNITY): Payer: Self-pay

## 2020-05-12 ENCOUNTER — Emergency Department (HOSPITAL_COMMUNITY): Payer: Medicare Other

## 2020-05-12 DIAGNOSIS — Z85818 Personal history of malignant neoplasm of other sites of lip, oral cavity, and pharynx: Secondary | ICD-10-CM | POA: Insufficient documentation

## 2020-05-12 DIAGNOSIS — R5383 Other fatigue: Secondary | ICD-10-CM | POA: Diagnosis not present

## 2020-05-12 DIAGNOSIS — R195 Other fecal abnormalities: Secondary | ICD-10-CM | POA: Diagnosis not present

## 2020-05-12 DIAGNOSIS — F17210 Nicotine dependence, cigarettes, uncomplicated: Secondary | ICD-10-CM | POA: Insufficient documentation

## 2020-05-12 DIAGNOSIS — D124 Benign neoplasm of descending colon: Secondary | ICD-10-CM | POA: Diagnosis not present

## 2020-05-12 DIAGNOSIS — D123 Benign neoplasm of transverse colon: Secondary | ICD-10-CM | POA: Diagnosis not present

## 2020-05-12 DIAGNOSIS — K921 Melena: Secondary | ICD-10-CM | POA: Diagnosis not present

## 2020-05-12 DIAGNOSIS — Z79899 Other long term (current) drug therapy: Secondary | ICD-10-CM | POA: Insufficient documentation

## 2020-05-12 DIAGNOSIS — Z85828 Personal history of other malignant neoplasm of skin: Secondary | ICD-10-CM | POA: Insufficient documentation

## 2020-05-12 DIAGNOSIS — I10 Essential (primary) hypertension: Secondary | ICD-10-CM | POA: Diagnosis not present

## 2020-05-12 DIAGNOSIS — R42 Dizziness and giddiness: Secondary | ICD-10-CM | POA: Insufficient documentation

## 2020-05-12 DIAGNOSIS — R531 Weakness: Secondary | ICD-10-CM | POA: Diagnosis not present

## 2020-05-12 DIAGNOSIS — R000 Tachycardia, unspecified: Secondary | ICD-10-CM | POA: Diagnosis not present

## 2020-05-12 DIAGNOSIS — I517 Cardiomegaly: Secondary | ICD-10-CM | POA: Diagnosis not present

## 2020-05-12 DIAGNOSIS — R0602 Shortness of breath: Secondary | ICD-10-CM | POA: Diagnosis not present

## 2020-05-12 HISTORY — DX: Encounter for other specified aftercare: Z51.89

## 2020-05-12 LAB — COMPREHENSIVE METABOLIC PANEL
ALT: 25 U/L (ref 0–44)
AST: 30 U/L (ref 15–41)
Albumin: 3.7 g/dL (ref 3.5–5.0)
Alkaline Phosphatase: 59 U/L (ref 38–126)
Anion gap: 16 — ABNORMAL HIGH (ref 5–15)
BUN: 22 mg/dL (ref 8–23)
CO2: 19 mmol/L — ABNORMAL LOW (ref 22–32)
Calcium: 8.3 mg/dL — ABNORMAL LOW (ref 8.9–10.3)
Chloride: 100 mmol/L (ref 98–111)
Creatinine, Ser: 1.2 mg/dL (ref 0.61–1.24)
GFR calc Af Amer: >60 mL/min (ref >60)
GFR calc non Af Amer: 57 mL/min — ABNORMAL LOW (ref >60)
Glucose, Bld: 114 mg/dL — ABNORMAL HIGH (ref 70–99)
Potassium: 3.6 mmol/L (ref 3.5–5.1)
Sodium: 135 mmol/L (ref 135–145)
Total Bilirubin: 0.2 mg/dL — ABNORMAL LOW (ref 0.3–1.2)
Total Protein: 7.1 g/dL (ref 6.5–8.1)

## 2020-05-12 LAB — TYPE AND SCREEN
ABO/RH(D): A POS
Antibody Screen: NEGATIVE

## 2020-05-12 LAB — CBC
HCT: 34.7 % — ABNORMAL LOW (ref 39.0–52.0)
Hemoglobin: 11.8 g/dL — ABNORMAL LOW (ref 13.0–17.0)
MCH: 32.2 pg (ref 26.0–34.0)
MCHC: 34 g/dL (ref 30.0–36.0)
MCV: 94.6 fL (ref 80.0–100.0)
Platelets: 229 10*3/uL (ref 150–400)
RBC: 3.67 MIL/uL — ABNORMAL LOW (ref 4.22–5.81)
RDW: 12.7 % (ref 11.5–15.5)
WBC: 4.9 10*3/uL (ref 4.0–10.5)
nRBC: 0 % (ref 0.0–0.2)

## 2020-05-12 LAB — LACTIC ACID, PLASMA: Lactic Acid, Venous: 1.6 mmol/L (ref 0.5–1.9)

## 2020-05-12 LAB — ABO/RH: ABO/RH(D): A POS

## 2020-05-12 LAB — POC OCCULT BLOOD, ED: Fecal Occult Bld: NEGATIVE

## 2020-05-12 IMAGING — DX DG CHEST 1V PORT
1 series · 1 of 1 positions shown · non-contrast
Comparison: None.

CLINICAL DATA: Weakness

EXAM:
PORTABLE CHEST 1 VIEW

[chest]
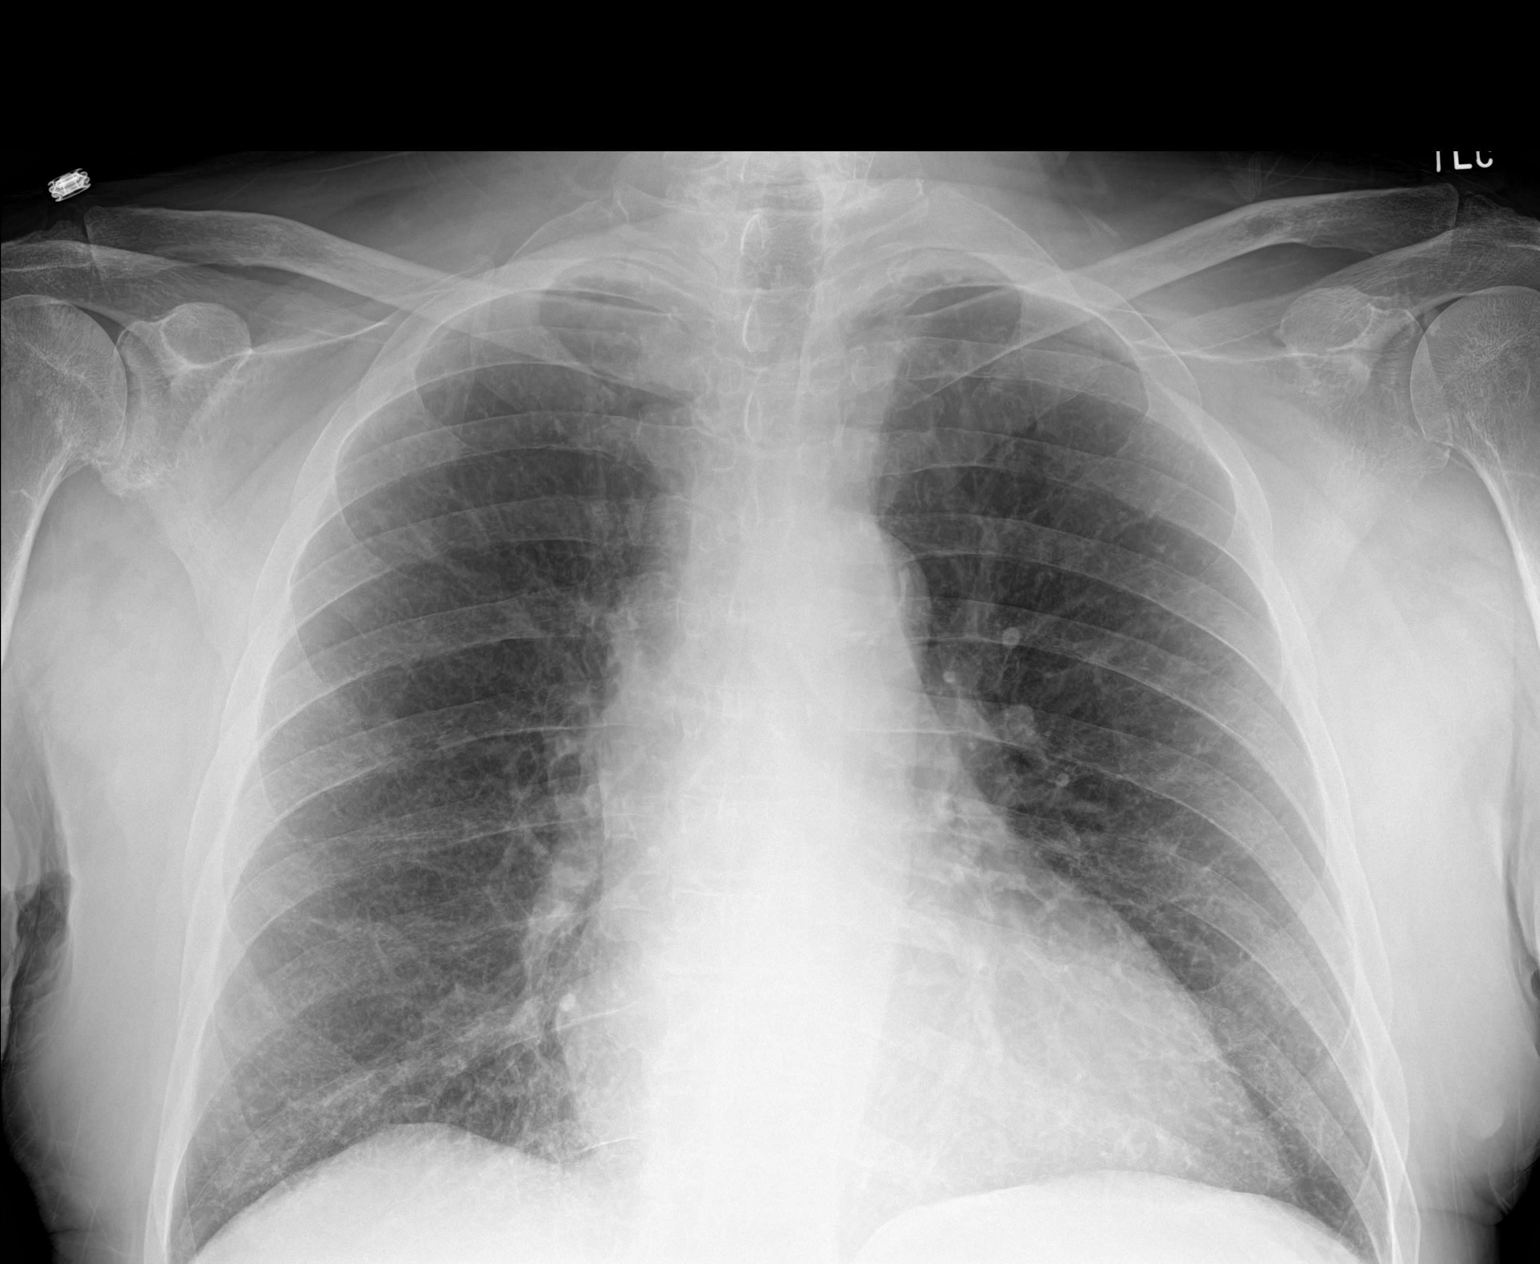

[1 of 1 positions shown; findings below may reference images not displayed]

FINDINGS: Heart size is mildly enlarged. Atherosclerotic calcification of the
aortic knob. Mildly coarsened interstitial markings throughout both
lungs. Arthropathy of the right glenohumeral joint. No acute osseous
findings.
IMPRESSION: 1. Chronic appearing bronchitic type lung changes.
2. Mild cardiomegaly.

## 2020-05-12 MED ORDER — SODIUM CHLORIDE 0.9 % IV BOLUS: 1000.0000 mL | Freq: Once | INTRAVENOUS | Status: DC

## 2020-05-12 MED ORDER — LACTATED RINGERS IV BOLUS
1000.0000 mL | Freq: Once | INTRAVENOUS | Status: AC
  Administered 2020-05-12: 1000 mL via INTRAVENOUS

## 2020-05-12 NOTE — ED Triage Notes (Signed)
Pt reports black stools for the past week, denies abd pain or n/v. Pt has hx of same 2 years ago with blood transfusion.

## 2020-05-12 NOTE — ED Provider Notes (Signed)
[facility] EMERGENCY DEPARTMENT Provider Note   CSN: [id number] Arrival date & time: 05/12/20  [id number]     History Chief Complaint  Patient presents with  . Melena    Devin Garza is a 79 y.o. male.  HPI 79 year old male presents with concern for rectal bleeding.  Has had black stools for several months.  Went to [facility] a few months ago where his hemoglobin was in the 11 range and he was referred for colonoscopy.  However he had to cancel the colonoscopy because of issues with selling his house.  Continues to have the black stools.  The other day he was weak and dizzy and was concerned his blood count was low.  Was not feeling well but no real specific symptoms today.  No chest pain.  He has chronic shortness of breath that is unchanged.  No blood actually seen in his stools.  No headache, vomiting, abdominal pain.  Son notes concerned that the patient seemed to be weak and dizzy the other day.  He has chronic palpitations.  He chronically abuses alcohol, last using alcohol last night.  There is also concerned that the son is the only one able to take him to and from the doctor and will be here until 5/21.  They are concerned he might not be able to get a colonoscopy otherwise due to no ride.   Past Medical History:  Diagnosis Date  . Anemia   . Basal cell adenocarcinoma 09/03/2008   Qualifier: Diagnosis of  By: Lorelei Pont MD, Frederico Hamman    Overview:  Overview:  Qualifier: Diagnosis of  By: Lorelei Pont MD, Frederico Hamman   . Basal cell carcinoma of lip 2007   multiple sites  . Blood transfusion without reported diagnosis   . ED (erectile dysfunction)   . GERD (gastroesophageal reflux disease)   . HLD (hyperlipidemia)   . Hx of squamous cell carcinoma of skin 08/25/2016   L superior pectoral  . Hypertension   . Osteoarthritis   . Peripheral vascular disease ([place])    Occlusion and Stenosis of Carotid Artery  . Tobacco abuse   . Tubular adenoma of colon 2005  . Wears dentures    full upper and lower    Patient Active Problem List   Diagnosis Date Noted  . Gout 03/21/2017  . History of upper gastrointestinal bleeding 11/10/2016  . Angiodysplasia of stomach and duodenum without hemorrhage   . Benign neoplasm of transverse colon   . Benign neoplasm of descending colon   . Esophageal reflux 05/28/2015  . Tubular adenoma of colon 05/28/2015  . Prediabetes 04/21/2011  . Alcohol abuse 01/21/2011  . Anemia, unspecified 11/12/2009  . Basal cell carcinoma of skin 09/03/2008  . Occlusion and stenosis of carotid artery 08/28/2008  . Insomnia 08/28/2008  . Hyperlipidemia 08/27/2008  . Essential hypertension 08/27/2008    Past Surgical History:  Procedure Laterality Date  . ANKLE ARTHROSCOPY W/ OPEN REPAIR    . COLONOSCOPY  2006   [place]-Tubular adenoma  . COLONOSCOPY N/A 07/23/2015   Procedure: COLONOSCOPY;  Surgeon: Lucilla Lame, MD;  Location: [place];  Service: Gastroenterology;  Laterality: N/A;  WITH BIOPSIES---- TRANSVERSE COLON POLYP DESCENDING COLON POLYP  X  4  . ESOPHAGOGASTRODUODENOSCOPY N/A 07/23/2015   Procedure: ESOPHAGOGASTRODUODENOSCOPY (EGD);  Surgeon: Lucilla Lame, MD;  Location: [place];  Service: Gastroenterology;  Laterality: N/A;  WITH BIOPSY--- DUODENAL BIOPSY GASTRIC BIOPSY  . ESOPHAGOGASTRODUODENOSCOPY N/A 10/28/2016   Procedure: ESOPHAGOGASTRODUODENOSCOPY (EGD);  Surgeon: Gavin Pound  Vira Agar, MD;  Location: [place] ENDOSCOPY;  Service: Endoscopy;  Laterality: N/A;  . ESOPHAGOGASTRODUODENOSCOPY (EGD) WITH PROPOFOL N/A 09/02/2017   Procedure: ESOPHAGOGASTRODUODENOSCOPY (EGD) WITH PROPOFOL;  Surgeon: Manya Silvas, MD;  Location[facility] ENDOSCOPY;  Service: Endoscopy;  Laterality: N/A;  . SKIN CANCER EXCISION     LIP  . UPPER GI ENDOSCOPY  12/2009   Dr. Ivory Broad gastritisl negative for H. pylori; duodenal biopsies neg for sprue       Family History  Problem Relation Age of Onset  . Cancer Mother 20       LUNG  .  Cancer Father        THROAT  . Hearing loss Father        HEART ATTACK, CAD  . Cancer Sister        OVARIAN    Social History   Tobacco Use  . Smoking status: Current Some Day Smoker    Packs/day: 0.50    Years: 40.00    Pack years: 20.00    Types: Cigarettes  . Smokeless tobacco: Never Used  . Tobacco comment: cigars occassionally  Substance Use Topics  . Alcohol use: Yes    Alcohol/week: 14.0 standard drinks    Types: 14 Glasses of wine per week    Comment: 2 glasses of wine daily  . Drug use: No    Home Medications Prior to Admission medications   Medication Sig Start Date End Date Taking? Authorizing Provider  ascorbic acid (VITAMIN C) 500 MG tablet Take by mouth.    [provider]  atorvastatin (LIPITOR) 10 MG tablet Take 1 tablet (10 mg total) by mouth daily. 05/18/19   Copland, Frederico Hamman, MD  buPROPion (WELLBUTRIN XL) 150 MG 24 hr tablet Take 1 tablet (150 mg total) by mouth daily. 05/18/19   Copland, Frederico Hamman, MD  Colchicine (MITIGARE) 0.6 MG CAPS Take 1 tablet by mouth 2 (two) times daily as needed. 07/23/19   Copland, Frederico Hamman, MD  Diclofenac Sodium 1.5 % SOLN Apply to small joints up to 4 times a day as needed 07/16/19   Copland, Frederico Hamman, MD  esomeprazole (NEXIUM) 20 MG capsule Take 40 mg by mouth 2 (two) times daily before a meal.    [provider]  eszopiclone (LUNESTA) 2 MG TABS tablet Take 1 tablet (2 mg total) by mouth at bedtime as needed for sleep. Take immediately before bedtime 04/01/20   Copland, Frederico Hamman, MD  loratadine (CLARITIN) 10 MG tablet Take 10 mg by mouth daily as needed for allergies.    [provider]  Multiple Vitamin (MULTIVITAMIN) tablet Take 1 tablet by mouth daily.    [provider]  valsartan-hydrochlorothiazide (DIOVAN HCT) 320-12.5 MG tablet Take 1 tablet by mouth daily. 05/18/19   Owens Loffler, MD    Allergies    Patient has no known allergies.  Review of Systems   Review of Systems  Constitutional:  Negative for fever.  Respiratory: Negative for shortness of breath.   Cardiovascular: Positive for palpitations (chronic). Negative for chest pain.  Gastrointestinal: Negative for abdominal pain, blood in stool and vomiting.  Neurological: Positive for tremors (x 3 weeks).  All other systems reviewed and are negative.   Physical Exam Updated Vital Signs BP (!) 171/101   Pulse (!) 46   Temp 99.2 F (37.3 C)   Resp (!) 25   Ht 5\' 9"  (1.753 m)   Wt 81.6 kg   SpO2 97%   BMI 26.58 kg/m   Physical Exam Vitals and nursing note  reviewed.  Constitutional:      Appearance: He is well-developed.  HENT:     Head: Normocephalic and atraumatic.     Right Ear: External ear normal.     Left Ear: External ear normal.     Nose: Nose normal.  Eyes:     General:        Right eye: No discharge.        Left eye: No discharge.     Extraocular Movements: Extraocular movements intact.     Pupils: Pupils are equal, round, and reactive to light.  Cardiovascular:     Rate and Rhythm: Tachycardia present. Rhythm irregular.     Heart sounds: Normal heart sounds.  Pulmonary:     Effort: Pulmonary effort is normal.     Breath sounds: Normal breath sounds.  Abdominal:     Palpations: Abdomen is soft.     Tenderness: There is no abdominal tenderness.  Genitourinary:    Rectum: Guaiac result negative. No mass, tenderness, anal fissure, external hemorrhoid or internal hemorrhoid. Normal anal tone.  Musculoskeletal:     Cervical back: Neck supple.  Skin:    General: Skin is warm and dry.  Neurological:     Mental Status: He is alert.     Comments: CN 3-12 grossly intact. 5/5 strength in all 4 extremities. Grossly normal sensation. Normal finger to nose. Mild tremor  Psychiatric:        Mood and Affect: Mood is not anxious.     ED Results / Procedures / Treatments   Labs (all labs ordered are listed, but only abnormal results are displayed) Labs Reviewed  COMPREHENSIVE METABOLIC PANEL -  Abnormal; Notable for the following components:      Result Value   CO2 19 (*)    Glucose, Bld 114 (*)    Calcium 8.3 (*)    Total Bilirubin 0.2 (*)    GFR calc non Af Amer 57 (*)    Anion gap 16 (*)    All other components within normal limits  CBC - Abnormal; Notable for the following components:   RBC 3.67 (*)    Hemoglobin 11.8 (*)    HCT 34.7 (*)    All other components within normal limits  LACTIC ACID, PLASMA  POC OCCULT BLOOD, ED  TYPE AND SCREEN  ABO/RH    EKG EKG Interpretation  Date/Time:  Monday May 12 2020 13:50:53 EDT Ventricular Rate:  100 PR Interval:    QRS Duration: 100 QT Interval:  357 QTC Calculation: 461 R Axis:   -6 Text Interpretation: Sinus tachycardia Ventricular trigeminy Low voltage, precordial leads Probable left ventricular hypertrophy Baseline wander in lead(s) V2 V3 V4 V6 Confirmed by Sherwood Gambler [phone]) on 05/12/2020 2:23:53 PM   Radiology DG Chest Portable 1 View  Result Date: 05/12/2020 CLINICAL DATA:  Weakness EXAM: PORTABLE CHEST 1 VIEW COMPARISON:  None. FINDINGS: Heart size is mildly enlarged. Atherosclerotic calcification of the aortic knob. Mildly coarsened interstitial markings throughout both lungs. Arthropathy of the right glenohumeral joint. No acute osseous findings. IMPRESSION: 1. Chronic appearing bronchitic type lung changes. 2. Mild cardiomegaly. Electronically Signed   By: Davina Poke D.O.   On: 05/12/2020 12:23    Procedures Procedures (including critical care time)  Medications Ordered in ED Medications  lactated ringers bolus 1,000 mL (0 mLs Intravenous Stopped 05/12/20 1512)    ED Course  I have reviewed the triage vital signs and the nursing notes.  Pertinent labs & imaging results that were  available during my care of the patient were reviewed by me and considered in my medical decision making (see chart for details).    MDM Rules/Calculators/A&P                      Patient is well-appearing.   Heart rate is mildly tachycardic but this is probably from PVCs.  Labs have been reviewed and are stable compared to baseline.  The only abnormality of any significance is mildly low bicarbonate with anion gap.  However his lactate is okay.  He was given IV fluids.  Otherwise, no urinary symptoms to be concerned for UTI or other acute infection.  Abdominal exam is benign.  No melena or grossly bloody stool on digital rectal exam.  I discussed with [facility], Azucena Freed, but there is no way to get him into an appointment this week.  He has seen [facility] gastroenterology and they recommend he be seen there or talk to him.  Otherwise, patient is still asking for a [facility] referral so I will give him the [place] number.  Discharge home with return precautions. Final Clinical Impression(s) / ED Diagnoses Final diagnoses:  Dark stools    Rx / DC Orders ED Discharge Orders         Ordered    Ambulatory referral to Gastroenterology     05/12/20 1446           Sherwood Gambler, MD 05/12/20 1534

## 2020-05-12 NOTE — Discharge Instructions (Addendum)
If you develop dizziness, lightheadedness, chest pain, abdominal pain, shortness of breath, blood in your stools, vomiting, or any other new/concerning symptoms then return to the ER or call 911.

## 2020-05-16 DIAGNOSIS — M2041 Other hammer toe(s) (acquired), right foot: Secondary | ICD-10-CM | POA: Diagnosis not present

## 2020-05-16 DIAGNOSIS — B351 Tinea unguium: Secondary | ICD-10-CM | POA: Diagnosis not present

## 2020-05-27 ENCOUNTER — Other Ambulatory Visit: Payer: Self-pay

## 2020-05-27 ENCOUNTER — Encounter: Payer: Self-pay | Admitting: Internal Medicine

## 2020-05-27 ENCOUNTER — Ambulatory Visit (INDEPENDENT_AMBULATORY_CARE_PROVIDER_SITE_OTHER): Payer: Medicare Other | Admitting: Internal Medicine

## 2020-05-27 VITALS — BP 150/80 | HR 102 | Temp 97.9°F | Ht 69.0 in | Wt 176.2 lb

## 2020-05-27 DIAGNOSIS — Z1211 Encounter for screening for malignant neoplasm of colon: Secondary | ICD-10-CM

## 2020-05-27 DIAGNOSIS — E7800 Pure hypercholesterolemia, unspecified: Secondary | ICD-10-CM | POA: Diagnosis not present

## 2020-05-27 DIAGNOSIS — E559 Vitamin D deficiency, unspecified: Secondary | ICD-10-CM

## 2020-05-27 DIAGNOSIS — G4700 Insomnia, unspecified: Secondary | ICD-10-CM

## 2020-05-27 DIAGNOSIS — K219 Gastro-esophageal reflux disease without esophagitis: Secondary | ICD-10-CM

## 2020-05-27 DIAGNOSIS — R270 Ataxia, unspecified: Secondary | ICD-10-CM

## 2020-05-27 DIAGNOSIS — I10 Essential (primary) hypertension: Secondary | ICD-10-CM

## 2020-05-27 DIAGNOSIS — Z8719 Personal history of other diseases of the digestive system: Secondary | ICD-10-CM

## 2020-05-27 DIAGNOSIS — R7303 Prediabetes: Secondary | ICD-10-CM

## 2020-05-27 DIAGNOSIS — I6523 Occlusion and stenosis of bilateral carotid arteries: Secondary | ICD-10-CM | POA: Diagnosis not present

## 2020-05-27 DIAGNOSIS — F1010 Alcohol abuse, uncomplicated: Secondary | ICD-10-CM

## 2020-05-27 LAB — VITAMIN B12: Vitamin B-12: 989 pg/mL — ABNORMAL HIGH (ref 211–911)

## 2020-05-27 LAB — VITAMIN D 25 HYDROXY (VIT D DEFICIENCY, FRACTURES): VITD: 67.84 ng/mL (ref 30.00–100.00)

## 2020-05-27 MED ORDER — BUPROPION HCL ER (XL) 150 MG PO TB24: 150.0000 mg | ORAL_TABLET | Freq: Every day | ORAL | 1 refills | Status: DC

## 2020-05-27 MED ORDER — ESZOPICLONE 2 MG PO TABS: 2.0000 mg | ORAL_TABLET | Freq: Every evening | ORAL | 1 refills | Status: DC | PRN

## 2020-05-27 MED ORDER — DIOVAN HCT 320-12.5 MG PO TABS
1.0000 | ORAL_TABLET | Freq: Every day | ORAL | 1 refills | Status: DC
Start: 2020-05-27 — End: 2020-10-24

## 2020-05-27 NOTE — Patient Instructions (Signed)
-  Nice seeing you today!!  -Lab work today; will notify you once results are available.  -check blood pressure at home 2-3 times per week and bring measurements into your next visit.  -Schedule follow up visit in 6 months.

## 2020-05-27 NOTE — Progress Notes (Signed)
Established Patient Office Visit     This visit occurred during the SARS-CoV-2 public health emergency.  Safety protocols were in place, including screening questions prior to the visit, additional usage of staff PPE, and extensive cleaning of exam room while observing appropriate contact time as indicated for disinfecting solutions.    CC/Reason for Visit: Establish care, discuss chronic medical conditions, discuss some acute concerns, medication refills  HPI: Devin Garza is a 79 y.o. male who is coming in today for the above mentioned reasons.  He has been seeing Dr. Edilia Bo.  He is transferring care as he has moved.  He has past medical history significant for carotid artery disease who has carotid ultrasounds every 12 months and he is now overdue for them, hypertension, GERD, impaired glucose tolerance, hyperlipidemia, insomnia, tobacco and alcohol abuse.  He recently moved into [employer] living, he has no known drug allergies, both father and mother died from head and neck cancer, they were smokers.  He used to be a smoker of about 15 cigarettes a day and quit around 4 months ago.  When queried about his alcohol use he states "too much" he used to drink 2 ounces of scotch plus multiple glasses of wine, has recently cut down to 1 to 2 glasses of wine a day.  He is complaining of increased gait imbalance and weakness.  He has a history of GI bleeding due to gastric AVMs, he is overdue for EGD and colonoscopy and is requesting referral today he is walking with a cane that he is not used to using.   Past Medical/Surgical History: Past Medical History:  Diagnosis Date  . Anemia   . Basal cell adenocarcinoma 09/03/2008   Qualifier: Diagnosis of  By: Lorelei Pont MD, Frederico Hamman    Overview:  Overview:  Qualifier: Diagnosis of  By: Lorelei Pont MD, Frederico Hamman   . Basal cell carcinoma of lip 2007   multiple sites  . Blood transfusion without reported diagnosis   . ED (erectile dysfunction)   .  GERD (gastroesophageal reflux disease)   . HLD (hyperlipidemia)   . Hx of squamous cell carcinoma of skin 08/25/2016   L superior pectoral  . Hypertension   . Osteoarthritis   . Peripheral vascular disease ([place])    Occlusion and Stenosis of Carotid Artery  . Tobacco abuse   . Tubular adenoma of colon 2005  . Wears dentures    full upper and lower    Past Surgical History:  Procedure Laterality Date  . ANKLE ARTHROSCOPY W/ OPEN REPAIR    . COLONOSCOPY  2006   [place]-Tubular adenoma  . COLONOSCOPY N/A 07/23/2015   Procedure: COLONOSCOPY;  Surgeon: Lucilla Lame, MD;  Location: [facility];  Service: Gastroenterology;  Laterality: N/A;  WITH BIOPSIES---- TRANSVERSE COLON POLYP DESCENDING COLON POLYP  X  4  . ESOPHAGOGASTRODUODENOSCOPY N/A 07/23/2015   Procedure: ESOPHAGOGASTRODUODENOSCOPY (EGD);  Surgeon: Lucilla Lame, MD;  Location: [facility];  Service: Gastroenterology;  Laterality: N/A;  WITH BIOPSY--- DUODENAL BIOPSY GASTRIC BIOPSY  . ESOPHAGOGASTRODUODENOSCOPY N/A 10/28/2016   Procedure: ESOPHAGOGASTRODUODENOSCOPY (EGD);  Surgeon: Manya Silvas, MD;  Location: [facility] ENDOSCOPY;  Service: Endoscopy;  Laterality: N/A;  . ESOPHAGOGASTRODUODENOSCOPY (EGD) WITH PROPOFOL N/A 09/02/2017   Procedure: ESOPHAGOGASTRODUODENOSCOPY (EGD) WITH PROPOFOL;  Surgeon: Manya Silvas, MD;  Location[facility] END[facility]COPY;  Service: Endoscopy;  Laterality: N/A;  . SKIN CANCER EXCISION     LIP  . UPPER GI ENDOSCOPY  12/2009   Dr. Ivory Broad gastritisl negative  for H. pylori; duodenal biopsies neg for sprue    Social History:  reports that he has been smoking cigarettes. He has a 20.00 pack-year smoking history. He has never used smokeless tobacco. He reports current alcohol use of about 14.0 standard drinks of alcohol per week. He reports that he does not use drugs.  Allergies: No Known Allergies  Family History:  Family History  Problem Relation Age of Onset  . Cancer Mother 98        LUNG  . Cancer Father        THROAT  . Hearing loss Father        HEART ATTACK, CAD  . Cancer Sister        OVARIAN     Current Outpatient Medications:  .  atorvastatin (LIPITOR) 10 MG tablet, Take 1 tablet (10 mg total) by mouth daily., Disp: 90 tablet, Rfl: 3 .  buPROPion (WELLBUTRIN XL) 150 MG 24 hr tablet, Take 1 tablet (150 mg total) by mouth daily., Disp: 90 tablet, Rfl: 3 .  Colchicine (MITIGARE) 0.6 MG CAPS, Take 1 tablet by mouth 2 (two) times daily as needed., Disp: 60 capsule, Rfl: 3 .  Diclofenac Sodium 1.5 % SOLN, Apply to small joints up to 4 times a day as needed, Disp: 450 mL, Rfl: 5 .  esomeprazole (NEXIUM) 20 MG capsule, Take 40 mg by mouth 2 (two) times daily before a meal., Disp: , Rfl:  .  eszopiclone (LUNESTA) 2 MG TABS tablet, Take 1 tablet (2 mg total) by mouth at bedtime as needed for sleep. Take immediately before bedtime, Disp: 90 tablet, Rfl: 1 .  loratadine (CLARITIN) 10 MG tablet, Take 10 mg by mouth daily as needed for allergies., Disp: , Rfl:  .  Multiple Vitamin (MULTIVITAMIN) tablet, Take 1 tablet by mouth daily., Disp: , Rfl:  .  valsartan-hydrochlorothiazide (DIOVAN HCT) 320-12.5 MG tablet, Take 1 tablet by mouth daily. (Patient taking differently: Take 1 tablet by mouth daily. Brand name only), Disp: 90 tablet, Rfl: 3  Review of Systems:  Constitutional: Denies fever, chills, diaphoresis, appetite change and fatigue.  HEENT: Denies photophobia, eye pain, redness, hearing loss, ear pain, congestion, sore throat, rhinorrhea, sneezing, mouth sores, trouble swallowing, neck pain, neck stiffness and tinnitus.   Respiratory: Denies SOB, DOE, cough, chest tightness,  and wheezing.   Cardiovascular: Denies chest pain, palpitations and leg swelling.  Gastrointestinal: Denies nausea, vomiting, abdominal pain, diarrhea, constipation, blood in stool and abdominal distention.  Genitourinary: Denies dysuria, urgency, frequency, hematuria, flank pain and  difficulty urinating.  Endocrine: Denies: hot or cold intolerance, sweats, changes in hair or nails, polyuria, polydipsia. Musculoskeletal: Denies myalgias, back pain, joint swelling, arthralgias and gait problem.  Skin: Denies pallor, rash and wound.  Neurological: Denies dizziness, seizures, syncope,  light-headedness, numbness and headaches.  Hematological: Denies adenopathy. Easy bruising, personal or family bleeding history  Psychiatric/Behavioral: Denies suicidal ideation, mood changes, confusion, nervousness, sleep disturbance and agitation    Physical Exam: Vitals:   05/27/20 1313  BP: (!) 150/80  Pulse: (!) 102  Temp: 97.9 F (36.6 C)  TempSrc: Temporal  SpO2: 96%  Weight: 176 lb 3.2 oz (79.9 kg)  Height: 5\' 9"  (1.753 m)    Body mass index is 26.02 kg/m.   Constitutional: NAD, calm, comfortable Eyes: PERRL, lids and conjunctivae normal, wears corrective lenses ENMT: Mucous membranes are moist.  Respiratory: clear to auscultation bilaterally, no wheezing, no crackles. Normal respiratory effort. No accessory muscle use.  Cardiovascular: Regular rate with  frequent extrasystolic beats and rhythm, no murmurs / rubs / gallops. No extremity edema.  Neurologic: Grossly intact and nonfocal Psychiatric: Normal judgment and insight. Alert and oriented x 3. Normal mood.    Impression and Plan:  Pure hypercholesterolemia -LDL was 93 earlier this year. -He is on Lipitor.  Essential hypertension -Not well controlled. -He will do ambulatory blood pressure monitoring and return for follow-up.  Bilateral carotid artery stenosis -He is due for carotid ultrasound.  Will order.  Insomnia, unspecified type -Refill Lunesta that he has taken long-term this.  Alcohol abuse/ataxia -He has cut down significantly but is still drinking on the basis. -Suspect he may have B12 deficiency leading to ataxia.  Check B12 levels today.  Prediabetes -Recent A1c in March was  5.2.  Gastroesophageal reflux disease, unspecified whether esophagitis present -On daily PPI therapy.  History of upper gastrointestinal bleeding -He is overdue for screening colonoscopy, initiate GI referral.    Patient Instructions  -Nice seeing you today!!  -Lab work today; will notify you once results are available.  -check blood pressure at home 2-3 times per week and bring measurements into your next visit.  -Schedule follow up visit in 6 months.     Lelon Frohlich, MD [facility]

## 2020-05-27 NOTE — Addendum Note (Signed)
Addended by: Westley Hummer B on: 05/27/2020 02:52 PM   Modules accepted: Orders

## 2020-05-29 ENCOUNTER — Ambulatory Visit: Payer: BLUE CROSS/BLUE SHIELD | Admitting: Podiatry

## 2020-06-06 ENCOUNTER — Telehealth: Payer: Self-pay | Admitting: Internal Medicine

## 2020-06-06 ENCOUNTER — Telehealth: Payer: Self-pay | Admitting: Gastroenterology

## 2020-06-06 NOTE — Telephone Encounter (Signed)
I am happy to see him back.  Please offer him my first available new GI appointment.  Do not double book.  Do not book with extender.  Thank you

## 2020-06-06 NOTE — Telephone Encounter (Signed)
Dr. Ardis Hughs, this is a former patient of yours from 2011.  He stated that he has recently moved back to [place] and would like to reestablish care.  He is referred for GERD and a colonoscopy.  Pt had a colonoscopy in 2016 by Dr. Allen Norris (available in Epic under procedures).  Pt has been seeing [facility] but would like to transfer care due to proximity.  Will you accept him back as a patient?

## 2020-06-06 NOTE — Telephone Encounter (Signed)
Pt wanted to know if Dr Jerilee Hoh is going to put in a order for carotid doppler which she recommended .

## 2020-06-06 NOTE — Telephone Encounter (Signed)
error 

## 2020-06-10 ENCOUNTER — Other Ambulatory Visit: Payer: Self-pay | Admitting: Internal Medicine

## 2020-06-10 DIAGNOSIS — I6523 Occlusion and stenosis of bilateral carotid arteries: Secondary | ICD-10-CM

## 2020-06-10 NOTE — Telephone Encounter (Signed)
Order placed. Sorry for the delay.

## 2020-06-13 ENCOUNTER — Other Ambulatory Visit: Payer: Self-pay | Admitting: Family Medicine

## 2020-06-13 ENCOUNTER — Other Ambulatory Visit: Payer: Self-pay | Admitting: *Deleted

## 2020-06-13 DIAGNOSIS — I6523 Occlusion and stenosis of bilateral carotid arteries: Secondary | ICD-10-CM

## 2020-06-16 ENCOUNTER — Encounter: Payer: Self-pay | Admitting: Family Medicine

## 2020-06-16 ENCOUNTER — Ambulatory Visit (INDEPENDENT_AMBULATORY_CARE_PROVIDER_SITE_OTHER): Payer: Medicare Other | Admitting: Family Medicine

## 2020-06-16 VITALS — BP 150/80 | HR 113 | Temp 98.4°F | Wt 177.2 lb

## 2020-06-16 DIAGNOSIS — I10 Essential (primary) hypertension: Secondary | ICD-10-CM | POA: Diagnosis not present

## 2020-06-16 DIAGNOSIS — I6523 Occlusion and stenosis of bilateral carotid arteries: Secondary | ICD-10-CM | POA: Diagnosis not present

## 2020-06-16 MED ORDER — METOPROLOL SUCCINATE ER 50 MG PO TB24: 50.0000 mg | ORAL_TABLET | Freq: Every day | ORAL | 3 refills | Status: DC

## 2020-06-16 NOTE — Progress Notes (Signed)
   Subjective:    Patient ID: Devin Garza, male    DOB: 05/02/1941, 79 y.o.   MRN: [id number]  HPI Here for elevated BP readings. He feels fine. He has been on Diovan HCT for years, but recently his readings have been as high as 160s over 90s at home.    Review of Systems  Constitutional: Negative.   Respiratory: Negative.   Cardiovascular: Negative.   Neurological: Negative.        Objective:   Physical Exam Constitutional:      General: He is not in acute distress.    Appearance: Normal appearance.  Cardiovascular:     Rate and Rhythm: Regular rhythm. Tachycardia present.     Pulses: Normal pulses.     Heart sounds: Normal heart sounds.  Pulmonary:     Effort: Pulmonary effort is normal.     Breath sounds: Normal breath sounds.  Musculoskeletal:     Right lower leg: No edema.     Left lower leg: No edema.  Neurological:     General: No focal deficit present.     Mental Status: He is alert and oriented to person, place, and time.           Assessment & Plan:  HTN, we will add Metoprolol succinate 50 mg daily. Recheck in 3-4 weeks. Alysia Penna, MD

## 2020-06-25 ENCOUNTER — Other Ambulatory Visit: Payer: Self-pay | Admitting: *Deleted

## 2020-06-25 MED ORDER — ATORVASTATIN CALCIUM 10 MG PO TABS: 10.0000 mg | ORAL_TABLET | Freq: Every day | ORAL | 1 refills | Status: DC

## 2020-06-26 ENCOUNTER — Other Ambulatory Visit: Payer: Self-pay

## 2020-06-26 ENCOUNTER — Ambulatory Visit (HOSPITAL_COMMUNITY)
Admission: RE | Admit: 2020-06-26 | Discharge: 2020-06-26 | Disposition: A | Discharge status: Home / Self Care | Payer: Medicare Other | Source: Ambulatory Visit | Attending: Cardiology | Admitting: Cardiology

## 2020-06-26 ENCOUNTER — Encounter (INDEPENDENT_AMBULATORY_CARE_PROVIDER_SITE_OTHER): Payer: Self-pay

## 2020-06-26 DIAGNOSIS — I6523 Occlusion and stenosis of bilateral carotid arteries: Secondary | ICD-10-CM | POA: Diagnosis not present

## 2020-07-09 ENCOUNTER — Other Ambulatory Visit: Payer: Medicare Other

## 2020-07-10 ENCOUNTER — Other Ambulatory Visit: Payer: Self-pay

## 2020-07-10 ENCOUNTER — Encounter: Payer: Self-pay | Admitting: Family Medicine

## 2020-07-10 ENCOUNTER — Telehealth: Payer: Self-pay | Admitting: Internal Medicine

## 2020-07-10 ENCOUNTER — Ambulatory Visit (INDEPENDENT_AMBULATORY_CARE_PROVIDER_SITE_OTHER): Payer: Medicare Other | Admitting: Family Medicine

## 2020-07-10 VITALS — BP 140/72 | HR 81 | Temp 98.6°F | Wt 176.2 lb

## 2020-07-10 DIAGNOSIS — I10 Essential (primary) hypertension: Secondary | ICD-10-CM | POA: Diagnosis not present

## 2020-07-10 DIAGNOSIS — I6523 Occlusion and stenosis of bilateral carotid arteries: Secondary | ICD-10-CM

## 2020-07-10 MED ORDER — METOPROLOL SUCCINATE ER 50 MG PO TB24
50.0000 mg | ORAL_TABLET | Freq: Every day | ORAL | 0 refills | Status: DC
Start: 2020-07-10 — End: 2020-07-21

## 2020-07-10 NOTE — Telephone Encounter (Signed)
Spoke with patient and reviewed results per Dr Jerilee Hoh.  Patient verbally understands.  Patient states "the cardiology tech has already told me".  I gave Devin Garza my name and the office number if he has any further concerns.

## 2020-07-10 NOTE — Telephone Encounter (Addendum)
Pt stated he has been calling for over three weeks now regarding his Carotid results. Pt is very upset with the office because he has not gotten a response and when he spoke to someone yesterday he was told that we will have to get back with him. Pt stated he has many complaints about the office and said he will wait all day in office of he has to in order to get his results. Sending message over for results and getting with Tanya/Brittany regarding his complaints.

## 2020-07-10 NOTE — Telephone Encounter (Signed)
Called pt to schedule appt. VM not set up.

## 2020-07-10 NOTE — Progress Notes (Signed)
   Subjective:    Patient ID: Devin Garza, male    DOB: [birth year], 79 y.o.   MRN: [id number]  HPI Here to follow up on BP. When we saw him a few weeks ago we added metoprolol succinate to his regimen, and this has worked very well for him. He feels better, and his BP at home is averaging in the 140s over 80s with pulse rates in the 70s and 80s.    Review of Systems  Constitutional: Negative.   Respiratory: Negative.   Cardiovascular: Negative.        Objective:   Physical Exam Constitutional:      Appearance: Normal appearance.  Cardiovascular:     Rate and Rhythm: Normal rate and regular rhythm.     Pulses: Normal pulses.     Heart sounds: Normal heart sounds.  Pulmonary:     Effort: Pulmonary effort is normal.     Breath sounds: Normal breath sounds.  Neurological:     Mental Status: He is alert.           Assessment & Plan:  HTN, now well controlled. He will follow up with Dr. Jerilee Hoh in 90 days. Alysia Penna, MD

## 2020-07-11 ENCOUNTER — Encounter: Payer: Self-pay | Admitting: Gastroenterology

## 2020-07-16 ENCOUNTER — Encounter: Payer: Medicare Other | Admitting: Family Medicine

## 2020-07-20 ENCOUNTER — Other Ambulatory Visit: Payer: Self-pay | Admitting: Family Medicine

## 2020-08-01 ENCOUNTER — Ambulatory Visit (INDEPENDENT_AMBULATORY_CARE_PROVIDER_SITE_OTHER): Payer: Medicare Other | Admitting: Podiatry

## 2020-08-01 ENCOUNTER — Encounter: Payer: Self-pay | Admitting: Podiatry

## 2020-08-01 ENCOUNTER — Other Ambulatory Visit: Payer: Self-pay

## 2020-08-01 DIAGNOSIS — M79609 Pain in unspecified limb: Secondary | ICD-10-CM

## 2020-08-01 DIAGNOSIS — B351 Tinea unguium: Secondary | ICD-10-CM

## 2020-08-01 DIAGNOSIS — M79676 Pain in unspecified toe(s): Secondary | ICD-10-CM | POA: Diagnosis not present

## 2020-08-01 DIAGNOSIS — L608 Other nail disorders: Secondary | ICD-10-CM | POA: Diagnosis not present

## 2020-08-01 NOTE — Progress Notes (Signed)
Complaint:  Visit Type: Patient returns to my office for continued preventative foot care services. Complaint: Patient states" my nails have grown long and thick and become painful to walk and wear shoes"  The patient presents for preventative foot care services. No changes to ROS.   Podiatric Exam: Vascular: dorsalis pedis and posterior tibial pulses are palpable bilateral. Capillary return is immediate. Temperature gradient is WNL. Skin turgor WNL  Sensorium: Normal Semmes Weinstein monofilament test. Normal tactile sensation bilaterally. Nail Exam: Pt has thick disfigured discolored nails with subungual debris noted bilateral entire nail hallux through fifth toenails.  Ingrown toenail medial border hallux  B/L.  Ulcer Exam: There is no evidence of ulcer or pre-ulcerative changes or infection. Orthopedic Exam: Muscle tone and strength are WNL. No limitations in general ROM. No crepitus or effusions noted. HAV  B/L.  Hammer toe second right.   Skin: No Porokeratosis. No infection or ulcers  Diagnosis:  Onychomycosis, , Pain in right toe, pain in left toes,  Pincer nails   Treatment & Plan Procedures and Treatment: Consent by patient was obtained for treatment procedures. The patient understood the discussion of treatment and procedures well. All questions were answered thoroughly reviewed. Debridement of mycotic and hypertrophic toenails, 1 through 5 bilateral and clearing of subungual debris. No ulceration, no infection noted.  Return Visit-Office Procedure: Patient instructed to return to the office for a follow up visit 10 weeks  for continued evaluation and treatment.    Randale Carvalho DPM 

## 2020-09-10 ENCOUNTER — Encounter: Payer: Self-pay | Admitting: Gastroenterology

## 2020-09-10 ENCOUNTER — Ambulatory Visit (INDEPENDENT_AMBULATORY_CARE_PROVIDER_SITE_OTHER): Payer: Medicare Other | Admitting: Gastroenterology

## 2020-09-10 VITALS — BP 150/70 | HR 80 | Ht 67.75 in | Wt 175.0 lb

## 2020-09-10 DIAGNOSIS — Z86010 Personal history of colonic polyps: Secondary | ICD-10-CM | POA: Diagnosis not present

## 2020-09-10 NOTE — Progress Notes (Signed)
HPI: This is a very pleasant 79 year old man who was referred to me by Isaac Bliss, Estel*  to evaluate history of adenomatous colon polyps.    He was last here seen in our office about 10 years ago.  Since then he established GI care in [place].  He is undergone quite a lot of testing.  See those results below.  I reviewed the reports and the images as well as well as the pathology.  Today he is really here to establish care because he moved back to [place].  He has no overt GI bleeding.  He actually feels overall quite well.  He went to the emergency room about 3 months ago because he felt weak and a bit dizzy.  They told him he was dehydrated.  He was Hemoccult negative.  His hemoglobin was 10.8.  He never sees blood in his stool, never sees black stool.  He never has abdominal pains.  His weight is overall stable.  Old Data Reviewed:  EGD September 2018 Dr. [facility], indication melena, findings "a few angio ectasias in the stomach", Schatzki's ring.  A clip was noted in his stomach that was apparently placed about a year prior.  EGD November 2017, Dr. Gaylyn Cheers, [facility], indication melena, findings AVMs in the stomach and duodenum which were nonbleeding.  They were cauterized and then clipped.  He also described gastritis with several cratered gastric ulcers.  Biopsies were not taken from his stomach.  EGD, October 2016 at [facility], follow-up for duodenal adenoma.  Findings 2 cm polyp was found in his duodenum it was removed with saline injection lift technique using a hot snare.  Piecemeal technique.  He recommended a repeat upper endoscopy in 1 year for surveillance.  I am unable to see the pathology report from this  Colonoscopy July 2016, Dr. Aundria Rud, indication iron deficiency anemia, findings 4 subcentimeter polyps were found and removed.  They were all adenomas.  EGD July 2016, Dr. Aundria Rud, indication iron deficiency anemia,  findings 15 mm polyp in the duodenum, biopsies showed this was an adenoma.  Gastritis, H. pylori negative.   Blood work May 2021 CBC hemoglobin 11.8, normocytic; FOB testing negative    Review of systems: Pertinent positive and negative review of systems were noted in the above HPI section. All other review negative.   Past Medical History:  Diagnosis Date  . Anemia   . Basal cell adenocarcinoma 09/03/2008   Qualifier: Diagnosis of  By: Lorelei Pont MD, Frederico Hamman    Overview:  Overview:  Qualifier: Diagnosis of  By: Lorelei Pont MD, Frederico Hamman   . Basal cell carcinoma of lip 2007   multiple sites  . Blood transfusion without reported diagnosis   . ED (erectile dysfunction)   . GERD (gastroesophageal reflux disease)   . HLD (hyperlipidemia)   . Hx of squamous cell carcinoma of skin 08/25/2016   L superior pectoral  . Hypertension   . Osteoarthritis   . Peripheral vascular disease ([place])    Occlusion and Stenosis of Carotid Artery  . Tobacco abuse   . Tubular adenoma of colon 2005  . Wears dentures    full upper and lower    Past Surgical History:  Procedure Laterality Date  . ANKLE ARTHROSCOPY W/ OPEN REPAIR Left   . COLONOSCOPY  2006   [place]-Tubular adenoma  . COLONOSCOPY N/A 07/23/2015   Procedure: COLONOSCOPY;  Surgeon: Lucilla Lame, MD;  Location: [place];  Service: Gastroenterology;  Laterality: N/A;  WITH BIOPSIES---- TRANSVERSE COLON POLYP DESCENDING COLON POLYP  X  4  . ESOPHAGOGASTRODUODENOSCOPY N/A 07/23/2015   Procedure: ESOPHAGOGASTRODUODENOSCOPY (EGD);  Surgeon: Lucilla Lame, MD;  Location: [place];  Service: Gastroenterology;  Laterality: N/A;  WITH BIOPSY--- DUODENAL BIOPSY GASTRIC BIOPSY  . ESOPHAGOGASTRODUODENOSCOPY N/A 10/28/2016   Procedure: ESOPHAGOGASTRODUODENOSCOPY (EGD);  Surgeon: Manya Silvas, MD;  Location[facility] ENDOSCOPY;  Service: Endoscopy;  Laterality: N/A;  . ESOPHAGOGASTRODUODENOSCOPY (EGD) WITH PROPOFOL N/A 09/02/2017   Procedure:  ESOPHAGOGASTRODUODENOSCOPY (EGD) WITH PROPOFOL;  Surgeon: Manya Silvas, MD;  Location: [facility] ENDOSCOPY;  Service: Endoscopy;  Laterality: N/A;  . SKIN CANCER EXCISION     LIP  . TONSILLECTOMY     age 28  . UPPER GI ENDOSCOPY  12/2009   Dr. Ivory Broad gastritisl negative for H. pylori; duodenal biopsies neg for sprue    Current Outpatient Medications  Medication Sig Dispense Refill  . atorvastatin (LIPITOR) 10 MG tablet Take 1 tablet (10 mg total) by mouth daily. 90 tablet 1  . buPROPion (WELLBUTRIN XL) 150 MG 24 hr tablet Take 1 tablet (150 mg total) by mouth daily. 90 tablet 1  . Colchicine (MITIGARE) 0.6 MG CAPS Take 1 tablet by mouth 2 (two) times daily as needed. 60 capsule 3  . Diclofenac Sodium 1.5 % SOLN Apply to small joints up to 4 times a day as needed 450 mL 5  . DIOVAN HCT 320-12.5 MG tablet Take 1 tablet by mouth daily. 90 tablet 1  . esomeprazole (NEXIUM) 20 MG capsule Take 40 mg by mouth 2 (two) times daily before a meal.    . eszopiclone (LUNESTA) 2 MG TABS tablet Take 1 tablet (2 mg total) by mouth at bedtime as needed for sleep. Take immediately before bedtime 90 tablet 1  . loratadine (CLARITIN) 10 MG tablet Take 10 mg by mouth daily as needed for allergies.    . metoprolol succinate (TOPROL-XL) 50 MG 24 hr tablet TAKE 1 TABLET BY MOUTH DAILY. TAKE WITH OR IMMEDIATELY FOLLOWING A MEAL. 30 tablet 3  . Multiple Vitamin (MULTIVITAMIN) tablet Take 1 tablet by mouth daily.     No current facility-administered medications for this visit.    Allergies as of 09/10/2020  . (No Known Allergies)    Family History  Problem Relation Age of Onset  . Lung cancer Mother 2  . Hearing loss Father   . Throat cancer Father   . Heart attack Father   . CAD Father   . Ovarian cancer Sister     Social History   Socioeconomic History  . Marital status: Single    Spouse name: Not on file  . Number of children: 2  . Years of education: Not on file  . Highest education  level: Not on file  Occupational History  . Occupation: retired Building surveyor: RETIRED  Tobacco Use  . Smoking status: Former Smoker    Packs/day: 0.50    Years: 40.00    Pack years: 20.00    Types: Cigarettes    Quit date: 03/10/2020    Years since quitting: 0.5  . Smokeless tobacco: Never Used  . Tobacco comment: cigars occassionally  Vaping Use  . Vaping Use: Never used  Substance and Sexual Activity  . Alcohol use: Yes    Alcohol/week: 14.0 standard drinks    Types: 14 Glasses of wine per week    Comment: 2 glasses of wine daily  . Drug use: No  . Sexual  activity: Not Currently  Other Topics Concern  . Not on file  Social History Narrative   Lives alone   Social Determinants of Health   Financial Resource Strain:   . Difficulty of Paying Living Expenses: Not on file  Food Insecurity:   . Worried About Charity fundraiser in the Last Year: Not on file  . Ran Out of Food in the Last Year: Not on file  Transportation Needs:   . Lack of Transportation (Medical): Not on file  . Lack of Transportation (Non-Medical): Not on file  Physical Activity:   . Days of Exercise per Week: Not on file  . Minutes of Exercise per Session: Not on file  Stress:   . Feeling of Stress : Not on file  Social Connections:   . Frequency of Communication with Friends and Family: Not on file  . Frequency of Social Gatherings with Friends and Family: Not on file  . Attends Religious Services: Not on file  . Active Member of Clubs or Organizations: Not on file  . Attends Archivist Meetings: Not on file  . Marital Status: Not on file  Intimate Partner Violence:   . Fear of Current or Ex-Partner: Not on file  . Emotionally Abused: Not on file  . Physically Abused: Not on file  . Sexually Abused: Not on file     Physical Exam: BP (!) 150/70 (BP Location: Left Arm, Patient Position: Sitting, Cuff Size: Normal)   Pulse 80   Ht 5' 7.75" (1.721 m) Comment: height  measured without shoes  Wt 175 lb (79.4 kg)   BMI 26.81 kg/m  Constitutional: generally well-appearing Psychiatric: alert and oriented x3 Eyes: extraocular movements intact Mouth: oral pharynx moist, no lesions Neck: supple no lymphadenopathy Cardiovascular: heart regular rate and rhythm Lungs: clear to auscultation bilaterally Abdomen: soft, nontender, nondistended, no obvious ascites, no peritoneal signs, normal bowel sounds Extremities: no lower extremity edema bilaterally Skin: no lesions on visible extremities   Assessment and plan: 79 y.o. male with personal history of adenomatous colon polyps  We discussed polyp surveillance guidelines and I explained to him that generally guidelines say a repeat colonoscopy 3 to 5 years after 4 subcentimeter adenomas is reasonable.  He just turned 79 today and he is not sure that he would like to go through with any testing unless it is absolutely necessary.  I think that is perfectly reasonable and I explained to him that colonoscopy for polyp surveillance is certainly not an emergency and it is not absolutely necessary.  Instead he is going to define his stools and if he sees any overt bleeding or has any other significant GI issues he will call at that time.  Please see the "Patient Instructions" section for addition details about the plan.   Owens Loffler, MD [facility] 09/10/2020, 10:31 AM  Cc: Isaac Bliss, Estel* Total time on date of encounter was 45  minutes (this included time spent preparing to see the patient reviewing records; obtaining and/or reviewing separately obtained history; performing a medically appropriate exam and/or evaluation; counseling and educating the patient and family if present; ordering medications, tests or procedures if applicable; and documenting clinical information in the health record).

## 2020-09-10 NOTE — Patient Instructions (Signed)
If you are age 80 or older, your body mass index should be between 23-30. Your Body mass index is 26.81 kg/m. If this is out of the aforementioned range listed, please consider follow up with your Primary Care Provider.  If you are age 10 or younger, your body mass index should be between 19-25. Your Body mass index is 26.81 kg/m. If this is out of the aformentioned range listed, please consider follow up with your Primary Care Provider.   You will follow up with our office on an as needed basis or if symptoms worsen or fail to improve.  Thank you for entrusting me with your care and choosing [facility].  Dr Ardis Hughs

## 2020-09-23 DIAGNOSIS — Z23 Encounter for immunization: Secondary | ICD-10-CM | POA: Diagnosis not present

## 2020-10-14 ENCOUNTER — Other Ambulatory Visit: Payer: Self-pay | Admitting: Internal Medicine

## 2020-10-14 ENCOUNTER — Inpatient Hospital Stay: Admission: RE | Admit: 2020-10-14 | Payer: Self-pay | Source: Ambulatory Visit

## 2020-10-14 ENCOUNTER — Other Ambulatory Visit: Payer: Self-pay

## 2020-10-14 ENCOUNTER — Ambulatory Visit (INDEPENDENT_AMBULATORY_CARE_PROVIDER_SITE_OTHER): Payer: Medicare Other

## 2020-10-14 ENCOUNTER — Ambulatory Visit (INDEPENDENT_AMBULATORY_CARE_PROVIDER_SITE_OTHER): Payer: Medicare Other | Admitting: Internal Medicine

## 2020-10-14 ENCOUNTER — Encounter: Payer: Self-pay | Admitting: Internal Medicine

## 2020-10-14 VITALS — BP 130/70 | HR 71 | Temp 98.2°F | Wt 177.9 lb

## 2020-10-14 DIAGNOSIS — M25551 Pain in right hip: Secondary | ICD-10-CM | POA: Diagnosis not present

## 2020-10-14 DIAGNOSIS — I10 Essential (primary) hypertension: Secondary | ICD-10-CM

## 2020-10-14 DIAGNOSIS — E7800 Pure hypercholesterolemia, unspecified: Secondary | ICD-10-CM

## 2020-10-14 DIAGNOSIS — I6523 Occlusion and stenosis of bilateral carotid arteries: Secondary | ICD-10-CM

## 2020-10-14 DIAGNOSIS — F1010 Alcohol abuse, uncomplicated: Secondary | ICD-10-CM

## 2020-10-14 DIAGNOSIS — M1611 Unilateral primary osteoarthritis, right hip: Secondary | ICD-10-CM | POA: Diagnosis not present

## 2020-10-14 DIAGNOSIS — R7303 Prediabetes: Secondary | ICD-10-CM

## 2020-10-14 IMAGING — DX DG HIP (WITH OR WITHOUT PELVIS) 2-3V*R*
2 series · 2 of 2 positions shown · non-contrast
Comparison: None.

CLINICAL DATA: Right hip pain x2 weeks.

EXAM:
DG HIP (WITH OR WITHOUT PELVIS) 2-3V RIGHT

[pelvis ap]
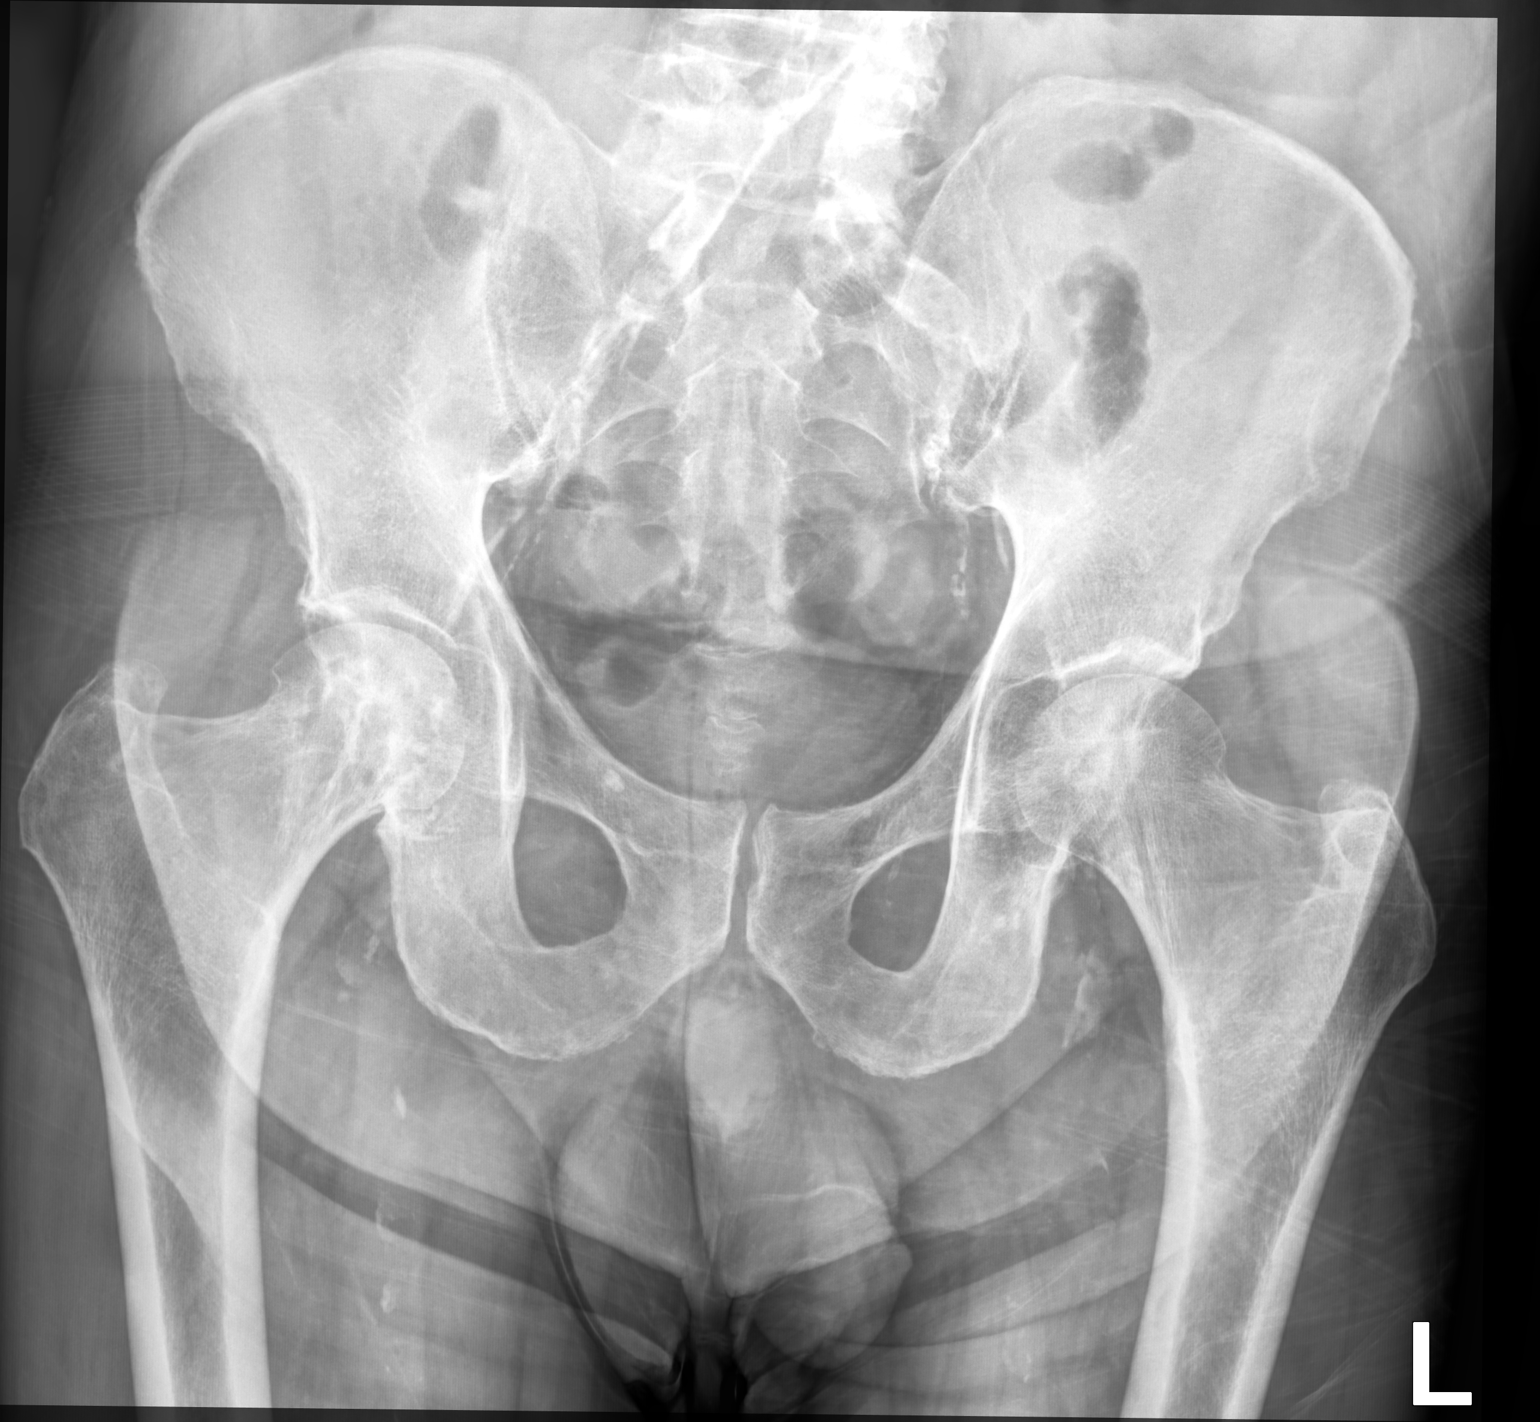

[hip joint ap]
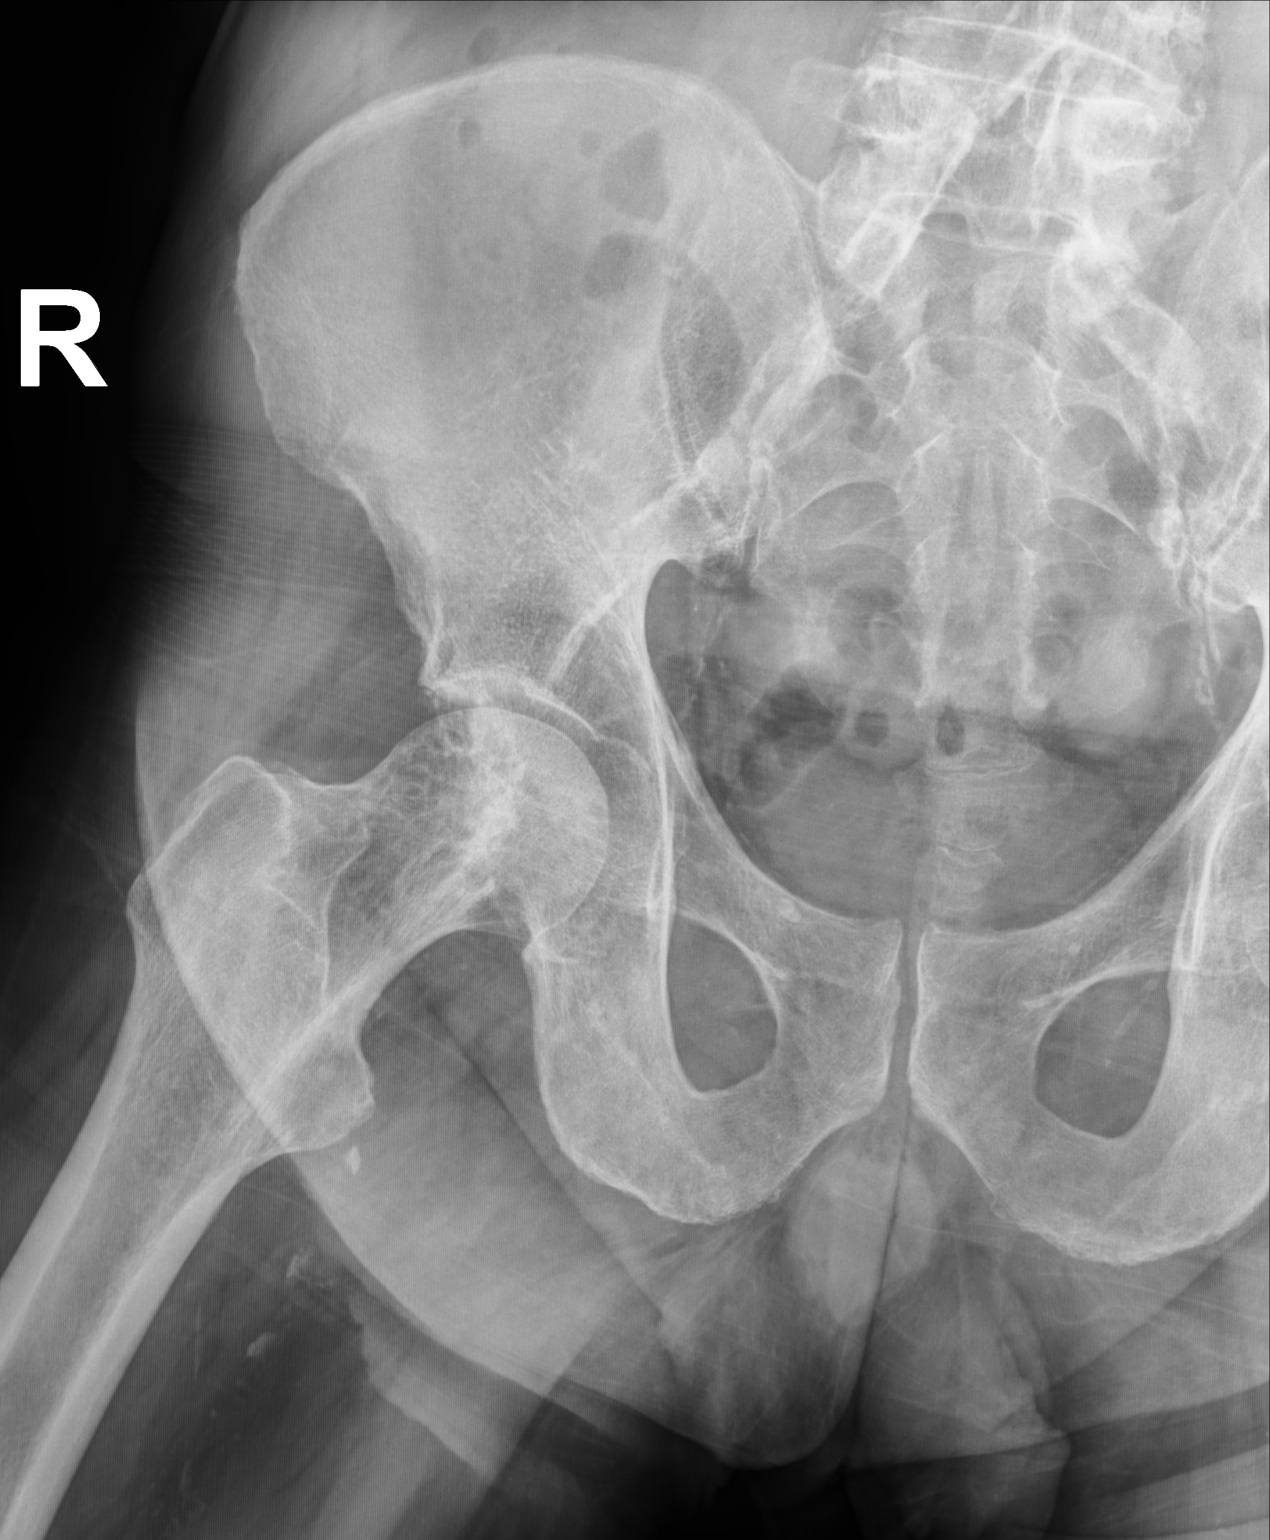

[2 of 2 positions shown; findings below may reference images not displayed]

FINDINGS: There is no evidence of hip fracture or dislocation. Moderate
severity degenerative changes seen involving the bilateral hips.
This is seen in the form of joint space narrowing and acetabular
sclerosis. Chronic curvilinear sclerosis and subcortical cysts are
seen involving the right humeral head. Marked severity vascular
calcification is noted.
IMPRESSION: Moderate severity degenerative changes involving the bilateral hips.

## 2020-10-14 NOTE — Patient Instructions (Signed)
-  Nice seeing you today!!  -Hip xray today.  -Schedule follow up in 6 months for your physical. Please come in fasting that day.

## 2020-10-14 NOTE — Progress Notes (Signed)
Established Patient Office Visit     This visit occurred during the SARS-CoV-2 public health emergency.  Safety protocols were in place, including screening questions prior to the visit, additional usage of staff PPE, and extensive cleaning of exam room while observing appropriate contact time as indicated for disinfecting solutions.    CC/Reason for Visit: Follow-up chronic medical conditions, discuss right hip pain  HPI: Devin Garza is a 79 y.o. male who is coming in today for the above mentioned reasons. Past Medical History is significant for: Carotid artery disease, hypertension, GERD, impaired glucose tolerance, hyperlipidemia, insomnia, history of alcohol abuse and tobacco abuse.  He has not smoked in 6 months.  He continues to drink about 2 glasses of wine every night.  He has been feeling some significant right hip pain.  He is relying heavily on his cane.  This is new.  He has never had hip x-rays.  I do note a history of gout and instruct.  He does not recall his last gout flareup.   Past Medical/Surgical History: Past Medical History:  Diagnosis Date  . Anemia   . Basal cell adenocarcinoma 09/03/2008   Qualifier: Diagnosis of  By: Lorelei Pont MD, Frederico Hamman    Overview:  Overview:  Qualifier: Diagnosis of  By: Lorelei Pont MD, Frederico Hamman   . Basal cell carcinoma of lip 2007   multiple sites  . Blood transfusion without reported diagnosis   . ED (erectile dysfunction)   . GERD (gastroesophageal reflux disease)   . HLD (hyperlipidemia)   . Hx of squamous cell carcinoma of skin 08/25/2016   L superior pectoral  . Hypertension   . Osteoarthritis   . Peripheral vascular disease ([place])    Occlusion and Stenosis of Carotid Artery  . Tobacco abuse   . Tubular adenoma of colon 2005  . Wears dentures    full upper and lower    Past Surgical History:  Procedure Laterality Date  . ANKLE ARTHROSCOPY W/ OPEN REPAIR Left   . COLONOSCOPY  2006   [place]-Tubular adenoma  . COLONOSCOPY N/A  07/23/2015   Procedure: COLONOSCOPY;  Surgeon: Lucilla Lame, MD;  Location: [facility];  Service: Gastroenterology;  Laterality: N/A;  WITH BIOPSIES---- TRANSVERSE COLON POLYP DESCENDING COLON POLYP  X  4  . ESOPHAGOGASTRODUODENOSCOPY N/A 07/23/2015   Procedure: ESOPHAGOGASTRODUODENOSCOPY (EGD);  Surgeon: Lucilla Lame, MD;  Location: [facility];  Service: Gastroenterology;  Laterality: N/A;  WITH BIOPSY--- DUODENAL BIOPSY GASTRIC BIOPSY  . ESOPHAGOGASTRODUODENOSCOPY N/A 10/28/2016   Procedure: ESOPHAGOGASTRODUODENOSCOPY (EGD);  Surgeon: Manya Silvas, MD;  Location: [facility] ENDOSCOPY;  Service: Endoscopy;  Laterality: N/A;  . ESOPHAGOGASTRODUODENOSCOPY (EGD) WITH PROPOFOL N/A 09/02/2017   Procedure: ESOPHAGOGASTRODUODENOSCOPY (EGD) WITH PROPOFOL;  Surgeon: Manya Silvas, MD;  Location: [facility] ENDOSCOPY;  Service: Endoscopy;  Laterality: N/A;  . SKIN CANCER EXCISION     LIP  . TONSILLECTOMY     age 61  . UPPER GI ENDOSCOPY  12/2009   Dr. Ivory Broad gastritisl negative for H. pylori; duodenal biopsies neg for sprue    Social History:  reports that he quit smoking about 7 months ago. His smoking use included cigarettes. He has a 20.00 pack-year smoking history. He has never used smokeless tobacco. He reports current alcohol use of about 14.0 standard drinks of alcohol per week. He reports that he does not use drugs.  Allergies: No Known Allergies  Family History:  Family History  Problem Relation Age of Onset  . Lung cancer Mother 52  .  Hearing loss Father   . Throat cancer Father   . Heart attack Father   . CAD Father   . Ovarian cancer Sister      Current Outpatient Medications:  .  atorvastatin (LIPITOR) 10 MG tablet, Take 1 tablet (10 mg total) by mouth daily., Disp: 90 tablet, Rfl: 1 .  buPROPion (WELLBUTRIN XL) 150 MG 24 hr tablet, Take 1 tablet (150 mg total) by mouth daily., Disp: 90 tablet, Rfl: 1 .  Colchicine (MITIGARE) 0.6 MG CAPS, Take 1 tablet by  mouth 2 (two) times daily as needed., Disp: 60 capsule, Rfl: 3 .  Diclofenac Sodium 1.5 % SOLN, Apply to small joints up to 4 times a day as needed, Disp: 450 mL, Rfl: 5 .  DIOVAN HCT 320-12.5 MG tablet, Take 1 tablet by mouth daily., Disp: 90 tablet, Rfl: 1 .  esomeprazole (NEXIUM) 20 MG capsule, Take 40 mg by mouth 2 (two) times daily before a meal., Disp: , Rfl:  .  eszopiclone (LUNESTA) 2 MG TABS tablet, Take 1 tablet (2 mg total) by mouth at bedtime as needed for sleep. Take immediately before bedtime, Disp: 90 tablet, Rfl: 1 .  loratadine (CLARITIN) 10 MG tablet, Take 10 mg by mouth daily as needed for allergies., Disp: , Rfl:  .  metoprolol succinate (TOPROL-XL) 50 MG 24 hr tablet, TAKE 1 TABLET BY MOUTH DAILY. TAKE WITH OR IMMEDIATELY FOLLOWING A MEAL., Disp: 30 tablet, Rfl: 3 .  Multiple Vitamin (MULTIVITAMIN) tablet, Take 1 tablet by mouth daily., Disp: , Rfl:   Review of Systems:  Constitutional: Denies fever, chills, diaphoresis, appetite change and fatigue.  HEENT: Denies photophobia, eye pain, redness, hearing loss, ear pain, congestion, sore throat, rhinorrhea, sneezing, mouth sores, trouble swallowing, neck pain, neck stiffness and tinnitus.   Respiratory: Denies SOB, DOE, cough, chest tightness,  and wheezing.   Cardiovascular: Denies chest pain, palpitations and leg swelling.  Gastrointestinal: Denies nausea, vomiting, abdominal pain, diarrhea, constipation, blood in stool and abdominal distention.  Genitourinary: Denies dysuria, urgency, frequency, hematuria, flank pain and difficulty urinating.  Endocrine: Denies: hot or cold intolerance, sweats, changes in hair or nails, polyuria, polydipsia. Musculoskeletal: Denies back pain. Skin: Denies pallor, rash and wound.  Neurological: Denies dizziness, seizures, syncope, weakness, light-headedness, numbness and headaches.  Hematological: Denies adenopathy. Easy bruising, personal or family bleeding history  Psychiatric/Behavioral:  Denies suicidal ideation, mood changes, confusion, nervousness, sleep disturbance and agitation    Physical Exam: Vitals:   10/14/20 1013  BP: 130/70  Pulse: 71  Temp: 98.2 F (36.8 C)  TempSrc: Oral  SpO2: 94%  Weight: 177 lb 14.4 oz (80.7 kg)    Body mass index is 27.25 kg/m.   Constitutional: NAD, calm, ambulates with unsteady gait, ambulates with a cane Eyes: PERRL, lids and conjunctivae normal, wears corrective lenses ENMT: Mucous membranes are moist. Respiratory: clear to auscultation bilaterally, no wheezing, no crackles. Normal respiratory effort. No accessory muscle use.  Cardiovascular: Regular rate and rhythm, no murmurs / rubs / gallops. No extremity edema.  Psychiatric: Normal judgment and insight. Alert and oriented x 3. Normal mood.    Impression and Plan:  Right hip pain  -Right hip x-rays. -Given his history of gout wonder if this could be an acute flareup. -For now, await results of x-ray, he may do OTC pain medication as needed.  Pure hypercholesterolemia -Last LDL was 93 in February 2021, he is on atorvastatin 20 mg daily.  Essential hypertension -Blood pressures well controlled today.  Alcohol abuse -  He continues to drink 2 to 3 glasses a night, B12 level was normal in May.  Prediabetes -Last A1c was 5.2, continue to follow in 6 months.  Bilateral carotid artery stenosis -Carotid Dopplers done in July 2021 with a 40 to 59% left and right ICA stenosis. -Recommendation is for yearly follow-up.   Patient Instructions  -Nice seeing you today!!  -Hip xray today.  -Schedule follow up in 6 months for your physical. Please come in fasting that day.     Lelon Frohlich, MD [facility]

## 2020-10-14 NOTE — Addendum Note (Signed)
Addended by: Marrion Coy on: 10/14/2020 11:48 AM   Modules accepted: Orders

## 2020-10-14 NOTE — Addendum Note (Signed)
Addended by: Marrion Coy on: 10/14/2020 11:38 AM   Modules accepted: Orders

## 2020-10-15 ENCOUNTER — Emergency Department (HOSPITAL_COMMUNITY): Payer: Medicare Other

## 2020-10-15 ENCOUNTER — Observation Stay (HOSPITAL_COMMUNITY): Payer: Medicare Other

## 2020-10-15 ENCOUNTER — Encounter (HOSPITAL_COMMUNITY): Payer: Self-pay

## 2020-10-15 ENCOUNTER — Other Ambulatory Visit: Payer: Self-pay

## 2020-10-15 ENCOUNTER — Inpatient Hospital Stay (HOSPITAL_COMMUNITY)
Admission: EM | Admit: 2020-10-15 | Discharge: 2020-10-22 | DRG: 101 | Disposition: A | Discharge status: Skilled Nursing Facility | Payer: Medicare Other | Attending: Family Medicine | Admitting: Family Medicine

## 2020-10-15 DIAGNOSIS — M160 Bilateral primary osteoarthritis of hip: Secondary | ICD-10-CM | POA: Diagnosis not present

## 2020-10-15 DIAGNOSIS — Z808 Family history of malignant neoplasm of other organs or systems: Secondary | ICD-10-CM

## 2020-10-15 DIAGNOSIS — R339 Retention of urine, unspecified: Secondary | ICD-10-CM | POA: Diagnosis present

## 2020-10-15 DIAGNOSIS — R338 Other retention of urine: Secondary | ICD-10-CM | POA: Diagnosis present

## 2020-10-15 DIAGNOSIS — M25551 Pain in right hip: Secondary | ICD-10-CM | POA: Diagnosis present

## 2020-10-15 DIAGNOSIS — E782 Mixed hyperlipidemia: Secondary | ICD-10-CM | POA: Diagnosis not present

## 2020-10-15 DIAGNOSIS — M109 Gout, unspecified: Secondary | ICD-10-CM | POA: Diagnosis present

## 2020-10-15 DIAGNOSIS — R739 Hyperglycemia, unspecified: Secondary | ICD-10-CM | POA: Diagnosis present

## 2020-10-15 DIAGNOSIS — I708 Atherosclerosis of other arteries: Secondary | ICD-10-CM | POA: Diagnosis not present

## 2020-10-15 DIAGNOSIS — F1011 Alcohol abuse, in remission: Secondary | ICD-10-CM | POA: Diagnosis present

## 2020-10-15 DIAGNOSIS — I517 Cardiomegaly: Secondary | ICD-10-CM | POA: Diagnosis not present

## 2020-10-15 DIAGNOSIS — I10 Essential (primary) hypertension: Secondary | ICD-10-CM | POA: Diagnosis present

## 2020-10-15 DIAGNOSIS — E876 Hypokalemia: Secondary | ICD-10-CM | POA: Diagnosis not present

## 2020-10-15 DIAGNOSIS — R531 Weakness: Secondary | ICD-10-CM | POA: Diagnosis not present

## 2020-10-15 DIAGNOSIS — W19XXXA Unspecified fall, initial encounter: Secondary | ICD-10-CM | POA: Diagnosis present

## 2020-10-15 DIAGNOSIS — L8990 Pressure ulcer of unspecified site, unspecified stage: Secondary | ICD-10-CM | POA: Insufficient documentation

## 2020-10-15 DIAGNOSIS — I712 Thoracic aortic aneurysm, without rupture: Secondary | ICD-10-CM | POA: Diagnosis not present

## 2020-10-15 DIAGNOSIS — R7303 Prediabetes: Secondary | ICD-10-CM | POA: Diagnosis present

## 2020-10-15 DIAGNOSIS — R55 Syncope and collapse: Secondary | ICD-10-CM | POA: Diagnosis not present

## 2020-10-15 DIAGNOSIS — R29818 Other symptoms and signs involving the nervous system: Secondary | ICD-10-CM | POA: Diagnosis not present

## 2020-10-15 DIAGNOSIS — R569 Unspecified convulsions: Secondary | ICD-10-CM | POA: Diagnosis not present

## 2020-10-15 DIAGNOSIS — E875 Hyperkalemia: Secondary | ICD-10-CM | POA: Diagnosis present

## 2020-10-15 DIAGNOSIS — Z20822 Contact with and (suspected) exposure to covid-19: Secondary | ICD-10-CM | POA: Diagnosis present

## 2020-10-15 DIAGNOSIS — M1611 Unilateral primary osteoarthritis, right hip: Secondary | ICD-10-CM | POA: Diagnosis not present

## 2020-10-15 DIAGNOSIS — K219 Gastro-esophageal reflux disease without esophagitis: Secondary | ICD-10-CM | POA: Diagnosis not present

## 2020-10-15 DIAGNOSIS — F1010 Alcohol abuse, uncomplicated: Secondary | ICD-10-CM | POA: Diagnosis present

## 2020-10-15 DIAGNOSIS — K31819 Angiodysplasia of stomach and duodenum without bleeding: Secondary | ICD-10-CM | POA: Diagnosis present

## 2020-10-15 DIAGNOSIS — Z8041 Family history of malignant neoplasm of ovary: Secondary | ICD-10-CM

## 2020-10-15 DIAGNOSIS — Z85038 Personal history of other malignant neoplasm of large intestine: Secondary | ICD-10-CM

## 2020-10-15 DIAGNOSIS — L89151 Pressure ulcer of sacral region, stage 1: Secondary | ICD-10-CM | POA: Diagnosis present

## 2020-10-15 DIAGNOSIS — Z79899 Other long term (current) drug therapy: Secondary | ICD-10-CM

## 2020-10-15 DIAGNOSIS — Z85828 Personal history of other malignant neoplasm of skin: Secondary | ICD-10-CM

## 2020-10-15 DIAGNOSIS — Z801 Family history of malignant neoplasm of trachea, bronchus and lung: Secondary | ICD-10-CM

## 2020-10-15 DIAGNOSIS — J189 Pneumonia, unspecified organism: Secondary | ICD-10-CM

## 2020-10-15 DIAGNOSIS — Z87891 Personal history of nicotine dependence: Secondary | ICD-10-CM

## 2020-10-15 DIAGNOSIS — M1990 Unspecified osteoarthritis, unspecified site: Secondary | ICD-10-CM | POA: Diagnosis present

## 2020-10-15 DIAGNOSIS — I700 Atherosclerosis of aorta: Secondary | ICD-10-CM | POA: Diagnosis not present

## 2020-10-15 DIAGNOSIS — I739 Peripheral vascular disease, unspecified: Secondary | ICD-10-CM | POA: Diagnosis present

## 2020-10-15 DIAGNOSIS — Z8249 Family history of ischemic heart disease and other diseases of the circulatory system: Secondary | ICD-10-CM

## 2020-10-15 LAB — COMPREHENSIVE METABOLIC PANEL
ALT: 35 U/L (ref 0–44)
AST: 48 U/L — ABNORMAL HIGH (ref 15–41)
Albumin: 3.7 g/dL (ref 3.5–5.0)
Alkaline Phosphatase: 45 U/L (ref 38–126)
Anion gap: 20 — ABNORMAL HIGH (ref 5–15)
BUN: 10 mg/dL (ref 8–23)
CO2: 18 mmol/L — ABNORMAL LOW (ref 22–32)
Calcium: 6.7 mg/dL — ABNORMAL LOW (ref 8.9–10.3)
Chloride: 99 mmol/L (ref 98–111)
Creatinine, Ser: 1.11 mg/dL (ref 0.61–1.24)
GFR, Estimated: >60 mL/min (ref >60)
Glucose, Bld: 183 mg/dL — ABNORMAL HIGH (ref 70–99)
Potassium: 2.7 mmol/L — CL (ref 3.5–5.1)
Sodium: 137 mmol/L (ref 135–145)
Total Bilirubin: 0.8 mg/dL (ref 0.3–1.2)
Total Protein: 6.8 g/dL (ref 6.5–8.1)

## 2020-10-15 LAB — APTT: aPTT: 31 seconds (ref 24–36)

## 2020-10-15 LAB — I-STAT VENOUS BLOOD GAS, ED
Acid-base deficit: 1 mmol/L (ref 0.0–2.0)
Bicarbonate: 20.4 mmol/L (ref 20.0–28.0)
Calcium, Ion: 0.69 mmol/L — CL (ref 1.15–1.40)
HCT: 36 % — ABNORMAL LOW (ref 39.0–52.0)
Hemoglobin: 12.2 g/dL — ABNORMAL LOW (ref 13.0–17.0)
O2 Saturation: 99 %
Potassium: 2.7 mmol/L — CL (ref 3.5–5.1)
Sodium: 137 mmol/L (ref 135–145)
TCO2: 21 mmol/L — ABNORMAL LOW (ref 22–32)
pCO2, Ven: 25.7 mmHg — ABNORMAL LOW (ref 44.0–60.0)
pH, Ven: 7.508 — ABNORMAL HIGH (ref 7.250–7.430)
pO2, Ven: 109 mmHg — ABNORMAL HIGH (ref 32.0–45.0)

## 2020-10-15 LAB — DIFFERENTIAL
Abs Immature Granulocytes: 0.03 10*3/uL (ref 0.00–0.07)
Basophils Absolute: 0.1 10*3/uL (ref 0.0–0.1)
Basophils Relative: 1 %
Eosinophils Absolute: 0.2 10*3/uL (ref 0.0–0.5)
Eosinophils Relative: 3 %
Immature Granulocytes: 0 %
Lymphocytes Relative: 17 %
Lymphs Abs: 1.1 10*3/uL (ref 0.7–4.0)
Monocytes Absolute: 0.8 10*3/uL (ref 0.1–1.0)
Monocytes Relative: 12 %
Neutro Abs: 4.5 10*3/uL (ref 1.7–7.7)
Neutrophils Relative %: 67 %

## 2020-10-15 LAB — RESPIRATORY PANEL BY RT PCR (FLU A&B, COVID)
Influenza A by PCR: NEGATIVE
Influenza B by PCR: NEGATIVE
SARS Coronavirus 2 by RT PCR: NEGATIVE

## 2020-10-15 LAB — I-STAT CHEM 8, ED
BUN: 9 mg/dL (ref 8–23)
Calcium, Ion: 0.71 mmol/L — CL (ref 1.15–1.40)
Chloride: 100 mmol/L (ref 98–111)
Creatinine, Ser: 1 mg/dL (ref 0.61–1.24)
Glucose, Bld: 180 mg/dL — ABNORMAL HIGH (ref 70–99)
HCT: 36 % — ABNORMAL LOW (ref 39.0–52.0)
Hemoglobin: 12.2 g/dL — ABNORMAL LOW (ref 13.0–17.0)
Potassium: 2.6 mmol/L — CL (ref 3.5–5.1)
Sodium: 137 mmol/L (ref 135–145)
TCO2: 18 mmol/L — ABNORMAL LOW (ref 22–32)

## 2020-10-15 LAB — URINALYSIS, COMPLETE (UACMP) WITH MICROSCOPIC
Bilirubin Urine: NEGATIVE
Glucose, UA: NEGATIVE mg/dL
Ketones, ur: 20 mg/dL — AB
Leukocytes,Ua: NEGATIVE
Nitrite: NEGATIVE
Protein, ur: 30 mg/dL — AB
Specific Gravity, Urine: 1.011 (ref 1.005–1.030)
pH: 5 (ref 5.0–8.0)

## 2020-10-15 LAB — MAGNESIUM: Magnesium: 0.2 mg/dL — CL (ref 1.7–2.4)

## 2020-10-15 LAB — PROTIME-INR
INR: 1.2 (ref 0.8–1.2)
Prothrombin Time: 15.2 seconds (ref 11.4–15.2)

## 2020-10-15 LAB — CBC
HCT: 36.1 % — ABNORMAL LOW (ref 39.0–52.0)
Hemoglobin: 12.1 g/dL — ABNORMAL LOW (ref 13.0–17.0)
MCH: 31.1 pg (ref 26.0–34.0)
MCHC: 33.5 g/dL (ref 30.0–36.0)
MCV: 92.8 fL (ref 80.0–100.0)
Platelets: 267 10*3/uL (ref 150–400)
RBC: 3.89 MIL/uL — ABNORMAL LOW (ref 4.22–5.81)
RDW: 13 % (ref 11.5–15.5)
WBC: 6.8 10*3/uL (ref 4.0–10.5)
nRBC: 0 % (ref 0.0–0.2)

## 2020-10-15 LAB — CBG MONITORING, ED: Glucose-Capillary: 172 mg/dL — ABNORMAL HIGH (ref 70–99)

## 2020-10-15 IMAGING — DX DG CHEST 1V
1 series · 1 of 1 positions shown · non-contrast
Comparison: [DATE]

CLINICAL DATA: Evaluate for pneumonia

EXAM:
CHEST  1 VIEW

[chest]
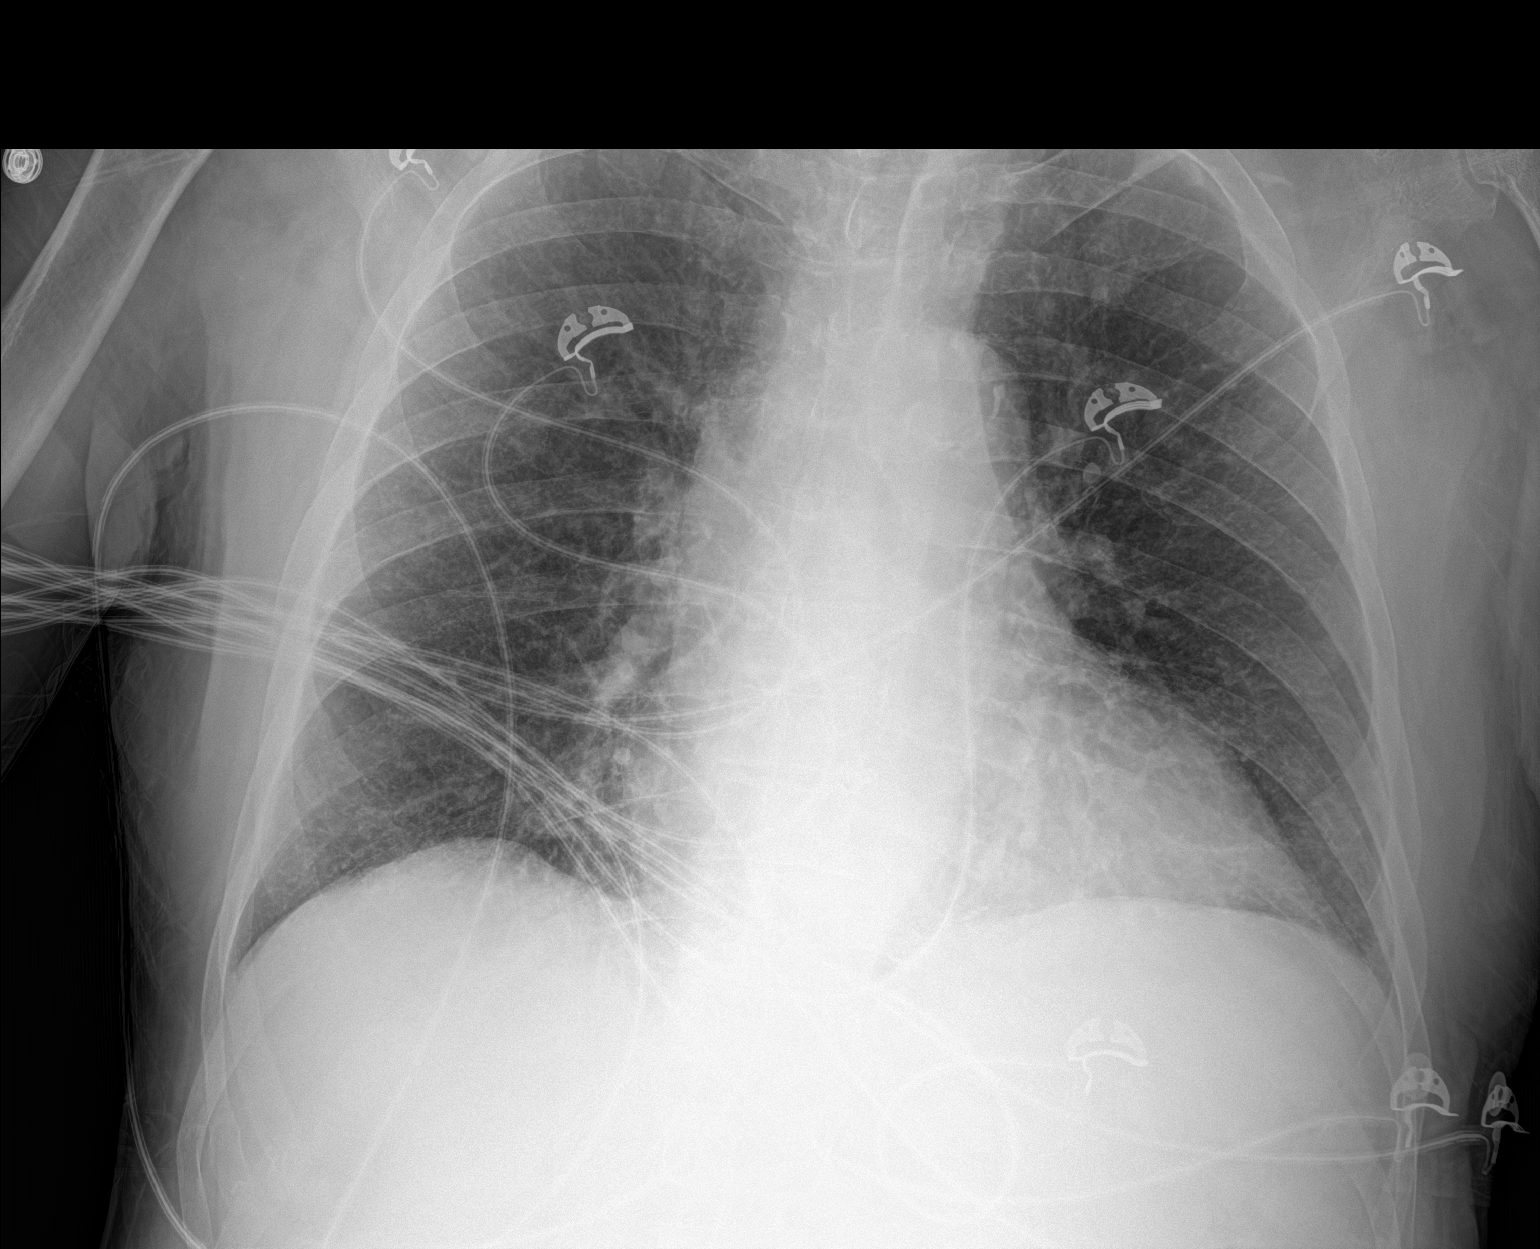

[1 of 1 positions shown; findings below may reference images not displayed]

FINDINGS: Cardiac shadow is mildly enlarged but stable. Aortic calcifications
are seen. Lungs are well aerated bilaterally. No focal infiltrate or
sizable effusion is seen. No bony abnormality is noted.
IMPRESSION: No active disease.

## 2020-10-15 IMAGING — CT CT HEAD CODE STROKE
3 of 4 series · 14 of 47 positions shown, 16 images · non-contrast
Comparison: None available.

CLINICAL DATA: Code stroke. Initial evaluation for acute left-sided
weakness.

EXAM:
CT HEAD WITHOUT CONTRAST
TECHNIQUE: Contiguous axial images were obtained from the base of the skull
through the vertex without intravenous contrast.

[Series 3: head 5.0 st · axial · 0.45mm/px · z∈[-58,+77]mm · 8 of 33 slices shown, 10 images]
[im 3/33  brain]
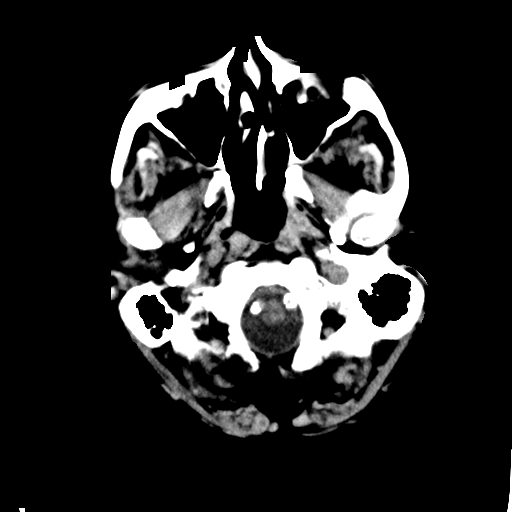
[im 3/33  bone]
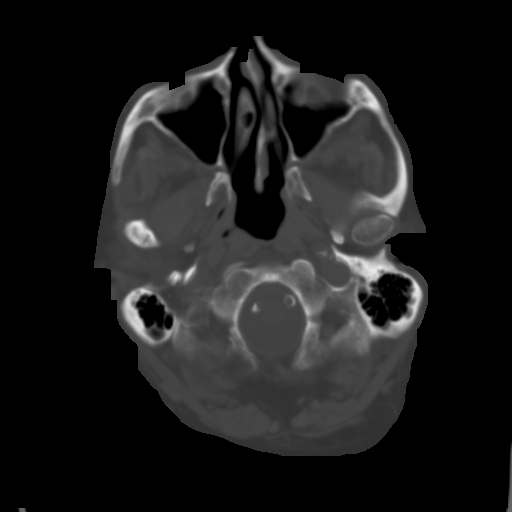
[im 7/33  brain]
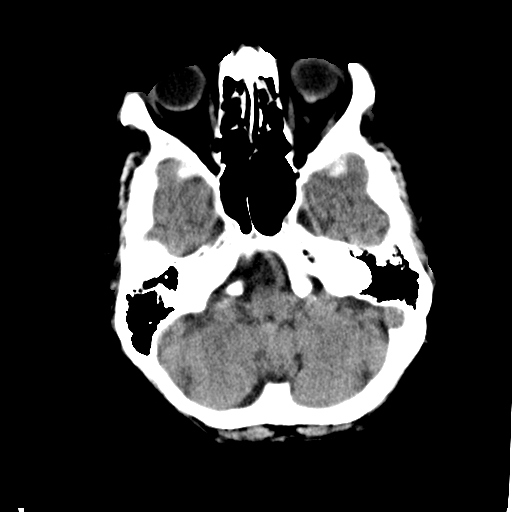
[im 10/33  brain]
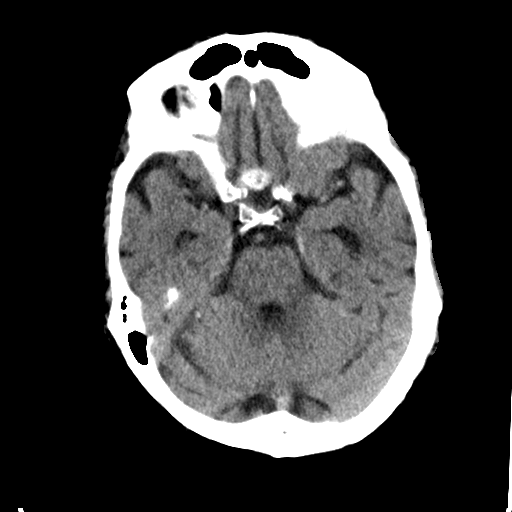
[im 15/33  brain]
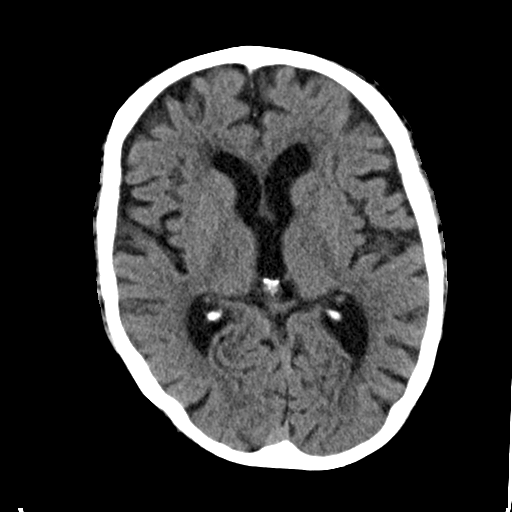
[im 18/33  brain]
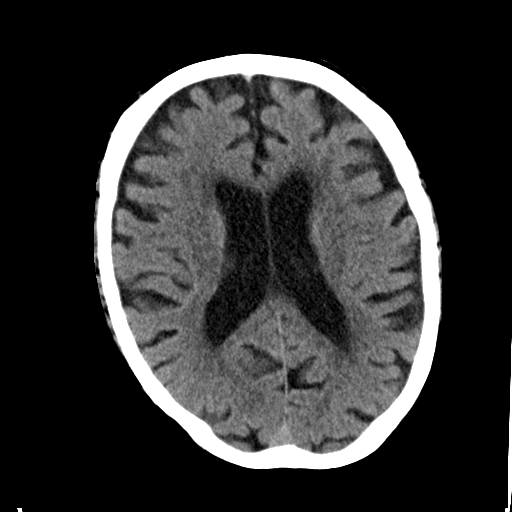
[im 18/33  bone]
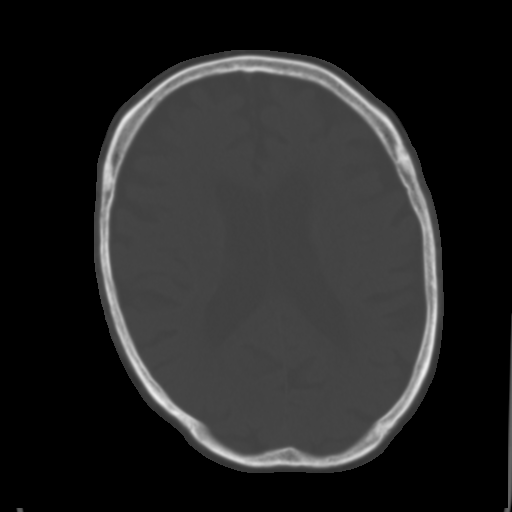
[im 23/33  brain]
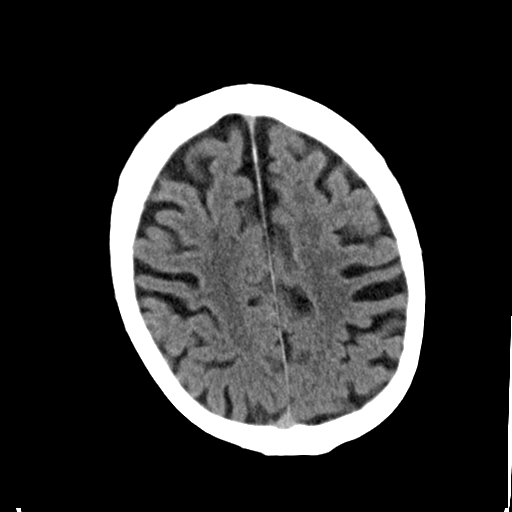
[im 26/33  brain]
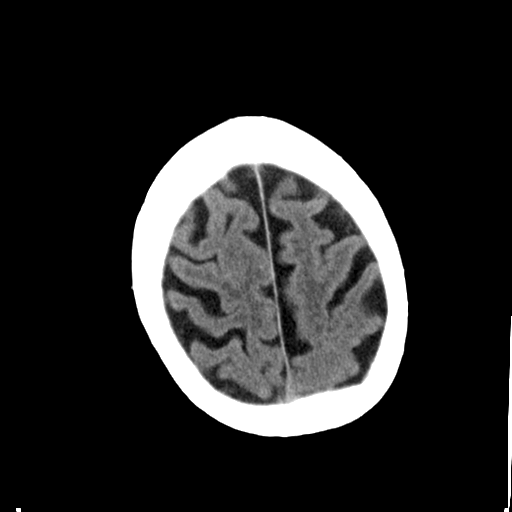
[im 30/33  brain]
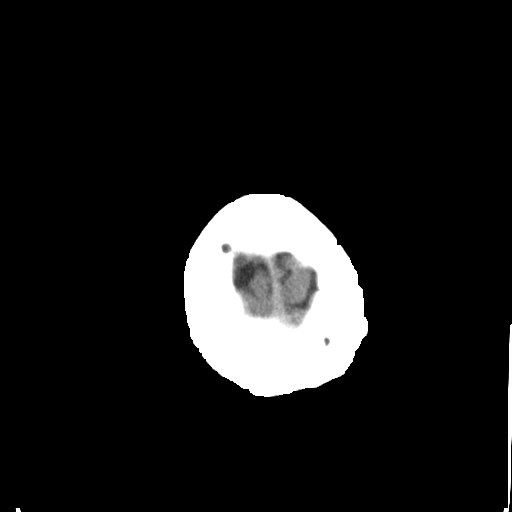

[Series 5: head 3.0 cor st · coronal · 0.23mm/px · 3 of 72 slices shown]
[im 18/72  brain]
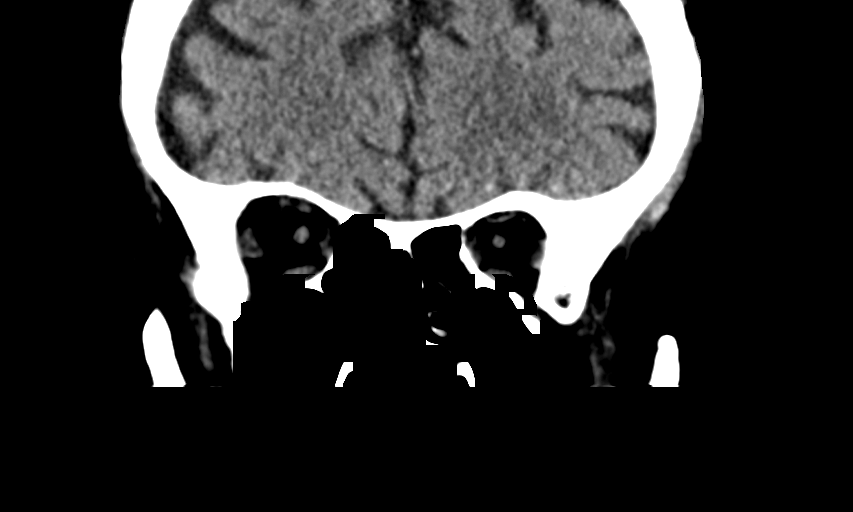
[im 36/72  brain]
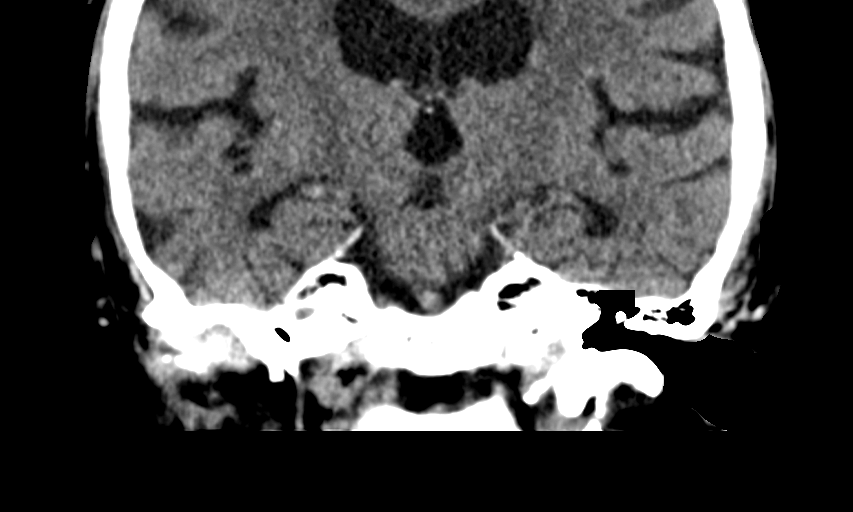
[im 54/72  brain]
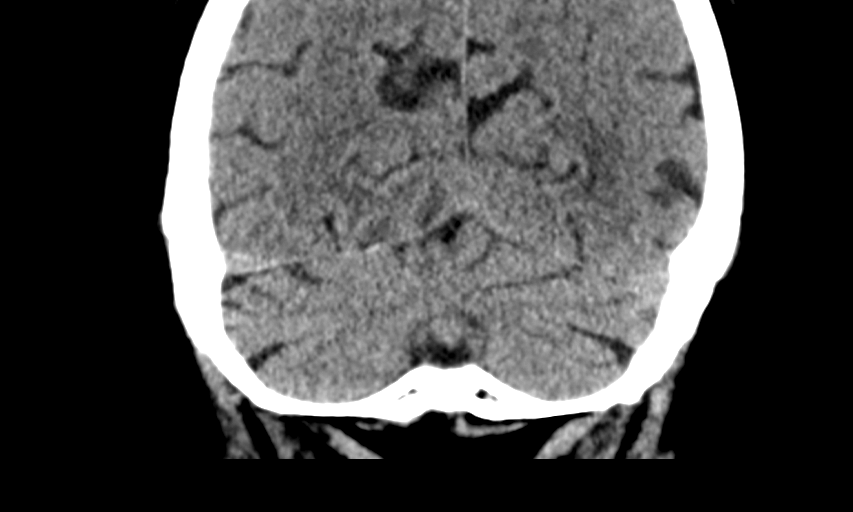

[Series 6: head 3.0 sag st · sagittal · 0.35mm/px · 3 of 67 slices shown]
[im 23/67  brain]
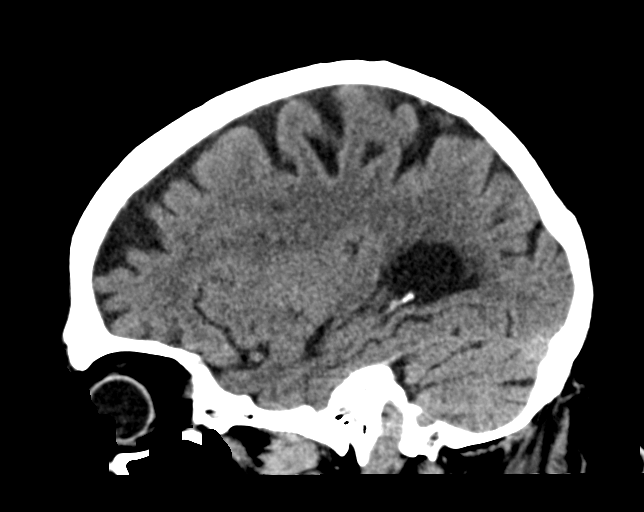
[im 34/67  brain]
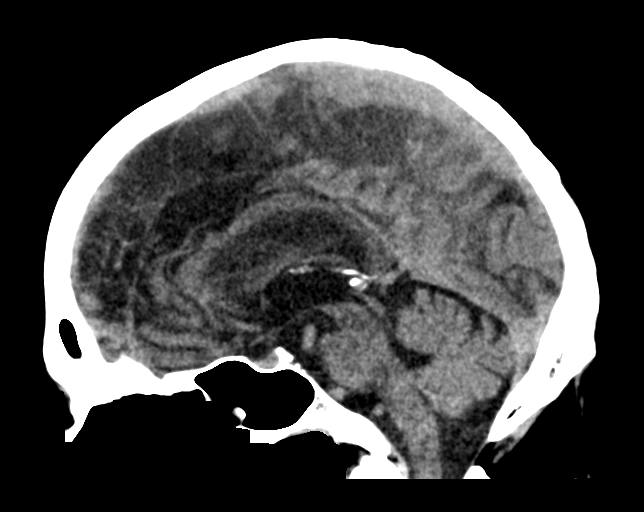
[im 45/67  brain]
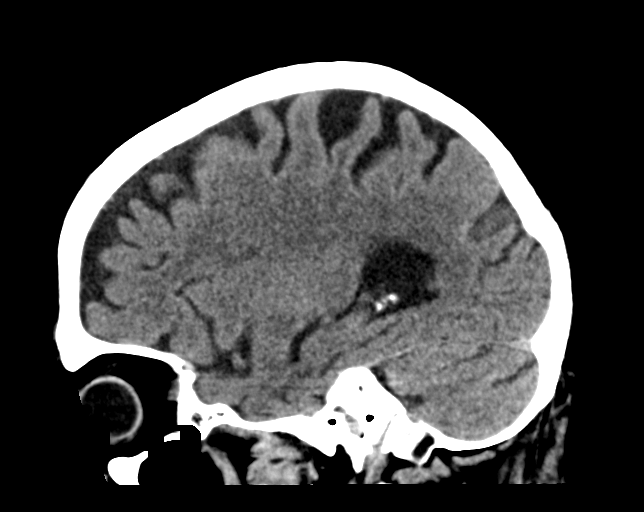

[14 of 47 positions shown; findings below may reference images not displayed]

FINDINGS: Brain: Generalized age-related cerebral atrophy with
mild-to-moderate chronic microvascular ischemic disease. No acute
intracranial hemorrhage. No acute large vessel territory infarct. No
mass lesion, midline shift or mass effect. No hydrocephalus or
extra-axial fluid collection.

Vascular: No hyperdense vessel. Calcified atherosclerosis present at
skull base.

Skull: Scalp soft tissues and calvarium within normal limits.

Sinuses/Orbits: Globes and orbital soft tissues demonstrate no acute
finding. Scattered mucosal thickening noted within the ethmoidal air
cells and maxillary sinuses. Mastoid air cells are clear.

Other: None.

ASPECTS (Alberta Stroke Program Early CT Score)

- Ganglionic level infarction (caudate, lentiform nuclei, internal
capsule, insula, M1-M3 cortex): 7

- Supraganglionic infarction (M4-M6 cortex): 3

Total score (0-10 with 10 being normal): 10
IMPRESSION: 1. No acute intracranial infarct or other abnormality.
2. ASPECTS is 10.
3. Age-related cerebral atrophy with mild-to-moderate chronic
microvascular ischemic disease.

These results were communicated to Dr. DREY at [DATE] pmon
[DATE]by text page via the [employer] messaging system.

## 2020-10-15 IMAGING — CR DG PELVIS 1-2V
1 series · 1 of 1 positions shown · non-contrast
Comparison: None.

CLINICAL DATA: Syncope

EXAM:
PELVIS - 1-2 VIEW

[pelvis ap]
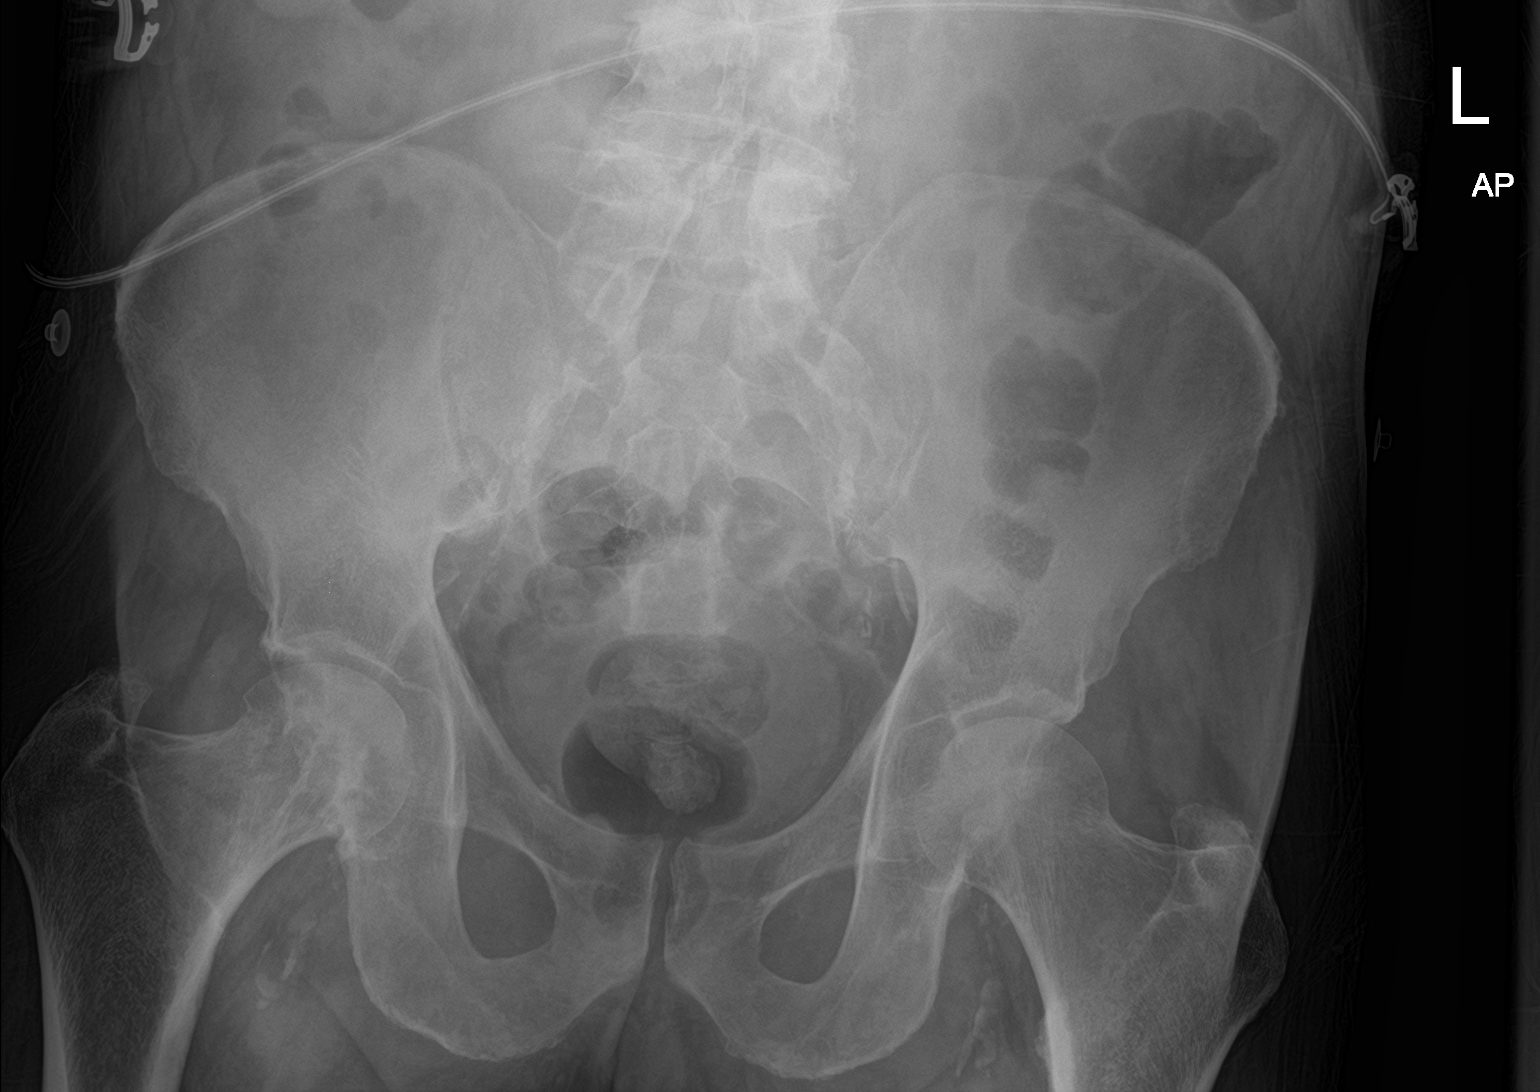

[1 of 1 positions shown; findings below may reference images not displayed]

FINDINGS: Mild symmetric degenerative changes within the hips with joint space
narrowing and spurring. SI joints symmetric and unremarkable. No
acute bony abnormality. Specifically, no fracture, subluxation, or
dislocation. Vascular calcifications.
IMPRESSION: Mild degenerative changes in the hips.  No acute bony abnormality.

## 2020-10-15 IMAGING — CT CT HIP*R* W/O CM
2 of 3 series · 17 of 46 positions shown, 19 images · non-contrast
Comparison: Right hip radiographs [DATE]

CLINICAL DATA: Syncope. Patient found on floor. Evaluate for hip
fracture.

EXAM:
CT OF THE RIGHT HIP WITHOUT CONTRAST
TECHNIQUE: Multidetector CT imaging of the right hip was performed according to
the standard protocol. Multiplanar CT image reconstructions were
also generated.

[Series 5: soft tissue · axial · 0.48mm/px · z∈[+788,+962]mm · 14 of 101 slices shown, 16 images]
[im 7/101  soft-tissue]
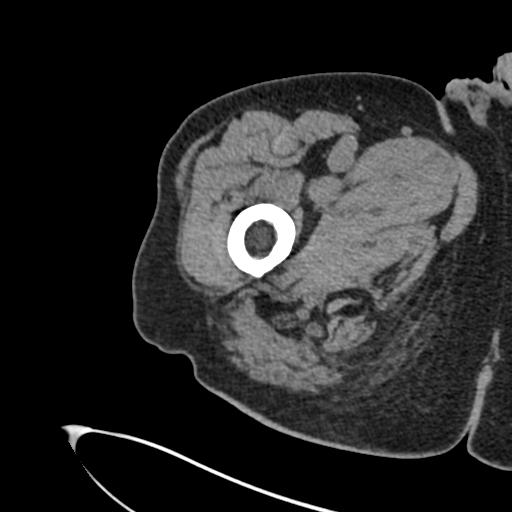
[im 7/101  bone]
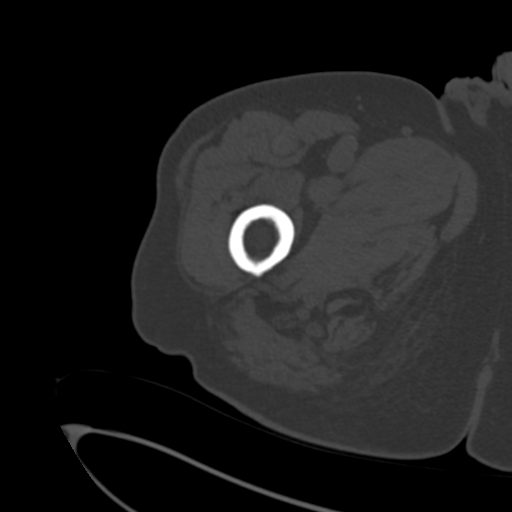
[im 13/101  soft-tissue]
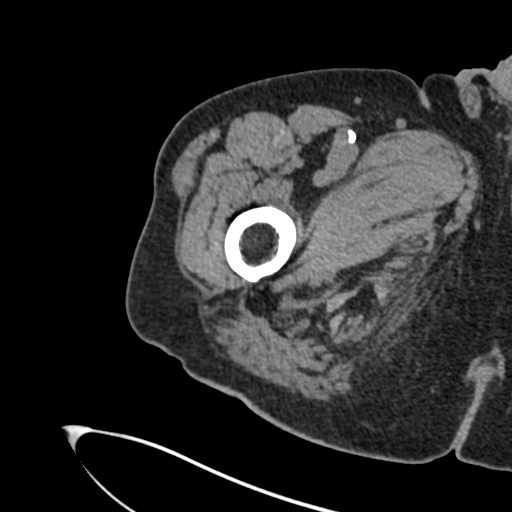
[im 20/101  soft-tissue]
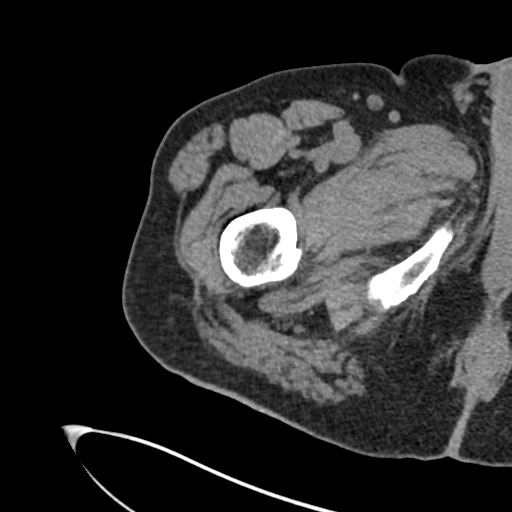
[im 26/101  soft-tissue]
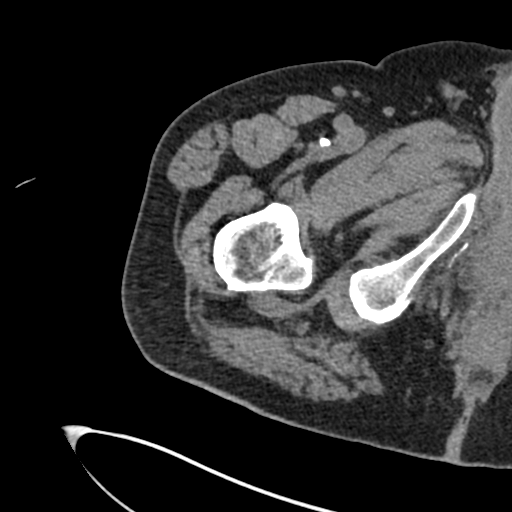
[im 33/101  soft-tissue]
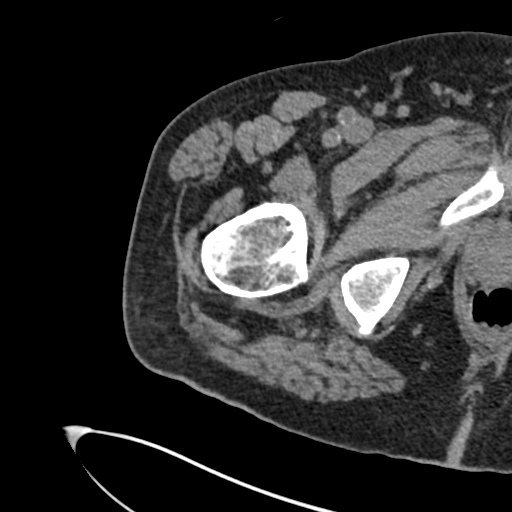
[im 39/101  soft-tissue]
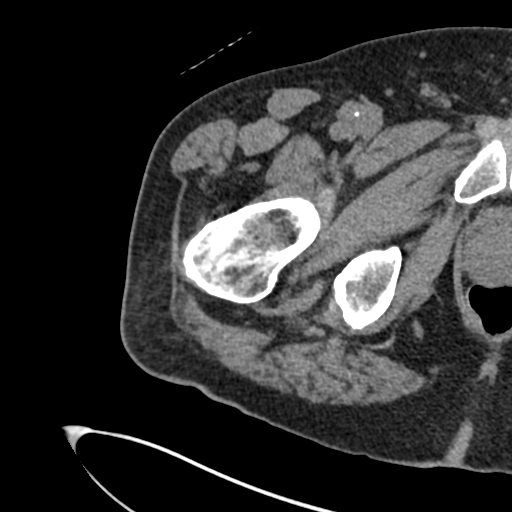
[im 46/101  soft-tissue]
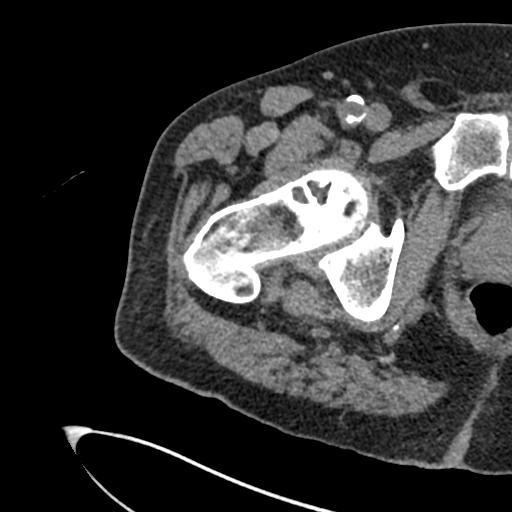
[im 55/101  soft-tissue]
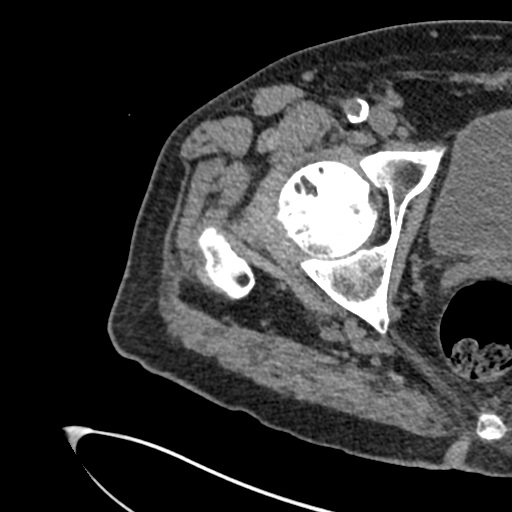
[im 62/101  soft-tissue]
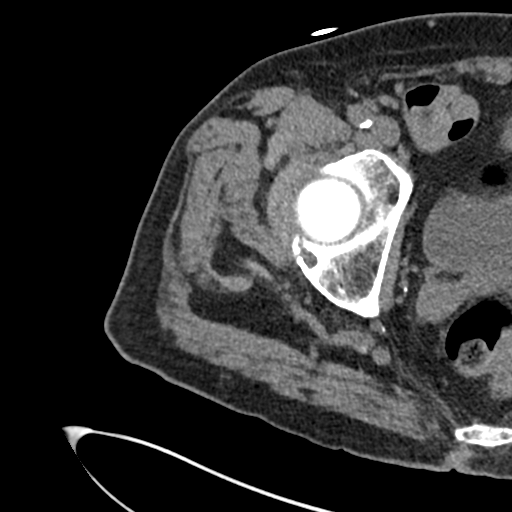
[im 62/101  bone]
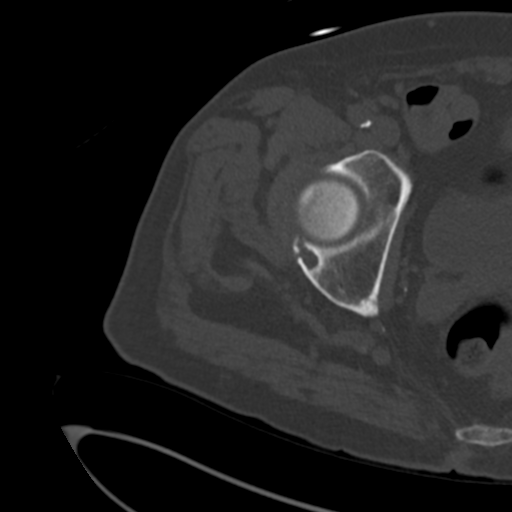
[im 68/101  soft-tissue]
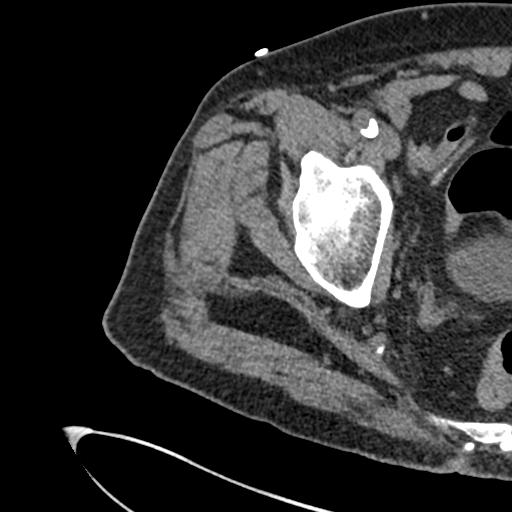
[im 75/101  soft-tissue]
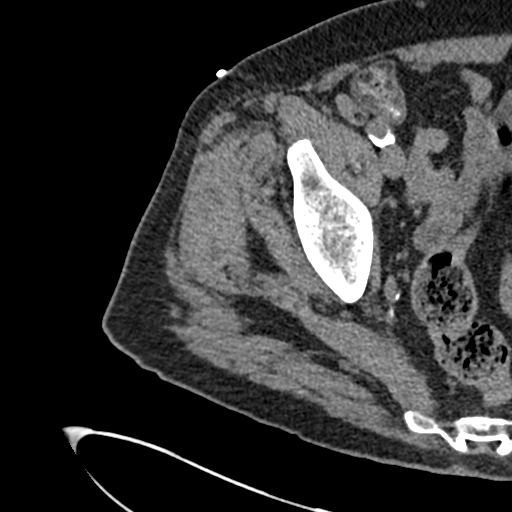
[im 81/101  soft-tissue]
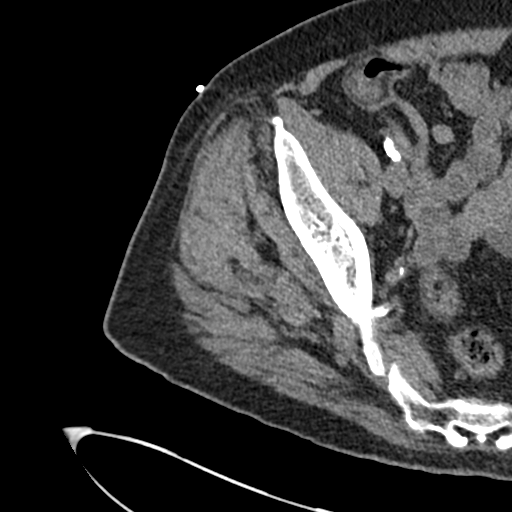
[im 88/101  soft-tissue]
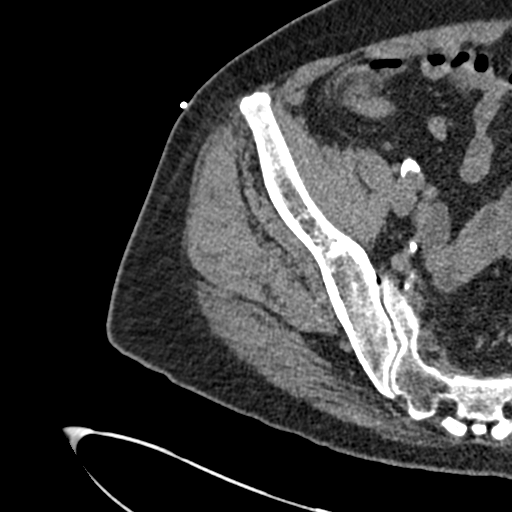
[im 94/101  soft-tissue]
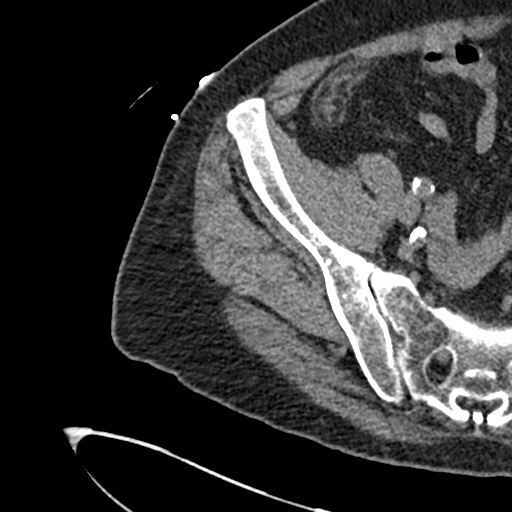

[Series 6: cor soft · coronal · 0.39mm/px · 3 of 132 slices shown]
[im 44/132  soft-tissue]
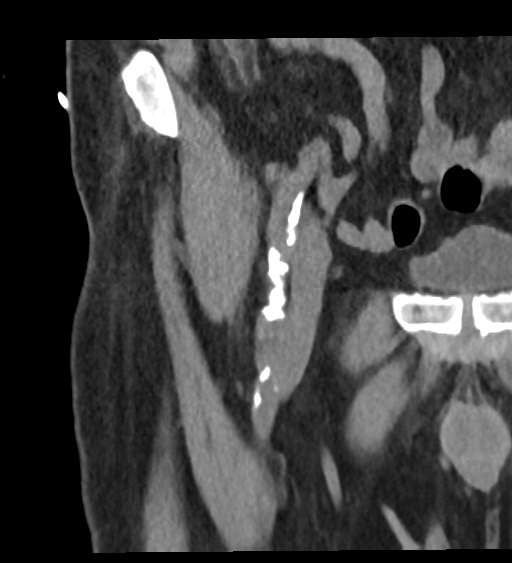
[im 59/132  soft-tissue]
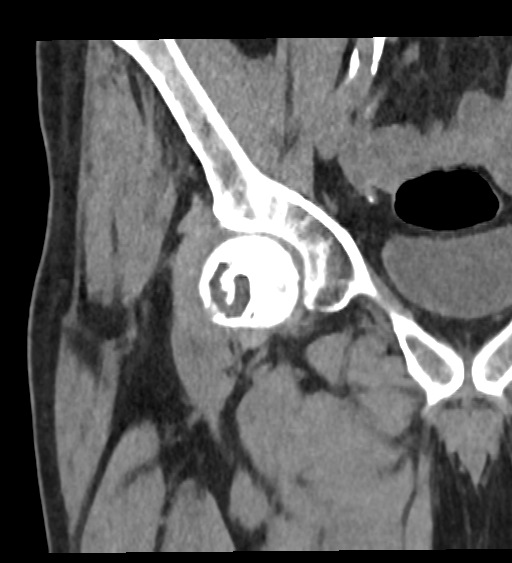
[im 73/132  soft-tissue]
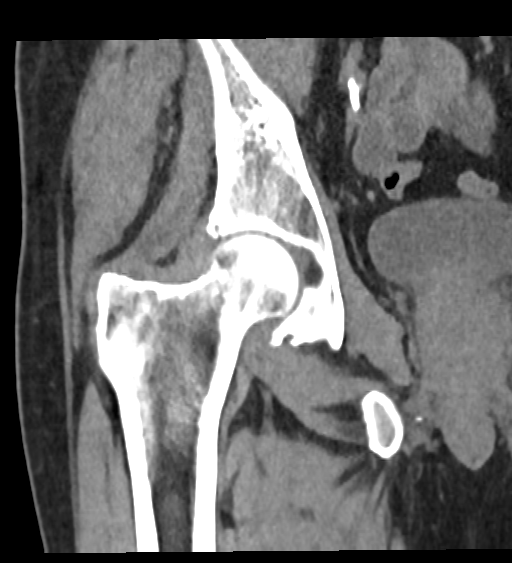

[17 of 46 positions shown; findings below may reference images not displayed]

FINDINGS: Bones/Joint/Cartilage

No evidence of acute fracture or dislocation of the right hip.
Degenerative changes are present in the right hip. Subcortical cyst
in the posterior acetabulum is likely degenerative. Prominent cyst
with surrounding sclerosis demonstrated in the anterior aspect of
the right femoral head may represent a degenerative cyst or a bone
cyst. No expansile change, periosteal reaction, or bone destruction
to suggest an aggressive lesion. No significant joint effusion.

Ligaments

Suboptimally assessed by CT.

Muscles and Tendons

Right hip musculature appears intact. No intramuscular mass,
collection, or hematoma.

Soft tissues

Vascular calcifications in the external iliac and common femoral
arteries. No soft tissue hematoma or collection.
IMPRESSION: 1. No evidence of acute fracture or dislocation of the right hip.
2. Degenerative changes in the right hip.
3. Prominent cyst with surrounding sclerosis in the anterior aspect
of the right femoral head may represent a degenerative cyst or a
bone cyst.

## 2020-10-15 MED ORDER — PANTOPRAZOLE SODIUM 40 MG PO TBEC
40.0000 mg | DELAYED_RELEASE_TABLET | Freq: Two times a day (BID) | ORAL | Status: DC
  Administered 2020-10-16 – 2020-10-22 (×10): 40 mg via ORAL
  Filled 2020-10-15 (×13): qty 1

## 2020-10-15 MED ORDER — LEVETIRACETAM IN NACL 1000 MG/100ML IV SOLN
1000.0000 mg | Freq: Once | INTRAVENOUS | Status: AC
  Administered 2020-10-15: 1000 mg via INTRAVENOUS
  Filled 2020-10-15: qty 100

## 2020-10-15 MED ORDER — LEVETIRACETAM IN NACL 500 MG/100ML IV SOLN: 500.0000 mg | Freq: Two times a day (BID) | INTRAVENOUS | Status: DC

## 2020-10-15 MED ORDER — ENOXAPARIN SODIUM 40 MG/0.4ML ~~LOC~~ SOLN
40.0000 mg | SUBCUTANEOUS | Status: DC
  Administered 2020-10-16 – 2020-10-21 (×5): 40 mg via SUBCUTANEOUS
  Filled 2020-10-15 (×7): qty 0.4

## 2020-10-15 MED ORDER — LORAZEPAM 2 MG/ML IJ SOLN: 1.0000 mg | Freq: Once | INTRAMUSCULAR | Status: AC

## 2020-10-15 MED ORDER — FOLIC ACID 1 MG PO TABS
1.0000 mg | ORAL_TABLET | Freq: Every day | ORAL | Status: DC
  Administered 2020-10-16 – 2020-10-22 (×6): 1 mg via ORAL
  Filled 2020-10-15 (×7): qty 1

## 2020-10-15 MED ORDER — LORAZEPAM 2 MG/ML IJ SOLN
1.0000 mg | INTRAMUSCULAR | Status: DC | PRN
  Administered 2020-10-16: 2 mg via INTRAVENOUS
  Administered 2020-10-16: 1 mg via INTRAVENOUS
  Administered 2020-10-17: 2 mg via INTRAVENOUS
  Filled 2020-10-15 (×4): qty 1

## 2020-10-15 MED ORDER — POTASSIUM CHLORIDE 10 MEQ/100ML IV SOLN
10.0000 meq | INTRAVENOUS | Status: AC
  Administered 2020-10-15 (×3): 10 meq via INTRAVENOUS
  Filled 2020-10-15 (×4): qty 100

## 2020-10-15 MED ORDER — INSULIN ASPART 100 UNIT/ML ~~LOC~~ SOLN
0.0000 [IU] | Freq: Three times a day (TID) | SUBCUTANEOUS | Status: DC
  Administered 2020-10-17 – 2020-10-18 (×2): 1 [IU] via SUBCUTANEOUS
  Administered 2020-10-18 (×2): 2 [IU] via SUBCUTANEOUS
  Administered 2020-10-19: 1 [IU] via SUBCUTANEOUS
  Administered 2020-10-19: 3 [IU] via SUBCUTANEOUS
  Administered 2020-10-19 – 2020-10-20 (×2): 2 [IU] via SUBCUTANEOUS
  Administered 2020-10-20: 1 [IU] via SUBCUTANEOUS
  Administered 2020-10-20: 0 [IU] via SUBCUTANEOUS

## 2020-10-15 MED ORDER — POTASSIUM CHLORIDE 10 MEQ/100ML IV SOLN
10.0000 meq | INTRAVENOUS | Status: DC
  Administered 2020-10-16: 10 meq via INTRAVENOUS

## 2020-10-15 MED ORDER — LORAZEPAM 1 MG PO TABS
1.0000 mg | ORAL_TABLET | ORAL | Status: DC | PRN
  Administered 2020-10-16 (×2): 1 mg via ORAL
  Administered 2020-10-17: 2 mg via ORAL
  Administered 2020-10-17: 4 mg via ORAL
  Administered 2020-10-18: 1 mg via ORAL
  Filled 2020-10-15 (×3): qty 1
  Filled 2020-10-15: qty 4
  Filled 2020-10-15: qty 2

## 2020-10-15 MED ORDER — ADULT MULTIVITAMIN W/MINERALS CH
1.0000 | ORAL_TABLET | Freq: Every day | ORAL | Status: DC
  Administered 2020-10-16 – 2020-10-22 (×6): 1 via ORAL
  Filled 2020-10-15 (×7): qty 1

## 2020-10-15 MED ORDER — ACETAMINOPHEN 325 MG PO TABS
650.0000 mg | ORAL_TABLET | Freq: Four times a day (QID) | ORAL | Status: DC | PRN
  Administered 2020-10-16 – 2020-10-22 (×9): 650 mg via ORAL
  Filled 2020-10-15 (×8): qty 2

## 2020-10-15 MED ORDER — ACETAMINOPHEN 650 MG RE SUPP: 650.0000 mg | Freq: Four times a day (QID) | RECTAL | Status: DC | PRN

## 2020-10-15 MED ORDER — IRBESARTAN 300 MG PO TABS
300.0000 mg | ORAL_TABLET | Freq: Every day | ORAL | Status: DC
  Administered 2020-10-16 – 2020-10-22 (×6): 300 mg via ORAL
  Filled 2020-10-15 (×7): qty 1

## 2020-10-15 MED ORDER — ONDANSETRON HCL 4 MG/2ML IJ SOLN: 4.0000 mg | Freq: Four times a day (QID) | INTRAMUSCULAR | Status: DC | PRN

## 2020-10-15 MED ORDER — LORAZEPAM 2 MG/ML IJ SOLN
INTRAMUSCULAR | Status: AC
  Administered 2020-10-15: 1 mg via INTRAVENOUS
  Filled 2020-10-15: qty 1

## 2020-10-15 MED ORDER — MAGNESIUM SULFATE 2 GM/50ML IV SOLN
2.0000 g | Freq: Once | INTRAVENOUS | Status: AC
  Administered 2020-10-15: 2 g via INTRAVENOUS
  Filled 2020-10-15: qty 50

## 2020-10-15 MED ORDER — THIAMINE HCL 100 MG PO TABS
100.0000 mg | ORAL_TABLET | Freq: Every day | ORAL | Status: DC
  Administered 2020-10-16 – 2020-10-22 (×6): 100 mg via ORAL
  Filled 2020-10-15 (×7): qty 1

## 2020-10-15 MED ORDER — ONDANSETRON HCL 4 MG PO TABS: 4.0000 mg | ORAL_TABLET | Freq: Four times a day (QID) | ORAL | Status: DC | PRN

## 2020-10-15 MED ORDER — LACTATED RINGERS IV SOLN: INTRAVENOUS | Status: AC

## 2020-10-15 MED ORDER — LORAZEPAM 2 MG/ML IJ SOLN
1.0000 mg | Freq: Once | INTRAMUSCULAR | Status: AC
  Administered 2020-10-15: 1 mg via INTRAVENOUS

## 2020-10-15 MED ORDER — POTASSIUM CHLORIDE 20 MEQ PO PACK
40.0000 meq | PACK | Freq: Once | ORAL | Status: AC
  Administered 2020-10-16: 40 meq via ORAL
  Filled 2020-10-15: qty 2

## 2020-10-15 MED ORDER — POLYETHYLENE GLYCOL 3350 17 G PO PACK: 17.0000 g | PACK | Freq: Every day | ORAL | Status: DC | PRN

## 2020-10-15 MED ORDER — CALCIUM GLUCONATE-NACL 2-0.675 GM/100ML-% IV SOLN
2.0000 g | Freq: Once | INTRAVENOUS | Status: AC
  Administered 2020-10-15: 2000 mg via INTRAVENOUS
  Filled 2020-10-15: qty 100

## 2020-10-15 MED ORDER — SODIUM CHLORIDE 0.9% FLUSH
3.0000 mL | Freq: Once | INTRAVENOUS | Status: AC
  Administered 2020-10-15: 3 mL via INTRAVENOUS

## 2020-10-15 MED ORDER — THIAMINE HCL 100 MG/ML IJ SOLN
100.0000 mg | Freq: Every day | INTRAMUSCULAR | Status: DC
  Administered 2020-10-20: 100 mg via INTRAVENOUS
  Filled 2020-10-15 (×2): qty 2

## 2020-10-15 MED ORDER — METOPROLOL SUCCINATE ER 50 MG PO TB24
50.0000 mg | ORAL_TABLET | Freq: Every day | ORAL | Status: DC
  Administered 2020-10-16 – 2020-10-22 (×6): 50 mg via ORAL
  Filled 2020-10-15 (×6): qty 1
  Filled 2020-10-15: qty 2

## 2020-10-15 MED ORDER — ATORVASTATIN CALCIUM 10 MG PO TABS
10.0000 mg | ORAL_TABLET | Freq: Every day | ORAL | Status: DC
  Administered 2020-10-16 – 2020-10-22 (×7): 10 mg via ORAL
  Filled 2020-10-15 (×7): qty 1

## 2020-10-15 NOTE — ED Notes (Signed)
Pt transported to CT ?

## 2020-10-15 NOTE — ED Triage Notes (Signed)
Pt BIB [facility] as a Code Stroke from [employer].  Pt was on the phone with friend when friend noticed confusion and yelling from the pt.  Friend called EMS who upon arrival noted pt was on floor from fall after sliding out of chair.  EMS noted L. Sided Facial Droop, Confusion, and Slurred Speech.  Symptoms appear to have resolved upon arrival.  VSS with EMS: 138/72 BP ST 106 HR 97% RA O2

## 2020-10-15 NOTE — ED Notes (Signed)
Date and time results received: 10/15/20 7:48 PM  (use smartphrase ".now" to insert current time)  Test: potassium  Critical Value: 2.7  Name of Provider Notified: wentz  Orders Received? Or Actions Taken?:

## 2020-10-15 NOTE — H&P (Addendum)
History and Physical    Devin Garza MWN:[id number] DOB: 06/15/1941 DOA: 10/15/2020  PCP: Isaac Bliss, Rayford Halsted, MD  Patient coming from: [facility]   Chief Complaint:  Chief Complaint  Patient presents with  . Code Stroke     HPI:    79 year old male with past history of hypertension, hyperlipidemia, osteoarthritis, upper gastrointestinal bleeding in 2017 secondary to gastric and duodenal AVMs, depression, basal cell carcinoma who presents to [facility] emergency department via EMS after being found down in his home.  Patient is an extremely poor historian due to lethargy and disorientation.  Majority of the history is been obtained from a combination of both patient, emergency department staff and EMS.  Earlier in the day on 10/20, patient was on the phone with a friend when the friend noticed that the patient was acutely confused and suddenly yelled out.  EMS was called when they responded to the patient's living quarters he was found down on the floor with acute confusion and slurring of speech.    Due to concerns of possible left facial droop, patient initially presented to [facility] emergency department as a code stroke.  Patient was immediately evaluated by Dr. Curly Shores with neurology who after evaluated the patient's initial CT imaging of the head did not feel the patient was clinically suffering from an acute stroke.  Upon initial evaluation patient was found to have profound hypomagnesemia as well as concurrent hypokalemia and hypocalcemia.  Upon evaluation in the emergency department patient exhibited a 1 minute episode of tonic-clonic activity with evidence of postictal state after the episode.  1000 mg of Keppra was ordered by the emergency department staff at this point.  Because of this witnessed episode of seizure activity in the emergency department it was suspected that the patient suffered an initial seizure earlier today in his home.  Case was  discussed with neurology who recommended MRI brain, EEG and aggressive repletion of electrolytes.  The hospital group was called to assess the patient for admission the hospital.  Review of Systems:   Review of Systems  Unable to perform ROS: Mental status change    Past Medical History:  Diagnosis Date  . Anemia   . Basal cell adenocarcinoma 09/03/2008   Qualifier: Diagnosis of  By: Lorelei Pont MD, Frederico Hamman    Overview:  Overview:  Qualifier: Diagnosis of  By: Lorelei Pont MD, Frederico Hamman   . Basal cell carcinoma of lip 2007   multiple sites  . Blood transfusion without reported diagnosis   . ED (erectile dysfunction)   . GERD (gastroesophageal reflux disease)   . HLD (hyperlipidemia)   . Hx of squamous cell carcinoma of skin 08/25/2016   L superior pectoral  . Hypertension   . Osteoarthritis   . Peripheral vascular disease ([place])    Occlusion and Stenosis of Carotid Artery  . Tobacco abuse   . Tubular adenoma of colon 2005  . Wears dentures    full upper and lower    Past Surgical History:  Procedure Laterality Date  . ANKLE ARTHROSCOPY W/ OPEN REPAIR Left   . COLONOSCOPY  2006   [place]-Tubular adenoma  . COLONOSCOPY N/A 07/23/2015   Procedure: COLONOSCOPY;  Surgeon: Lucilla Lame, MD;  Location: [place];  Service: Gastroenterology;  Laterality: N/A;  WITH BIOPSIES---- TRANSVERSE COLON POLYP DESCENDING COLON POLYP  X  4  . ESOPHAGOGASTRODUODENOSCOPY N/A 07/23/2015   Procedure: ESOPHAGOGASTRODUODENOSCOPY (EGD);  Surgeon: Lucilla Lame, MD;  Location: [place];  Service: Gastroenterology;  Laterality: N/A;  WITH BIOPSY--- DUODENAL BIOPSY GASTRIC BIOPSY  . ESOPHAGOGASTRODUODENOSCOPY N/A 10/28/2016   Procedure: ESOPHAGOGASTRODUODENOSCOPY (EGD);  Surgeon: Manya Silvas, MD;  Location: [facility] ENDOSCOPY;  Service: Endoscopy;  Laterality: N/A;  . ESOPHAGOGASTRODUODENOSCOPY (EGD) WITH PROPOFOL N/A 09/02/2017   Procedure: ESOPHAGOGASTRODUODENOSCOPY (EGD) WITH PROPOFOL;  Surgeon:  Manya Silvas, MD;  Location: [facility] ENDOSCOPY;  Service: Endoscopy;  Laterality: N/A;  . SKIN CANCER EXCISION     LIP  . TONSILLECTOMY     age 75  . UPPER GI ENDOSCOPY  12/2009   Dr. Ivory Broad gastritisl negative for H. pylori; duodenal biopsies neg for sprue     reports that he quit smoking about 7 months ago. His smoking use included cigarettes. He has a 20.00 pack-year smoking history. He has never used smokeless tobacco. He reports current alcohol use of about 14.0 standard drinks of alcohol per week. He reports that he does not use drugs.  No Known Allergies  Family History  Problem Relation Age of Onset  . Lung cancer Mother 84  . Hearing loss Father   . Throat cancer Father   . Heart attack Father   . CAD Father   . Ovarian cancer Sister      Prior to Admission medications   Medication Sig Start Date End Date Taking? Authorizing Provider  atorvastatin (LIPITOR) 10 MG tablet Take 1 tablet (10 mg total) by mouth daily. 06/25/20  Yes Isaac Bliss, Rayford Halsted, MD  buPROPion (WELLBUTRIN XL) 150 MG 24 hr tablet Take 1 tablet (150 mg total) by mouth daily. 05/27/20  Yes Isaac Bliss, Rayford Halsted, MD  Diclofenac Sodium 1.5 % SOLN Apply to small joints up to 4 times a day as needed Patient taking differently: Apply topically See admin instructions. Apply to small joints up to 4 times a day as needed for pain 07/16/19  Yes Copland, Frederico Hamman, MD  DIOVAN HCT 320-12.5 MG tablet Take 1 tablet by mouth daily. 05/27/20  Yes Isaac Bliss, Rayford Halsted, MD  esomeprazole (NEXIUM 24HR) 20 MG capsule Take 40 mg by mouth 2 (two) times daily before a meal.   Yes [provider]  eszopiclone (LUNESTA) 2 MG TABS tablet Take 1 tablet (2 mg total) by mouth at bedtime as needed for sleep. Take immediately before bedtime 05/27/20  Yes Isaac Bliss, Rayford Halsted, MD  loratadine (CLARITIN) 10 MG tablet Take 10 mg by mouth daily as needed for allergies.   Yes [provider]  metoprolol  succinate (TOPROL-XL) 50 MG 24 hr tablet TAKE 1 TABLET BY MOUTH DAILY. TAKE WITH OR IMMEDIATELY FOLLOWING A MEAL. Patient taking differently: Take 50 mg by mouth daily.  07/21/20  Yes Laurey Morale, MD  Multiple Vitamin (MULTIVITAMIN) tablet Take 1 tablet by mouth daily.   Yes [provider]  Colchicine (MITIGARE) 0.6 MG CAPS Take 1 tablet by mouth 2 (two) times daily as needed. Patient not taking: Reported on 10/15/2020 07/23/19   Owens Loffler, MD    Physical Exam: Vitals:   10/15/20 1942 10/15/20 2015 10/15/20 2030 10/15/20 2215  BP:  130/77 140/87 (!) 141/76  Pulse:  86 85 81  Resp:  (!) 22 (!) 28 20  Temp:      TempSrc:      SpO2:  99% 100% 99%  Weight: 80 kg     Height: 5\' 7"  (1.702 m)       Constitutional: Lethargic but arousable, oriented x1, not in any acute distress. Skin: no rashes, no lesions, good skin  turgor noted. Eyes: Pupils are equally reactive to light.  No evidence of scleral icterus or conjunctival pallor.  ENMT: Dry mucous membranes noted.  Posterior pharynx clear of any exudate or lesions.   Neck: normal, supple, no masses, no thyromegaly.  No evidence of jugular venous distension.   Respiratory: clear to auscultation bilaterally, no wheezing, no crackles. Normal respiratory effort. No accessory muscle use.  Cardiovascular: 3 out of 6 systolic murmur noted.  Regular rate and rhythm, notable edema of the distal right lower extremity, 2+ pedal pulses. No carotid bruits.  Chest:   Nontender without crepitus or deformity.   Back:   Nontender without crepitus or deformity. Abdomen: Abdomen is soft and nontender.  No evidence of intra-abdominal masses.  Positive bowel sounds noted in all quadrants.   Musculoskeletal: Mild right hip tenderness with pain of the right hip with both passive and active range of motion.  Notable edema of the distal right lower extremity noted as well. no contractures. Normal muscle tone.  Neurologic: Patient is following all  commands.  Patient is lethargic but arousable and oriented x1.  Sensation is seemingly grossly intact.  Patient is seemingly moving all 4 extremities spontaneously.   Psychiatric: Unable to fully assess due to significant lethargy and confusion.  Patient currently does not seem to possess insight as to his current situation.  Labs on Admission: I have personally reviewed following labs and imaging studies -   CBC: Recent Labs  Lab 10/15/20 1905 10/15/20 1916 10/15/20 2211  WBC 6.8  --   --   NEUTROABS 4.5  --   --   HGB 12.1* 12.2* 12.2*  HCT 36.1* 36.0* 36.0*  MCV 92.8  --   --   PLT 267  --   --    Basic Metabolic Panel: Recent Labs  Lab 10/15/20 1905 10/15/20 1916 10/15/20 2211  NA 137 137 137  K 2.7* 2.6* 2.7*  CL 99 100  --   CO2 18*  --   --   GLUCOSE 183* 180*  --   BUN 10 9  --   CREATININE 1.11 1.00  --   CALCIUM 6.7*  --   --   MG  --  0.2*  --    GFR: Estimated Creatinine Clearance: 59.8 mL/min (by C-G formula based on SCr of 1 mg/dL). Liver Function Tests: Recent Labs  Lab 10/15/20 1905  AST 48*  ALT 35  ALKPHOS 45  BILITOT 0.8  PROT 6.8  ALBUMIN 3.7   No results for input(s): LIPASE, AMYLASE in the last 168 hours. No results for input(s): AMMONIA in the last 168 hours. Coagulation Profile: Recent Labs  Lab 10/15/20 1905  INR 1.2   Cardiac Enzymes: No results for input(s): CKTOTAL, CKMB, CKMBINDEX, TROPONINI in the last 168 hours. BNP (last 3 results) No results for input(s): PROBNP in the last 8760 hours. HbA1C: No results for input(s): HGBA1C in the last 72 hours. CBG: Recent Labs  Lab 10/15/20 1918  GLUCAP 172*   Lipid Profile: No results for input(s): CHOL, HDL, LDLCALC, TRIG, CHOLHDL, LDLDIRECT in the last 72 hours. Thyroid Function Tests: No results for input(s): TSH, T4TOTAL, FREET4, T3FREE, THYROIDAB in the last 72 hours. Anemia Panel: No results for input(s): VITAMINB12, FOLATE, FERRITIN, TIBC, IRON, RETICCTPCT in the last  72 hours. Urine analysis: No results found for: COLORURINE, APPEARANCEUR, LABSPEC, PHURINE, GLUCOSEU, HGBUR, BILIRUBINUR, KETONESUR, PROTEINUR, UROBILINOGEN, NITRITE, LEUKOCYTESUR  Radiological Exams on Admission - Personally Reviewed: DG Chest 1 View  Result  Date: 10/15/2020 CLINICAL DATA:  Evaluate for pneumonia EXAM: CHEST  1 VIEW COMPARISON:  05/12/2020 FINDINGS: Cardiac shadow is mildly enlarged but stable. Aortic calcifications are seen. Lungs are well aerated bilaterally. No focal infiltrate or sizable effusion is seen. No bony abnormality is noted. IMPRESSION: No active disease. Electronically Signed   By: Inez Catalina M.D.   On: 10/15/2020 22:15   DG Pelvis 1-2 Views  Result Date: 10/15/2020 CLINICAL DATA:  Syncope EXAM: PELVIS - 1-2 VIEW COMPARISON:  None. FINDINGS: Mild symmetric degenerative changes within the hips with joint space narrowing and spurring. SI joints symmetric and unremarkable. No acute bony abnormality. Specifically, no fracture, subluxation, or dislocation. Vascular calcifications. IMPRESSION: Mild degenerative changes in the hips.  No acute bony abnormality. Electronically Signed   By: Rolm Baptise M.D.   On: 10/15/2020 20:46   DG HIP UNILAT WITH PELVIS 2-3 VIEWS RIGHT  Result Date: 10/14/2020 CLINICAL DATA:  Right hip pain x2 weeks. EXAM: DG HIP (WITH OR WITHOUT PELVIS) 2-3V RIGHT COMPARISON:  None. FINDINGS: There is no evidence of hip fracture or dislocation. Moderate severity degenerative changes seen involving the bilateral hips. This is seen in the form of joint space narrowing and acetabular sclerosis. Chronic curvilinear sclerosis and subcortical cysts are seen involving the right humeral head. Marked severity vascular calcification is noted. IMPRESSION: Moderate severity degenerative changes involving the bilateral hips. Electronically Signed   By: Virgina Norfolk M.D.   On: 10/14/2020 23:58   CT HEAD CODE STROKE WO CONTRAST  Result Date:  10/15/2020 CLINICAL DATA:  Code stroke. Initial evaluation for acute left-sided weakness. EXAM: CT HEAD WITHOUT CONTRAST TECHNIQUE: Contiguous axial images were obtained from the base of the skull through the vertex without intravenous contrast. COMPARISON:  None available. FINDINGS: Brain: Generalized age-related cerebral atrophy with mild-to-moderate chronic microvascular ischemic disease. No acute intracranial hemorrhage. No acute large vessel territory infarct. No mass lesion, midline shift or mass effect. No hydrocephalus or extra-axial fluid collection. Vascular: No hyperdense vessel. Calcified atherosclerosis present at skull base. Skull: Scalp soft tissues and calvarium within normal limits. Sinuses/Orbits: Globes and orbital soft tissues demonstrate no acute finding. Scattered mucosal thickening noted within the ethmoidal air cells and maxillary sinuses. Mastoid air cells are clear. Other: None. ASPECTS [place] Stroke Program Early CT Score) - Ganglionic level infarction (caudate, lentiform nuclei, internal capsule, insula, M1-M3 cortex): 7 - Supraganglionic infarction (M4-M6 cortex): 3 Total score (0-10 with 10 being normal): 10 IMPRESSION: 1. No acute intracranial infarct or other abnormality. 2. ASPECTS is 10. 3. Age-related cerebral atrophy with mild-to-moderate chronic microvascular ischemic disease. These results were communicated to Dr. Curly Shores at 7:28 pmon 10/20/2021by text page via the [facility] messaging system. Electronically Signed   By: Jeannine Boga M.D.   On: 10/15/2020 19:30    EKG: Personally reviewed.  Rhythm is sinus tachycardia with heart rate of 102 bpm.  PVC noted.  No dynamic ST segment changes appreciated.  Assessment/Plan Principal Problem:   Seizure [facility])   Patient initially presented to the emergency department after being found down in his place of residence.  Because of patient's fall was initially not clear but with the 1 minute episode of tonic-clonic  seizure-like activity with notable postictal state witnessed in the emergency department it is likely that the initial episode that the patient experienced at home was also a seizure.  Etiology of seizures are possibly related to hypovolemia or severe electrolyte disturbance.  Per chart review patient has a history of alcohol abuse and is  still actively drinking at least 2 glasses of wine daily according to outpatient notes.  If patient is drinking more heavily an element of alcohol intoxication or withdrawal may be contributing to the seizure activity.  Additionally evaluating patient for any evidence of underlying infection with chest x-ray and urinalysis especially considering patient's reports of urinary retention in the emergency department.  Obtaining EEG and MR imaging of the brain per neurology recommendations  Emergency department divider has ordered 1000 mg of IV Keppra at this time.  Will place patient on 500 mg IV every 12 starting tomorrow morning unless neurology recommends otherwise.  Seizure precautions  Monitoring patient on telemetry  As needed Ativan for repeat seizure activity  Active Problems: History of alcohol abuse   Patient has endorsed drinking at least 2 to 3 glasses of wine daily to both his primary care provider yesterday as well as to me during his stay in the emergency department  Considering the patient's multiple seizures today, alcohol may be related, either be intoxication or withdrawal  We will place patient on CIWA protocol with as needed benzodiazepines for withdrawal symptoms  Placing patient on thiamine and folate supplementation  Acute urinary retention   Patient reporting lower abdominal discomfort with inability to urinate in the emergency room  Ordering straight cath  Ordering urinalysis and urine culture  Considering vague presentation with malaise fatigue and seizure we will additionally obtain CT imaging of the abdomen and pelvis  to evaluate for any evidence of insidious malignancy  No history of BPH although considering patient's age this could very well be the cause.    Essential hypertension   Continue home regimen of antihypertensives with the exception of hydrochlorothiazide as blood pressure tolerates    Prediabetes   Known history of prediabetes  Patient is hyperglycemic upon arrival  Ordering hemoglobin A1c  Accu-Cheks before every meal and nightly for now    Hypokalemia   Patient exhibiting multiple electrolyte abnormalities including substantial hyperkalemia and severe hypomagnesia  Initially replacing magnesium with 4 g of intravenous magnesium sulfate  Then will proceed to provide patient with intravenous potassium chloride  Monitoring potassium and magnesium with serial chemistries    Hypomagnesemia   Please see assessment and plan above.  Hypocalcemia   Replacing with intravenous calcium gluconate  Obtain repeat calcium level in the morning along with ionized calcium level    Right hip pain   Patient complaining of a 1 week history of right lower extremity swelling as well as right hip pain for which the patient saw his primary care provider yesterday.   Hip x-ray performed here in the emergency department reveals no evidence of fracture  On examination, patient has concurrent right lower extremity edema which the patient states is new.  Patient additionally has right hip pain that is reproducible with isolation of the right hip joint.  Ordering CT imaging of the right hip to ensure that there is no evidence of fracture that was missed on x-ray.  Additionally ordering the right lower extremity ultrasound to rule out DVT    GERD without esophagitis   Longstanding history of GERD with known history of gastric and duodenal AVMs  Transitioning patient from home regimen of Nexium to Protonix 40 mg twice daily    Mixed hyperlipidemia  Continue home regimen of statin  therapy    Code Status:  Full code Family Communication: Spoke to Ihor Meinzer via phone conversation [phone] who has POA, updated on plan of care.   Status is:  Observation  The patient remains OBS appropriate and will d/c before 2 midnights.  Dispo: The patient is from: ALF              Anticipated d/c is to: ALF              Anticipated d/c date is: 2 days              Patient currently is not medically stable to d/c.        Vernelle Emerald MD [facility] Pager [phone]  If 7PM-7AM, please contact night-coverage [email or web address] Use universal [place] password for that web site. If you do not have the password, please call the hospital operator.  10/15/2020, 10:54 PM

## 2020-10-15 NOTE — ED Provider Notes (Signed)
[facility] DEPARTMENT Provider Note   CSN: [id number] Arrival date & time: 10/15/20  1902 Patient seen by me at 7:32 PM. An emergency department physician performed an initial assessment on this suspected stroke patient at 67.  History Chief Complaint  Patient presents with  . Code Stroke    Devin Garza is a 79 y.o. male.  HPI Patient was by EMS after call for altered mental status.  They found him on the floor.  At that point he was confused, had slurred speech and was suspected to have had syncope.  They also felt that he had a left facial droop so he was made a code stroke.  The patient reportedly fell out of a chair while he was talking to his friend on the phone, who heard the commotion and subsequently called EMS.  Patient injured his left elbow, and right knee in the fall.  He reports ongoing pain in right hip, for undetermined period of time during which he is unable to move the right hip by his report.  He lives alone.  He denies other associated problems.    Past Medical History:  Diagnosis Date  . Anemia   . Basal cell adenocarcinoma 09/03/2008   Qualifier: Diagnosis of  By: Lorelei Pont MD, Frederico Hamman    Overview:  Overview:  Qualifier: Diagnosis of  By: Lorelei Pont MD, Frederico Hamman   . Basal cell carcinoma of lip 2007   multiple sites  . Blood transfusion without reported diagnosis   . ED (erectile dysfunction)   . GERD (gastroesophageal reflux disease)   . HLD (hyperlipidemia)   . Hx of squamous cell carcinoma of skin 08/25/2016   L superior pectoral  . Hypertension   . Osteoarthritis   . Peripheral vascular disease ([place])    Occlusion and Stenosis of Carotid Artery  . Tobacco abuse   . Tubular adenoma of colon 2005  . Wears dentures    full upper and lower    Patient Active Problem List   Diagnosis Date Noted  . Gout 03/21/2017  . History of upper gastrointestinal bleeding 11/10/2016  . Angiodysplasia of stomach and duodenum without  hemorrhage   . Benign neoplasm of transverse colon   . Benign neoplasm of descending colon   . Esophageal reflux 05/28/2015  . Tubular adenoma of colon 05/28/2015  . Prediabetes 04/21/2011  . Alcohol abuse 01/21/2011  . Anemia, unspecified 11/12/2009  . Basal cell carcinoma of skin 09/03/2008  . Carotid artery stenosis 08/28/2008  . Insomnia 08/28/2008  . Hyperlipidemia 08/27/2008  . Essential hypertension 08/27/2008    Past Surgical History:  Procedure Laterality Date  . ANKLE ARTHROSCOPY W/ OPEN REPAIR Left   . COLONOSCOPY  2006   [place]-Tubular adenoma  . COLONOSCOPY N/A 07/23/2015   Procedure: COLONOSCOPY;  Surgeon: Lucilla Lame, MD;  Location: [facility];  Service: Gastroenterology;  Laterality: N/A;  WITH BIOPSIES---- TRANSVERSE COLON POLYP DESCENDING COLON POLYP  X  4  . ESOPHAGOGASTRODUODENOSCOPY N/A 07/23/2015   Procedure: ESOPHAGOGASTRODUODENOSCOPY (EGD);  Surgeon: Lucilla Lame, MD;  Location: [facility];  Service: Gastroenterology;  Laterality: N/A;  WITH BIOPSY--- DUODENAL BIOPSY GASTRIC BIOPSY  . ESOPHAGOGASTRODUODENOSCOPY N/A 10/28/2016   Procedure: ESOPHAGOGASTRODUODENOSCOPY (EGD);  Surgeon: Manya Silvas, MD;  Location: [facility] ENDOSCOPY;  Service: Endoscopy;  Laterality: N/A;  . ESOPHAGOGASTRODUODENOSCOPY (EGD) WITH PROPOFOL N/A 09/02/2017   Procedure: ESOPHAGOGASTRODUODENOSCOPY (EGD) WITH PROPOFOL;  Surgeon: Manya Silvas, MD;  Location: [facility] ENDOSCOPY;  Service: Endoscopy;  Laterality: N/A;  . SKIN CANCER  EXCISION     LIP  . TONSILLECTOMY     age 52  . UPPER GI ENDOSCOPY  12/2009   Dr. Ivory Broad gastritisl negative for H. pylori; duodenal biopsies neg for sprue       Family History  Problem Relation Age of Onset  . Lung cancer Mother 51  . Hearing loss Father   . Throat cancer Father   . Heart attack Father   . CAD Father   . Ovarian cancer Sister     Social History   Tobacco Use  . Smoking status: Former Smoker    Packs/day:  0.50    Years: 40.00    Pack years: 20.00    Types: Cigarettes    Quit date: 03/10/2020    Years since quitting: 0.6  . Smokeless tobacco: Never Used  . Tobacco comment: cigars occassionally  Vaping Use  . Vaping Use: Never used  Substance Use Topics  . Alcohol use: Yes    Alcohol/week: 14.0 standard drinks    Types: 14 Glasses of wine per week    Comment: 2 glasses of wine daily  . Drug use: No    Home Medications Prior to Admission medications   Medication Sig Start Date End Date Taking? Authorizing Provider  atorvastatin (LIPITOR) 10 MG tablet Take 1 tablet (10 mg total) by mouth daily. 06/25/20  Yes Isaac Bliss, Rayford Halsted, MD  Diclofenac Sodium 1.5 % SOLN Apply to small joints up to 4 times a day as needed 07/16/19  Yes Copland, Frederico Hamman, MD  eszopiclone (LUNESTA) 2 MG TABS tablet Take 1 tablet (2 mg total) by mouth at bedtime as needed for sleep. Take immediately before bedtime 05/27/20  Yes Isaac Bliss, Rayford Halsted, MD  Multiple Vitamin (MULTIVITAMIN) tablet Take 1 tablet by mouth daily.   Yes [provider]  buPROPion (WELLBUTRIN XL) 150 MG 24 hr tablet Take 1 tablet (150 mg total) by mouth daily. 05/27/20   Isaac Bliss, Rayford Halsted, MD  Colchicine (MITIGARE) 0.6 MG CAPS Take 1 tablet by mouth 2 (two) times daily as needed. 07/23/19   Copland, Frederico Hamman, MD  DIOVAN HCT 320-12.5 MG tablet Take 1 tablet by mouth daily. 05/27/20   Isaac Bliss, Rayford Halsted, MD  esomeprazole (NEXIUM) 20 MG capsule Take 40 mg by mouth 2 (two) times daily before a meal.    [provider]  loratadine (CLARITIN) 10 MG tablet Take 10 mg by mouth daily as needed for allergies.    [provider]  metoprolol succinate (TOPROL-XL) 50 MG 24 hr tablet TAKE 1 TABLET BY MOUTH DAILY. TAKE WITH OR IMMEDIATELY FOLLOWING A MEAL. Patient taking differently: Take 50 mg by mouth daily.  07/21/20   Laurey Morale, MD    Allergies    Patient has no known allergies.  Review of Systems    Review of Systems  All other systems reviewed and are negative.   Physical Exam Updated Vital Signs BP 140/87   Pulse 85   Temp 97.9 F (36.6 C) (Oral)   Resp (!) 28   Ht 5\' 7"  (1.702 m)   Wt 80 kg   SpO2 100%   BMI 27.62 kg/m   Physical Exam Vitals and nursing note reviewed.  Constitutional:      General: He is not in acute distress.    Appearance: He is well-developed. He is obese. He is not ill-appearing, toxic-appearing or diaphoretic.  HENT:     Head: Normocephalic and atraumatic.     Right Ear:  External ear normal.     Left Ear: External ear normal.  Eyes:     Conjunctiva/sclera: Conjunctivae normal.     Pupils: Pupils are equal, round, and reactive to light.  Neck:     Trachea: Phonation normal.  Cardiovascular:     Rate and Rhythm: Normal rate and regular rhythm.     Heart sounds: Normal heart sounds.  Pulmonary:     Effort: Pulmonary effort is normal.     Breath sounds: Normal breath sounds.  Abdominal:     Palpations: Abdomen is soft.     Tenderness: There is no abdominal tenderness.  Musculoskeletal:        General: Normal range of motion.     Cervical back: Normal range of motion and neck supple.     Comments: Bilateral lower leg edema, right greater than left.  The legs are nontender to palpation.  Patient resists active flexion of the right hip but I am able to mobilize the right hip, passively without pain.  There is no deformity of the hips.  Normal range of motion arms.  He has a abrasion on the left elbow and a abrasion on the right pretibial area, proximally.  Neurovascular intact distally in the hands and feet.  Skin:    General: Skin is warm and dry.  Neurological:     Mental Status: He is alert and oriented to person, place, and time.     Cranial Nerves: No cranial nerve deficit.     Sensory: No sensory deficit.     Motor: No abnormal muscle tone.     Coordination: Coordination normal.     Comments: No dysarthria or aphasia.  No nystagmus.   No facial droop.  Normal strength arms and legs bilaterally.  Psychiatric:        Mood and Affect: Mood normal.        Behavior: Behavior normal.        Thought Content: Thought content normal.        Judgment: Judgment normal.     ED Results / Procedures / Treatments   Labs (all labs ordered are listed, but only abnormal results are displayed) Labs Reviewed  CBC - Abnormal; Notable for the following components:      Result Value   RBC 3.89 (*)    Hemoglobin 12.1 (*)    HCT 36.1 (*)    All other components within normal limits  COMPREHENSIVE METABOLIC PANEL - Abnormal; Notable for the following components:   Potassium 2.7 (*)    CO2 18 (*)    Glucose, Bld 183 (*)    Calcium 6.7 (*)    AST 48 (*)    Anion gap 20 (*)    All other components within normal limits  MAGNESIUM - Abnormal; Notable for the following components:   Magnesium 0.2 (*)    All other components within normal limits  I-STAT CHEM 8, ED - Abnormal; Notable for the following components:   Potassium 2.6 (*)    Glucose, Bld 180 (*)    Calcium, Ion 0.71 (*)    TCO2 18 (*)    Hemoglobin 12.2 (*)    HCT 36.0 (*)    All other components within normal limits  CBG MONITORING, ED - Abnormal; Notable for the following components:   Glucose-Capillary 172 (*)    All other components within normal limits  RESPIRATORY PANEL BY RT PCR (FLU A&B, COVID)  PROTIME-INR  APTT  DIFFERENTIAL    EKG EKG Interpretation  Date/Time:  Wednesday October 15 2020 19:25:26 EDT Ventricular Rate:  102 PR Interval:    QRS Duration: 99 QT Interval:  372 QTC Calculation: 485 R Axis:   20 Text Interpretation: Sinus tachycardia Multiple premature complexes, vent & supraven Nonspecific repol abnormality, diffuse leads since last tracing no significant change Confirmed by Daleen Bo [phone]) on 10/15/2020 8:39:10 PM   Radiology DG Pelvis 1-2 Views  Result Date: 10/15/2020 CLINICAL DATA:  Syncope EXAM: PELVIS - 1-2 VIEW  COMPARISON:  None. FINDINGS: Mild symmetric degenerative changes within the hips with joint space narrowing and spurring. SI joints symmetric and unremarkable. No acute bony abnormality. Specifically, no fracture, subluxation, or dislocation. Vascular calcifications. IMPRESSION: Mild degenerative changes in the hips.  No acute bony abnormality. Electronically Signed   By: Rolm Baptise M.D.   On: 10/15/2020 20:46   DG HIP UNILAT WITH PELVIS 2-3 VIEWS RIGHT  Result Date: 10/14/2020 CLINICAL DATA:  Right hip pain x2 weeks. EXAM: DG HIP (WITH OR WITHOUT PELVIS) 2-3V RIGHT COMPARISON:  None. FINDINGS: There is no evidence of hip fracture or dislocation. Moderate severity degenerative changes seen involving the bilateral hips. This is seen in the form of joint space narrowing and acetabular sclerosis. Chronic curvilinear sclerosis and subcortical cysts are seen involving the right humeral head. Marked severity vascular calcification is noted. IMPRESSION: Moderate severity degenerative changes involving the bilateral hips. Electronically Signed   By: Virgina Norfolk M.D.   On: 10/14/2020 23:58   CT HEAD CODE STROKE WO CONTRAST  Result Date: 10/15/2020 CLINICAL DATA:  Code stroke. Initial evaluation for acute left-sided weakness. EXAM: CT HEAD WITHOUT CONTRAST TECHNIQUE: Contiguous axial images were obtained from the base of the skull through the vertex without intravenous contrast. COMPARISON:  None available. FINDINGS: Brain: Generalized age-related cerebral atrophy with mild-to-moderate chronic microvascular ischemic disease. No acute intracranial hemorrhage. No acute large vessel territory infarct. No mass lesion, midline shift or mass effect. No hydrocephalus or extra-axial fluid collection. Vascular: No hyperdense vessel. Calcified atherosclerosis present at skull base. Skull: Scalp soft tissues and calvarium within normal limits. Sinuses/Orbits: Globes and orbital soft tissues demonstrate no acute  finding. Scattered mucosal thickening noted within the ethmoidal air cells and maxillary sinuses. Mastoid air cells are clear. Other: None. ASPECTS [facility] Stroke Program Early CT Score) - Ganglionic level infarction (caudate, lentiform nuclei, internal capsule, insula, M1-M3 cortex): 7 - Supraganglionic infarction (M4-M6 cortex): 3 Total score (0-10 with 10 being normal): 10 IMPRESSION: 1. No acute intracranial infarct or other abnormality. 2. ASPECTS is 10. 3. Age-related cerebral atrophy with mild-to-moderate chronic microvascular ischemic disease. These results were communicated to Dr. Curly Shores at 7:28 pmon 10/20/2021by text page via the [facility] messaging system. Electronically Signed   By: Jeannine Boga M.D.   On: 10/15/2020 19:30    Procedures Procedures (including critical care time)  Medications Ordered in ED Medications  potassium chloride 10 mEq in 100 mL IVPB (10 mEq Intravenous New Bag/Given 10/15/20 2024)  magnesium sulfate IVPB 2 g 50 mL (2 g Intravenous New Bag/Given 10/15/20 2104)  sodium chloride flush (NS) 0.9 % injection 3 mL (3 mLs Intravenous Given 10/15/20 2023)    ED Course  I have reviewed the triage vital signs and the nursing notes.  Pertinent labs & imaging results that were available during my care of the patient were reviewed by me and considered in my medical decision making (see chart for details).  Clinical Course as of Oct 16 2119  Wed Oct 15, 2020  1925 Case  discussed with Dr. Curly Shores, neuro hospitalist, who has seen the patient for evaluation of code stroke.  She states that no intervention is necessary at this time.  I will assess the patient as well.   [EW]  2109 Abnormal, low  Magnesium(!!) [EW]  2109 Normal except potassium low, CO2 low, glucose high, calcium low, AST high  Comprehensive metabolic panel(!!) [EW]  2585 Normal except hemoglobin low  CBC(!) [EW]  2110 Normal except potassium low, ionized calcium low, glucose high, hemoglobin low   I-stat chem 8, ED(!!) [EW]  2110 Degenerative changes hips, right greater than left.  No fracture.  Interpreted by me  DG Pelvis 1-2 Views [EW]  2110 Per radiologist no acute abnormality.  CT HEAD CODE STROKE WO CONTRAST [EW]    Clinical Course User Index [EW] Daleen Bo, MD   MDM Rules/Calculators/A&P                           Patient Vitals for the past 24 hrs:  BP Temp Temp src Pulse Resp SpO2 Height Weight  10/15/20 2030 140/87 -- -- 85 (!) 28 100 % -- --  10/15/20 2015 130/77 -- -- 86 (!) 22 99 % -- --  10/15/20 1942 -- -- -- -- -- -- 5\' 7"  (1.702 m) 80 kg  10/15/20 1941 119/74 97.9 F (36.6 C) Oral (!) 105 20 97 % -- --    9:11 PM Reevaluation with update and discussion. After initial assessment and treatment, an updated evaluation reveals he continues to be alert with a nonfocal neurologic exam.  There is no dysarthria or aphasia.  Findings discussed with the patient.  No family members are here currently.Daleen Bo   Medical Decision Making:  This patient is presenting for evaluation of syncope, with fall and injuries, which does require a range of treatment options, and is a complaint that involves a high risk of morbidity and mortality. The differential diagnoses include CVA, acute illness, metabolic disorders, cardiac arrhythmia. I decided to review old records, and in summary elderly male at home, talking on phone when he had a sudden outburst vocally, EMS was summoned and they found him on the floor.  Patient was confused and had a possible left facial droop so he presents as a code stroke, and was immediately evaluated by neurology..  I did not obtain additional historical information from anyone.  Clinical Laboratory Tests Ordered, included CBC, Metabolic panel and Magnesium, Covid. Review indicates hypomagnesemia, hypokalemia, low calcium. Radiologic Tests Ordered, included CT head, pelvic x-ray.  I independently Visualized: Radiographic images, which show  no stroke, on initial CT of the head.  Pelvis x-ray with degenerative changes but no fracture or dislocation  Cardiac Monitor Tracing which shows normal sinus rhythm    Critical Interventions-clinical evaluation, laboratory testing, radiographic imaging, treatment with magnesium and potassium, observation reassessment  After These Interventions, the Patient was reevaluated and was found stable without findings for acute CVA.  Significant metabolic disorder, possibly leading to cardiac arrhythmia leading to syncope.  Patient requires hospitalization for cardiac monitoring, and repletion of electrolyte deficits.  Doubt severe illness, acute CNS disorder or impending vascular collapse.  Patient saw his primary care doctor yesterday, for evaluation of right hip pain.  That x-ray showed degenerative changes.  Repeat x-ray today because of the fall, no fracture.  CRITICAL CARE-yes Performed by: Daleen Bo  Nursing Notes Reviewed/ Care Coordinated Applicable Imaging Reviewed Interpretation of Laboratory Data incorporated into ED treatment  9:20 PM-Consult complete with hospitalist. Patient case explained and discussed.  He agrees to admit patient for further evaluation and treatment. Call ended at 9:50 PM     Final Clinical Impression(s) / ED Diagnoses Final diagnoses:  Syncope, unspecified syncope type  Hypomagnesemia  Hypokalemia    Rx / DC Orders ED Discharge Orders    None       Daleen Bo, MD 10/15/20 2156

## 2020-10-15 NOTE — ED Notes (Signed)
Pt transported to XRAY °

## 2020-10-16 ENCOUNTER — Observation Stay (HOSPITAL_COMMUNITY): Payer: Medicare Other

## 2020-10-16 ENCOUNTER — Observation Stay (HOSPITAL_BASED_OUTPATIENT_CLINIC_OR_DEPARTMENT_OTHER): Payer: Medicare Other

## 2020-10-16 DIAGNOSIS — I700 Atherosclerosis of aorta: Secondary | ICD-10-CM | POA: Diagnosis not present

## 2020-10-16 DIAGNOSIS — J069 Acute upper respiratory infection, unspecified: Secondary | ICD-10-CM | POA: Diagnosis not present

## 2020-10-16 DIAGNOSIS — W19XXXA Unspecified fall, initial encounter: Secondary | ICD-10-CM | POA: Diagnosis present

## 2020-10-16 DIAGNOSIS — I69828 Other speech and language deficits following other cerebrovascular disease: Secondary | ICD-10-CM | POA: Diagnosis not present

## 2020-10-16 DIAGNOSIS — Z85038 Personal history of other malignant neoplasm of large intestine: Secondary | ICD-10-CM | POA: Diagnosis not present

## 2020-10-16 DIAGNOSIS — E782 Mixed hyperlipidemia: Secondary | ICD-10-CM | POA: Diagnosis not present

## 2020-10-16 DIAGNOSIS — R739 Hyperglycemia, unspecified: Secondary | ICD-10-CM | POA: Diagnosis present

## 2020-10-16 DIAGNOSIS — M7989 Other specified soft tissue disorders: Secondary | ICD-10-CM | POA: Diagnosis not present

## 2020-10-16 DIAGNOSIS — L8990 Pressure ulcer of unspecified site, unspecified stage: Secondary | ICD-10-CM | POA: Insufficient documentation

## 2020-10-16 DIAGNOSIS — Z85828 Personal history of other malignant neoplasm of skin: Secondary | ICD-10-CM | POA: Diagnosis not present

## 2020-10-16 DIAGNOSIS — I712 Thoracic aortic aneurysm, without rupture: Secondary | ICD-10-CM | POA: Diagnosis not present

## 2020-10-16 DIAGNOSIS — I10 Essential (primary) hypertension: Secondary | ICD-10-CM | POA: Diagnosis not present

## 2020-10-16 DIAGNOSIS — M6281 Muscle weakness (generalized): Secondary | ICD-10-CM | POA: Diagnosis not present

## 2020-10-16 DIAGNOSIS — R4182 Altered mental status, unspecified: Secondary | ICD-10-CM | POA: Diagnosis not present

## 2020-10-16 DIAGNOSIS — R569 Unspecified convulsions: Secondary | ICD-10-CM | POA: Diagnosis not present

## 2020-10-16 DIAGNOSIS — Z808 Family history of malignant neoplasm of other organs or systems: Secondary | ICD-10-CM | POA: Diagnosis not present

## 2020-10-16 DIAGNOSIS — R52 Pain, unspecified: Secondary | ICD-10-CM | POA: Diagnosis not present

## 2020-10-16 DIAGNOSIS — R55 Syncope and collapse: Secondary | ICD-10-CM | POA: Diagnosis not present

## 2020-10-16 DIAGNOSIS — M25551 Pain in right hip: Secondary | ICD-10-CM | POA: Diagnosis not present

## 2020-10-16 DIAGNOSIS — R7303 Prediabetes: Secondary | ICD-10-CM | POA: Diagnosis not present

## 2020-10-16 DIAGNOSIS — Z7401 Bed confinement status: Secondary | ICD-10-CM | POA: Diagnosis not present

## 2020-10-16 DIAGNOSIS — Z20822 Contact with and (suspected) exposure to covid-19: Secondary | ICD-10-CM | POA: Diagnosis present

## 2020-10-16 DIAGNOSIS — Z87891 Personal history of nicotine dependence: Secondary | ICD-10-CM | POA: Diagnosis not present

## 2020-10-16 DIAGNOSIS — R2681 Unsteadiness on feet: Secondary | ICD-10-CM | POA: Diagnosis not present

## 2020-10-16 DIAGNOSIS — K219 Gastro-esophageal reflux disease without esophagitis: Secondary | ICD-10-CM | POA: Diagnosis not present

## 2020-10-16 DIAGNOSIS — R339 Retention of urine, unspecified: Secondary | ICD-10-CM | POA: Diagnosis not present

## 2020-10-16 DIAGNOSIS — Z8249 Family history of ischemic heart disease and other diseases of the circulatory system: Secondary | ICD-10-CM | POA: Diagnosis not present

## 2020-10-16 DIAGNOSIS — D649 Anemia, unspecified: Secondary | ICD-10-CM | POA: Diagnosis not present

## 2020-10-16 DIAGNOSIS — E875 Hyperkalemia: Secondary | ICD-10-CM | POA: Diagnosis present

## 2020-10-16 DIAGNOSIS — M1990 Unspecified osteoarthritis, unspecified site: Secondary | ICD-10-CM | POA: Diagnosis present

## 2020-10-16 DIAGNOSIS — M160 Bilateral primary osteoarthritis of hip: Secondary | ICD-10-CM | POA: Diagnosis not present

## 2020-10-16 DIAGNOSIS — E876 Hypokalemia: Secondary | ICD-10-CM | POA: Diagnosis not present

## 2020-10-16 DIAGNOSIS — I708 Atherosclerosis of other arteries: Secondary | ICD-10-CM | POA: Diagnosis not present

## 2020-10-16 DIAGNOSIS — K31819 Angiodysplasia of stomach and duodenum without bleeding: Secondary | ICD-10-CM | POA: Diagnosis present

## 2020-10-16 DIAGNOSIS — C4401 Basal cell carcinoma of skin of lip: Secondary | ICD-10-CM | POA: Diagnosis not present

## 2020-10-16 DIAGNOSIS — F1010 Alcohol abuse, uncomplicated: Secondary | ICD-10-CM | POA: Diagnosis present

## 2020-10-16 DIAGNOSIS — R41841 Cognitive communication deficit: Secondary | ICD-10-CM | POA: Diagnosis not present

## 2020-10-16 DIAGNOSIS — R1312 Dysphagia, oropharyngeal phase: Secondary | ICD-10-CM | POA: Diagnosis not present

## 2020-10-16 DIAGNOSIS — Z801 Family history of malignant neoplasm of trachea, bronchus and lung: Secondary | ICD-10-CM | POA: Diagnosis not present

## 2020-10-16 DIAGNOSIS — M109 Gout, unspecified: Secondary | ICD-10-CM | POA: Diagnosis present

## 2020-10-16 DIAGNOSIS — R531 Weakness: Secondary | ICD-10-CM | POA: Diagnosis not present

## 2020-10-16 DIAGNOSIS — I739 Peripheral vascular disease, unspecified: Secondary | ICD-10-CM | POA: Diagnosis not present

## 2020-10-16 DIAGNOSIS — M1611 Unilateral primary osteoarthritis, right hip: Secondary | ICD-10-CM | POA: Diagnosis not present

## 2020-10-16 DIAGNOSIS — L89151 Pressure ulcer of sacral region, stage 1: Secondary | ICD-10-CM | POA: Diagnosis present

## 2020-10-16 DIAGNOSIS — Z9181 History of falling: Secondary | ICD-10-CM | POA: Diagnosis not present

## 2020-10-16 DIAGNOSIS — Z8041 Family history of malignant neoplasm of ovary: Secondary | ICD-10-CM | POA: Diagnosis not present

## 2020-10-16 DIAGNOSIS — M2550 Pain in unspecified joint: Secondary | ICD-10-CM | POA: Diagnosis not present

## 2020-10-16 LAB — CBC WITH DIFFERENTIAL/PLATELET
Abs Immature Granulocytes: 0.04 10*3/uL (ref 0.00–0.07)
Basophils Absolute: 0 10*3/uL (ref 0.0–0.1)
Basophils Relative: 1 %
Eosinophils Absolute: 0 10*3/uL (ref 0.0–0.5)
Eosinophils Relative: 0 %
HCT: 29.7 % — ABNORMAL LOW (ref 39.0–52.0)
Hemoglobin: 10.1 g/dL — ABNORMAL LOW (ref 13.0–17.0)
Immature Granulocytes: 1 %
Lymphocytes Relative: 9 %
Lymphs Abs: 0.7 10*3/uL (ref 0.7–4.0)
MCH: 31.3 pg (ref 26.0–34.0)
MCHC: 34 g/dL (ref 30.0–36.0)
MCV: 92 fL (ref 80.0–100.0)
Monocytes Absolute: 1 10*3/uL (ref 0.1–1.0)
Monocytes Relative: 13 %
Neutro Abs: 5.8 10*3/uL (ref 1.7–7.7)
Neutrophils Relative %: 76 %
Platelets: 231 10*3/uL (ref 150–400)
RBC: 3.23 MIL/uL — ABNORMAL LOW (ref 4.22–5.81)
RDW: 12.9 % (ref 11.5–15.5)
WBC: 7.6 10*3/uL (ref 4.0–10.5)
nRBC: 0 % (ref 0.0–0.2)

## 2020-10-16 LAB — COMPREHENSIVE METABOLIC PANEL
ALT: 32 U/L (ref 0–44)
ALT: 40 U/L (ref 0–44)
ALT: 45 U/L — ABNORMAL HIGH (ref 0–44)
AST: 58 U/L — ABNORMAL HIGH (ref 15–41)
AST: 97 U/L — ABNORMAL HIGH (ref 15–41)
AST: 106 U/L — ABNORMAL HIGH (ref 15–41)
Albumin: 3.1 g/dL — ABNORMAL LOW (ref 3.5–5.0)
Albumin: 3.2 g/dL — ABNORMAL LOW (ref 3.5–5.0)
Albumin: 3.3 g/dL — ABNORMAL LOW (ref 3.5–5.0)
Alkaline Phosphatase: 39 U/L (ref 38–126)
Alkaline Phosphatase: 39 U/L (ref 38–126)
Alkaline Phosphatase: 46 U/L (ref 38–126)
Anion gap: 12 (ref 5–15)
Anion gap: 15 (ref 5–15)
Anion gap: 13 (ref 5–15)
BUN: 9 mg/dL (ref 8–23)
BUN: 6 mg/dL — ABNORMAL LOW (ref 8–23)
BUN: 7 mg/dL — ABNORMAL LOW (ref 8–23)
CO2: 20 mmol/L — ABNORMAL LOW (ref 22–32)
CO2: 21 mmol/L — ABNORMAL LOW (ref 22–32)
CO2: 21 mmol/L — ABNORMAL LOW (ref 22–32)
Calcium: 6.8 mg/dL — ABNORMAL LOW (ref 8.9–10.3)
Calcium: 7.1 mg/dL — ABNORMAL LOW (ref 8.9–10.3)
Calcium: 7 mg/dL — ABNORMAL LOW (ref 8.9–10.3)
Chloride: 103 mmol/L (ref 98–111)
Chloride: 102 mmol/L (ref 98–111)
Chloride: 102 mmol/L (ref 98–111)
Creatinine, Ser: 0.85 mg/dL (ref 0.61–1.24)
Creatinine, Ser: 0.87 mg/dL (ref 0.61–1.24)
Creatinine, Ser: 0.96 mg/dL (ref 0.61–1.24)
GFR, Estimated: >60 mL/min (ref >60)
GFR, Estimated: >60 mL/min (ref >60)
GFR, Estimated: >60 mL/min (ref >60)
Glucose, Bld: 131 mg/dL — ABNORMAL HIGH (ref 70–99)
Glucose, Bld: 110 mg/dL — ABNORMAL HIGH (ref 70–99)
Glucose, Bld: 144 mg/dL — ABNORMAL HIGH (ref 70–99)
Potassium: 3.2 mmol/L — ABNORMAL LOW (ref 3.5–5.1)
Potassium: 3.1 mmol/L — ABNORMAL LOW (ref 3.5–5.1)
Potassium: 3.6 mmol/L (ref 3.5–5.1)
Sodium: 135 mmol/L (ref 135–145)
Sodium: 138 mmol/L (ref 135–145)
Sodium: 136 mmol/L (ref 135–145)
Total Bilirubin: 0.6 mg/dL (ref 0.3–1.2)
Total Bilirubin: 1.1 mg/dL (ref 0.3–1.2)
Total Bilirubin: 1.1 mg/dL (ref 0.3–1.2)
Total Protein: 5.8 g/dL — ABNORMAL LOW (ref 6.5–8.1)
Total Protein: 5.7 g/dL — ABNORMAL LOW (ref 6.5–8.1)
Total Protein: 6.1 g/dL — ABNORMAL LOW (ref 6.5–8.1)

## 2020-10-16 LAB — CBC
HCT: 31.5 % — ABNORMAL LOW (ref 39.0–52.0)
Hemoglobin: 10.9 g/dL — ABNORMAL LOW (ref 13.0–17.0)
MCH: 31.6 pg (ref 26.0–34.0)
MCHC: 34.6 g/dL (ref 30.0–36.0)
MCV: 91.3 fL (ref 80.0–100.0)
Platelets: 237 10*3/uL (ref 150–400)
RBC: 3.45 MIL/uL — ABNORMAL LOW (ref 4.22–5.81)
RDW: 12.8 % (ref 11.5–15.5)
WBC: 7.4 10*3/uL (ref 4.0–10.5)
nRBC: 0 % (ref 0.0–0.2)

## 2020-10-16 LAB — BLOOD GAS, VENOUS
Acid-base deficit: 0.5 mmol/L (ref 0.0–2.0)
Bicarbonate: 23 mmol/L (ref 20.0–28.0)
Drawn by: 2658
FIO2: UNDETERMINED
O2 Saturation: 35.3 %
Patient temperature: 37
pCO2, Ven: 34.1 mmHg — ABNORMAL LOW (ref 44.0–60.0)
pH, Ven: 7.444 — ABNORMAL HIGH (ref 7.250–7.430)
pO2, Ven: <31 mmHg — CL (ref 32.0–45.0)

## 2020-10-16 LAB — LACTIC ACID, PLASMA
Lactic Acid, Venous: 5.6 mmol/L (ref 0.5–1.9)
Lactic Acid, Venous: 1.3 mmol/L (ref 0.5–1.9)

## 2020-10-16 LAB — MAGNESIUM
Magnesium: 1 mg/dL — ABNORMAL LOW (ref 1.7–2.4)
Magnesium: 0.8 mg/dL — CL (ref 1.7–2.4)
Magnesium: 1.8 mg/dL (ref 1.7–2.4)

## 2020-10-16 LAB — CBG MONITORING, ED
Glucose-Capillary: 100 mg/dL — ABNORMAL HIGH (ref 70–99)
Glucose-Capillary: 111 mg/dL — ABNORMAL HIGH (ref 70–99)

## 2020-10-16 LAB — GLUCOSE, CAPILLARY
Glucose-Capillary: 122 mg/dL — ABNORMAL HIGH (ref 70–99)
Glucose-Capillary: 149 mg/dL — ABNORMAL HIGH (ref 70–99)

## 2020-10-16 LAB — BETA-HYDROXYBUTYRIC ACID: Beta-Hydroxybutyric Acid: 3.08 mmol/L — ABNORMAL HIGH (ref 0.05–0.27)

## 2020-10-16 LAB — PHOSPHORUS
Phosphorus: 2.5 mg/dL (ref 2.5–4.6)
Phosphorus: 2.9 mg/dL (ref 2.5–4.6)

## 2020-10-16 LAB — TROPONIN I (HIGH SENSITIVITY): Troponin I (High Sensitivity): 14 ng/L (ref <18)

## 2020-10-16 IMAGING — CT CT ABD-PELV W/ CM
2 of 5 series · 15 of 46 positions shown, 17 images · IV contrast (Omni 300)
Comparison: CT right hip [DATE]

CLINICAL DATA: Cancer of unknown primary. Staging for malignancy in
a patient with weakness, poor appetite, and urinary retention.

EXAM:
CT ABDOMEN AND PELVIS WITH CONTRAST
TECHNIQUE: Multidetector CT imaging of the abdomen and pelvis was performed
using the standard protocol following bolus administration of
intravenous contrast.
CONTRAST:  100mL OMNIPAQUE IOHEXOL 300 MG/ML  SOLN

[Series 3: a/p w/ 5mm · axial · 0.73mm/px · z∈[+816,+1251]mm · 12 of 97 slices shown, 14 images]
[im 5/97  soft-tissue]
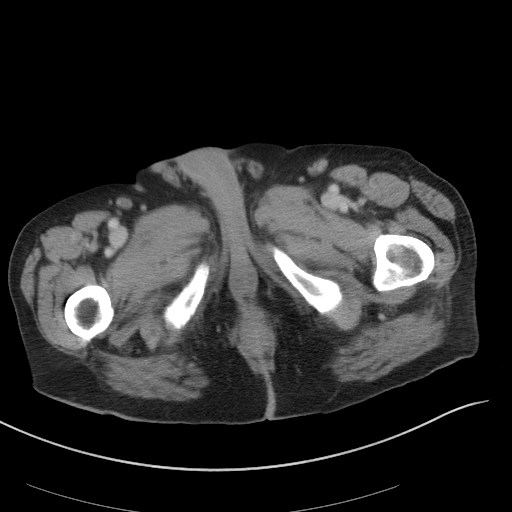
[im 5/97  bone]
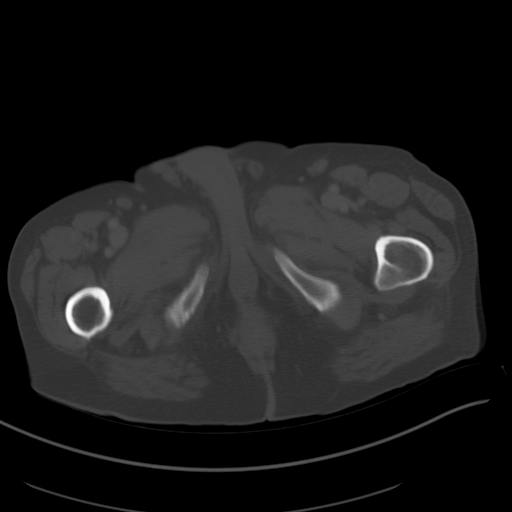
[im 15/97  soft-tissue]
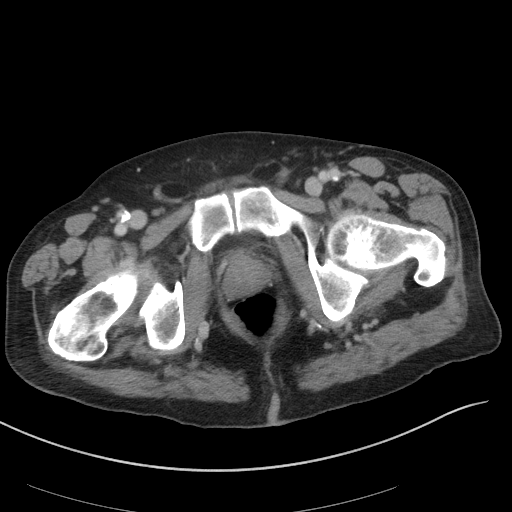
[im 20/97  soft-tissue]
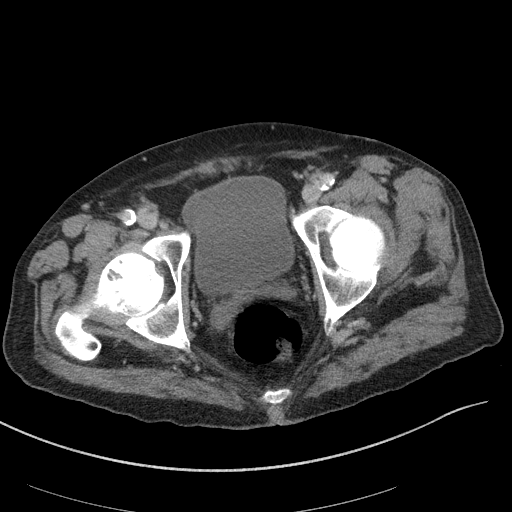
[im 29/97  soft-tissue]
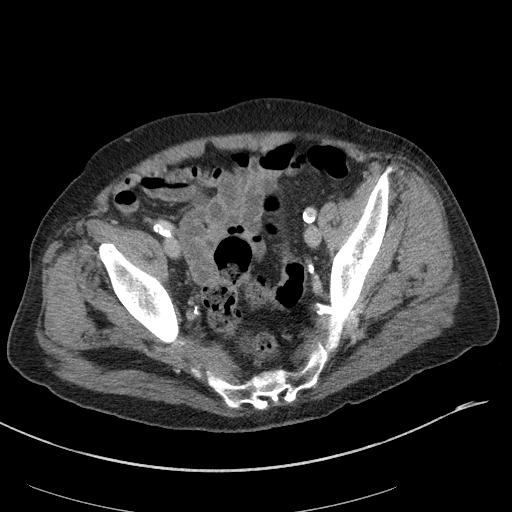
[im 39/97  soft-tissue]
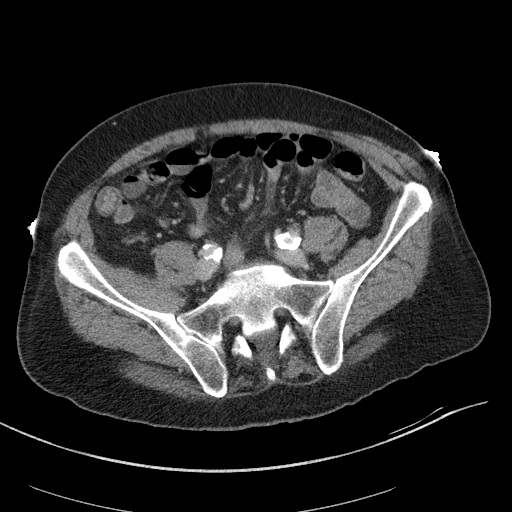
[im 44/97  soft-tissue]
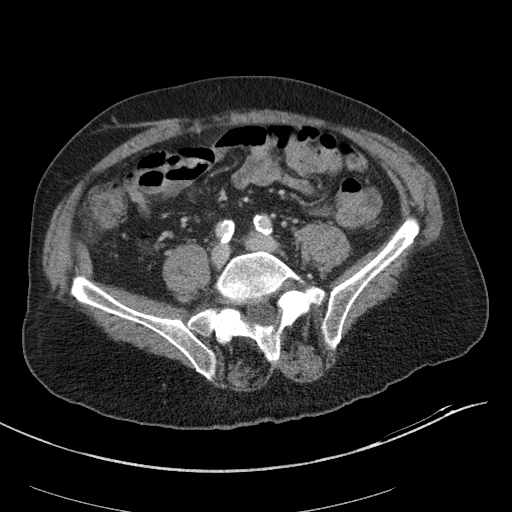
[im 53/97  soft-tissue]
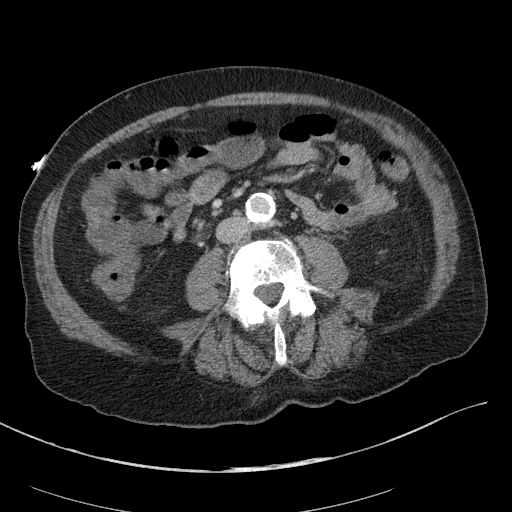
[im 58/97  soft-tissue]
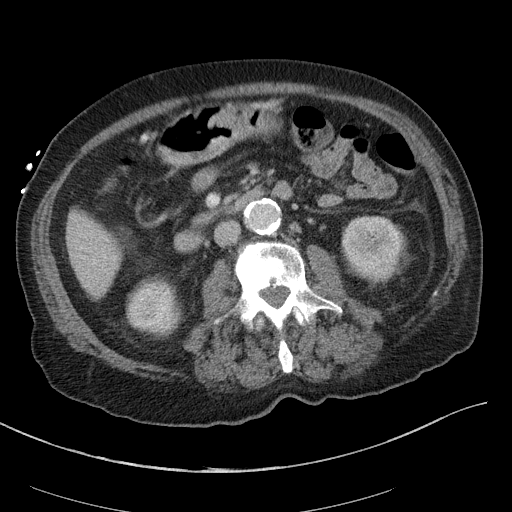
[im 68/97  soft-tissue]
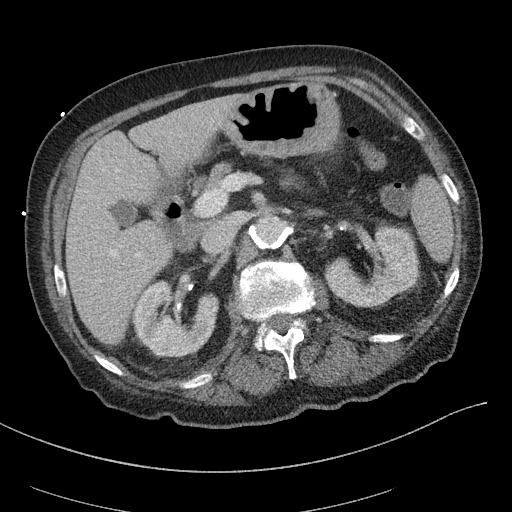
[im 68/97  bone]
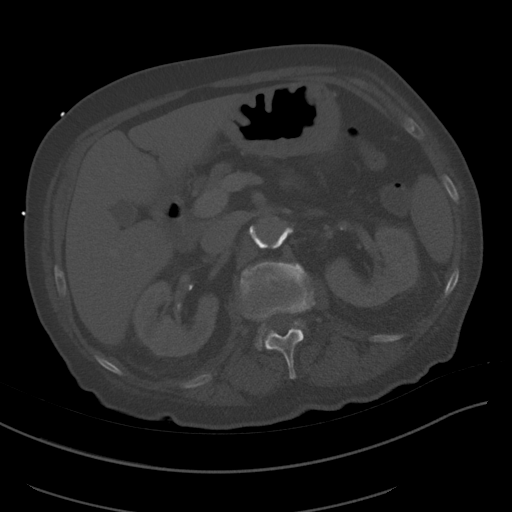
[im 77/97  soft-tissue]
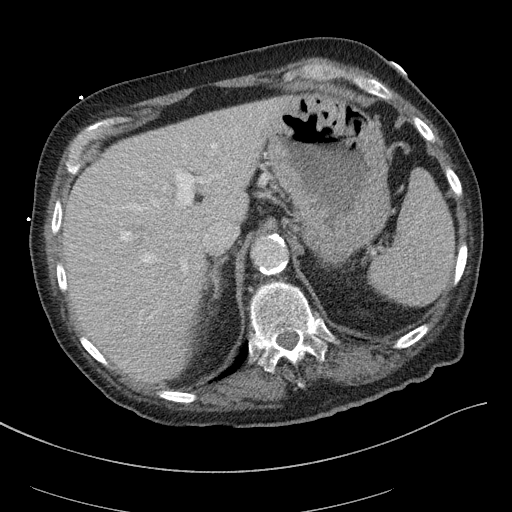
[im 82/97  soft-tissue]
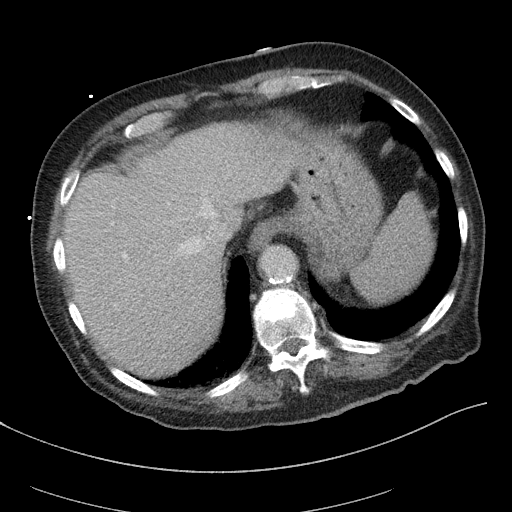
[im 92/97  soft-tissue]
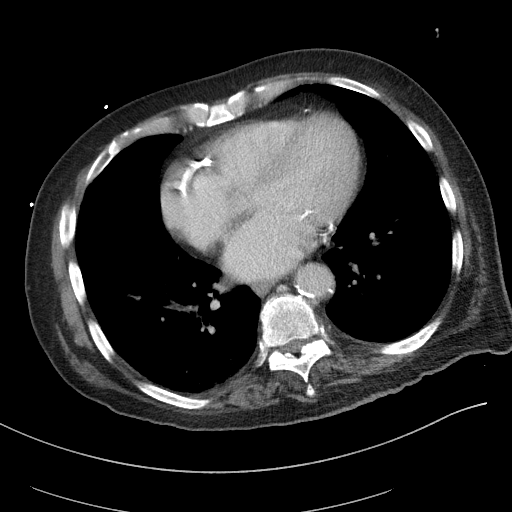

[Series 5: a/p w/ cor · coronal · 0.67mm/px · 3 of 150 slices shown]
[im 50/150  soft-tissue]
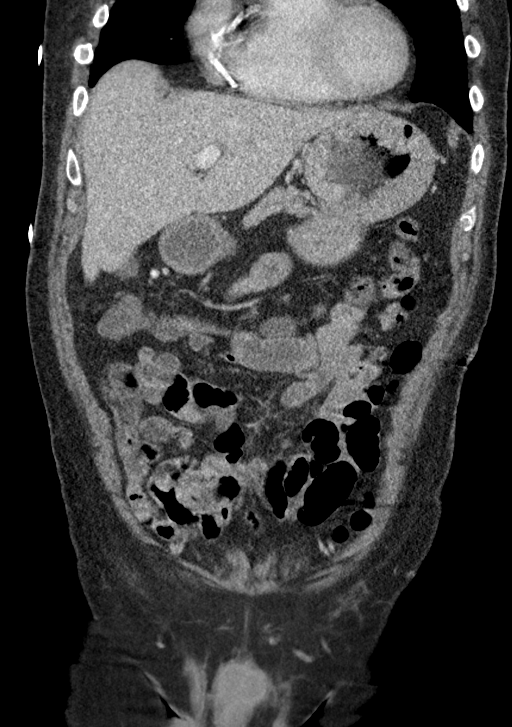
[im 67/150  soft-tissue]
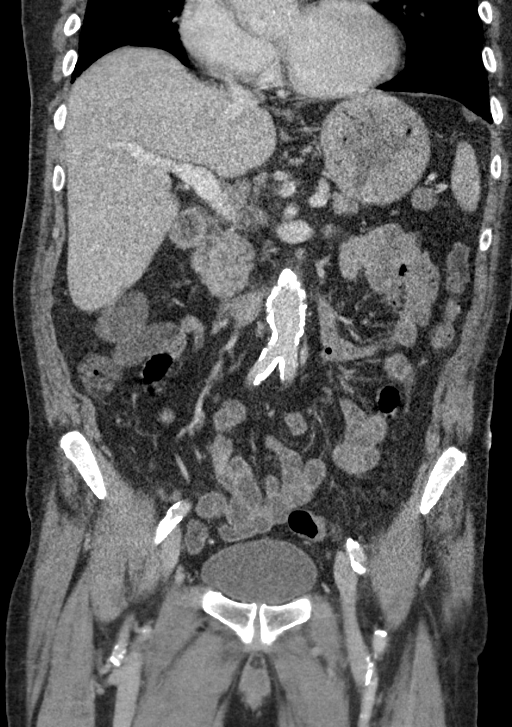
[im 83/150  soft-tissue]
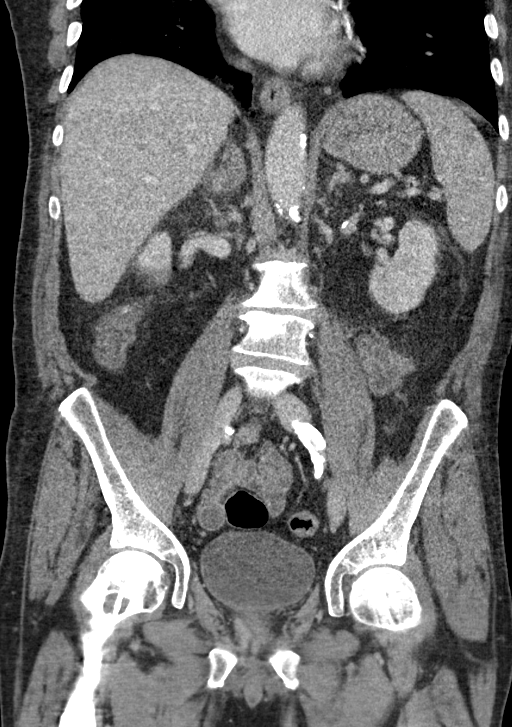

[15 of 46 positions shown; findings below may reference images not displayed]

FINDINGS: Lower chest: Atelectasis in the lung bases. Cardiac enlargement.
Coronary artery calcifications.

Hepatobiliary: No focal liver abnormality is seen. No gallstones,
gallbladder wall thickening, or biliary dilatation.

Pancreas: Unremarkable. No pancreatic ductal dilatation or
surrounding inflammatory changes.

Spleen: Normal in size without focal abnormality.

Adrenals/Urinary Tract: Adrenal glands are unremarkable. Kidneys are
normal, without renal calculi, focal lesion, or hydronephrosis.
Bladder is unremarkable.

Stomach/Bowel: Stomach, small bowel, and colon are not abnormally
distended. No wall thickening or inflammatory infiltration
identified. Appendix is normal.

Vascular/Lymphatic: Diffuse calcification of the aorta. Small aortic
aneurysm measuring 3 cm AP dimension. No significant
lymphadenopathy.

Reproductive: Prostate is unremarkable.

Other: No free air or free fluid in the abdomen. Abdominal wall
musculature appears intact. Subcutaneous soft tissue gas in the
anterior abdomen likely represents injection site.

Musculoskeletal: Degenerative changes in the lumbar spine and hips.
Degenerative cyst in the right acetabulum. Degenerative cyst versus
bone cyst in the right proximal femur. No destructive bone lesions
identified.
IMPRESSION: 1. No evidence of primary or metastatic disease in the abdomen or
pelvis.
2. Cardiac enlargement with coronary artery calcifications.
3. Small aortic aneurysm measuring 3 cm AP dimension.
4. Degenerative changes in the lumbar spine and hips.
5. Degenerative cyst versus bone cyst in the right proximal femur.

Aortic Atherosclerosis ([FJ]-[FJ]).

## 2020-10-16 MED ORDER — POTASSIUM CHLORIDE 20 MEQ PO PACK
40.0000 meq | PACK | Freq: Once | ORAL | Status: AC
  Administered 2020-10-16: 40 meq via ORAL
  Filled 2020-10-16: qty 2

## 2020-10-16 MED ORDER — IOHEXOL 300 MG/ML  SOLN
100.0000 mL | Freq: Once | INTRAMUSCULAR | Status: AC | PRN
  Administered 2020-10-16: 100 mL via INTRAVENOUS

## 2020-10-16 MED ORDER — HALOPERIDOL LACTATE 5 MG/ML IJ SOLN
2.0000 mg | Freq: Four times a day (QID) | INTRAMUSCULAR | Status: DC | PRN
  Administered 2020-10-16 – 2020-10-21 (×4): 2 mg via INTRAVENOUS
  Filled 2020-10-16 (×4): qty 1

## 2020-10-16 MED ORDER — BARRIER CREAM NON-SPECIFIED
1.0000 "application " | TOPICAL_CREAM | Freq: Two times a day (BID) | TOPICAL | Status: DC | PRN
  Filled 2020-10-16: qty 1

## 2020-10-16 MED ORDER — MAGNESIUM SULFATE 4 GM/100ML IV SOLN
4.0000 g | Freq: Once | INTRAVENOUS | Status: AC
  Administered 2020-10-16: 4 g via INTRAVENOUS
  Filled 2020-10-16 (×2): qty 100

## 2020-10-16 MED ORDER — DIPHENHYDRAMINE HCL 25 MG PO CAPS
50.0000 mg | ORAL_CAPSULE | Freq: Once | ORAL | Status: AC
  Administered 2020-10-16: 50 mg via ORAL
  Filled 2020-10-16: qty 2

## 2020-10-16 NOTE — Consult Note (Signed)
Neurology Consultation Reason for Consult: code stroke for aphasia  Referring Physician: Shawna Clamp  CC: "I passed out"  History is obtained from: Patient and chart review   HPI: Devin Garza is a 79 y.o. male with a past medical history significant for hypertension, hyperlipidemia, peripheral vascular disease, tobacco abuse, colon adenocarcinoma, basal cell carcinoma, squamous cell carcinomas, presenting from his assisted living facility after being found down.  He was reportedly talking to a friend at 5:55 PM when he began to yell for help and was noted to have right facial droop and have fallen by facility personnel.  On my evaluation, he was able to tell me he had been talking to someone but was quite confused on details of recent events, though he did report being aware that he had fallen and was confused by the amount of blood around him.  History is somewhat limited by lingering confusion, but he denied any significant headache, vision changes, any prodrome prior to this event, bowel or bladder incontinence that he was aware of, tongue biting (despite some dried blood around his mouth), and complained only of some right hip pain without any other focal weakness numbness or tingling.  He denied any recent infectious symptoms including fevers, sweats, chills, cough, cold, shortness of breath, abdominal pain, nausea, vomiting; he additionally denied cardiac symptoms such as chest pain, chest pressure, palpitations, new lower extremity edema, orthopnea.  Over the course of my evaluation, his mental status was clearing.  Later the patient had another episode of loss of consciousness, this time witnessed by ED nurse Ariel, who reported she first noticed his heart rates had gone up into the 150s, and she observed that he was shaking all 4 of his limbs with midline gaze, no head turn, no gaze deviation, with episode resolving on its own within a few minutes.  The patient was again confused after this  event but did not have any focal neurological signs or symptoms on ED provider and admitting physicians examinations.  By the time of my evaluation a short while later, he was no longer confused.  LKW: 5:55 PM tPA given?: No, due to not a stroke  Past Medical History:  Diagnosis Date  . Anemia   . Basal cell adenocarcinoma 09/03/2008   Qualifier: Diagnosis of  By: Lorelei Pont MD, Frederico Hamman    Overview:  Overview:  Qualifier: Diagnosis of  By: Lorelei Pont MD, Frederico Hamman   . Basal cell carcinoma of lip 2007   multiple sites  . Blood transfusion without reported diagnosis   . ED (erectile dysfunction)   . GERD (gastroesophageal reflux disease)   . HLD (hyperlipidemia)   . Hx of squamous cell carcinoma of skin 08/25/2016   L superior pectoral  . Hypertension   . Osteoarthritis   . Peripheral vascular disease ([place])    Occlusion and Stenosis of Carotid Artery  . Tobacco abuse   . Tubular adenoma of colon 2005  . Wears dentures    full upper and lower    Family History  Problem Relation Age of Onset  . Lung cancer Mother 41  . Hearing loss Father   . Throat cancer Father   . Heart attack Father   . CAD Father   . Ovarian cancer Sister     Social History:  reports that he quit smoking about 7 months ago. His smoking use included cigarettes. He has a 20.00 pack-year smoking history. He has never used smokeless tobacco. He reports current alcohol use of about 14.0 standard  drinks of alcohol per week. He reports that he does not use drugs. Exam:    Current vital signs: BP 121/64   Pulse 76   Temp 97.9 F (36.6 C) (Oral)   Resp 18   Ht 5\' 7"  (1.702 m)   Wt 80 kg   SpO2 100%   BMI 27.62 kg/m  Vital signs in last 24 hours: Temp:  [97.9 F (36.6 C)] 97.9 F (36.6 C) (10/20 1941) Pulse Rate:  [71-105] 76 (10/21 0653) Resp:  [9-28] 18 (10/21 0648) BP: (91-144)/(56-106) 121/64 (10/21 0653) SpO2:  [92 %-100 %] 100 % (10/21 0648) Weight:  [80 kg] 80 kg (10/20 1942)   Physical Exam   Constitutional: Appears well-developed and well-nourished.  Psych: Affect appropriate to situation Eyes: No scleral injection HENT: No OP obstrucion MSK: no joint deformities.  Cardiovascular: Normal rate and regular rhythm.  Respiratory: Effort normal, non-labored breathing GI: Soft.  No distension. There is no tenderness.  Skin: WDI  Neuro: Mental Status: Patient is awake, alert, oriented to person, place, month, year, and situation. No signs of aphasia or neglect on later exam Cranial Nerves: II: Visual Fields are full. Pupils are equal, round, and reactive to light.  III,IV, VI: EOMI without ptosis or diploplia.  V: Facial sensation is symmetric to temperature VII: Facial movement is symmetric.  VIII: hearing is intact to voice X: Uvula elevates symmetrically XI: Shoulder shrug is symmetric. XII: tongue is midline without atrophy or fasciculations.  Motor: Tone is normal. Bulk is normal. 5/5 strength was present in all four extremities.  Sensory: Sensation is symmetric to light touch and temperature in the arms and legs. Deep Tendon Reflexes: 3+ and symmetric in the biceps and patellae.  Plantars: Toes are downgoing bilaterally.  Cerebellar: FNF and HKS are intact bilaterally  I have reviewed labs in epic and the results pertinent to this consultation are: Undetectable magnesium on admission, hypokalemia to 2.7, low ionized calcium of 0.71, creatinine 1.1, GFR greater than 60   I have reviewed the images obtained:  Dry head CT without acute intracranial process CTA without large vessel occlusion CT perfusion without core or tissue at risk   Impression: This is a gentleman presenting with profound electrolyte derangements resulting in generalized tonic-clonic seizures.  He will remain at risk for seizures as long as he continues to have such severe electrolyte derangements, appreciate ED and primary team's efforts in elucidating the etiology of the same.  No  indication for antiseizure medication based on work-up to date.  However will obtain an MRI brain with and without contrast given the patient's age to rule out malignancy and confirm there was no small stroke.  Recommendations: -MRI brain with and without contrast -Should this study be negative for acute intracranial process no further neurological work-up is needed inpatient; patient should, in that case, have outpatient neurology follow-up -Neurology will sign off at this time, but please reinvolve [place] if new questions arise   [place] [phone]

## 2020-10-16 NOTE — ED Notes (Signed)
Pt pulled off condom cath off.  St's he does not want it

## 2020-10-16 NOTE — Procedures (Signed)
Patient Name: Dionne Rossa  MRN: [id number]  Epilepsy Attending: Lora Havens  Referring Physician/Provider:  Dr Inda Merlin Date: 10/16/2020 Duration: 24.14 mins  Patient history: 79 year old male who presented with generalized tonic-clonic seizure in the setting of profound electrolyte derangements.  EEG to evaluate for seizures.  Level of alertness: Awake, asleep  AEDs during EEG study: Ativan  Technical aspects: This EEG study was done with scalp electrodes positioned according to the 10-20 International system of electrode placement. Electrical activity was acquired at a sampling rate of 500Hz  and reviewed with a high frequency filter of 70Hz  and a low frequency filter of 1Hz . EEG data were recorded continuously and digitally stored.   Description: The posterior dominant rhythm consists of 9-10 Hz activity of moderate voltage (25-35 uV) seen predominantly in posterior head regions, symmetric and reactive to eye opening and eye closing. Sleep was characterized by vertex waves, sleep spindles (12 to 14 Hz), maximal frontocentral region.  There is an excessive amount of 15 to 18 Hz  beta activity distributed symmetrically and diffusely.   Hyperventilation and photic stimulation were not performed.     ABNORMALITY -Excessive beta, generalized  IMPRESSION: This study is within normal limits. No seizures or epileptiform discharges were seen throughout the recording. The excessive beta activity seen in the background is most likely due to the effect of benzodiazepine and is a benign EEG pattern.   Jearldean Gutt Barbra Sarks

## 2020-10-16 NOTE — ED Notes (Addendum)
Sister: [name] [phone]  [name] [phone]

## 2020-10-16 NOTE — Progress Notes (Signed)
Yellow mews score MD aware patient is agitated and tachy due to ETOH withdrawal we continue observation via CIWA scale. No new orders noted.   10/16/20 1602  Vitals  Temp 98.1 F (36.7 C)  BP (!) 135/93  Pulse Rate 80  ECG Heart Rate 80  Resp (!) 26  Level of Consciousness  Level of Consciousness Alert  MEWS COLOR  MEWS Score Color Yellow  Oxygen Therapy  SpO2 100 %  O2 Device Room Air  Pain Assessment  Pain Scale 0-10  Pain Score 0  Height and Weight  Height 5\' 10"  (1.778 m)  Weight 78.9 kg  Type of Scale Used Standing  Type of Weight Actual  BSA (Calculated - sq m) 1.97 sq meters  BMI (Calculated) 24.95  Weight in (lb) to have BMI = 25 173.9  Glasgow Coma Scale  Eye Opening 4  Best Verbal Response (NON-intubated) 4  Best Motor Response 5  Gl[place] Coma Scale Score 13  MEWS Score  MEWS Temp 0  MEWS Systolic 0  MEWS Pulse 0  MEWS RR 2  MEWS LOC 0  MEWS Score 2  Provider Notification  Provider Name/Title  (MD aware has been yellow since arrival.)  Date Provider Notified 10/16/20  Time Provider Notified 1600  Notification Type Rounds  Notification Reason Other (Comment)  Response No new orders  Date of Provider Response 10/16/20  Time of Provider Response 1630  Rapid Response Notification  Name of Rapid Response RN Notified not this time

## 2020-10-16 NOTE — Plan of Care (Signed)
Problem: Education: Goal: Knowledge of condition and prescribed therapy will improve 10/16/2020 1957 by Arlina Robes, RN Outcome: Progressing 10/16/2020 1957 by Arlina Robes, RN Outcome: Progressing 10/16/2020 1956 by Arlina Robes, RN Outcome: Progressing   Problem: Cardiac: Goal: Will achieve and/or maintain adequate cardiac output 10/16/2020 1957 by Arlina Robes, RN Outcome: Progressing 10/16/2020 1957 by Arlina Robes, RN Outcome: Progressing 10/16/2020 1956 by Arlina Robes, RN Outcome: Progressing   Problem: Physical Regulation: Goal: Complications related to the disease process, condition or treatment will be avoided or minimized 10/16/2020 1957 by Arlina Robes, RN Outcome: Progressing 10/16/2020 1957 by Arlina Robes, RN Outcome: Progressing 10/16/2020 1956 by Arlina Robes, RN Outcome: Progressing   Problem: Education: Goal: Knowledge of General Education information will improve Description: Including pain rating scale, medication(s)/side effects and non-pharmacologic comfort measures 10/16/2020 1957 by Arlina Robes, RN Outcome: Progressing 10/16/2020 1957 by Arlina Robes, RN Outcome: Progressing 10/16/2020 1956 by Arlina Robes, RN Outcome: Progressing   Problem: Health Behavior/Discharge Planning: Goal: Ability to manage health-related needs will improve 10/16/2020 1957 by Arlina Robes, RN Outcome: Progressing 10/16/2020 1957 by Arlina Robes, RN Outcome: Progressing 10/16/2020 1956 by Arlina Robes, RN Outcome: Progressing   Problem: Clinical Measurements: Goal: Ability to maintain clinical measurements within normal limits will improve 10/16/2020 1957 by Arlina Robes, RN Outcome: Progressing 10/16/2020 1957 by Arlina Robes, RN Outcome: Progressing 10/16/2020 1956 by Arlina Robes, RN Outcome: Progressing Goal: Will remain free from  infection 10/16/2020 1957 by Arlina Robes, RN Outcome: Progressing 10/16/2020 1957 by Arlina Robes, RN Outcome: Progressing 10/16/2020 1956 by Arlina Robes, RN Outcome: Progressing Goal: Diagnostic test results will improve 10/16/2020 1957 by Arlina Robes, RN Outcome: Progressing 10/16/2020 1957 by Arlina Robes, RN Outcome: Progressing 10/16/2020 1956 by Arlina Robes, RN Outcome: Progressing Goal: Respiratory complications will improve 10/16/2020 1957 by Arlina Robes, RN Outcome: Progressing 10/16/2020 1957 by Arlina Robes, RN Outcome: Progressing 10/16/2020 1956 by Arlina Robes, RN Outcome: Progressing Goal: Cardiovascular complication will be avoided 10/16/2020 1957 by Arlina Robes, RN Outcome: Progressing 10/16/2020 1957 by Arlina Robes, RN Outcome: Progressing 10/16/2020 1956 by Arlina Robes, RN Outcome: Progressing   Problem: Activity: Goal: Risk for activity intolerance will decrease 10/16/2020 1957 by Arlina Robes, RN Outcome: Progressing 10/16/2020 1957 by Arlina Robes, RN Outcome: Progressing 10/16/2020 1956 by Arlina Robes, RN Outcome: Progressing   Problem: Nutrition: Goal: Adequate nutrition will be maintained 10/16/2020 1957 by Arlina Robes, RN Outcome: Progressing 10/16/2020 1957 by Arlina Robes, RN Outcome: Progressing 10/16/2020 1956 by Arlina Robes, RN Outcome: Progressing   Problem: Coping: Goal: Level of anxiety will decrease 10/16/2020 1957 by Arlina Robes, RN Outcome: Progressing 10/16/2020 1957 by Arlina Robes, RN Outcome: Progressing 10/16/2020 1956 by Arlina Robes, RN Outcome: Progressing   Problem: Elimination: Goal: Will not experience complications related to bowel motility 10/16/2020 1957 by Arlina Robes, RN Outcome: Progressing 10/16/2020 1957 by Arlina Robes, RN Outcome:  Progressing 10/16/2020 1956 by Arlina Robes, RN Outcome: Progressing Goal: Will not experience complications related to urinary retention 10/16/2020 1957 by Arlina Robes, RN Outcome: Progressing 10/16/2020 1957 by Arlina Robes, RN Outcome: Progressing 10/16/2020 1956 by Arlina Robes, RN Outcome: Progressing   Problem: Pain Managment: Goal: General experience of comfort will improve 10/16/2020 1957 by Arlina Robes, RN Outcome: Progressing  10/16/2020 1957 by Arlina Robes, RN Outcome: Progressing 10/16/2020 1956 by Arlina Robes, RN Outcome: Progressing   Problem: Safety: Goal: Ability to remain free from injury will improve 10/16/2020 1957 by Arlina Robes, RN Outcome: Progressing 10/16/2020 1957 by Arlina Robes, RN Outcome: Progressing 10/16/2020 1956 by Arlina Robes, RN Outcome: Progressing   Problem: Skin Integrity: Goal: Risk for impaired skin integrity will decrease 10/16/2020 1957 by Arlina Robes, RN Outcome: Progressing 10/16/2020 1957 by Arlina Robes, RN Outcome: Progressing 10/16/2020 1956 by Arlina Robes, RN Outcome: Progressing   Problem: Education: Goal: Expressions of having a comfortable level of knowledge regarding the disease process will increase 10/16/2020 1957 by Arlina Robes, RN Outcome: Progressing 10/16/2020 1957 by Arlina Robes, RN Outcome: Progressing   Problem: Coping: Goal: Ability to adjust to condition or change in health will improve 10/16/2020 1957 by Arlina Robes, RN Outcome: Progressing 10/16/2020 1957 by Arlina Robes, RN Outcome: Progressing Goal: Ability to identify appropriate support needs will improve 10/16/2020 1957 by Arlina Robes, RN Outcome: Progressing 10/16/2020 1957 by Arlina Robes, RN Outcome: Progressing   Problem: Health Behavior/Discharge Planning: Goal: Compliance with prescribed medication regimen will  improve 10/16/2020 1957 by Arlina Robes, RN Outcome: Progressing 10/16/2020 1957 by Arlina Robes, RN Outcome: Progressing   Problem: Medication: Goal: Risk for medication side effects will decrease 10/16/2020 1957 by Arlina Robes, RN Outcome: Progressing 10/16/2020 1957 by Arlina Robes, RN Outcome: Progressing   Problem: Clinical Measurements: Goal: Complications related to the disease process, condition or treatment will be avoided or minimized 10/16/2020 1957 by Arlina Robes, RN Outcome: Progressing 10/16/2020 1957 by Arlina Robes, RN Outcome: Progressing Goal: Diagnostic test results will improve 10/16/2020 1957 by Arlina Robes, RN Outcome: Progressing 10/16/2020 1957 by Arlina Robes, RN Outcome: Progressing   Problem: Safety: Goal: Verbalization of understanding the information provided will improve 10/16/2020 1957 by Arlina Robes, RN Outcome: Progressing 10/16/2020 1957 by Arlina Robes, RN Outcome: Progressing   Problem: Self-Concept: Goal: Level of anxiety will decrease 10/16/2020 1957 by Arlina Robes, RN Outcome: Progressing 10/16/2020 1957 by Arlina Robes, RN Outcome: Progressing Goal: Ability to verbalize feelings about condition will improve 10/16/2020 1957 by Arlina Robes, RN Outcome: Progressing 10/16/2020 1957 by Arlina Robes, RN Outcome: Progressing

## 2020-10-16 NOTE — Progress Notes (Signed)
EEG complete - results pending 

## 2020-10-16 NOTE — Progress Notes (Signed)
PROGRESS NOTE    Devin Garza  VVO:[id number] DOB: 12-Aug-1941 DOA: 10/15/2020 PCP: Isaac Bliss, Rayford Halsted, MD    Brief Narrative: 79 years old male with PMH of hypertension hyperlipidemia osteoarthritis upper GI bleeding in 2017 secondary to gastric and duodenal AVMs, depression, basal cell carcinoma presents in the emergency department after being found down in his home.  Patient is poor historian. Most of history is obtained from EMS and ED staff.  Patient brought to the emergency department as a stroke code and was immediately seen by Neurologist Dr. Curly Shores, CT head was negative for stroke.  Patient found to have severe hypomagnesemia,  hypokalemia and hypocalcemia.  Patient had an episode of tonic-clonic activity with evidence of postictal state after an episode.  Patient was given Keppra.  Neurology recommended MRI and EEG.  EEG is negative for any seizures.  MRI is pending.   Assessment & Plan:   Principal Problem:   Seizure ([place]) Active Problems:   Mixed hyperlipidemia   Essential hypertension   Prediabetes   GERD without esophagitis   Hypokalemia   Hypomagnesemia   Hyperglycemia   Right hip pain   Acute urinary retention   History of alcohol abuse   Seizure [facility])   Patient initially presented to the emergency department after being found down in his place of residence.  Because of patient's fall was initially not clear but with the 1 minute episode of tonic-clonic seizure-like activity with notable postictal state witnessed in the emergency department it is likely that the initial episode that the patient experienced at home was also a seizure.  Etiology of seizures are possibly related to hypovolemia or severe electrolyte disturbance( low ca, Low Mag, Low K)  Per chart review patient has a history of alcohol abuse and is still actively drinking at least 2 glasses of wine daily according to outpatient notes.  If patient is drinking more heavily an element of alcohol  intoxication or withdrawal may be contributing to the seizure activity.  Chest x-ray, unremarkable, UA unremarkable.  EEG no evidence of seizures.  MR imaging of the brain per neurology recommendations is pending  Patient received 1000 mg of Keppra in the ED will continue 500 mg twice daily for now.  Seizure precautions  Monitoring patient on telemetry  As needed Ativan for repeat seizure activity  History of alcohol abuse   Patient has endorsed drinking at least 2 to 3 glasses of wine daily   Considering the patient's multiple seizures today, alcohol may be related, either be intoxication or withdrawal  We will place patient on CIWA protocol with as needed benzodiazepines for withdrawal symptoms  Placing patient on thiamine and folate supplementation  Acute urinary retention   Patient reporting lower abdominal discomfort with inability to urinate in the emergency room  UA shows hemoglobin and ketones otherwise negative for LE and nitrites.  Urine culture pending  Considering vague presentation with malaise, fatigue and seizure, we will additionally obtain CT imaging of the abdomen and pelvis to evaluate for any evidence of insidious malignancy.  CT abdomen pelvis negative for any malignancy.  No history of BPH although considering patient's age this could very well be the cause.    Essential hypertension   Continue home regimen of antihypertensives with the exception of hydrochlorothiazide as blood pressure tolerates    Prediabetes   Known history of prediabetes  Patient is hyperglycemic upon arrival  Ordering hemoglobin A1c  Accu-Cheks before every meal and nightly for now    Hypokalemia  Patient exhibiting multiple electrolyte abnormalities including substantial hypokalemia and severe hypomagnesia  Initially replacing magnesium with 4 g of intravenous magnesium sulfate  Then will proceed to provide patient with intravenous potassium  chloride  Monitoring potassium and magnesium with serial chemistries    Hypomagnesemia   Replacement given continues to remain hypomagnesemic.    Ordered magnesium sulfate 4 g IV once  Hypocalcemia   Replacing with intravenous calcium gluconate  Obtain repeat calcium level in the morning along with ionized calcium level    Right hip pain   Patient complaining of a 1 week history of right lower extremity swelling as well as right hip pain.   Hip x-ray performed here in the emergency department reveals no evidence of fracture.  On examination, patient has concurrent right lower extremity edema which the patient states is new.    Patient additionally has right hip pain that is reproducible with isolation of the right hip joint.   .   CT Hip joint No evidence of acute fracture or dislocation of the right hip.   right lower extremity ultrasound to ruled out DVT    GERD without esophagitis   Longstanding history of GERD with known history of gastric and duodenal AVMs  Transitioning patient from home regimen of Nexium to Protonix 40 mg twice daily    Mixed hyperlipidemia  Continue home regimen of statin therapy   DVT prophylaxis: SCDS Code Status: full Family Communication:  No one at bed side. Disposition Plan:   Status is: Observation  The patient remains OBS appropriate and will d/c before 2 midnights.  Dispo: The patient is from: Home              Anticipated d/c is to: SNF              Anticipated d/c date is: 1 day              Patient currently is not medically stable to d/c.   Consultants:    Neurology  Procedures:  Antimicrobials:  Anti-infectives (From admission, onward)   None     Subjective: Patient was seen and examined at bedside.  He appears alert oriented x 2.  Overnight events noted. He continues to have low magnesium , has received magnesium supplements yesterday.  Objective: Vitals:   10/16/20 1415 10/16/20 1421 10/16/20  1422 10/16/20 1607  BP: (!) 124/56 125/63  135/63  Pulse: 78 70 80   Resp: 16  (!) 26   Temp:    98.1 F (36.7 C)  TempSrc:    Oral  SpO2: 98%  100%   Weight:      Height:        Intake/Output Summary (Last 24 hours) at 10/16/2020 1708 Last data filed at 10/15/2020 2125 Gross per 24 hour  Intake 95 ml  Output --  Net 95 ml   Filed Weights   10/15/20 1942  Weight: 80 kg    Examination:  General exam: Appears calm and comfortable. Respiratory system: Clear to auscultation. Respiratory effort normal. Cardiovascular system: S1 & S2 heard, RRR. No JVD, murmurs, rubs, gallops or clicks. No pedal edema. Gastrointestinal system: Abdomen is nondistended, soft and nontender. No organomegaly or masses felt. Normal bowel sounds heard. Central nervous system: Alert and oriented. No focal neurological deficits. Extremities:  No edema, no cyanosis, no clubbing. Skin: No rashes, lesions or ulcers Psychiatry: Judgement and insight appear normal. Mood & affect appropriate.     Data Reviewed: I have personally reviewed following  labs and imaging studies  CBC: Recent Labs  Lab 10/15/20 1905 10/15/20 1916 10/15/20 2211 10/16/20 0251  WBC 6.8  --   --  7.6  NEUTROABS 4.5  --   --  5.8  HGB 12.1* 12.2* 12.2* 10.1*  HCT 36.1* 36.0* 36.0* 29.7*  MCV 92.8  --   --  92.0  PLT 267  --   --  182   Basic Metabolic Panel: Recent Labs  Lab 10/15/20 1905 10/15/20 1916 10/15/20 2211 10/16/20 0251 10/16/20 1119  NA 137 137 137 135 138  K 2.7* 2.6* 2.7* 3.2* 3.1*  CL 99 100  --  103 102  CO2 18*  --   --  20* 21*  GLUCOSE 183* 180*  --  131* 110*  BUN 10 9  --  9 6*  CREATININE 1.11 1.00  --  0.85 0.87  CALCIUM 6.7*  --   --  6.8* 7.1*  MG  --  0.2*  --  1.0* 0.8*  PHOS  --   --   --   --  2.5   GFR: Estimated Creatinine Clearance: 68.7 mL/min (by C-G formula based on SCr of 0.87 mg/dL). Liver Function Tests: Recent Labs  Lab 10/15/20 1905 10/16/20 0251 10/16/20 1119  AST  48* 58* 97*  ALT 35 32 40  ALKPHOS 45 39 39  BILITOT 0.8 0.6 1.1  PROT 6.8 5.8* 5.7*  ALBUMIN 3.7 3.1* 3.2*   No results for input(s): LIPASE, AMYLASE in the last 168 hours. No results for input(s): AMMONIA in the last 168 hours. Coagulation Profile: Recent Labs  Lab 10/15/20 1905  INR 1.2   Cardiac Enzymes: No results for input(s): CKTOTAL, CKMB, CKMBINDEX, TROPONINI in the last 168 hours. BNP (last 3 results) No results for input(s): PROBNP in the last 8760 hours. HbA1C: No results for input(s): HGBA1C in the last 72 hours. CBG: Recent Labs  Lab 10/15/20 1918 10/16/20 0810 10/16/20 1322  GLUCAP 172* 100* 111*   Lipid Profile: No results for input(s): CHOL, HDL, LDLCALC, TRIG, CHOLHDL, LDLDIRECT in the last 72 hours. Thyroid Function Tests: No results for input(s): TSH, T4TOTAL, FREET4, T3FREE, THYROIDAB in the last 72 hours. Anemia Panel: No results for input(s): VITAMINB12, FOLATE, FERRITIN, TIBC, IRON, RETICCTPCT in the last 72 hours. Sepsis Labs: Recent Labs  Lab 10/15/20 2307 10/16/20 0251  LATICACIDVEN 5.6* 1.3    Recent Results (from the past 240 hour(s))  Respiratory Panel by RT PCR (Flu A&B, Covid) - Nasopharyngeal Swab     Status: None   Collection Time: 10/15/20  9:24 PM   Specimen: Nasopharyngeal Swab  Result Value Ref Range Status   SARS Coronavirus 2 by RT PCR NEGATIVE NEGATIVE Final    Comment: (NOTE) SARS-CoV-2 target nucleic acids are NOT DETECTED.  The SARS-CoV-2 RNA is generally detectable in upper respiratoy specimens during the acute phase of infection. The lowest concentration of SARS-CoV-2 viral copies this assay can detect is 131 copies/mL. A negative result does not preclude SARS-Cov-2 infection and should not be used as the sole basis for treatment or other patient management decisions. A negative result may occur with  improper specimen collection/handling, submission of specimen other than nasopharyngeal swab, presence of viral  mutation(s) within the areas targeted by this assay, and inadequate number of viral copies (<131 copies/mL). A negative result must be combined with clinical observations, patient history, and epidemiological information. The expected result is Negative.  Fact Sheet for Patients:  PinkCheek.be  Fact Sheet for Healthcare  Providers:  GravelBags.it  This test is no t yet approved or cleared by the [place] and  has been authorized for detection and/or diagnosis of SARS-CoV-2 by FDA under an Emergency Use Authorization (EUA). This EUA will remain  in effect (meaning this test can be used) for the duration of the COVID-19 declaration under Section 564(b)(1) of the Act, 21 U.S.C. section 360bbb-3(b)(1), unless the authorization is terminated or revoked sooner.     Influenza A by PCR NEGATIVE NEGATIVE Final   Influenza B by PCR NEGATIVE NEGATIVE Final    Comment: (NOTE) The Xpert Xpress SARS-CoV-2/FLU/RSV assay is intended as an aid in  the diagnosis of influenza from Nasopharyngeal swab specimens and  should not be used as a sole basis for treatment. Nasal washings and  aspirates are unacceptable for Xpert Xpress SARS-CoV-2/FLU/RSV  testing.  Fact Sheet for Patients: PinkCheek.be  Fact Sheet for Healthcare Providers: GravelBags.it  This test is not yet approved or cleared by the [place] FDA and  has been authorized for detection and/or diagnosis of SARS-CoV-2 by  FDA under an Emergency Use Authorization (EUA). This EUA will remain  in effect (meaning this test can be used) for the duration of the  Covid-19 declaration under Section 564(b)(1) of the Act, 21  U.S.C. section 360bbb-3(b)(1), unless the authorization is  terminated or revoked. Performed at [facility], [place] [address], [place], [place] [address]          Radiology  Studies: EEG  Result Date: 10/16/2020 Lora Havens, MD     10/16/2020  2:42 PM Patient Name: Devin Garza MRN: [id number] Epilepsy Attending: Lora Havens Referring Physician/Provider:  Dr Inda Merlin Date: 10/16/2020 Duration: 24.14 mins Patient history: 79 year old male who presented with generalized tonic-clonic seizure in the setting of profound electrolyte derangements.  EEG to evaluate for seizures. Level of alertness: Awake, asleep AEDs during EEG study: Ativan Technical aspects: This EEG study was done with scalp electrodes positioned according to the 10-20 International system of electrode placement. Electrical activity was acquired at a sampling rate of 500Hz  and reviewed with a high frequency filter of 70Hz  and a low frequency filter of 1Hz . EEG data were recorded continuously and digitally stored. Description: The posterior dominant rhythm consists of 9-10 Hz activity of moderate voltage (25-35 uV) seen predominantly in posterior head regions, symmetric and reactive to eye opening and eye closing. Sleep was characterized by vertex waves, sleep spindles (12 to 14 Hz), maximal frontocentral region.  There is an excessive amount of 15 to 18 Hz  beta activity distributed symmetrically and diffusely.   Hyperventilation and photic stimulation were not performed.   ABNORMALITY -Excessive beta, generalized IMPRESSION: This study is within normal limits. No seizures or epileptiform discharges were seen throughout the recording. The excessive beta activity seen in the background is most likely due to the effect of benzodiazepine and is a benign EEG pattern. Lora Havens   DG Chest 1 View  Result Date: 10/15/2020 CLINICAL DATA:  Evaluate for pneumonia EXAM: CHEST  1 VIEW COMPARISON:  05/12/2020 FINDINGS: Cardiac shadow is mildly enlarged but stable. Aortic calcifications are seen. Lungs are well aerated bilaterally. No focal infiltrate or sizable effusion is seen. No bony abnormality is  noted. IMPRESSION: No active disease. Electronically Signed   By: Inez Catalina M.D.   On: 10/15/2020 22:15   DG Pelvis 1-2 Views  Result Date: 10/15/2020 CLINICAL DATA:  Syncope EXAM: PELVIS - 1-2 VIEW COMPARISON:  None. FINDINGS:  Mild symmetric degenerative changes within the hips with joint space narrowing and spurring. SI joints symmetric and unremarkable. No acute bony abnormality. Specifically, no fracture, subluxation, or dislocation. Vascular calcifications. IMPRESSION: Mild degenerative changes in the hips.  No acute bony abnormality. Electronically Signed   By: Rolm Baptise M.D.   On: 10/15/2020 20:46   CT ABDOMEN PELVIS W CONTRAST  Result Date: 10/16/2020 CLINICAL DATA:  Cancer of unknown primary. Staging for malignancy in a patient with weakness, poor appetite, and urinary retention. EXAM: CT ABDOMEN AND PELVIS WITH CONTRAST TECHNIQUE: Multidetector CT imaging of the abdomen and pelvis was performed using the standard protocol following bolus administration of intravenous contrast. CONTRAST:  166mL OMNIPAQUE IOHEXOL 300 MG/ML  SOLN COMPARISON:  CT right hip 10/15/2020 FINDINGS: Lower chest: Atelectasis in the lung bases. Cardiac enlargement. Coronary artery calcifications. Hepatobiliary: No focal liver abnormality is seen. No gallstones, gallbladder wall thickening, or biliary dilatation. Pancreas: Unremarkable. No pancreatic ductal dilatation or surrounding inflammatory changes. Spleen: Normal in size without focal abnormality. Adrenals/Urinary Tract: Adrenal glands are unremarkable. Kidneys are normal, without renal calculi, focal lesion, or hydronephrosis. Bladder is unremarkable. Stomach/Bowel: Stomach, small bowel, and colon are not abnormally distended. No wall thickening or inflammatory infiltration identified. Appendix is normal. Vascular/Lymphatic: Diffuse calcification of the aorta. Small aortic aneurysm measuring 3 cm AP dimension. No significant lymphadenopathy. Reproductive:  Prostate is unremarkable. Other: No free air or free fluid in the abdomen. Abdominal wall musculature appears intact. Subcutaneous soft tissue gas in the anterior abdomen likely represents injection site. Musculoskeletal: Degenerative changes in the lumbar spine and hips. Degenerative cyst in the right acetabulum. Degenerative cyst versus bone cyst in the right proximal femur. No destructive bone lesions identified. IMPRESSION: 1. No evidence of primary or metastatic disease in the abdomen or pelvis. 2. Cardiac enlargement with coronary artery calcifications. 3. Small aortic aneurysm measuring 3 cm AP dimension. 4. Degenerative changes in the lumbar spine and hips. 5. Degenerative cyst versus bone cyst in the right proximal femur. Aortic Atherosclerosis (ICD10-I70.0). Electronically Signed   By: Lucienne Capers M.D.   On: 10/16/2020 01:41   CT HIP RIGHT WO CONTRAST  Result Date: 10/16/2020 CLINICAL DATA:  Syncope. Patient found on floor. Evaluate for hip fracture. EXAM: CT OF THE RIGHT HIP WITHOUT CONTRAST TECHNIQUE: Multidetector CT imaging of the right hip was performed according to the standard protocol. Multiplanar CT image reconstructions were also generated. COMPARISON:  Right hip radiographs 10/14/2020 FINDINGS: Bones/Joint/Cartilage No evidence of acute fracture or dislocation of the right hip. Degenerative changes are present in the right hip. Subcortical cyst in the posterior acetabulum is likely degenerative. Prominent cyst with surrounding sclerosis demonstrated in the anterior aspect of the right femoral head may represent a degenerative cyst or a bone cyst. No expansile change, periosteal reaction, or bone destruction to suggest an aggressive lesion. No significant joint effusion. Ligaments Suboptimally assessed by CT. Muscles and Tendons Right hip musculature appears intact. No intramuscular mass, collection, or hematoma. Soft tissues Vascular calcifications in the external iliac and common  femoral arteries. No soft tissue hematoma or collection. IMPRESSION: 1. No evidence of acute fracture or dislocation of the right hip. 2. Degenerative changes in the right hip. 3. Prominent cyst with surrounding sclerosis in the anterior aspect of the right femoral head may represent a degenerative cyst or a bone cyst. Electronically Signed   By: Lucienne Capers M.D.   On: 10/16/2020 00:03   CT HEAD CODE STROKE WO CONTRAST  Result Date: 10/15/2020 CLINICAL DATA:  Code stroke. Initial evaluation for acute left-sided weakness. EXAM: CT HEAD WITHOUT CONTRAST TECHNIQUE: Contiguous axial images were obtained from the base of the skull through the vertex without intravenous contrast. COMPARISON:  None available. FINDINGS: Brain: Generalized age-related cerebral atrophy with mild-to-moderate chronic microvascular ischemic disease. No acute intracranial hemorrhage. No acute large vessel territory infarct. No mass lesion, midline shift or mass effect. No hydrocephalus or extra-axial fluid collection. Vascular: No hyperdense vessel. Calcified atherosclerosis present at skull base. Skull: Scalp soft tissues and calvarium within normal limits. Sinuses/Orbits: Globes and orbital soft tissues demonstrate no acute finding. Scattered mucosal thickening noted within the ethmoidal air cells and maxillary sinuses. Mastoid air cells are clear. Other: None. ASPECTS [facility] Stroke Program Early CT Score) - Ganglionic level infarction (caudate, lentiform nuclei, internal capsule, insula, M1-M3 cortex): 7 - Supraganglionic infarction (M4-M6 cortex): 3 Total score (0-10 with 10 being normal): 10 IMPRESSION: 1. No acute intracranial infarct or other abnormality. 2. ASPECTS is 10. 3. Age-related cerebral atrophy with mild-to-moderate chronic microvascular ischemic disease. These results were communicated to Dr. Curly Shores at 7:28 pmon 10/20/2021by text page via the [facility] messaging system. Electronically Signed   By: Jeannine Boga  M.D.   On: 10/15/2020 19:30   VAS [place] LOWER EXTREMITY VENOUS (DVT)  Result Date: 10/16/2020  Lower Venous DVTStudy Indications: Pain, and Swelling.  Limitations: Poor ultrasound/tissue interface. Comparison Study: No prior studies. Performing Technologist: Darlin Coco  Examination Guidelines: A complete evaluation includes B-mode imaging, spectral Doppler, color Doppler, and power Doppler as needed of all accessible portions of each vessel. Bilateral testing is considered an integral part of a complete examination. Limited examinations for reoccurring indications may be performed as noted. The reflux portion of the exam is performed with the patient in reverse Trendelenburg.  +---------+---------------+---------+-----------+----------+--------------+ RIGHT    CompressibilityPhasicitySpontaneityPropertiesThrombus Aging +---------+---------------+---------+-----------+----------+--------------+ CFV      Full           Yes      Yes                                 +---------+---------------+---------+-----------+----------+--------------+ SFJ      Full                                                        +---------+---------------+---------+-----------+----------+--------------+ FV Prox  Full                                                        +---------+---------------+---------+-----------+----------+--------------+ FV Mid   Full                                                        +---------+---------------+---------+-----------+----------+--------------+ FV DistalFull                                                        +---------+---------------+---------+-----------+----------+--------------+  PFV      Full                                                        +---------+---------------+---------+-----------+----------+--------------+ POP      Full           Yes      Yes                                  +---------+---------------+---------+-----------+----------+--------------+ PTV      Full                                                        +---------+---------------+---------+-----------+----------+--------------+ PERO                                                  Not visualized +---------+---------------+---------+-----------+----------+--------------+   +----+---------------+---------+-----------+----------+--------------+ LEFTCompressibilityPhasicitySpontaneityPropertiesThrombus Aging +----+---------------+---------+-----------+----------+--------------+ CFV Full           Yes      Yes                                 +----+---------------+---------+-----------+----------+--------------+     Summary: RIGHT: - There is no evidence of deep vein thrombosis in the lower extremity.  - A complex cystic structure is found in the popliteal fossa.  LEFT: - No evidence of common femoral vein obstruction.  *See table(s) above for measurements and observations. Electronically signed by Servando Snare MD on 10/16/2020 at 4:11:42 PM.    Final     Scheduled Meds: . atorvastatin  10 mg Oral Daily  . enoxaparin (LOVENOX) injection  40 mg Subcutaneous Q24H  . folic acid  1 mg Oral Daily  . insulin aspart  0-9 Units Subcutaneous TID AC & HS  . irbesartan  300 mg Oral Daily  . metoprolol succinate  50 mg Oral Daily  . multivitamin with minerals  1 tablet Oral Daily  . pantoprazole  40 mg Oral BID  . potassium chloride  40 mEq Oral Once  . thiamine  100 mg Oral Daily   Or  . thiamine  100 mg Intravenous Daily   Continuous Infusions: . lactated ringers 100 mL/hr at 10/16/20 1419  . magnesium sulfate bolus IVPB       LOS: 0 days    Time spent: 35 mins.    Shawna Clamp, MD [facility]   If 7PM-7AM, please contact night-coverage

## 2020-10-16 NOTE — ED Notes (Signed)
Report given to inpatient RN.

## 2020-10-16 NOTE — Plan of Care (Signed)

## 2020-10-16 NOTE — Progress Notes (Signed)
Lower extremity venous RT study completed.   Please see CV Proc for preliminary results.   Lynford Espinoza, RDMS  

## 2020-10-16 NOTE — ED Notes (Signed)
Pt to CT at this time.

## 2020-10-17 ENCOUNTER — Inpatient Hospital Stay (HOSPITAL_COMMUNITY): Payer: Medicare Other

## 2020-10-17 DIAGNOSIS — R569 Unspecified convulsions: Secondary | ICD-10-CM | POA: Diagnosis not present

## 2020-10-17 DIAGNOSIS — E876 Hypokalemia: Secondary | ICD-10-CM | POA: Diagnosis not present

## 2020-10-17 LAB — PHOSPHORUS: Phosphorus: 2.7 mg/dL (ref 2.5–4.6)

## 2020-10-17 LAB — URINE CULTURE: Culture: NO GROWTH

## 2020-10-17 LAB — COMPREHENSIVE METABOLIC PANEL
ALT: 47 U/L — ABNORMAL HIGH (ref 0–44)
AST: 98 U/L — ABNORMAL HIGH (ref 15–41)
Albumin: 3.1 g/dL — ABNORMAL LOW (ref 3.5–5.0)
Alkaline Phosphatase: 43 U/L (ref 38–126)
Anion gap: 13 (ref 5–15)
BUN: 7 mg/dL — ABNORMAL LOW (ref 8–23)
CO2: 22 mmol/L (ref 22–32)
Calcium: 6.9 mg/dL — ABNORMAL LOW (ref 8.9–10.3)
Chloride: 103 mmol/L (ref 98–111)
Creatinine, Ser: 0.84 mg/dL (ref 0.61–1.24)
GFR, Estimated: >60 mL/min (ref >60)
Glucose, Bld: 140 mg/dL — ABNORMAL HIGH (ref 70–99)
Potassium: 3.4 mmol/L — ABNORMAL LOW (ref 3.5–5.1)
Sodium: 138 mmol/L (ref 135–145)
Total Bilirubin: 1.1 mg/dL (ref 0.3–1.2)
Total Protein: 6.1 g/dL — ABNORMAL LOW (ref 6.5–8.1)

## 2020-10-17 LAB — GLUCOSE, CAPILLARY
Glucose-Capillary: 117 mg/dL — ABNORMAL HIGH (ref 70–99)
Glucose-Capillary: 118 mg/dL — ABNORMAL HIGH (ref 70–99)
Glucose-Capillary: 144 mg/dL — ABNORMAL HIGH (ref 70–99)

## 2020-10-17 LAB — HEMOGLOBIN A1C
Hgb A1c MFr Bld: 5.7 % — ABNORMAL HIGH (ref 4.8–5.6)
Mean Plasma Glucose: 117 mg/dL

## 2020-10-17 LAB — AMMONIA: Ammonia: 39 umol/L — ABNORMAL HIGH (ref 9–35)

## 2020-10-17 LAB — CALCIUM, IONIZED: Calcium, Ionized, Serum: 3.9 mg/dL — ABNORMAL LOW (ref 4.5–5.6)

## 2020-10-17 LAB — MAGNESIUM: Magnesium: 1.5 mg/dL — ABNORMAL LOW (ref 1.7–2.4)

## 2020-10-17 IMAGING — MR MR MRA HEAD W/O CM
7 of 10 series · 33 of 48 positions shown · non-contrast
Comparison: None.

CLINICAL DATA: Altered mental status, seizure

EXAM:
MRI HEAD WITHOUT CONTRAST
MRA HEAD WITHOUT CONTRAST
TECHNIQUE: Multiplanar, multiecho pulse sequences of the brain and surrounding
structures were obtained without intravenous contrast. Angiographic
images of the head were obtained using MRA technique without
contrast.

[Series 4: DWI · axial · 3.0mm · 1.09mm/px · z∈[-64,+97]mm · 8 of 110 slices shown (1 of 4)]
[im 1/110]
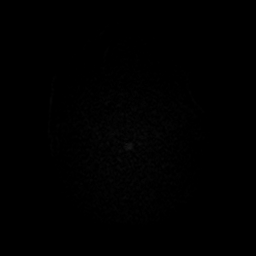
[im 16/110]
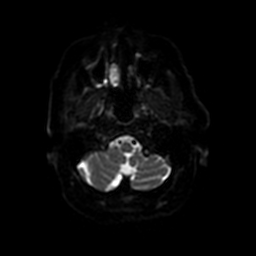
[im 32/110]
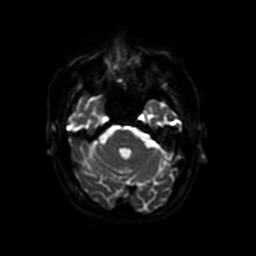
[im 47/110]
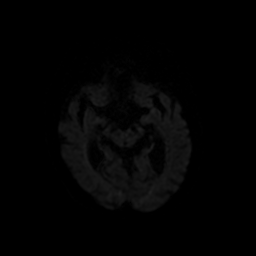
[im 63/110]
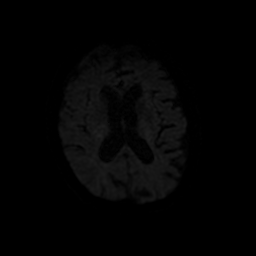
[im 78/110]
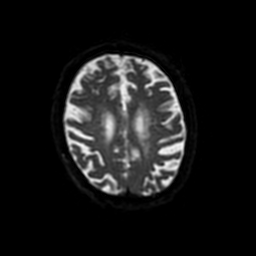
[im 94/110]
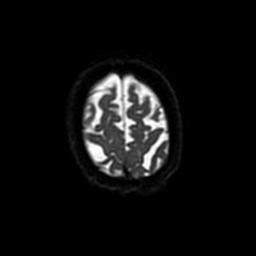
[im 110/110]
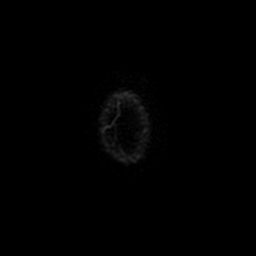

[Series 5: DWI · coronal · 5.0mm · 1.09mm/px · 6 of 86 slices shown (2 of 4)]
[im 1/86]
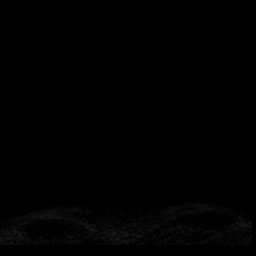
[im 18/86]
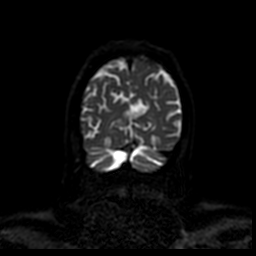
[im 35/86]
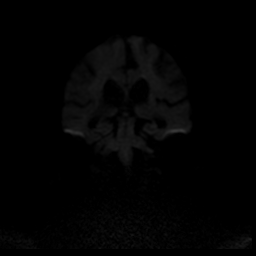
[im 52/86]
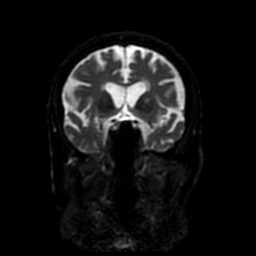
[im 69/86]
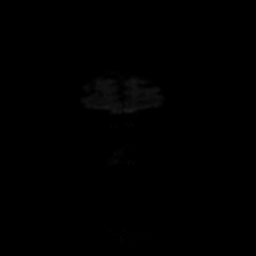
[im 86/86]
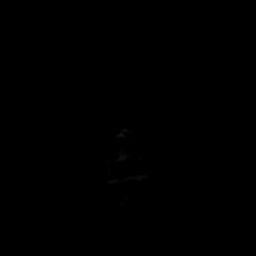

[Series 7: T2 · axial · 5.0mm · 0.43mm/px · z∈[-57,+98]mm · 2 of 27 slices shown]
[im 1/27]
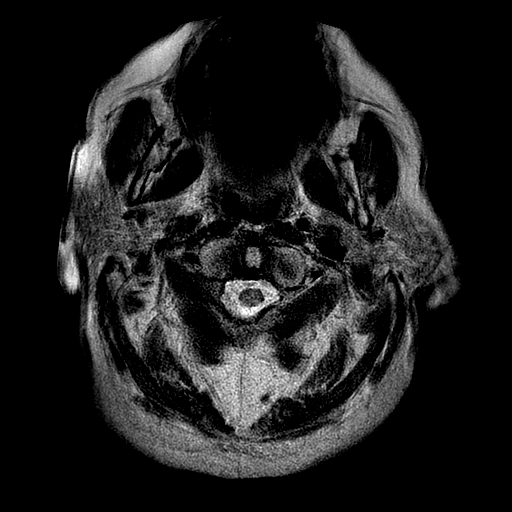
[im 27/27]
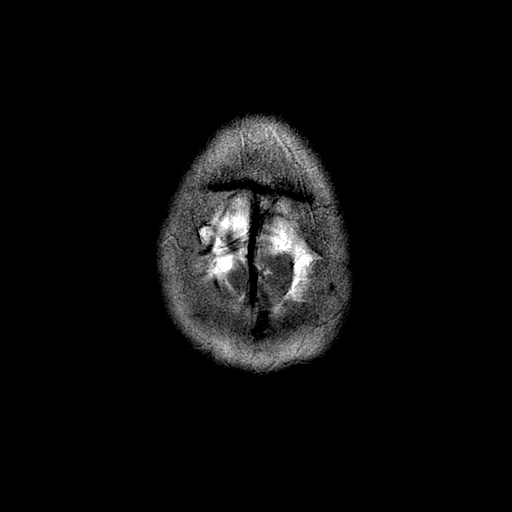

[Series 10: T1 · axial · 3.0mm · 0.43mm/px · z∈[-58,+101]mm · 8 of 108 slices shown]
[im 1/108]
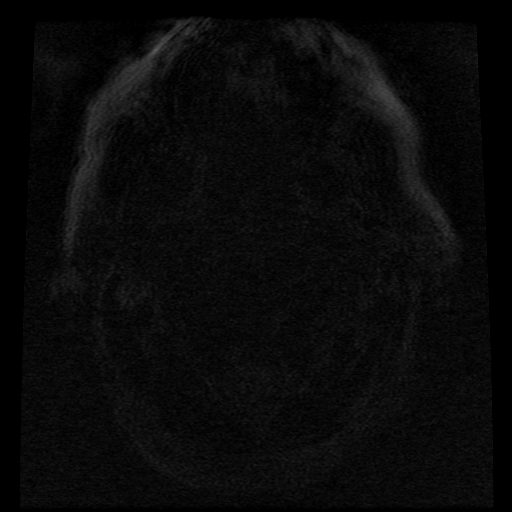
[im 16/108]
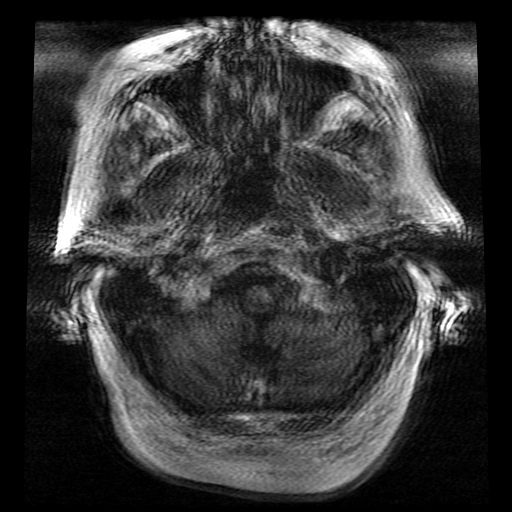
[im 31/108]
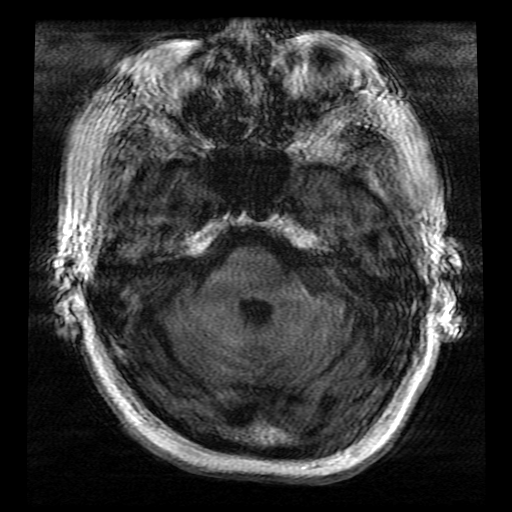
[im 46/108]
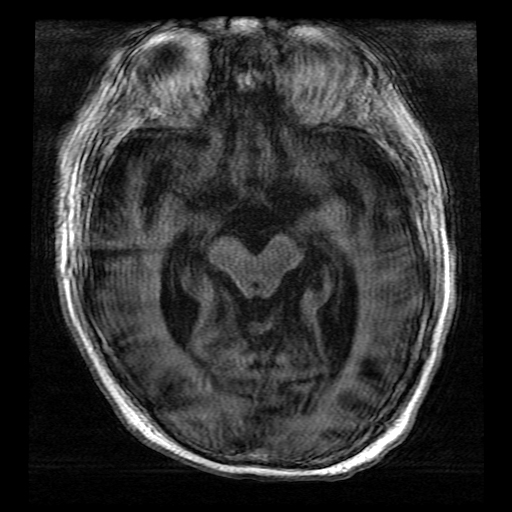
[im 62/108]
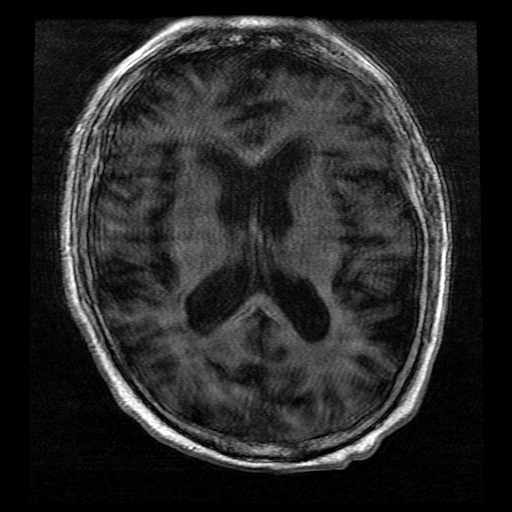
[im 77/108]
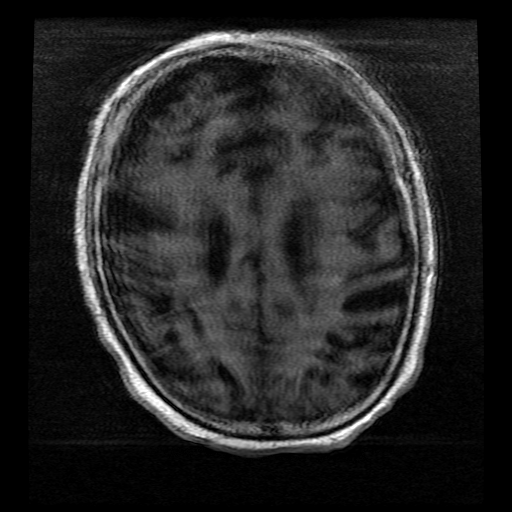
[im 92/108]
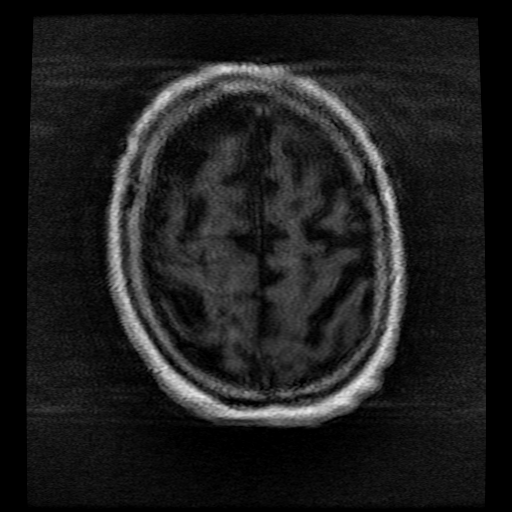
[im 108/108]
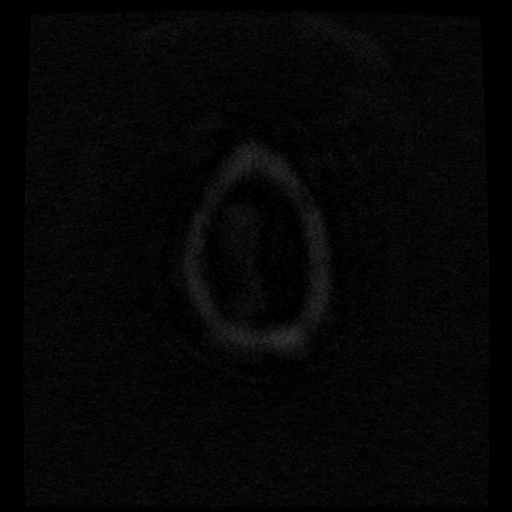

[Series 12: FLAIR · axial · 5.0mm · 0.43mm/px · z∈[-57,+98]mm · 2 of 27 slices shown]
[im 1/27]
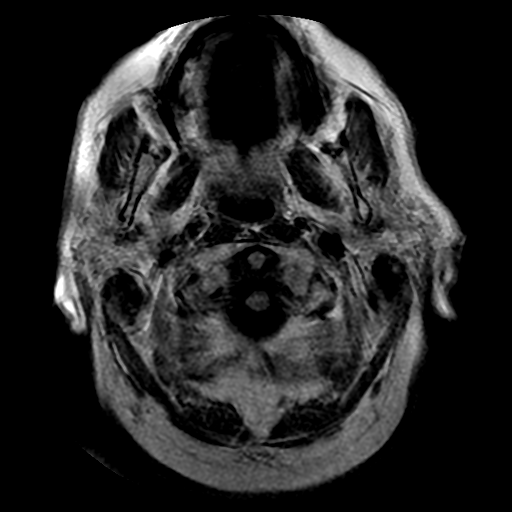
[im 27/27]
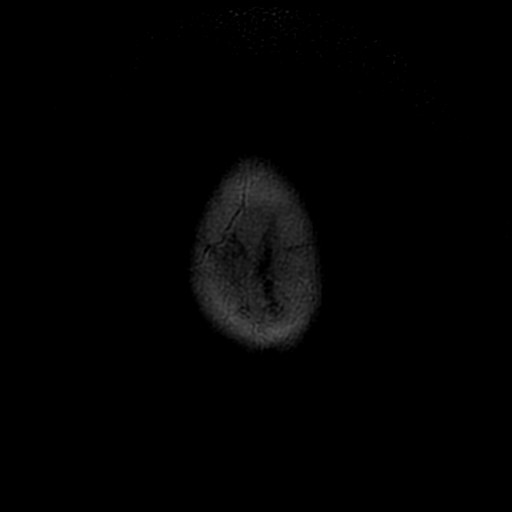

[Series 400: DWI · axial · 3.0mm · 1.09mm/px · z∈[-64,+97]mm · 4 of 55 slices shown (3 of 4)]
[im 1/55]
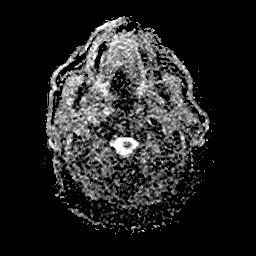
[im 19/55]
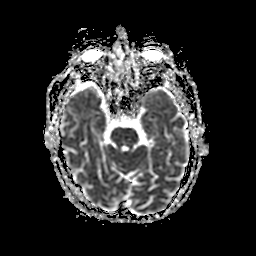
[im 37/55]
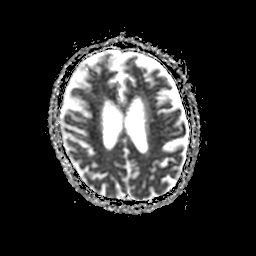
[im 55/55]
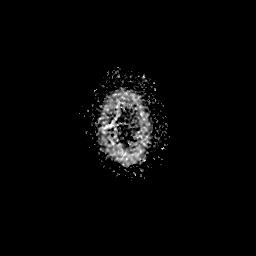

[Series 500: DWI · coronal · 5.0mm · 1.09mm/px · 3 of 43 slices shown (4 of 4)]
[im 1/43]
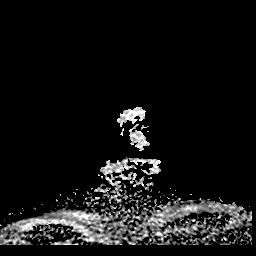
[im 22/43]
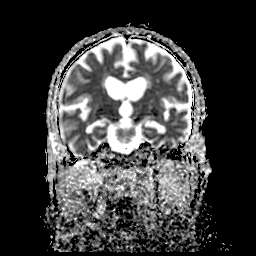
[im 43/43]
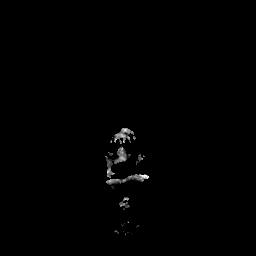

[33 of 48 positions shown; findings below may reference images not displayed]

FINDINGS: Motion artifact is present. Below findings are within this
limitation.

MRI HEAD

Brain: There is no acute infarction or intracranial hemorrhage.
There is no intracranial mass, mass effect, or edema. There is no
hydrocephalus or extra-axial fluid collection. Prominence of the
ventricles and sulci reflects generalized parenchymal volume loss.
Patchy T2 hyperintensity in the supratentorial white matter is
nonspecific but may reflect mild chronic microvascular ischemic
changes.

Vascular: Major vessel flow voids at the skull base are preserved.

Skull and upper cervical spine: Normal marrow signal is within
normal limits.

Sinuses/Orbits: Mild mucosal thick. Orbits are grossly unremarkable.

Other: Sella is unremarkable.  Mastoid air cells are clear.

MRA HEAD

Intracranial internal carotid arteries are patent. Middle and
anterior cerebral arteries are patent. Intracranial vertebral
arteries, basilar artery, posterior cerebral arteries are patent.
There is no significant stenosis or aneurysm.
IMPRESSION: Motion degraded study.

No evidence of acute infarction, hemorrhage, or mass. Mild chronic
microvascular ischemic changes.

No proximal intracranial vessel occlusion or significant stenosis.

## 2020-10-17 IMAGING — MR MR HEAD W/O CM
7 of 10 series · 33 of 48 positions shown · non-contrast
Comparison: None.

CLINICAL DATA: Altered mental status, seizure

EXAM:
MRI HEAD WITHOUT CONTRAST
MRA HEAD WITHOUT CONTRAST
TECHNIQUE: Multiplanar, multiecho pulse sequences of the brain and surrounding
structures were obtained without intravenous contrast. Angiographic
images of the head were obtained using MRA technique without
contrast.

[Series 4: DWI · axial · 3.0mm · 1.09mm/px · z∈[-64,+97]mm · 8 of 110 slices shown (1 of 4)]
[im 1/110]
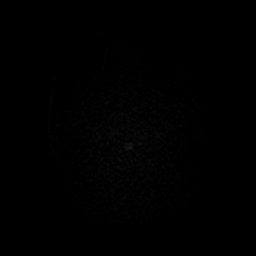
[im 16/110]
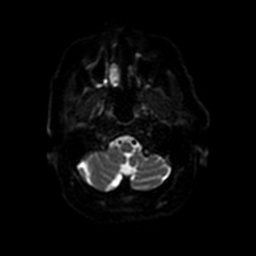
[im 32/110]
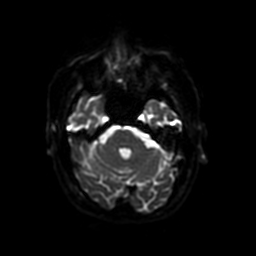
[im 47/110]
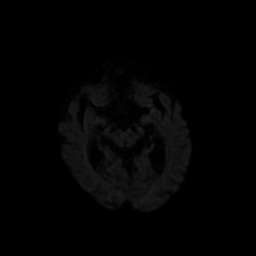
[im 63/110]
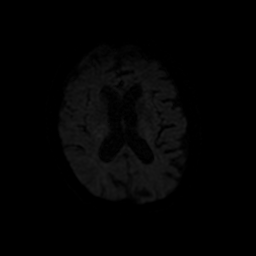
[im 78/110]
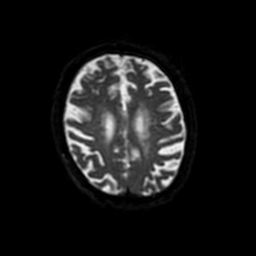
[im 94/110]
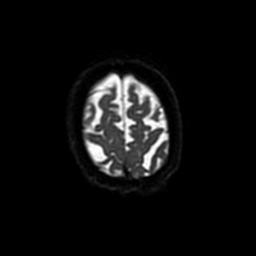
[im 110/110]
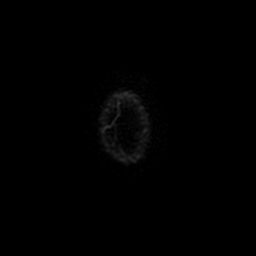

[Series 5: DWI · coronal · 5.0mm · 1.09mm/px · 6 of 86 slices shown (2 of 4)]
[im 1/86]
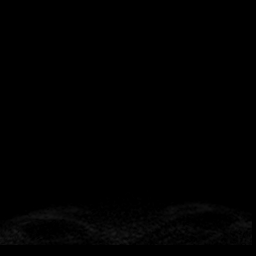
[im 18/86]
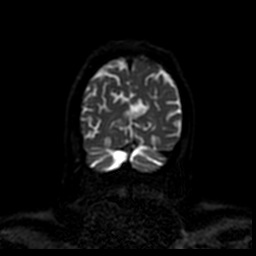
[im 35/86]
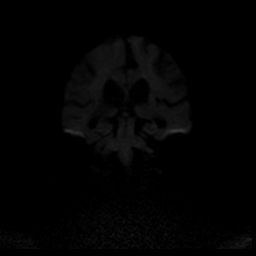
[im 52/86]
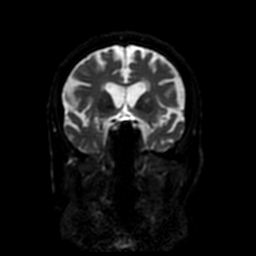
[im 69/86]
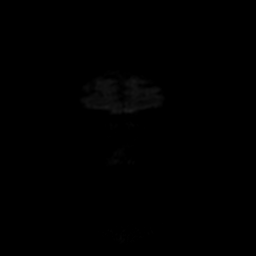
[im 86/86]
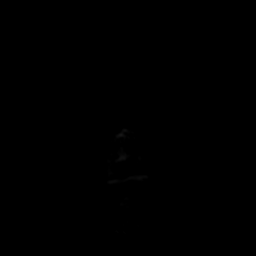

[Series 7: T2 · axial · 5.0mm · 0.43mm/px · z∈[-57,+98]mm · 2 of 27 slices shown]
[im 1/27]
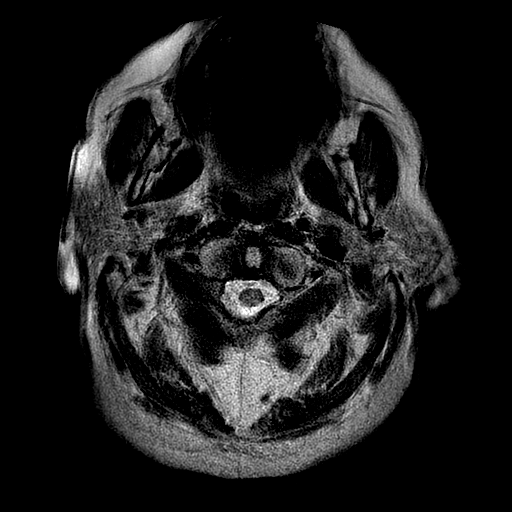
[im 27/27]
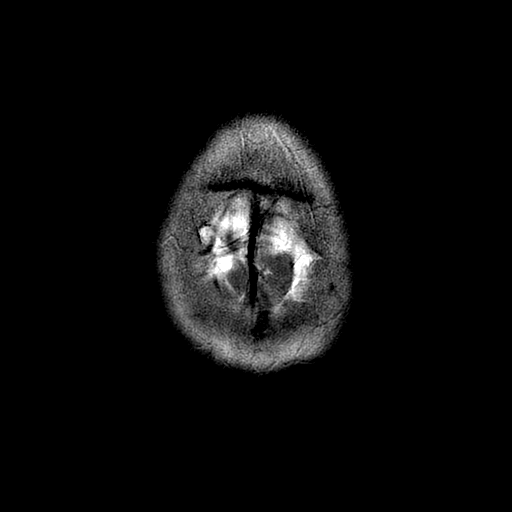

[Series 10: T1 · axial · 3.0mm · 0.43mm/px · z∈[-58,+101]mm · 8 of 108 slices shown]
[im 1/108]
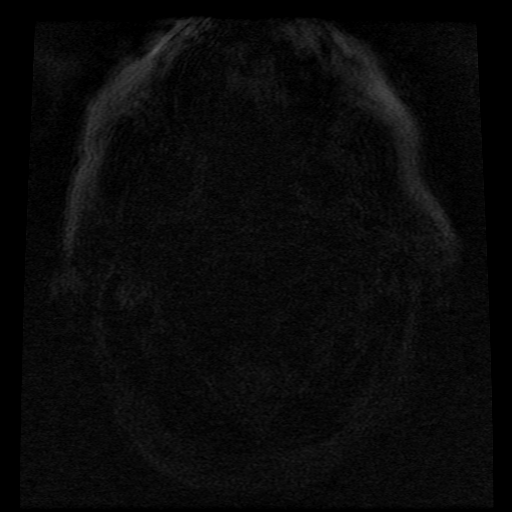
[im 16/108]
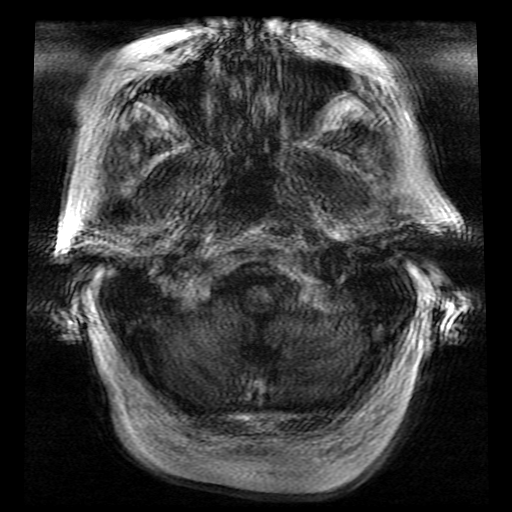
[im 31/108]
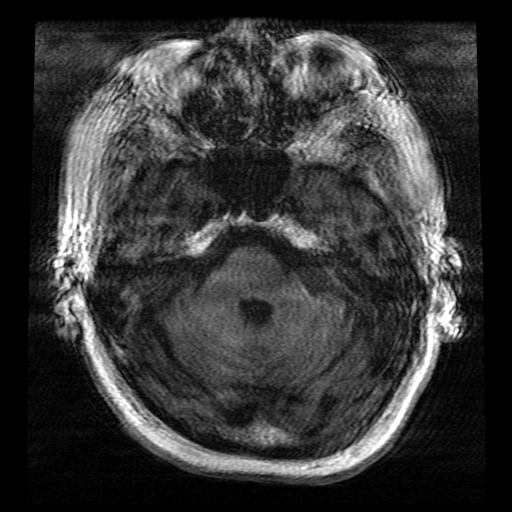
[im 46/108]
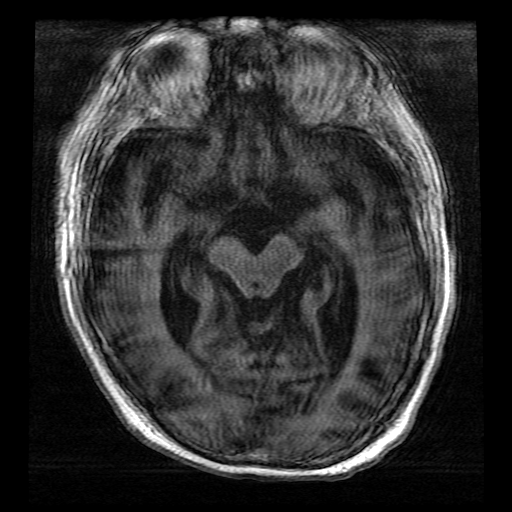
[im 62/108]
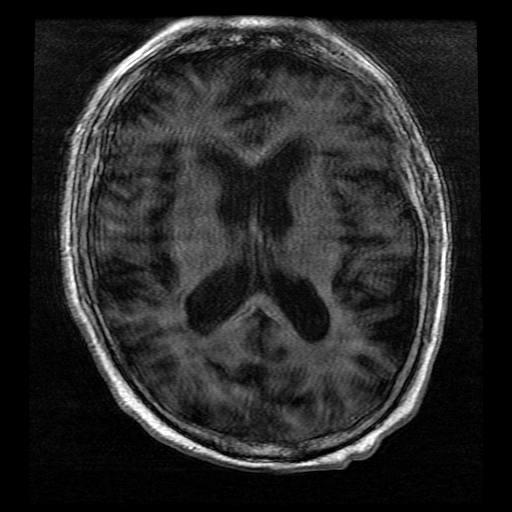
[im 77/108]
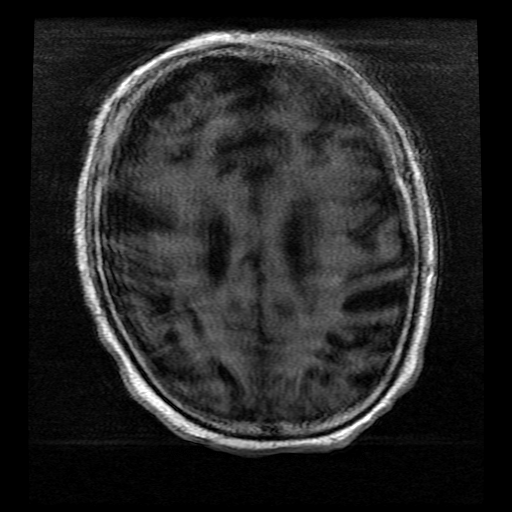
[im 92/108]
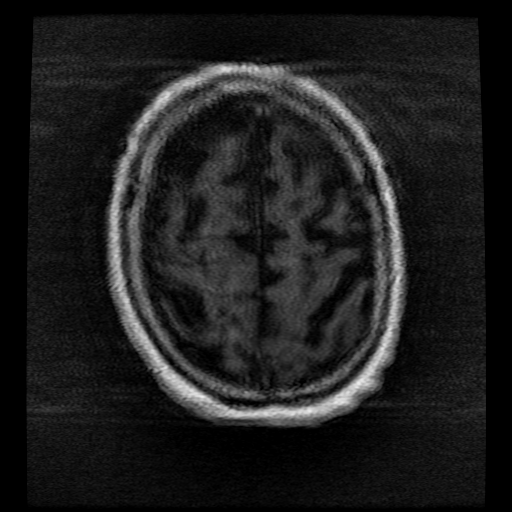
[im 108/108]
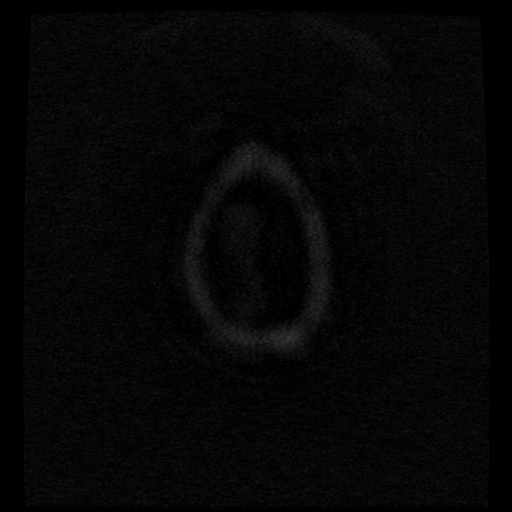

[Series 12: FLAIR · axial · 5.0mm · 0.43mm/px · z∈[-57,+98]mm · 2 of 27 slices shown]
[im 1/27]
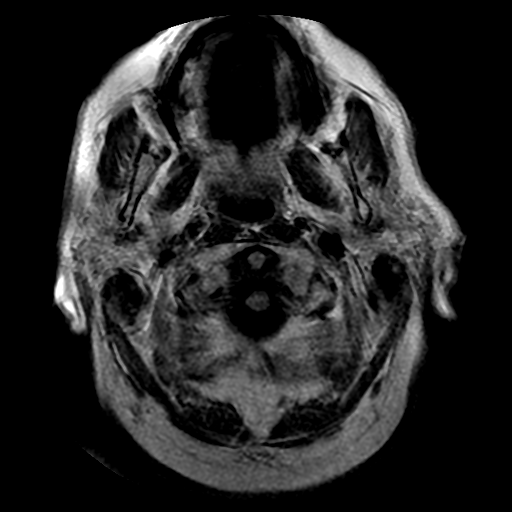
[im 27/27]
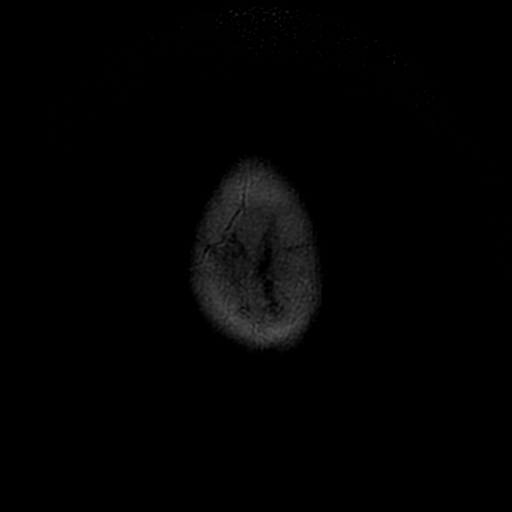

[Series 400: DWI · axial · 3.0mm · 1.09mm/px · z∈[-64,+97]mm · 4 of 55 slices shown (3 of 4)]
[im 1/55]
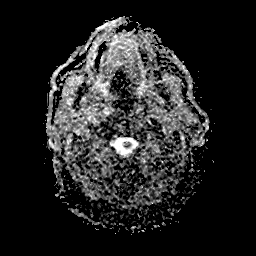
[im 19/55]
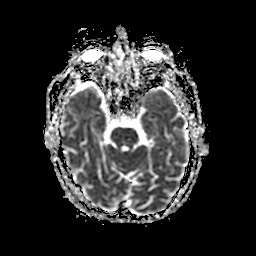
[im 37/55]
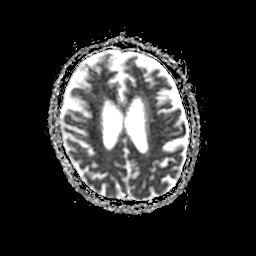
[im 55/55]
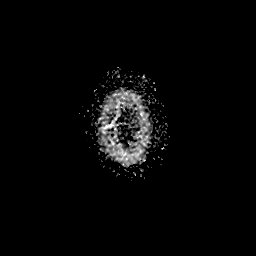

[Series 500: DWI · coronal · 5.0mm · 1.09mm/px · 3 of 43 slices shown (4 of 4)]
[im 1/43]
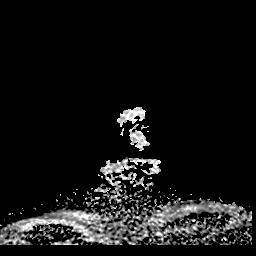
[im 22/43]
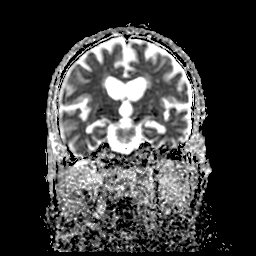
[im 43/43]
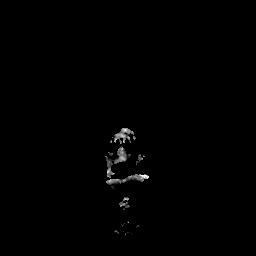

[33 of 48 positions shown; findings below may reference images not displayed]

FINDINGS: Motion artifact is present. Below findings are within this
limitation.

MRI HEAD

Brain: There is no acute infarction or intracranial hemorrhage.
There is no intracranial mass, mass effect, or edema. There is no
hydrocephalus or extra-axial fluid collection. Prominence of the
ventricles and sulci reflects generalized parenchymal volume loss.
Patchy T2 hyperintensity in the supratentorial white matter is
nonspecific but may reflect mild chronic microvascular ischemic
changes.

Vascular: Major vessel flow voids at the skull base are preserved.

Skull and upper cervical spine: Normal marrow signal is within
normal limits.

Sinuses/Orbits: Mild mucosal thick. Orbits are grossly unremarkable.

Other: Sella is unremarkable.  Mastoid air cells are clear.

MRA HEAD

Intracranial internal carotid arteries are patent. Middle and
anterior cerebral arteries are patent. Intracranial vertebral
arteries, basilar artery, posterior cerebral arteries are patent.
There is no significant stenosis or aneurysm.
IMPRESSION: Motion degraded study.

No evidence of acute infarction, hemorrhage, or mass. Mild chronic
microvascular ischemic changes.

No proximal intracranial vessel occlusion or significant stenosis.

## 2020-10-17 MED ORDER — CALCIUM GLUCONATE-NACL 1-0.675 GM/50ML-% IV SOLN
1.0000 g | Freq: Once | INTRAVENOUS | Status: AC
  Administered 2020-10-17: 1000 mg via INTRAVENOUS
  Filled 2020-10-17: qty 50

## 2020-10-17 MED ORDER — POTASSIUM CHLORIDE CRYS ER 20 MEQ PO TBCR
40.0000 meq | EXTENDED_RELEASE_TABLET | Freq: Once | ORAL | Status: AC
  Administered 2020-10-17: 40 meq via ORAL

## 2020-10-17 MED ORDER — CALCIUM GLUCONATE-NACL 2-0.675 GM/100ML-% IV SOLN
2.0000 g | Freq: Once | INTRAVENOUS | Status: DC
  Filled 2020-10-17: qty 100

## 2020-10-17 MED ORDER — MAGNESIUM SULFATE 4 GM/100ML IV SOLN
4.0000 g | Freq: Once | INTRAVENOUS | Status: AC
  Administered 2020-10-17: 4 g via INTRAVENOUS
  Filled 2020-10-17: qty 100

## 2020-10-17 MED ORDER — CALCIUM CARBONATE ANTACID 500 MG PO CHEW
200.0000 mg | CHEWABLE_TABLET | Freq: Two times a day (BID) | ORAL | Status: AC
  Administered 2020-10-18 – 2020-10-19 (×3): 200 mg via ORAL
  Filled 2020-10-17 (×5): qty 1

## 2020-10-17 NOTE — Progress Notes (Signed)
Observed sitting on his bottom in floor by bedside between side rails.  Alarm sounding.  Assessment completed, no injuries noted, denies pain.  Staff assisted to side of bed, offered toileting and snack, refused at this time.  Back to bed and repositioned for comfort.  Provider notified.  Attempted call to son with no answer and no opportunity to leave message.  Sister notified.

## 2020-10-17 NOTE — Progress Notes (Signed)
Sitting on side of low bed between rails.  Offered toileting and snack, refused.  Repositioned and back to bed.  PRN Ativan administered for agitation and nervousness.

## 2020-10-17 NOTE — Progress Notes (Signed)
Patient up to [facility] with assist in room.  Cooperative and communicating appropriately.  Impulsive with unsteady gait.  Self removed IV access.  Will follow.

## 2020-10-17 NOTE — Plan of Care (Signed)
  Problem: Education: Goal: Knowledge of condition and prescribed therapy will improve Outcome: Progressing   

## 2020-10-17 NOTE — Progress Notes (Signed)
Received call from independent living facility where patient lives regarding his condition and plan of care.  Reviewed current status with caller, Anderson Malta.  She stated that at baseline, patient is typically alert and oriented x4 and drives a car daily.  He continues to be confused in the room while lying in bed and sitter present.

## 2020-10-17 NOTE — Progress Notes (Signed)
Sitting in chair in room, repeatedly sliding down in chair and repositioned with assistance.  Administered PRN medication for agitation

## 2020-10-17 NOTE — Progress Notes (Signed)
MRI ordered on 10/15/20. Mri has tried multiple times to get pt for exam. Per RN, pt has been and continues to be agitated, confused and combative. As of 10/17/20 pt is still in this state. Unable to scan pt in this condition at this time.

## 2020-10-17 NOTE — Progress Notes (Addendum)
Neurology Progress Note   S:// Seen and examined.  Remains very agitated overnight and could not tolerate imaging.   O:// Current vital signs: BP (!) 140/51 (BP Location: Left Leg)   Pulse 79   Temp 98.3 F (36.8 C) (Oral)   Resp 20   Ht 5\' 10"  (1.778 m)   Wt 78.9 kg   SpO2 98%   BMI 24.95 kg/m  Vital signs in last 24 hours: Temp:  [98.1 F (36.7 C)-98.3 F (36.8 C)] 98.3 F (36.8 C) (10/22 4403) Pulse Rate:  [70-89] 79 (10/22 0812) Resp:  [15-26] 20 (10/22 0812) BP: (124-154)/(51-119) 140/51 (10/22 0812) SpO2:  [79 %-100 %] 98 % (10/22 0812) Weight:  [78.9 kg] 78.9 kg (10/21 1602) General: Looks uncomfortable and is sitting in bed with his legs hanging at the foot and. HEENT: Normocephalic atraumatic Lungs clear Cardiovascular: Regular rate rhythm Extremities: Bruising on the right arm Neurological exam He is awake alert oriented to self. When I asked him where he is, he said in [place]. He did not know he was in the hospital.  When I told him he is in the hospital he was surprised. He could not name the current president but called him the gentleman from [place].  He could not name the previous president-that it was [employer]. Speech is not dysarthric.  He follows commands.  He is able to name objects, repeat and comprehend but he has extremely poor attention and concentration. Cranial nerves: Pupils equal round react light, extraocular movements intact, visual fields full, face symmetric, facial sensation intact, tongue and palate midline. On motor examination: Right upper extremity antigravity without drift.  Left upper extremity antigravity without drift.  Right lower extremity examination is marred by pain and he is able to flex his hip at least 3-4/5 but not able to extend his knee any more than barely antigravity.  Left lower extremity is antigravity without drift. Mild asterixis on outstretched arms. Sensory exam: Intact to touch all over Coordination:  Difficult to assess but no obvious dysmetria.  Medications  Current Facility-Administered Medications:  .  acetaminophen (TYLENOL) tablet 650 mg, 650 mg, Oral, Q6H PRN, 650 mg at 10/16/20 0350 **OR** acetaminophen (TYLENOL) suppository 650 mg, 650 mg, Rectal, Q6H PRN, Shalhoub, Sherryll Burger, MD .  atorvastatin (LIPITOR) tablet 10 mg, 10 mg, Oral, Daily, Shalhoub, Sherryll Burger, MD, 10 mg at 10/16/20 1107 .  barrier cream (non-specified) 1 application, 1 application, Topical, BID PRN, Shawna Clamp, MD .  enoxaparin (LOVENOX) injection 40 mg, 40 mg, Subcutaneous, Q24H, Shalhoub, Sherryll Burger, MD, 40 mg at 10/16/20 0031 .  folic acid (FOLVITE) tablet 1 mg, 1 mg, Oral, Daily, Shalhoub, Sherryll Burger, MD, 1 mg at 10/16/20 1100 .  haloperidol lactate (HALDOL) injection 2 mg, 2 mg, Intravenous, Q6H PRN, Opyd, Ilene Qua, MD, 2 mg at 10/16/20 2204 .  insulin aspart (novoLOG) injection 0-9 Units, 0-9 Units, Subcutaneous, TID AC & HS, Shalhoub, Sherryll Burger, MD .  irbesartan (AVAPRO) tablet 300 mg, 300 mg, Oral, Daily, Shalhoub, Sherryll Burger, MD, 300 mg at 10/16/20 1107 .  LORazepam (ATIVAN) tablet 1-4 mg, 1-4 mg, Oral, Q1H PRN, 1 mg at 10/16/20 1948 **OR** LORazepam (ATIVAN) injection 1-4 mg, 1-4 mg, Intravenous, Q1H PRN, Shalhoub, Sherryll Burger, MD, 2 mg at 10/17/20 0342 .  metoprolol succinate (TOPROL-XL) 24 hr tablet 50 mg, 50 mg, Oral, Daily, Shalhoub, Sherryll Burger, MD, 50 mg at 10/16/20 1106 .  multivitamin with minerals tablet 1 tablet, 1 tablet, Oral, Daily,  Vernelle Emerald, MD, 1 tablet at 10/16/20 1100 .  ondansetron (ZOFRAN) tablet 4 mg, 4 mg, Oral, Q6H PRN **OR** ondansetron (ZOFRAN) injection 4 mg, 4 mg, Intravenous, Q6H PRN, Shalhoub, Sherryll Burger, MD .  pantoprazole (PROTONIX) EC tablet 40 mg, 40 mg, Oral, BID, Shalhoub, Sherryll Burger, MD, 40 mg at 10/16/20 1106 .  polyethylene glycol (MIRALAX / GLYCOLAX) packet 17 g, 17 g, Oral, Daily PRN, Shalhoub, Sherryll Burger, MD .  thiamine tablet 100 mg, 100 mg, Oral, Daily, 100 mg at 10/16/20  1100 **OR** thiamine (B-1) injection 100 mg, 100 mg, Intravenous, Daily, Vernelle Emerald, MD  Labs CBC    Component Value Date/Time   WBC 7.4 10/16/2020 2249   RBC 3.45 (L) 10/16/2020 2249   HGB 10.9 (L) 10/16/2020 2249   HCT 31.5 (L) 10/16/2020 2249   PLT 237 10/16/2020 2249   MCV 91.3 10/16/2020 2249   MCH 31.6 10/16/2020 2249   MCHC 34.6 10/16/2020 2249   RDW 12.8 10/16/2020 2249   LYMPHSABS 0.7 10/16/2020 0251   MONOABS 1.0 10/16/2020 0251   EOSABS 0.0 10/16/2020 0251   BASOSABS 0.0 10/16/2020 0251    CMP     Component Value Date/Time   NA 136 10/16/2020 2249   K 3.6 10/16/2020 2249   CL 102 10/16/2020 2249   CO2 21 (L) 10/16/2020 2249   GLUCOSE 144 (H) 10/16/2020 2249   BUN 7 (L) 10/16/2020 2249   CREATININE 0.96 10/16/2020 2249   CREATININE 1.37 (H) 10/25/2016 1124   CALCIUM 7.0 (L) 10/16/2020 2249   PROT 6.1 (L) 10/16/2020 2249   ALBUMIN 3.3 (L) 10/16/2020 2249   AST 106 (H) 10/16/2020 2249   ALT 45 (H) 10/16/2020 2249   ALKPHOS 46 10/16/2020 2249   BILITOT 1.1 10/16/2020 2249   GFRNONAA >60 10/16/2020 2249   GFRAA >60 05/12/2020 1020   EEG within normal limits.  Imaging I have reviewed images in epic and the results pertinent to this consultation are: CT head with no acute changes.  Generalized atrophy.  No acute large vessel territory infarct.  No bleed. An MRI of the brain was recommended but he has not been able to tolerate it due to agitation.  Assessment: 79 year old man, presented as a code stroke because of generalized tonic-clonic seizures.  Found to have severe metabolic and electrolyte derangements including hypomagnesemia, hypokalemia, hypocalcemia.  He is continuing to be confused and had been agitated overnight to the point where he had to be restrained by security. Given his prior history of colon cancer and basal cell cancer along with squamous cell cancers, it would be prudent to look at his brain for any evidence of structural abnormality  such as metastases as well which may be contributing to his current condition.  I would do the study with and without contrast to look for any leptomeningeal enhancement for evidence of carcinomatous meningitis.  Impression: Likely provoked seizures in the setting of multiple metabolic/electrolyte derangements History of cancer-rule out metastases   Recommendations: Supportive medical management per primary team as you are. MRI of the brain with and without contrast when he is able to.  If he remains extremely agitated, can try getting the MRI with some Ativan and worst case he might need it done with assistance with anesthesia. On my examination today he appeared reasonable enough to have the inside to get testing done as he wants to get better. Check ammonia level I will get in touch with the primary team to coordinate.   --  Amie Portland, MD [facility] Pager: [phone] If 7pm to 7am, please call on call as listed on AMION.

## 2020-10-17 NOTE — Progress Notes (Signed)
PROGRESS NOTE    Devin Garza  JGG:[id number] DOB: 1941/10/07 DOA: 10/15/2020 PCP: Isaac Bliss, Rayford Halsted, MD    Brief Narrative: 79 years old male with PMH of hypertension hyperlipidemia osteoarthritis upper GI bleeding in 2017 secondary to gastric and duodenal AVMs, depression, basal cell carcinoma presents in the emergency department after being found down in his home.  Patient is poor historian. Most of history is obtained from EMS and ED staff.  Patient brought to the emergency department as a stroke code and was immediately seen by Neurologist Dr. Curly Shores, CT head was negative for stroke. Patient found to have severe hypomagnesemia,  hypokalemia and hypocalcemia.  Patient had an episode of tonic-clonic activity with evidence of postictal state after an episode.  Patient was given Keppra.  Neurology recommended MRI and EEG.  EEG is negative for any seizures.  MRI is pending given patient remains agitated and restless.   Assessment & Plan:   Principal Problem:   Seizure ([place]) Active Problems:   Mixed hyperlipidemia   Essential hypertension   Prediabetes   GERD without esophagitis   Hypokalemia   Hypomagnesemia   Hyperglycemia   Right hip pain   Acute urinary retention   History of alcohol abuse   Fall   Pressure injury of skin   Seizure [facility])   Patient initially presented to the emergency department after being found down in his place of residence.  Because of patient's fall was initially not clear but with the 1 minute episode of tonic-clonic seizure-like activity with notable postictal state witnessed in the emergency department it is likely that the initial episode that the patient experienced at home was also a seizure.  Etiology of seizures are possibly related to hypovolemia or severe electrolyte disturbance( low ca, Low Mag, Low K)  Per chart review patient has a history of alcohol abuse and is still actively drinking at least 2 glasses of wine daily according to  outpatient notes.  If patient is drinking more heavily an element of alcohol intoxication or withdrawal may be contributing to the seizure activity.  Chest x-ray, unremarkable, UA unremarkable.  EEG no evidence of seizures.  MR imaging of the brain per neurology recommendations is pending  Patient received 1000 mg of Keppra in the ED,  Keppra discontinued as per Neuro   Seizure precautions  Monitoring patient on telemetry  As needed Ativan for repeat seizure activity.  History of alcohol abuse   Patient has endorsed drinking at least 2 to 3 glasses of wine daily   We will place patient on CIWA protocol with as needed benzodiazepines for withdrawal symptoms  Placing patient on thiamine and folate supplementation.  Patient remains agitated and restless, MRI is still pending.  Acute urinary retention   Patient reporting lower abdominal discomfort with inability to urinate in the emergency room  UA shows hemoglobin and ketones otherwise negative for LE and nitrites.  Urine culture pending  Considering vague presentation with malaise, fatigue and seizure, we will additionally obtain CT imaging of the abdomen and pelvis to evaluate for any evidence of insidious malignancy.  CT abdomen pelvis negative for any malignancy.  No history of BPH although considering patient's age this could very well be the cause.    Essential hypertension     Continue home regimen of antihypertensives.    Prediabetes   Known history of prediabetes  Patient is hyperglycemic upon arrival  Ordering hemoglobin A1c  Accu-Cheks before every meal and nightly for now    Hypokalemia  Patient exhibiting multiple electrolyte abnormalities including substantial hypokalemia and severe hypomagnesia  Replacement in progress, recheck labs in the morning.    Hypomagnesemia   Replacement given,  continues to remain hypomagnesemic.    Ordered magnesium sulfate 4 g IV  again  Hypocalcemia   Replacing with intravenous calcium gluconate  Obtain repeat calcium level in the morning along with ionized calcium level    Right hip pain   Patient complaining of a 1 week history of right lower extremity swelling as well as right hip pain.   Hip x-ray performed here in the emergency department reveals no evidence of fracture.  On examination, patient has concurrent right lower extremity edema which the patient states is new.    Patient additionally has right hip pain that is reproducible with isolation of the right hip joint.   .   CT Hip joint No evidence of acute fracture or dislocation of the right hip.   right lower extremity ultrasound ruled out DVT.    GERD without esophagitis   Longstanding history of GERD with known history of gastric and duodenal AVMs  Transitioning patient from home regimen of Nexium to Protonix 40 mg twice daily    Mixed hyperlipidemia  Continue home regimen of statin therapy   DVT prophylaxis: SCDS Code Status: full Family Communication:  No one at bed side. Disposition Plan:   Status is: Inpatient  Remains inpatient appropriate because:Inpatient level of care appropriate due to severity of illness   Dispo: The patient is from: Home              Anticipated d/c is to: SNF              Anticipated d/c date is: 2 days              Patient currently is not medically stable to d/c.   Consultants:    Neurology  Procedures:  Antimicrobials:  Anti-infectives (From admission, onward)   None     Subjective: Patient was seen and examined at bedside.  He appears alert oriented x 2.  Overnight events noted. He was restless and agitated overnight.  MRI of the still pending due to patient's agitation and restlessness.  Objective: Vitals:   10/17/20 0520 10/17/20 0713 10/17/20 0812 10/17/20 1127  BP: (!) 154/62 (!) 147/119 (!) 140/51 (!) 157/92  Pulse: 89 87 79 (!) 102  Resp: 20 (!) 22 20 20   Temp:   98.3 F (36.8 C) 98.3 F (36.8 C) 98.1 F (36.7 C)  TempSrc:  Oral Oral Oral  SpO2: 98% 98% 98% 100%  Weight:      Height:        Intake/Output Summary (Last 24 hours) at 10/17/2020 1341 Last data filed at 10/17/2020 0740 Gross per 24 hour  Intake 1150.18 ml  Output 904 ml  Net 246.18 ml   Filed Weights   10/15/20 1942 10/16/20 1602  Weight: 80 kg 78.9 kg    Examination:  General exam: Appears calm and comfortable.  Agitated and restless. Respiratory system: Clear to auscultation. Respiratory effort normal. Cardiovascular system: S1 & S2 heard, RRR. No JVD, murmurs, rubs, gallops or clicks. No pedal edema. Gastrointestinal system: Abdomen is nondistended, soft and nontender. No organomegaly or masses felt. Normal bowel sounds heard. Central nervous system: Alert and oriented. No focal neurological deficits. Extremities:  No edema, no cyanosis, no clubbing. Skin: No rashes, lesions or ulcers Psychiatry:  Mood & affect appropriate.     Data Reviewed: I have  personally reviewed following labs and imaging studies  CBC: Recent Labs  Lab 10/15/20 1905 10/15/20 1916 10/15/20 2211 10/16/20 0251 10/16/20 2249  WBC 6.8  --   --  7.6 7.4  NEUTROABS 4.5  --   --  5.8  --   HGB 12.1* 12.2* 12.2* 10.1* 10.9*  HCT 36.1* 36.0* 36.0* 29.7* 31.5*  MCV 92.8  --   --  92.0 91.3  PLT 267  --   --  231 355   Basic Metabolic Panel: Recent Labs  Lab 10/15/20 1905 10/15/20 1905 10/15/20 1916 10/15/20 1916 10/15/20 2211 10/16/20 0251 10/16/20 1119 10/16/20 2249 10/17/20 0940  NA 137   < > 137   < > 137 135 138 136 138  K 2.7*   < > 2.6*   < > 2.7* 3.2* 3.1* 3.6 3.4*  CL 99   < > 100  --   --  103 102 102 103  CO2 18*  --   --   --   --  20* 21* 21* 22  GLUCOSE 183*   < > 180*  --   --  131* 110* 144* 140*  BUN 10   < > 9  --   --  9 6* 7* 7*  CREATININE 1.11   < > 1.00  --   --  0.85 0.87 0.96 0.84  CALCIUM 6.7*  --   --   --   --  6.8* 7.1* 7.0* 6.9*  MG  --   --  0.2*   --   --  1.0* 0.8* 1.8 1.5*  PHOS  --   --   --   --   --   --  2.5 2.9 2.7   < > = values in this interval not displayed.   GFR: Estimated Creatinine Clearance: 72.4 mL/min (by C-G formula based on SCr of 0.84 mg/dL). Liver Function Tests: Recent Labs  Lab 10/15/20 1905 10/16/20 0251 10/16/20 1119 10/16/20 2249 10/17/20 0940  AST 48* 58* 97* 106* 98*  ALT 35 32 40 45* 47*  ALKPHOS 45 39 39 46 43  BILITOT 0.8 0.6 1.1 1.1 1.1  PROT 6.8 5.8* 5.7* 6.1* 6.1*  ALBUMIN 3.7 3.1* 3.2* 3.3* 3.1*   No results for input(s): LIPASE, AMYLASE in the last 168 hours. Recent Labs  Lab 10/17/20 0940  AMMONIA 39*   Coagulation Profile: Recent Labs  Lab 10/15/20 1905  INR 1.2   Cardiac Enzymes: No results for input(s): CKTOTAL, CKMB, CKMBINDEX, TROPONINI in the last 168 hours. BNP (last 3 results) No results for input(s): PROBNP in the last 8760 hours. HbA1C: Recent Labs    10/16/20 0251  HGBA1C 5.7*   CBG: Recent Labs  Lab 10/16/20 0810 10/16/20 1322 10/16/20 1618 10/16/20 2132 10/17/20 1155  GLUCAP 100* 111* 122* 149* 117*   Lipid Profile: No results for input(s): CHOL, HDL, LDLCALC, TRIG, CHOLHDL, LDLDIRECT in the last 72 hours. Thyroid Function Tests: No results for input(s): TSH, T4TOTAL, FREET4, T3FREE, THYROIDAB in the last 72 hours. Anemia Panel: No results for input(s): VITAMINB12, FOLATE, FERRITIN, TIBC, IRON, RETICCTPCT in the last 72 hours. Sepsis Labs: Recent Labs  Lab 10/15/20 2307 10/16/20 0251  LATICACIDVEN 5.6* 1.3    Recent Results (from the past 240 hour(s))  Culture, Urine     Status: None   Collection Time: 10/15/20 12:18 AM   Specimen: Urine, Random  Result Value Ref Range Status   Specimen Description URINE, RANDOM  Final   Special  Requests NONE  Final   Culture   Final    NO GROWTH Performed at [facility], [place] [address], [place], [place] [address]    Report Status 10/17/2020 FINAL  Final  Respiratory Panel by RT PCR (Flu A&B,  Covid) - Nasopharyngeal Swab     Status: None   Collection Time: 10/15/20  9:24 PM   Specimen: Nasopharyngeal Swab  Result Value Ref Range Status   SARS Coronavirus 2 by RT PCR NEGATIVE NEGATIVE Final    Comment: (NOTE) SARS-CoV-2 target nucleic acids are NOT DETECTED.  The SARS-CoV-2 RNA is generally detectable in upper respiratoy specimens during the acute phase of infection. The lowest concentration of SARS-CoV-2 viral copies this assay can detect is 131 copies/mL. A negative result does not preclude SARS-Cov-2 infection and should not be used as the sole basis for treatment or other patient management decisions. A negative result may occur with  improper specimen collection/handling, submission of specimen other than nasopharyngeal swab, presence of viral mutation(s) within the areas targeted by this assay, and inadequate number of viral copies (<131 copies/mL). A negative result must be combined with clinical observations, patient history, and epidemiological information. The expected result is Negative.  Fact Sheet for Patients:  PinkCheek.be  Fact Sheet for Healthcare Providers:  GravelBags.it  This test is no t yet approved or cleared by the [place] FDA and  has been authorized for detection and/or diagnosis of SARS-CoV-2 by FDA under an Emergency Use Authorization (EUA). This EUA will remain  in effect (meaning this test can be used) for the duration of the COVID-19 declaration under Section 564(b)(1) of the Act, 21 U.S.C. section 360bbb-3(b)(1), unless the authorization is terminated or revoked sooner.     Influenza A by PCR NEGATIVE NEGATIVE Final   Influenza B by PCR NEGATIVE NEGATIVE Final    Comment: (NOTE) The Xpert Xpress SARS-CoV-2/FLU/RSV assay is intended as an aid in  the diagnosis of influenza from Nasopharyngeal swab specimens and  should not be used as a sole basis for treatment. Nasal  washings and  aspirates are unacceptable for Xpert Xpress SARS-CoV-2/FLU/RSV  testing.  Fact Sheet for Patients: PinkCheek.be  Fact Sheet for Healthcare Providers: GravelBags.it  This test is not yet approved or cleared by the [place] FDA and  has been authorized for detection and/or diagnosis of SARS-CoV-2 by  FDA under an Emergency Use Authorization (EUA). This EUA will remain  in effect (meaning this test can be used) for the duration of the  Covid-19 declaration under Section 564(b)(1) of the Act, 21  U.S.C. section 360bbb-3(b)(1), unless the authorization is  terminated or revoked. Performed at [facility], [place] [address], [place], [place] [address]          Radiology Studies: EEG  Result Date: 10/16/2020 Lora Havens, MD     10/16/2020  2:42 PM Patient Name: Trystin Terhune MRN: [id number] Epilepsy Attending: Lora Havens Referring Physician/Provider:  Dr Inda Merlin Date: 10/16/2020 Duration: 24.14 mins Patient history: 79 year old male who presented with generalized tonic-clonic seizure in the setting of profound electrolyte derangements.  EEG to evaluate for seizures. Level of alertness: Awake, asleep AEDs during EEG study: Ativan Technical aspects: This EEG study was done with scalp electrodes positioned according to the 10-20 International system of electrode placement. Electrical activity was acquired at a sampling rate of 500Hz  and reviewed with a high frequency filter of 70Hz  and a low frequency filter of 1Hz . EEG data were recorded continuously  and digitally stored. Description: The posterior dominant rhythm consists of 9-10 Hz activity of moderate voltage (25-35 uV) seen predominantly in posterior head regions, symmetric and reactive to eye opening and eye closing. Sleep was characterized by vertex waves, sleep spindles (12 to 14 Hz), maximal frontocentral region.  There is an excessive amount  of 15 to 18 Hz  beta activity distributed symmetrically and diffusely.   Hyperventilation and photic stimulation were not performed.   ABNORMALITY -Excessive beta, generalized IMPRESSION: This study is within normal limits. No seizures or epileptiform discharges were seen throughout the recording. The excessive beta activity seen in the background is most likely due to the effect of benzodiazepine and is a benign EEG pattern. Lora Havens   DG Chest 1 View  Result Date: 10/15/2020 CLINICAL DATA:  Evaluate for pneumonia EXAM: CHEST  1 VIEW COMPARISON:  05/12/2020 FINDINGS: Cardiac shadow is mildly enlarged but stable. Aortic calcifications are seen. Lungs are well aerated bilaterally. No focal infiltrate or sizable effusion is seen. No bony abnormality is noted. IMPRESSION: No active disease. Electronically Signed   By: Inez Catalina M.D.   On: 10/15/2020 22:15   DG Pelvis 1-2 Views  Result Date: 10/15/2020 CLINICAL DATA:  Syncope EXAM: PELVIS - 1-2 VIEW COMPARISON:  None. FINDINGS: Mild symmetric degenerative changes within the hips with joint space narrowing and spurring. SI joints symmetric and unremarkable. No acute bony abnormality. Specifically, no fracture, subluxation, or dislocation. Vascular calcifications. IMPRESSION: Mild degenerative changes in the hips.  No acute bony abnormality. Electronically Signed   By: Rolm Baptise M.D.   On: 10/15/2020 20:46   CT ABDOMEN PELVIS W CONTRAST  Result Date: 10/16/2020 CLINICAL DATA:  Cancer of unknown primary. Staging for malignancy in a patient with weakness, poor appetite, and urinary retention. EXAM: CT ABDOMEN AND PELVIS WITH CONTRAST TECHNIQUE: Multidetector CT imaging of the abdomen and pelvis was performed using the standard protocol following bolus administration of intravenous contrast. CONTRAST:  165mL OMNIPAQUE IOHEXOL 300 MG/ML  SOLN COMPARISON:  CT right hip 10/15/2020 FINDINGS: Lower chest: Atelectasis in the lung bases. Cardiac  enlargement. Coronary artery calcifications. Hepatobiliary: No focal liver abnormality is seen. No gallstones, gallbladder wall thickening, or biliary dilatation. Pancreas: Unremarkable. No pancreatic ductal dilatation or surrounding inflammatory changes. Spleen: Normal in size without focal abnormality. Adrenals/Urinary Tract: Adrenal glands are unremarkable. Kidneys are normal, without renal calculi, focal lesion, or hydronephrosis. Bladder is unremarkable. Stomach/Bowel: Stomach, small bowel, and colon are not abnormally distended. No wall thickening or inflammatory infiltration identified. Appendix is normal. Vascular/Lymphatic: Diffuse calcification of the aorta. Small aortic aneurysm measuring 3 cm AP dimension. No significant lymphadenopathy. Reproductive: Prostate is unremarkable. Other: No free air or free fluid in the abdomen. Abdominal wall musculature appears intact. Subcutaneous soft tissue gas in the anterior abdomen likely represents injection site. Musculoskeletal: Degenerative changes in the lumbar spine and hips. Degenerative cyst in the right acetabulum. Degenerative cyst versus bone cyst in the right proximal femur. No destructive bone lesions identified. IMPRESSION: 1. No evidence of primary or metastatic disease in the abdomen or pelvis. 2. Cardiac enlargement with coronary artery calcifications. 3. Small aortic aneurysm measuring 3 cm AP dimension. 4. Degenerative changes in the lumbar spine and hips. 5. Degenerative cyst versus bone cyst in the right proximal femur. Aortic Atherosclerosis (ICD10-I70.0). Electronically Signed   By: Lucienne Capers M.D.   On: 10/16/2020 01:41   CT HIP RIGHT WO CONTRAST  Result Date: 10/16/2020 CLINICAL DATA:  Syncope. Patient found on floor. Evaluate  for hip fracture. EXAM: CT OF THE RIGHT HIP WITHOUT CONTRAST TECHNIQUE: Multidetector CT imaging of the right hip was performed according to the standard protocol. Multiplanar CT image reconstructions were  also generated. COMPARISON:  Right hip radiographs 10/14/2020 FINDINGS: Bones/Joint/Cartilage No evidence of acute fracture or dislocation of the right hip. Degenerative changes are present in the right hip. Subcortical cyst in the posterior acetabulum is likely degenerative. Prominent cyst with surrounding sclerosis demonstrated in the anterior aspect of the right femoral head may represent a degenerative cyst or a bone cyst. No expansile change, periosteal reaction, or bone destruction to suggest an aggressive lesion. No significant joint effusion. Ligaments Suboptimally assessed by CT. Muscles and Tendons Right hip musculature appears intact. No intramuscular mass, collection, or hematoma. Soft tissues Vascular calcifications in the external iliac and common femoral arteries. No soft tissue hematoma or collection. IMPRESSION: 1. No evidence of acute fracture or dislocation of the right hip. 2. Degenerative changes in the right hip. 3. Prominent cyst with surrounding sclerosis in the anterior aspect of the right femoral head may represent a degenerative cyst or a bone cyst. Electronically Signed   By: Lucienne Capers M.D.   On: 10/16/2020 00:03   CT HEAD CODE STROKE WO CONTRAST  Result Date: 10/15/2020 CLINICAL DATA:  Code stroke. Initial evaluation for acute left-sided weakness. EXAM: CT HEAD WITHOUT CONTRAST TECHNIQUE: Contiguous axial images were obtained from the base of the skull through the vertex without intravenous contrast. COMPARISON:  None available. FINDINGS: Brain: Generalized age-related cerebral atrophy with mild-to-moderate chronic microvascular ischemic disease. No acute intracranial hemorrhage. No acute large vessel territory infarct. No mass lesion, midline shift or mass effect. No hydrocephalus or extra-axial fluid collection. Vascular: No hyperdense vessel. Calcified atherosclerosis present at skull base. Skull: Scalp soft tissues and calvarium within normal limits. Sinuses/Orbits:  Globes and orbital soft tissues demonstrate no acute finding. Scattered mucosal thickening noted within the ethmoidal air cells and maxillary sinuses. Mastoid air cells are clear. Other: None. ASPECTS [facility] Stroke Program Early CT Score) - Ganglionic level infarction (caudate, lentiform nuclei, internal capsule, insula, M1-M3 cortex): 7 - Supraganglionic infarction (M4-M6 cortex): 3 Total score (0-10 with 10 being normal): 10 IMPRESSION: 1. No acute intracranial infarct or other abnormality. 2. ASPECTS is 10. 3. Age-related cerebral atrophy with mild-to-moderate chronic microvascular ischemic disease. These results were communicated to Dr. Curly Shores at 7:28 pmon 10/20/2021by text page via the [facility] messaging system. Electronically Signed   By: Jeannine Boga M.D.   On: 10/15/2020 19:30   VAS [place] LOWER EXTREMITY VENOUS (DVT)  Result Date: 10/16/2020  Lower Venous DVTStudy Indications: Pain, and Swelling.  Limitations: Poor ultrasound/tissue interface. Comparison Study: No prior studies. Performing Technologist: Darlin Coco  Examination Guidelines: A complete evaluation includes B-mode imaging, spectral Doppler, color Doppler, and power Doppler as needed of all accessible portions of each vessel. Bilateral testing is considered an integral part of a complete examination. Limited examinations for reoccurring indications may be performed as noted. The reflux portion of the exam is performed with the patient in reverse Trendelenburg.  +---------+---------------+---------+-----------+----------+--------------+ RIGHT    CompressibilityPhasicitySpontaneityPropertiesThrombus Aging +---------+---------------+---------+-----------+----------+--------------+ CFV      Full           Yes      Yes                                 +---------+---------------+---------+-----------+----------+--------------+ SFJ      Full                                                         +---------+---------------+---------+-----------+----------+--------------+  FV Prox  Full                                                        +---------+---------------+---------+-----------+----------+--------------+ FV Mid   Full                                                        +---------+---------------+---------+-----------+----------+--------------+ FV DistalFull                                                        +---------+---------------+---------+-----------+----------+--------------+ PFV      Full                                                        +---------+---------------+---------+-----------+----------+--------------+ POP      Full           Yes      Yes                                 +---------+---------------+---------+-----------+----------+--------------+ PTV      Full                                                        +---------+---------------+---------+-----------+----------+--------------+ PERO                                                  Not visualized +---------+---------------+---------+-----------+----------+--------------+   +----+---------------+---------+-----------+----------+--------------+ LEFTCompressibilityPhasicitySpontaneityPropertiesThrombus Aging +----+---------------+---------+-----------+----------+--------------+ CFV Full           Yes      Yes                                 +----+---------------+---------+-----------+----------+--------------+     Summary: RIGHT: - There is no evidence of deep vein thrombosis in the lower extremity.  - A complex cystic structure is found in the popliteal fossa.  LEFT: - No evidence of common femoral vein obstruction.  *See table(s) above for measurements and observations. Electronically signed by Servando Snare MD on 10/16/2020 at 4:11:42 PM.    Final     Scheduled Meds: . atorvastatin  10 mg Oral Daily  . enoxaparin (LOVENOX) injection  40 mg  Subcutaneous Q24H  . folic acid  1 mg Oral Daily  . insulin aspart  0-9 Units Subcutaneous TID AC & HS  . irbesartan  300 mg Oral Daily  . metoprolol succinate  50 mg Oral Daily  .  multivitamin with minerals  1 tablet Oral Daily  . pantoprazole  40 mg Oral BID  . potassium chloride  40 mEq Oral Once  . thiamine  100 mg Oral Daily   Or  . thiamine  100 mg Intravenous Daily   Continuous Infusions: . calcium gluconate    . magnesium sulfate bolus IVPB       LOS: 1 day    Time spent: 25 mins.    Shawna Clamp, MD [facility]   If 7PM-7AM, please contact night-coverage

## 2020-10-18 DIAGNOSIS — R569 Unspecified convulsions: Secondary | ICD-10-CM | POA: Diagnosis not present

## 2020-10-18 DIAGNOSIS — E876 Hypokalemia: Secondary | ICD-10-CM | POA: Diagnosis not present

## 2020-10-18 LAB — COMPREHENSIVE METABOLIC PANEL
ALT: 43 U/L (ref 0–44)
AST: 71 U/L — ABNORMAL HIGH (ref 15–41)
Albumin: 3 g/dL — ABNORMAL LOW (ref 3.5–5.0)
Alkaline Phosphatase: 44 U/L (ref 38–126)
Anion gap: 13 (ref 5–15)
BUN: 6 mg/dL — ABNORMAL LOW (ref 8–23)
CO2: 21 mmol/L — ABNORMAL LOW (ref 22–32)
Calcium: 7.1 mg/dL — ABNORMAL LOW (ref 8.9–10.3)
Chloride: 101 mmol/L (ref 98–111)
Creatinine, Ser: 0.8 mg/dL (ref 0.61–1.24)
GFR, Estimated: >60 mL/min (ref >60)
Glucose, Bld: 136 mg/dL — ABNORMAL HIGH (ref 70–99)
Potassium: 3 mmol/L — ABNORMAL LOW (ref 3.5–5.1)
Sodium: 135 mmol/L (ref 135–145)
Total Bilirubin: 1.3 mg/dL — ABNORMAL HIGH (ref 0.3–1.2)
Total Protein: 6.1 g/dL — ABNORMAL LOW (ref 6.5–8.1)

## 2020-10-18 LAB — CBC
HCT: 33.3 % — ABNORMAL LOW (ref 39.0–52.0)
Hemoglobin: 11.4 g/dL — ABNORMAL LOW (ref 13.0–17.0)
MCH: 31.7 pg (ref 26.0–34.0)
MCHC: 34.2 g/dL (ref 30.0–36.0)
MCV: 92.5 fL (ref 80.0–100.0)
Platelets: 243 10*3/uL (ref 150–400)
RBC: 3.6 MIL/uL — ABNORMAL LOW (ref 4.22–5.81)
RDW: 12.9 % (ref 11.5–15.5)
WBC: 6.6 10*3/uL (ref 4.0–10.5)
nRBC: 0 % (ref 0.0–0.2)

## 2020-10-18 LAB — GLUCOSE, CAPILLARY
Glucose-Capillary: 150 mg/dL — ABNORMAL HIGH (ref 70–99)
Glucose-Capillary: 181 mg/dL — ABNORMAL HIGH (ref 70–99)
Glucose-Capillary: 168 mg/dL — ABNORMAL HIGH (ref 70–99)
Glucose-Capillary: 186 mg/dL — ABNORMAL HIGH (ref 70–99)

## 2020-10-18 LAB — MAGNESIUM: Magnesium: 2.5 mg/dL — ABNORMAL HIGH (ref 1.7–2.4)

## 2020-10-18 LAB — PHOSPHORUS: Phosphorus: 2.7 mg/dL (ref 2.5–4.6)

## 2020-10-18 MED ORDER — LORAZEPAM 1 MG PO TABS
1.0000 mg | ORAL_TABLET | ORAL | Status: AC | PRN
  Administered 2020-10-19: 1 mg via ORAL
  Administered 2020-10-20 (×2): 2 mg via ORAL
  Administered 2020-10-20: 1 mg via ORAL
  Administered 2020-10-20: 2 mg via ORAL
  Filled 2020-10-18 (×3): qty 2
  Filled 2020-10-18 (×2): qty 1

## 2020-10-18 MED ORDER — LORAZEPAM 2 MG/ML IJ SOLN
1.0000 mg | INTRAMUSCULAR | Status: AC | PRN
  Administered 2020-10-20 (×2): 1 mg via INTRAVENOUS
  Administered 2020-10-21: 2 mg via INTRAVENOUS
  Administered 2020-10-21 (×2): 1 mg via INTRAVENOUS
  Filled 2020-10-18 (×5): qty 1

## 2020-10-18 MED ORDER — POTASSIUM CHLORIDE 20 MEQ PO PACK
40.0000 meq | PACK | Freq: Two times a day (BID) | ORAL | Status: AC
  Administered 2020-10-18 (×2): 40 meq via ORAL
  Filled 2020-10-18 (×2): qty 2

## 2020-10-18 NOTE — Progress Notes (Signed)
PROGRESS NOTE    Devin Garza  NTZ:[id number] DOB: 09/05/1941 DOA: 10/15/2020 PCP: Isaac Bliss, Rayford Halsted, MD    Brief Narrative: 79 years old male with PMH of hypertension hyperlipidemia osteoarthritis upper GI bleeding in 2017 secondary to gastric and duodenal AVMs, depression, basal cell carcinoma presents in the emergency department after being found down in his home.  Patient is poor historian. Most of history is obtained from EMS and ED staff.  Patient brought to the emergency department as a stroke code and was immediately seen by Neurologist Dr. Curly Shores, CT head was negative for stroke. Patient found to have severe hypomagnesemia,  hypokalemia and hypocalcemia.  Patient had an episode of tonic-clonic activity with evidence of postictal state after an episode.  Patient was given Keppra.  Neurology recommended MRI and EEG.  EEG is negative for any seizures.  MRI: No evidence of acute infarction, hemorrhage, or mass. Mild chronic microvascular ischemic changes. Neurology signed off. Patient is more alert, awake.   Assessment & Plan:   Principal Problem:   Seizure ([place]) Active Problems:   Mixed hyperlipidemia   Essential hypertension   Prediabetes   GERD without esophagitis   Hypokalemia   Hypomagnesemia   Hyperglycemia   Right hip pain   Acute urinary retention   History of alcohol abuse   Fall   Pressure injury of skin   Seizure [facility])   Patient initially presented to the emergency department after being found down in his place of residence.  Because of patient's fall was initially not clear but with the 1 minute episode of tonic-clonic seizure-like activity with notable postictal state witnessed in the emergency department it is likely that the initial episode that the patient experienced at home was also a seizure.  Etiology of seizures are possibly related to hypovolemia or severe electrolyte disturbance( low ca, Low Mag, Low K)  Per chart review patient has a  history of alcohol abuse and is still actively drinking at least 2 glasses of wine daily according to outpatient notes.  If patient is drinking more heavily an element of alcohol intoxication or withdrawal may be contributing to the seizure activity.  Chest x-ray, unremarkable, UA unremarkable.       EEG no evidence of seizures.  MRI : No evidence of acute infarction, hemorrhage, or mass. Mild chronic microvascular ischemic changes.  Patient received 1000 mg of Keppra in the ED,  Keppra discontinued as per Neuro. NO need for AED  Seizure precautions.  Monitoring patient on telemetry  As needed Ativan for repeat seizure activity.  Neurology signed off.  History of alcohol abuse   Patient has endorsed drinking at least 2 to 3 glasses of wine daily   We will place patient on CIWA protocol with as needed benzodiazepines for withdrawal symptoms  Placing patient on thiamine and folate supplementation.       Patient remained agitated and restless,        MRI : No evidence of acute infarction, hemorrhage, or mass. Mild chronic microvascular ischemic changes.  Acute urinary retention   Patient reporting lower abdominal discomfort with inability to urinate in the emergency room  UA shows hemoglobin and ketones otherwise negative for LE and nitrites.  Urine culture pending  Considering vague presentation with malaise, fatigue and seizure, we will additionally obtain CT imaging of the abdomen and pelvis to evaluate for any evidence of insidious malignancy.  CT abdomen pelvis negative for any malignancy.  No history of BPH although considering patient's age this could  very well be the cause.    Essential hypertension     Continue home regimen of antihypertensives.    Prediabetes   Known history of prediabetes  Patient is hyperglycemic upon arrival.  Accu-Cheks before every meal and nightly for now    Hypokalemia   Patient exhibiting multiple electrolyte abnormalities  including substantial hypokalemia and severe hypomagnesia  Replacement in progress, recheck labs in the morning.    Hypomagnesemia >> Improved.   Replacement given,  continues to remain hypomagnesemic.    Ordered magnesium sulfate 4 g IV again  Hypocalcemia   Replacing with intravenous calcium gluconate  Obtain repeat calcium level in the morning along with ionized calcium level    Right hip pain   Patient complaining of a 1 week history of right lower extremity swelling as well as right hip pain.   Hip x-ray performed here in the emergency department reveals no evidence of fracture.  On examination, patient has concurrent right lower extremity edema which the patient states is new.    Patient additionally has right hip pain that is reproducible with isolation of the right hip joint.   .   CT Hip joint No evidence of acute fracture or dislocation of the right hip.   right lower extremity ultrasound ruled out DVT.    GERD without esophagitis   Longstanding history of GERD with known history of gastric and duodenal AVMs  Transitioning patient from home regimen of Nexium to Protonix 40 mg twice daily    Mixed hyperlipidemia  Continue home regimen of statin therapy   DVT prophylaxis: SCDS Code Status: full Family Communication:  No one at bed side. Disposition Plan:   Status is: Inpatient  Remains inpatient appropriate because:Inpatient level of care appropriate due to severity of illness   Dispo: The patient is from: Home              Anticipated d/c is to: SNF              Anticipated d/c date is: 2 days              Patient currently is not medically stable to d/c.   Consultants:    Neurology  Procedures:  Antimicrobials:  Anti-infectives (From admission, onward)   None     Subjective: Patient was seen and examined at bedside.  He appears more alert and  oriented x 3 today.  Overnight events noted. MRI : No evidence of acute infarction,  hemorrhage, or mass. Mild chronic microvascular ischemic changes.  Objective: Vitals:   10/17/20 2011 10/18/20 0207 10/18/20 0456 10/18/20 1053  BP: (!) 164/91 (!) 152/93 (!) 145/70 (!) 156/80  Pulse: (!) 125 98 (!) 101 (!) 103  Resp: 18 18 18 16   Temp: 99.8 F (37.7 C) 99.1 F (37.3 C) 99.4 F (37.4 C) 99.8 F (37.7 C)  TempSrc: Oral Oral Oral Oral  SpO2: 98% 93% 95% 93%  Weight:      Height:        Intake/Output Summary (Last 24 hours) at 10/18/2020 1331 Last data filed at 10/18/2020 1000 Gross per 24 hour  Intake 1140 ml  Output 726 ml  Net 414 ml   Filed Weights   10/15/20 1942 10/16/20 1602  Weight: 80 kg 78.9 kg    Examination:  General exam: Appears calm and comfortable.  Calm and oriented x 3 Respiratory system: Clear to auscultation. Respiratory effort normal. Cardiovascular system: S1 & S2 heard, RRR. No JVD, murmurs, rubs, gallops or  clicks. No pedal edema. Gastrointestinal system: Abdomen is nondistended, soft and nontender. No organomegaly or masses felt. Normal bowel sounds heard. Central nervous system: Alert and oriented. No focal neurological deficits. Extremities:  No edema, no cyanosis, no clubbing. Skin: No rashes, lesions or ulcers Psychiatry:  Mood & affect appropriate.     Data Reviewed: I have personally reviewed following labs and imaging studies  CBC: Recent Labs  Lab 10/15/20 1905 10/15/20 1905 10/15/20 1916 10/15/20 2211 10/16/20 0251 10/16/20 2249 10/18/20 0344  WBC 6.8  --   --   --  7.6 7.4 6.6  NEUTROABS 4.5  --   --   --  5.8  --   --   HGB 12.1*   < > 12.2* 12.2* 10.1* 10.9* 11.4*  HCT 36.1*   < > 36.0* 36.0* 29.7* 31.5* 33.3*  MCV 92.8  --   --   --  92.0 91.3 92.5  PLT 267  --   --   --  231 237 243   < > = values in this interval not displayed.   Basic Metabolic Panel: Recent Labs  Lab 10/16/20 0251 10/16/20 1119 10/16/20 2249 10/17/20 0940 10/18/20 0344  NA 135 138 136 138 135  K 3.2* 3.1* 3.6 3.4* 3.0*   CL 103 102 102 103 101  CO2 20* 21* 21* 22 21*  GLUCOSE 131* 110* 144* 140* 136*  BUN 9 6* 7* 7* 6*  CREATININE 0.85 0.87 0.96 0.84 0.80  CALCIUM 6.8* 7.1* 7.0* 6.9* 7.1*  MG 1.0* 0.8* 1.8 1.5* 2.5*  PHOS  --  2.5 2.9 2.7 2.7   GFR: Estimated Creatinine Clearance: 76 mL/min (by C-G formula based on SCr of 0.8 mg/dL). Liver Function Tests: Recent Labs  Lab 10/16/20 0251 10/16/20 1119 10/16/20 2249 10/17/20 0940 10/18/20 0344  AST 58* 97* 106* 98* 71*  ALT 32 40 45* 47* 43  ALKPHOS 39 39 46 43 44  BILITOT 0.6 1.1 1.1 1.1 1.3*  PROT 5.8* 5.7* 6.1* 6.1* 6.1*  ALBUMIN 3.1* 3.2* 3.3* 3.1* 3.0*   No results for input(s): LIPASE, AMYLASE in the last 168 hours. Recent Labs  Lab 10/17/20 0940  AMMONIA 39*   Coagulation Profile: Recent Labs  Lab 10/15/20 1905  INR 1.2   Cardiac Enzymes: No results for input(s): CKTOTAL, CKMB, CKMBINDEX, TROPONINI in the last 168 hours. BNP (last 3 results) No results for input(s): PROBNP in the last 8760 hours. HbA1C: Recent Labs    10/16/20 0251  HGBA1C 5.7*   CBG: Recent Labs  Lab 10/17/20 1155 10/17/20 1625 10/17/20 2113 10/18/20 0601 10/18/20 1132  GLUCAP 117* 118* 144* 150* 181*   Lipid Profile: No results for input(s): CHOL, HDL, LDLCALC, TRIG, CHOLHDL, LDLDIRECT in the last 72 hours. Thyroid Function Tests: No results for input(s): TSH, T4TOTAL, FREET4, T3FREE, THYROIDAB in the last 72 hours. Anemia Panel: No results for input(s): VITAMINB12, FOLATE, FERRITIN, TIBC, IRON, RETICCTPCT in the last 72 hours. Sepsis Labs: Recent Labs  Lab 10/15/20 2307 10/16/20 0251  LATICACIDVEN 5.6* 1.3    Recent Results (from the past 240 hour(s))  Culture, Urine     Status: None   Collection Time: 10/15/20 12:18 AM   Specimen: Urine, Random  Result Value Ref Range Status   Specimen Description URINE, RANDOM  Final   Special Requests NONE  Final   Culture   Final    NO GROWTH Performed at [facility], [address] [place], [place] [address]  Report Status 10/17/2020 FINAL  Final  Respiratory Panel by RT PCR (Flu A&B, Covid) - Nasopharyngeal Swab     Status: None   Collection Time: 10/15/20  9:24 PM   Specimen: Nasopharyngeal Swab  Result Value Ref Range Status   SARS Coronavirus 2 by RT PCR NEGATIVE NEGATIVE Final    Comment: (NOTE) SARS-CoV-2 target nucleic acids are NOT DETECTED.  The SARS-CoV-2 RNA is generally detectable in upper respiratoy specimens during the acute phase of infection. The lowest concentration of SARS-CoV-2 viral copies this assay can detect is 131 copies/mL. A negative result does not preclude SARS-Cov-2 infection and should not be used as the sole basis for treatment or other patient management decisions. A negative result may occur with  improper specimen collection/handling, submission of specimen other than nasopharyngeal swab, presence of viral mutation(s) within the areas targeted by this assay, and inadequate number of viral copies (<131 copies/mL). A negative result must be combined with clinical observations, patient history, and epidemiological information. The expected result is Negative.  Fact Sheet for Patients:  PinkCheek.be  Fact Sheet for Healthcare Providers:  GravelBags.it  This test is no t yet approved or cleared by the [place] FDA and  has been authorized for detection and/or diagnosis of SARS-CoV-2 by FDA under an Emergency Use Authorization (EUA). This EUA will remain  in effect (meaning this test can be used) for the duration of the COVID-19 declaration under Section 564(b)(1) of the Act, 21 U.S.C. section 360bbb-3(b)(1), unless the authorization is terminated or revoked sooner.     Influenza A by PCR NEGATIVE NEGATIVE Final   Influenza B by PCR NEGATIVE NEGATIVE Final    Comment: (NOTE) The Xpert Xpress SARS-CoV-2/FLU/RSV assay is intended as an aid in  the diagnosis of  influenza from Nasopharyngeal swab specimens and  should not be used as a sole basis for treatment. Nasal washings and  aspirates are unacceptable for Xpert Xpress SARS-CoV-2/FLU/RSV  testing.  Fact Sheet for Patients: PinkCheek.be  Fact Sheet for Healthcare Providers: GravelBags.it  This test is not yet approved or cleared by the [place] FDA and  has been authorized for detection and/or diagnosis of SARS-CoV-2 by  FDA under an Emergency Use Authorization (EUA). This EUA will remain  in effect (meaning this test can be used) for the duration of the  Covid-19 declaration under Section 564(b)(1) of the Act, 21  U.S.C. section 360bbb-3(b)(1), unless the authorization is  terminated or revoked. Performed at [facility], [place] [address], [place], [place] [address]          Radiology Studies: EEG  Result Date: 10/16/2020 Lora Havens, MD     10/16/2020  2:42 PM Patient Name: Tereso Unangst MRN: [id number] Epilepsy Attending: Lora Havens Referring Physician/Provider:  Dr Inda Merlin Date: 10/16/2020 Duration: 24.14 mins Patient history: 79 year old male who presented with generalized tonic-clonic seizure in the setting of profound electrolyte derangements.  EEG to evaluate for seizures. Level of alertness: Awake, asleep AEDs during EEG study: Ativan Technical aspects: This EEG study was done with scalp electrodes positioned according to the 10-20 International system of electrode placement. Electrical activity was acquired at a sampling rate of 500Hz  and reviewed with a high frequency filter of 70Hz  and a low frequency filter of 1Hz . EEG data were recorded continuously and digitally stored. Description: The posterior dominant rhythm consists of 9-10 Hz activity of moderate voltage (25-35 uV) seen predominantly in posterior head regions, symmetric and reactive to eye opening and eye  closing. Sleep was characterized  by vertex waves, sleep spindles (12 to 14 Hz), maximal frontocentral region.  There is an excessive amount of 15 to 18 Hz  beta activity distributed symmetrically and diffusely.   Hyperventilation and photic stimulation were not performed.   ABNORMALITY -Excessive beta, generalized IMPRESSION: This study is within normal limits. No seizures or epileptiform discharges were seen throughout the recording. The excessive beta activity seen in the background is most likely due to the effect of benzodiazepine and is a benign EEG pattern. Priyanka Barbra Sarks   MR ANGIO HEAD WO CONTRAST  Result Date: 10/17/2020 CLINICAL DATA:  Altered mental status, seizure EXAM: MRI HEAD WITHOUT CONTRAST MRA HEAD WITHOUT CONTRAST TECHNIQUE: Multiplanar, multiecho pulse sequences of the brain and surrounding structures were obtained without intravenous contrast. Angiographic images of the head were obtained using MRA technique without contrast. COMPARISON:  None. FINDINGS: Motion artifact is present. Below findings are within this limitation. MRI HEAD Brain: There is no acute infarction or intracranial hemorrhage. There is no intracranial mass, mass effect, or edema. There is no hydrocephalus or extra-axial fluid collection. Prominence of the ventricles and sulci reflects generalized parenchymal volume loss. Patchy T2 hyperintensity in the supratentorial white matter is nonspecific but may reflect mild chronic microvascular ischemic changes. Vascular: Major vessel flow voids at the skull base are preserved. Skull and upper cervical spine: Normal marrow signal is within normal limits. Sinuses/Orbits: Mild mucosal thick. Orbits are grossly unremarkable. Other: Sella is unremarkable.  Mastoid air cells are clear. MRA HEAD Intracranial internal carotid arteries are patent. Middle and anterior cerebral arteries are patent. Intracranial vertebral arteries, basilar artery, posterior cerebral arteries are patent. There is no significant stenosis  or aneurysm. IMPRESSION: Motion degraded study. No evidence of acute infarction, hemorrhage, or mass. Mild chronic microvascular ischemic changes. No proximal intracranial vessel occlusion or significant stenosis. Electronically Signed   By: Macy Mis M.D.   On: 10/17/2020 15:53   MR BRAIN WO CONTRAST  Result Date: 10/17/2020 CLINICAL DATA:  Altered mental status, seizure EXAM: MRI HEAD WITHOUT CONTRAST MRA HEAD WITHOUT CONTRAST TECHNIQUE: Multiplanar, multiecho pulse sequences of the brain and surrounding structures were obtained without intravenous contrast. Angiographic images of the head were obtained using MRA technique without contrast. COMPARISON:  None. FINDINGS: Motion artifact is present. Below findings are within this limitation. MRI HEAD Brain: There is no acute infarction or intracranial hemorrhage. There is no intracranial mass, mass effect, or edema. There is no hydrocephalus or extra-axial fluid collection. Prominence of the ventricles and sulci reflects generalized parenchymal volume loss. Patchy T2 hyperintensity in the supratentorial white matter is nonspecific but may reflect mild chronic microvascular ischemic changes. Vascular: Major vessel flow voids at the skull base are preserved. Skull and upper cervical spine: Normal marrow signal is within normal limits. Sinuses/Orbits: Mild mucosal thick. Orbits are grossly unremarkable. Other: Sella is unremarkable.  Mastoid air cells are clear. MRA HEAD Intracranial internal carotid arteries are patent. Middle and anterior cerebral arteries are patent. Intracranial vertebral arteries, basilar artery, posterior cerebral arteries are patent. There is no significant stenosis or aneurysm. IMPRESSION: Motion degraded study. No evidence of acute infarction, hemorrhage, or mass. Mild chronic microvascular ischemic changes. No proximal intracranial vessel occlusion or significant stenosis. Electronically Signed   By: Macy Mis M.D.   On:  10/17/2020 15:53    Scheduled Meds: . atorvastatin  10 mg Oral Daily  . calcium carbonate  200 mg of elemental calcium Oral BID  . enoxaparin (LOVENOX) injection  40 mg Subcutaneous Q24H  . folic acid  1 mg Oral Daily  . insulin aspart  0-9 Units Subcutaneous TID AC & HS  . irbesartan  300 mg Oral Daily  . metoprolol succinate  50 mg Oral Daily  . multivitamin with minerals  1 tablet Oral Daily  . pantoprazole  40 mg Oral BID  . potassium chloride  40 mEq Oral BID  . thiamine  100 mg Oral Daily   Or  . thiamine  100 mg Intravenous Daily   Continuous Infusions:    LOS: 2 days    Time spent: 25 mins.    Shawna Clamp, MD [facility]   If 7PM-7AM, please contact night-coverage

## 2020-10-18 NOTE — Evaluation (Signed)
Physical Therapy Evaluation Patient Details Name: Devin Garza MRN: [id number] DOB: October 06, 1941 Today's Date: 10/18/2020   History of Present Illness  79yo male arriving via EMS after being found down at home. Initially presented as a code CVA but this was ruled out by neuro. Imaging negative for R hip injury. Had tonic-clonic activity with clear post-ictal state in the ED. Admitted for possible seizure activity. PMH HLD, HTN, OA, ankle surgery  Clinical Impression   Patient received in bed, sleeping initially but waking easily and willing to participate with therapy- states "I'm old and there's nothing I can do about it besides don't old". Able to get to EOB and perform functional transfers with heavy MaxA and RW, also able to tolerate short distance gait training in hallway with MinA to help with managing RW as well as to steer around obstacles on the left side. Does appear to have moderate visual impairment on the left. Left up in recliner with all needs met, safety sitter present and attending. Would very much benefit from SNF and 24/7A moving forward.     Follow Up Recommendations SNF;Supervision/Assistance - 24 hour    Equipment Recommendations  Rolling walker with 5" wheels    Recommendations for Other Services       Precautions / Restrictions Precautions Precautions: Fall;Other (comment) Precaution Comments: intermittent agitation Restrictions Weight Bearing Restrictions: No      Mobility  Bed Mobility Overal bed mobility: Needs Assistance Bed Mobility: Supine to Sit     Supine to sit: Max assist     General bed mobility comments: needed help initating to get BLEs off EOB, then required heavy MaxA to bring trunk up and scoot hips all the way around to EOB    Transfers Overall transfer level: Needs assistance Equipment used: Rolling walker (2 wheeled) Transfers: Sit to/from Stand Sit to Stand: Max assist         General transfer comment: heavy maxA to boost up  to full upright standing with RW and to gain balance  Ambulation/Gait Ambulation/Gait assistance: Min assist Gait Distance (Feet): 80 Feet Assistive device: Rolling walker (2 wheeled) Gait Pattern/deviations: Step-through pattern;Decreased step length - right;Decreased step length - left;Decreased stride length;Decreased dorsiflexion - right;Decreased dorsiflexion - left;Trunk flexed Gait velocity: decreased   General Gait Details: slow with RW and requiring MinA for RW navigation in room and hallway- seems to have moderate visual deficit on the left side-, tends to ambulate with B LEs in external rotation  Stairs            Wheelchair Mobility    Modified Rankin (Stroke Patients Only)       Balance Overall balance assessment: Needs assistance;History of Falls Sitting-balance support: Feet supported Sitting balance-Leahy Scale: Fair Sitting balance - Comments: min guard statically   Standing balance support: Bilateral upper extremity supported;During functional activity Standing balance-Leahy Scale: Poor Standing balance comment: heavy reliance on BUE support, external assist with RW                             Pertinent Vitals/Pain Pain Assessment: Faces Faces Pain Scale: Hurts a little bit Pain Location: generalized discomfort with mobiilty Pain Descriptors / Indicators: Aching;Discomfort Pain Intervention(s): Limited activity within patient's tolerance;Monitored during session;Repositioned    Home Living                   Additional Comments: patient somewhat of an unclear historian- reports he lives alone but at a  nursing faciilty and has a cane that he drags but does not really use. No falls recently. Unclear on how much physical assist he needed.    Prior Function Level of Independence: Independent with assistive device(s)               Hand Dominance        Extremity/Trunk Assessment   Upper Extremity Assessment Upper Extremity  Assessment: Defer to OT evaluation    Lower Extremity Assessment Lower Extremity Assessment: Generalized weakness    Cervical / Trunk Assessment Cervical / Trunk Assessment: Kyphotic  Communication   Communication: No difficulties  Cognition Arousal/Alertness: Awake/alert Behavior During Therapy: Agitated;Impulsive Overall Cognitive Status: Impaired/Different from baseline Area of Impairment: Attention;Memory;Following commands;Safety/judgement;Awareness;Problem solving                   Current Attention Level: Sustained Memory: Decreased short-term memory;Decreased recall of precautions Following Commands: Follows one step commands inconsistently;Follows one step commands with increased time Safety/Judgement: Decreased awareness of safety;Decreased awareness of deficits Awareness: Intellectual Problem Solving: Slow processing;Decreased initiation;Difficulty sequencing;Requires verbal cues;Requires tactile cues General Comments: cooperative with therapy but muttering under his breath, tells me he had been slacking on exercise recently and he needs to move around more      General Comments      Exercises     Assessment/Plan    PT Assessment Patient needs continued PT services  PT Problem List Decreased strength;Decreased cognition;Decreased knowledge of use of DME;Decreased activity tolerance;Decreased safety awareness;Decreased balance;Decreased mobility;Decreased coordination       PT Treatment Interventions DME instruction;Balance training;Gait training;Stair training;Cognitive remediation;Functional mobility training;Patient/family education;Therapeutic activities;Therapeutic exercise    PT Goals (Current goals can be found in the Care Plan section)  Acute Rehab PT Goals Patient Stated Goal: get out of here PT Goal Formulation: With patient Time For Goal Achievement: 11/01/20 Potential to Achieve Goals: Fair    Frequency Min 2X/week   Barriers to  discharge        Co-evaluation               AM-PAC PT "6 Clicks" Mobility  Outcome Measure Help needed turning from your back to your side while in a flat bed without using bedrails?: A Little Help needed moving from lying on your back to sitting on the side of a flat bed without using bedrails?: A Lot Help needed moving to and from a bed to a chair (including a wheelchair)?: A Lot Help needed standing up from a chair using your arms (e.g., wheelchair or bedside chair)?: A Lot Help needed to walk in hospital room?: A Little Help needed climbing 3-5 steps with a railing? : Total 6 Click Score: 13    End of Session Equipment Utilized During Treatment: Gait belt Activity Tolerance: Patient tolerated treatment well Patient left: in chair;with call bell/phone within reach;with nursing/sitter in room Nurse Communication: Mobility status PT Visit Diagnosis: Unsteadiness on feet (R26.81);Difficulty in walking, not elsewhere classified (R26.2);Muscle weakness (generalized) (M62.81);History of falling (Z91.81)    Time: 7902-4097 PT Time Calculation (min) (ACUTE ONLY): 23 min   Charges:   PT Evaluation $PT Eval Moderate Complexity: 1 Mod PT Treatments $Gait Training: 8-22 mins        Windell Norfolk, DPT, PN1   Supplemental Physical Therapist [place]    Pager [phone] Acute Rehab Office [phone]

## 2020-10-18 NOTE — Progress Notes (Signed)
PT Cancellation Note  Patient Details Name: Devin Garza MRN: [id number] DOB: 1941/01/08   Cancelled Treatment:    Reason Eval/Treat Not Completed: Other (comment) patient currently occupied getting a bath with nursing staff. Will attempt to return if time/schedule allow.    Windell Norfolk, DPT, PN1   Supplemental Physical Therapist [facility]    Pager [phone] Acute Rehab Office [phone]

## 2020-10-18 NOTE — Progress Notes (Signed)
Neurology Progress Note   S:// Seen and examined.  Reports no recollection of seeing me yesterday. Appeared much more oriented today-see detailed exam below Overnight-observed sitting in his room on the floor by bedside between siderails with no injuries noted.  O:// Current vital signs: BP (!) 145/70 (BP Location: Left Arm)    Pulse (!) 101    Temp 99.4 F (37.4 C) (Oral)    Resp 18    Ht 5\' 10"  (1.778 m)    Wt 78.9 kg    SpO2 95%    BMI 24.95 kg/m  Vital signs in last 24 hours: Temp:  [98.1 F (36.7 C)-99.8 F (37.7 C)] 99.4 F (37.4 C) (10/23 0456) Pulse Rate:  [79-125] 101 (10/23 0456) Resp:  [18-20] 18 (10/23 0456) BP: (140-164)/(51-93) 145/70 (10/23 0456) SpO2:  [93 %-100 %] 95 % (10/23 0456) General: Awake alert in no distress HEENT: Normocephalic, atraumatic Lungs: Clear Cardiovascular: Regular rate rhythm Abdomen soft nondistended nontender Extremities warm well perfused Neurological exam Awake alert oriented x3 today Speech is not dysarthric Is able to tell me the current president, the president before him President Trump as well as before him Associate Professor. Is able to name objects, repeat sentences and follow commands. Cranial nerves: Pupils equal round react light, extraocular movements intact, visual fields full, face appears symmetric, facial sensation intact Motor exam: Both upper extremities are antigravity with 4+/5 strength with some asterixis.  Left lower extremity is 4+/5.  Right lower extremity is limited by pain but is antigravity-says it has been this way for 10 years. Sensory exam intact Coordination: No obvious gross dysmetria   Medications  Current Facility-Administered Medications:    acetaminophen (TYLENOL) tablet 650 mg, 650 mg, Oral, Q6H PRN, 650 mg at 10/16/20 0350 **OR** acetaminophen (TYLENOL) suppository 650 mg, 650 mg, Rectal, Q6H PRN, Shalhoub, Sherryll Burger, MD   atorvastatin (LIPITOR) tablet 10 mg, 10 mg, Oral, Daily, Shalhoub, Sherryll Burger, MD, 10 mg at 10/17/20 1129   barrier cream (non-specified) 1 application, 1 application, Topical, BID PRN, Shawna Clamp, MD   calcium carbonate (TUMS - dosed in mg elemental calcium) chewable tablet 200 mg of elemental calcium, 200 mg of elemental calcium, Oral, BID, Opyd, Ilene Qua, MD   enoxaparin (LOVENOX) injection 40 mg, 40 mg, Subcutaneous, Q24H, Shalhoub, Sherryll Burger, MD, 40 mg at 06/21/93 8546   folic acid (FOLVITE) tablet 1 mg, 1 mg, Oral, Daily, Shalhoub, Sherryll Burger, MD, 1 mg at 10/16/20 1100   haloperidol lactate (HALDOL) injection 2 mg, 2 mg, Intravenous, Q6H PRN, Opyd, Ilene Qua, MD, 2 mg at 10/16/20 2204   insulin aspart (novoLOG) injection 0-9 Units, 0-9 Units, Subcutaneous, TID AC & HS, Shalhoub, Sherryll Burger, MD, 1 Units at 10/18/20 2703   irbesartan (AVAPRO) tablet 300 mg, 300 mg, Oral, Daily, Shalhoub, Sherryll Burger, MD, 300 mg at 10/16/20 1107   LORazepam (ATIVAN) tablet 1-4 mg, 1-4 mg, Oral, Q1H PRN, 4 mg at 10/17/20 1707 **OR** LORazepam (ATIVAN) injection 1-4 mg, 1-4 mg, Intravenous, Q1H PRN, Vernelle Emerald, MD, 2 mg at 10/17/20 0342   metoprolol succinate (TOPROL-XL) 24 hr tablet 50 mg, 50 mg, Oral, Daily, Shalhoub, Sherryll Burger, MD, 50 mg at 10/16/20 1106   multivitamin with minerals tablet 1 tablet, 1 tablet, Oral, Daily, Shalhoub, Sherryll Burger, MD, 1 tablet at 10/16/20 1100   ondansetron (ZOFRAN) tablet 4 mg, 4 mg, Oral, Q6H PRN **OR** ondansetron (ZOFRAN) injection 4 mg, 4 mg, Intravenous, Q6H PRN, Shalhoub, Sherryll Burger, MD   pantoprazole (  PROTONIX) EC tablet 40 mg, 40 mg, Oral, BID, Shalhoub, Sherryll Burger, MD, 40 mg at 10/16/20 1106   polyethylene glycol (MIRALAX / GLYCOLAX) packet 17 g, 17 g, Oral, Daily PRN, Shalhoub, Sherryll Burger, MD   thiamine tablet 100 mg, 100 mg, Oral, Daily, 100 mg at 10/16/20 1100 **OR** thiamine (B-1) injection 100 mg, 100 mg, Intravenous, Daily, Vernelle Emerald, MD Labs CBC    Component Value Date/Time   WBC 6.6 10/18/2020 0344   RBC 3.60 (L)  10/18/2020 0344   HGB 11.4 (L) 10/18/2020 0344   HCT 33.3 (L) 10/18/2020 0344   PLT 243 10/18/2020 0344   MCV 92.5 10/18/2020 0344   MCH 31.7 10/18/2020 0344   MCHC 34.2 10/18/2020 0344   RDW 12.9 10/18/2020 0344   LYMPHSABS 0.7 10/16/2020 0251   MONOABS 1.0 10/16/2020 0251   EOSABS 0.0 10/16/2020 0251   BASOSABS 0.0 10/16/2020 0251    CMP     Component Value Date/Time   NA 135 10/18/2020 0344   K 3.0 (L) 10/18/2020 0344   CL 101 10/18/2020 0344   CO2 21 (L) 10/18/2020 0344   GLUCOSE 136 (H) 10/18/2020 0344   BUN 6 (L) 10/18/2020 0344   CREATININE 0.80 10/18/2020 0344   CREATININE 1.37 (H) 10/25/2016 1124   CALCIUM 7.1 (L) 10/18/2020 0344   PROT 6.1 (L) 10/18/2020 0344   ALBUMIN 3.0 (L) 10/18/2020 0344   AST 71 (H) 10/18/2020 0344   ALT 43 10/18/2020 0344   ALKPHOS 44 10/18/2020 0344   BILITOT 1.3 (H) 10/18/2020 0344   GFRNONAA >60 10/18/2020 0344   GFRAA >60 05/12/2020 1020   Ammonia elevated at 39  Imaging I have reviewed images in epic and the results pertinent to this consultation are: CT head with no acute changes MR brain motion degraded with no acute infarction hemorrhage or mass. MRA head motion degraded with no evidence of intracranial vessel occlusion or significant stenosis.  Assessment:  79 year old man presented as code stroke because of generalized tonic-clonic seizures at his facility.  Found to have severe electrolyte abnormalities-hypomagnesemia, hypokalemia and hypocalcemia. Continued to be confused and agitated. He is appearing much more oriented today. He has a history of colon cancer and basal cell cancer along with, cell cancers and we wanted to do a MRI of the brain with and without contrast but he did not tolerate the complete study.  MRI brain without contrast was unremarkable for any acute process. 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 At this point I do not think we need an inpatient  MRI with contrast but he would benefit from further outpatient work-up with a contrast-enhanced MRI brain study.  Impression: Provoked seizures in the setting of multiple I derangements History of cancer-rule out metastases  Recommendations: Supportive medical management per primary team as you are. Ammonia level mildly elevated-treatment per primary team Outpatient neurology follow-up with possible MR brain with contrast at that time. Inpatient neurology will be available as needed.  Please call with questions. Plan relayed to Dr Dwyane Dee over the phone  -- Amie Portland, MD [facility] Pager: [phone] If 7pm to 7am, please call on call as listed on AMION.

## 2020-10-19 DIAGNOSIS — E876 Hypokalemia: Secondary | ICD-10-CM | POA: Diagnosis not present

## 2020-10-19 LAB — COMPREHENSIVE METABOLIC PANEL
ALT: 36 U/L (ref 0–44)
AST: 40 U/L (ref 15–41)
Albumin: 2.6 g/dL — ABNORMAL LOW (ref 3.5–5.0)
Alkaline Phosphatase: 38 U/L (ref 38–126)
Anion gap: 9 (ref 5–15)
BUN: 11 mg/dL (ref 8–23)
CO2: 22 mmol/L (ref 22–32)
Calcium: 7 mg/dL — ABNORMAL LOW (ref 8.9–10.3)
Chloride: 99 mmol/L (ref 98–111)
Creatinine, Ser: 0.85 mg/dL (ref 0.61–1.24)
GFR, Estimated: >60 mL/min (ref >60)
Glucose, Bld: 142 mg/dL — ABNORMAL HIGH (ref 70–99)
Potassium: 3.7 mmol/L (ref 3.5–5.1)
Sodium: 130 mmol/L — ABNORMAL LOW (ref 135–145)
Total Bilirubin: 1 mg/dL (ref 0.3–1.2)
Total Protein: 6 g/dL — ABNORMAL LOW (ref 6.5–8.1)

## 2020-10-19 LAB — GLUCOSE, CAPILLARY
Glucose-Capillary: 139 mg/dL — ABNORMAL HIGH (ref 70–99)
Glucose-Capillary: 141 mg/dL — ABNORMAL HIGH (ref 70–99)
Glucose-Capillary: 148 mg/dL — ABNORMAL HIGH (ref 70–99)
Glucose-Capillary: 217 mg/dL — ABNORMAL HIGH (ref 70–99)

## 2020-10-19 LAB — MAGNESIUM: Magnesium: 1.6 mg/dL — ABNORMAL LOW (ref 1.7–2.4)

## 2020-10-19 LAB — PHOSPHORUS: Phosphorus: 1.9 mg/dL — ABNORMAL LOW (ref 2.5–4.6)

## 2020-10-19 MED ORDER — K PHOS MONO-SOD PHOS DI & MONO 155-852-130 MG PO TABS
250.0000 mg | ORAL_TABLET | Freq: Three times a day (TID) | ORAL | Status: DC
  Administered 2020-10-19 – 2020-10-20 (×4): 250 mg via ORAL
  Filled 2020-10-19 (×5): qty 1

## 2020-10-19 MED ORDER — MAGNESIUM SULFATE 2 GM/50ML IV SOLN
2.0000 g | Freq: Once | INTRAVENOUS | Status: AC
  Administered 2020-10-19: 2 g via INTRAVENOUS
  Filled 2020-10-19: qty 50

## 2020-10-19 NOTE — Progress Notes (Signed)
PROGRESS NOTE    Devin Garza  MLY:[id number] DOB: 1941/09/26 DOA: 10/15/2020 PCP: Isaac Bliss, Rayford Halsted, MD    Brief Narrative: 79 years old male with PMH of hypertension hyperlipidemia osteoarthritis upper GI bleeding in 2017 secondary to gastric and duodenal AVMs, depression, basal cell carcinoma presents in the emergency department after being found down in his home.  Patient is poor historian. Most of history is obtained from EMS and ED staff.  Patient brought to the emergency department as a stroke code and was immediately seen by Neurologist Dr. Curly Sh[place], CT head was negative for stroke. Patient found to have severe hypomagnesemia,  hypokalemia and hypocalcemia.  Patient had an episode of tonic-clonic activity with evidence of postictal state after an episode.  Patient was given Keppra.  Neurology recommended MRI and EEG.  EEG is negative for any seizures.  MRI: No evidence of acute infarction, hemorrhage, or mass. Mild chronic microvascular ischemic changes. Neurology signed off. Patient is more alert, awake. No recommendation of AED, given seizures were secondary to electrolyte abnormalities.   Assessment & Plan:   Principal Problem:   Seizure ([place]) Active Problems:   Mixed hyperlipidemia   Essential hypertension   Prediabetes   GERD without esophagitis   Hypokalemia   Hypomagnesemia   Hyperglycemia   Right hip pain   Acute urinary retention   History of alcohol abuse   Fall   Pressure injury of skin   Seizure [facility])   Patient initially presented to the emergency department after being found down in his place of residence.  Because of patient's fall was initially not clear but with the 1 minute episode of tonic-clonic seizure-like activity with notable postictal state witnessed in the emergency department it is likely that the initial episode that the patient experienced at home was also a seizure.  Etiology of seizures are possibly related to hypovolemia or severe  electrolyte disturbance( low ca, Low Mag, Low K)  Per chart review patient has a history of alcohol abuse and is still actively drinking at least 2 glasses of wine daily according to outpatient notes.  If patient is drinking more heavily an element of alcohol intoxication or withdrawal may be contributing to the seizure activity.  Chest x-ray, unremarkable, UA unremarkable.       EEG no evidence of seizures.  MRI : No evidence of acute infarction, hemorrhage, or mass. Mild chronic microvascular ischemic changes.  Patient received 1000 mg of Keppra in the ED,  Keppra discontinued as per Neuro. NO need for AED  Seizure precautions.  Monitoring patient on telemetry  As needed Ativan for repeat seizure activity.  Neurology signed off.  History of alcohol abuse   Patient has endorsed drinking at least 2 to 3 glasses of wine daily.  We will place patient on CIWA protocol with as needed benzodiazepines for withdrawal symptoms.  Placing patient on thiamine and folate supplementation.       Patient remained agitated and restless, more comfortable, oriented now       MRI : No evidence of acute infarction, hemorrhage, or mass. Mild chronic microvascular ischemic changes.  Acute urinary retention   Patient reporting lower abdominal discomfort with inability to urinate in the emergency room  UA shows hemoglobin and ketones otherwise negative for LE and nitrites.  Urine culture : No growth so far.  Considering vague presentation with malaise, fatigue and seizure, we will additionally obtain CT imaging of the abdomen and pelvis to evaluate for any evidence of insidious malignancy.  CT  abdomen pelvis negative for any malignancy.  No history of BPH although considering patient's age this could very well be the cause.    Essential hypertension     Continue home regimen of antihypertensives.    Prediabetes   Known history of prediabetes  Patient is hyperglycemic upon  arrival.  Accu-Cheks before every meal and nightly for now.    Hypokalemia   Patient exhibiting multiple electrolyte abnormalities including substantial hypokalemia and severe hypomagnesiamia  Replacement in progress, recheck labs in the morning.    Hypomagnesemia >> Improved.   Replacement given,  continues to remain hypomagnesemic.    Ordered magnesium sulfate 2 g IV again  Hypocalcemia   Replacing with intravenous calcium gluconate  Obtain repeat calcium level in the morning along with ionized calcium level    Right hip pain   Patient complaining of a 1 week history of right lower extremity swelling as well as right hip pain.   Hip x-ray performed here in the emergency department reveals no evidence of fracture.  On examination, patient has concurrent right lower extremity edema which the patient states is new.    Patient additionally has right hip pain that is reproducible with isolation of the right hip joint.   .   CT Hip joint No evidence of acute fracture or dislocation of the right hip.   right lower extremity ultrasound ruled out DVT.    GERD without esophagitis   Longstanding history of GERD with known history of gastric and duodenal AVMs  Transitioning patient from home regimen of Nexium to Protonix 40 mg twice daily    Mixed hyperlipidemia  Continue home regimen of statin therapy   DVT prophylaxis: SCDS Code Status: full Family Communication:  No one at bed side. Disposition Plan:   Status is: Inpatient  Remains inpatient appropriate because:Inpatient level of care appropriate due to severity of illness   Dispo: The patient is from: Home              Anticipated d/c is to: SNF              Anticipated d/c date is: 2 days              Patient currently is not medically stable to d/c.   Consultants:    Neurology  Procedures:  Antimicrobials:  Anti-infectives (From admission, onward)   None     Subjective: Patient was  seen and examined at bedside.  He appears more alert and oriented x 3 .  Overnight events noted. Patient wants to go home, states he lives by self and been able to manage things by self.   Objective: Vitals:   10/18/20 2024 10/18/20 2323 10/19/20 0336 10/19/20 0600  BP: (!) 149/82 137/68 130/74   Pulse: 91 80 80   Resp: 20 18 18    Temp: 100.2 F (37.9 C) 98.7 F (37.1 C) 98.9 F (37.2 C)   TempSrc: Oral Oral Oral   SpO2: 94% 94% 98%   Weight:  80.7 kg  80.7 kg  Height:        Intake/Output Summary (Last 24 hours) at 10/19/2020 1102 Last data filed at 10/19/2020 0200 Gross per 24 hour  Intake 1400 ml  Output 6 ml  Net 1394 ml   Filed Weights   10/16/20 1602 10/18/20 2323 10/19/20 0600  Weight: 78.9 kg 80.7 kg 80.7 kg    Examination:  General exam: Appears calm and comfortable.  Calm and oriented x 3 Respiratory system: Clear to  auscultation. Respiratory effort normal. Cardiovascular system: S1 & S2 heard, RRR. No JVD, murmurs, rubs, gallops or clicks. No pedal edema. Gastrointestinal system: Abdomen is nondistended, soft and nontender. No organomegaly or masses felt. Normal bowel sounds heard. Central nervous system: Alert and oriented. No focal neurological deficits. Extremities:  No edema, no cyanosis, no clubbing. Skin: No rashes, lesions or ulcers Psychiatry:  Mood & affect appropriate.     Data Reviewed: I have personally reviewed following labs and imaging studies  CBC: Recent Labs  Lab 10/15/20 1905 10/15/20 1905 10/15/20 1916 10/15/20 2211 10/16/20 0251 10/16/20 2249 10/18/20 0344  WBC 6.8  --   --   --  7.6 7.4 6.6  NEUTROABS 4.5  --   --   --  5.8  --   --   HGB 12.1*   < > 12.2* 12.2* 10.1* 10.9* 11.4*  HCT 36.1*   < > 36.0* 36.0* 29.7* 31.5* 33.3*  MCV 92.8  --   --   --  92.0 91.3 92.5  PLT 267  --   --   --  231 237 243   < > = values in this interval not displayed.   Basic Metabolic Panel: Recent Labs  Lab 10/16/20 1119 10/16/20 2249  10/17/20 0940 10/18/20 0344 10/19/20 0448  NA 138 136 138 135 130*  K 3.1* 3.6 3.4* 3.0* 3.7  CL 102 102 103 101 99  CO2 21* 21* 22 21* 22  GLUCOSE 110* 144* 140* 136* 142*  BUN 6* 7* 7* 6* 11  CREATININE 0.87 0.96 0.84 0.80 0.85  CALCIUM 7.1* 7.0* 6.9* 7.1* 7.0*  MG 0.8* 1.8 1.5* 2.5* 1.6*  PHOS 2.5 2.9 2.7 2.7 1.9*   GFR: Estimated Creatinine Clearance: 71.6 mL/min (by C-G formula based on SCr of 0.85 mg/dL). Liver Function Tests: Recent Labs  Lab 10/16/20 1119 10/16/20 2249 10/17/20 0940 10/18/20 0344 10/19/20 0448  AST 97* 106* 98* 71* 40  ALT 40 45* 47* 43 36  ALKPHOS 39 46 43 44 38  BILITOT 1.1 1.1 1.1 1.3* 1.0  PROT 5.7* 6.1* 6.1* 6.1* 6.0*  ALBUMIN 3.2* 3.3* 3.1* 3.0* 2.6*   No results for input(s): LIPASE, AMYLASE in the last 168 hours. Recent Labs  Lab 10/17/20 0940  AMMONIA 39*   Coagulation Profile: Recent Labs  Lab 10/15/20 1905  INR 1.2   Cardiac Enzymes: No results for input(s): CKTOTAL, CKMB, CKMBINDEX, TROPONINI in the last 168 hours. BNP (last 3 results) No results for input(s): PROBNP in the last 8760 hours. HbA1C: No results for input(s): HGBA1C in the last 72 hours. CBG: Recent Labs  Lab 10/18/20 0601 10/18/20 1132 10/18/20 1647 10/18/20 2049 10/19/20 0557  GLUCAP 150* 181* 168* 186* 139*   Lipid Profile: No results for input(s): CHOL, HDL, LDLCALC, TRIG, CHOLHDL, LDLDIRECT in the last 72 hours. Thyroid Function Tests: No results for input(s): TSH, T4TOTAL, FREET4, T3FREE, THYROIDAB in the last 72 hours. Anemia Panel: No results for input(s): VITAMINB12, FOLATE, FERRITIN, TIBC, IRON, RETICCTPCT in the last 72 hours. Sepsis Labs: Recent Labs  Lab 10/15/20 2307 10/16/20 0251  LATICACIDVEN 5.6* 1.3    Recent Results (from the past 240 hour(s))  Culture, Urine     Status: None   Collection Time: 10/15/20 12:18 AM   Specimen: Urine, Random  Result Value Ref Range Status   Specimen Description URINE, RANDOM  Final    Special Requests NONE  Final   Culture   Final    NO GROWTH Performed at  [facility], [place] [address] [place], [place] [address]    Report Status 10/17/2020 FINAL  Final  Respiratory Panel by RT PCR (Flu A&B, Covid) - Nasopharyngeal Swab     Status: None   Collection Time: 10/15/20  9:24 PM   Specimen: Nasopharyngeal Swab  Result Value Ref Range Status   SARS Coronavirus 2 by RT PCR NEGATIVE NEGATIVE Final    Comment: (NOTE) SARS-CoV-2 target nucleic acids are NOT DETECTED.  The SARS-CoV-2 RNA is generally detectable in upper respiratoy specimens during the acute phase of infection. The lowest concentration of SARS-CoV-2 viral copies this assay can detect is 131 copies/mL. A negative result does not preclude SARS-Cov-2 infection and should not be used as the sole basis for treatment or other patient management decisions. A negative result may occur with  improper specimen collection/handling, submission of specimen other than nasopharyngeal swab, presence of viral mutation(s) within the areas targeted by this assay, and inadequate number of viral copies (<131 copies/mL). A negative result must be combined with clinical observations, patient history, and epidemiological information. The expected result is Negative.  Fact Sheet for Patients:  PinkCheek.be  Fact Sheet for Healthcare Providers:  GravelBags.it  This test is no t yet approved or cleared by the [place] FDA and  has been authorized for detection and/or diagnosis of SARS-CoV-2 by FDA under an Emergency Use Authorization (EUA). This EUA will remain  in effect (meaning this test can be used) for the duration of the COVID-19 declaration under Section 564(b)(1) of the Act, 21 U.S.C. section 360bbb-3(b)(1), unless the authorization is terminated or revoked sooner.     Influenza A by PCR NEGATIVE NEGATIVE Final   Influenza B by PCR NEGATIVE NEGATIVE  Final    Comment: (NOTE) The Xpert Xpress SARS-CoV-2/FLU/RSV assay is intended as an aid in  the diagnosis of influenza from Nasopharyngeal swab specimens and  should not be used as a sole basis for treatment. Nasal washings and  aspirates are unacceptable for Xpert Xpress SARS-CoV-2/FLU/RSV  testing.  Fact Sheet for Patients: PinkCheek.be  Fact Sheet for Healthcare Providers: GravelBags.it  This test is not yet approved or cleared by the [place] FDA and  has been authorized for detection and/or diagnosis of SARS-CoV-2 by  FDA under an Emergency Use Authorization (EUA). This EUA will remain  in effect (meaning this test can be used) for the duration of the  Covid-19 declaration under Section 564(b)(1) of the Act, 21  U.S.C. section 360bbb-3(b)(1), unless the authorization is  terminated or revoked. Performed at [facility], [place] [address], [place], [place] [address]      Radiology Studies: MR ANGIO HEAD WO CONTRAST  Result Date: 10/17/2020 CLINICAL DATA:  Altered mental status, seizure EXAM: MRI HEAD WITHOUT CONTRAST MRA HEAD WITHOUT CONTRAST TECHNIQUE: Multiplanar, multiecho pulse sequences of the brain and surrounding structures were obtained without intravenous contrast. Angiographic images of the head were obtained using MRA technique without contrast. COMPARISON:  None. FINDINGS: Motion artifact is present. Below findings are within this limitation. MRI HEAD Brain: There is no acute infarction or intracranial hemorrhage. There is no intracranial mass, mass effect, or edema. There is no hydrocephalus or extra-axial fluid collection. Prominence of the ventricles and sulci reflects generalized parenchymal volume loss. Patchy T2 hyperintensity in the supratentorial white matter is nonspecific but may reflect mild chronic microvascular ischemic changes. Vascular: Major vessel flow voids at the skull base are  preserved. Skull and upper cervical spine: Normal marrow signal is  within normal limits. Sinuses/Orbits: Mild mucosal thick. Orbits are grossly unremarkable. Other: Sella is unremarkable.  Mastoid air cells are clear. MRA HEAD Intracranial internal carotid arteries are patent. Middle and anterior cerebral arteries are patent. Intracranial vertebral arteries, basilar artery, posterior cerebral arteries are patent. There is no significant stenosis or aneurysm. IMPRESSION: Motion degraded study. No evidence of acute infarction, hemorrhage, or mass. Mild chronic microvascular ischemic changes. No proximal intracranial vessel occlusion or significant stenosis. Electronically Signed   By: Macy Mis M.D.   On: 10/17/2020 15:53   MR BRAIN WO CONTRAST  Result Date: 10/17/2020 CLINICAL DATA:  Altered mental status, seizure EXAM: MRI HEAD WITHOUT CONTRAST MRA HEAD WITHOUT CONTRAST TECHNIQUE: Multiplanar, multiecho pulse sequences of the brain and surrounding structures were obtained without intravenous contrast. Angiographic images of the head were obtained using MRA technique without contrast. COMPARISON:  None. FINDINGS: Motion artifact is present. Below findings are within this limitation. MRI HEAD Brain: There is no acute infarction or intracranial hemorrhage. There is no intracranial mass, mass effect, or edema. There is no hydrocephalus or extra-axial fluid collection. Prominence of the ventricles and sulci reflects generalized parenchymal volume loss. Patchy T2 hyperintensity in the supratentorial white matter is nonspecific but may reflect mild chronic microvascular ischemic changes. Vascular: Major vessel flow voids at the skull base are preserved. Skull and upper cervical spine: Normal marrow signal is within normal limits. Sinuses/Orbits: Mild mucosal thick. Orbits are grossly unremarkable. Other: Sella is unremarkable.  Mastoid air cells are clear. MRA HEAD Intracranial internal carotid arteries are  patent. Middle and anterior cerebral arteries are patent. Intracranial vertebral arteries, basilar artery, posterior cerebral arteries are patent. There is no significant stenosis or aneurysm. IMPRESSION: Motion degraded study. No evidence of acute infarction, hemorrhage, or mass. Mild chronic microvascular ischemic changes. No proximal intracranial vessel occlusion or significant stenosis. Electronically Signed   By: Macy Mis M.D.   On: 10/17/2020 15:53    Scheduled Meds: . atorvastatin  10 mg Oral Daily  . calcium carbonate  200 mg of elemental calcium Oral BID  . enoxaparin (LOVENOX) injection  40 mg Subcutaneous Q24H  . folic acid  1 mg Oral Daily  . insulin aspart  0-9 Units Subcutaneous TID AC & HS  . irbesartan  300 mg Oral Daily  . metoprolol succinate  50 mg Oral Daily  . multivitamin with minerals  1 tablet Oral Daily  . pantoprazole  40 mg Oral BID  . phosphorus  250 mg Oral TID  . thiamine  100 mg Oral Daily   Or  . thiamine  100 mg Intravenous Daily   Continuous Infusions:    LOS: 3 days    Time spent: 25 mins.    Shawna Clamp, MD [facility]   If 7PM-7AM, please contact night-coverage

## 2020-10-20 DIAGNOSIS — E876 Hypokalemia: Secondary | ICD-10-CM | POA: Diagnosis not present

## 2020-10-20 LAB — MAGNESIUM: Magnesium: 1.5 mg/dL — ABNORMAL LOW (ref 1.7–2.4)

## 2020-10-20 LAB — BASIC METABOLIC PANEL
Anion gap: 9 (ref 5–15)
BUN: 12 mg/dL (ref 8–23)
CO2: 22 mmol/L (ref 22–32)
Calcium: 7.2 mg/dL — ABNORMAL LOW (ref 8.9–10.3)
Chloride: 99 mmol/L (ref 98–111)
Creatinine, Ser: 0.8 mg/dL (ref 0.61–1.24)
GFR, Estimated: >60 mL/min (ref >60)
Glucose, Bld: 181 mg/dL — ABNORMAL HIGH (ref 70–99)
Potassium: 3.1 mmol/L — ABNORMAL LOW (ref 3.5–5.1)
Sodium: 130 mmol/L — ABNORMAL LOW (ref 135–145)

## 2020-10-20 LAB — GLUCOSE, CAPILLARY
Glucose-Capillary: 129 mg/dL — ABNORMAL HIGH (ref 70–99)
Glucose-Capillary: 179 mg/dL — ABNORMAL HIGH (ref 70–99)
Glucose-Capillary: 115 mg/dL — ABNORMAL HIGH (ref 70–99)
Glucose-Capillary: 129 mg/dL — ABNORMAL HIGH (ref 70–99)

## 2020-10-20 LAB — HEMOGLOBIN AND HEMATOCRIT, BLOOD
HCT: 27.2 % — ABNORMAL LOW (ref 39.0–52.0)
Hemoglobin: 9.4 g/dL — ABNORMAL LOW (ref 13.0–17.0)

## 2020-10-20 LAB — PHOSPHORUS: Phosphorus: 1.9 mg/dL — ABNORMAL LOW (ref 2.5–4.6)

## 2020-10-20 LAB — RESPIRATORY PANEL BY RT PCR (FLU A&B, COVID)
Influenza A by PCR: NEGATIVE
Influenza B by PCR: NEGATIVE
SARS Coronavirus 2 by RT PCR: NEGATIVE

## 2020-10-20 MED ORDER — K PHOS MONO-SOD PHOS DI & MONO 155-852-130 MG PO TABS
250.0000 mg | ORAL_TABLET | Freq: Three times a day (TID) | ORAL | Status: AC
  Administered 2020-10-20 – 2020-10-22 (×6): 250 mg via ORAL
  Filled 2020-10-20 (×6): qty 1

## 2020-10-20 MED ORDER — MAGNESIUM SULFATE 2 GM/50ML IV SOLN
2.0000 g | Freq: Once | INTRAVENOUS | Status: AC
  Administered 2020-10-20: 2 g via INTRAVENOUS
  Filled 2020-10-20: qty 50

## 2020-10-20 MED ORDER — POTASSIUM CHLORIDE 20 MEQ PO PACK
40.0000 meq | PACK | Freq: Once | ORAL | Status: AC
  Administered 2020-10-20: 40 meq via ORAL
  Filled 2020-10-20: qty 2

## 2020-10-20 NOTE — Progress Notes (Signed)
CSW emailed SNF offers to pt son, will follow up tomorrow on a choice.

## 2020-10-20 NOTE — Progress Notes (Signed)
PROGRESS NOTE    Devin Garza  NFA:[id number] DOB: 07-28-1941 DOA: 10/15/2020 PCP: Isaac Bliss, Rayford Halsted, MD    Brief Narrative: 79 years old male with PMH of hypertension hyperlipidemia osteoarthritis upper GI bleeding in 2017 secondary to gastric and duodenal AVMs, depression, basal cell carcinoma presents in the emergency department after being found down in his home.  Patient is poor historian. Most of history is obtained from EMS and ED staff.  Patient brought to the emergency department as a stroke code and was immediately seen by Neurologist Dr. Curly Shores, CT head was negative for stroke. Patient found to have severe hypomagnesemia,  hypokalemia and hypocalcemia.  Patient had an episode of tonic-clonic activity with evidence of postictal state after an episode.  Patient was given Keppra.  Neurology recommended MRI and EEG.  EEG is negative for any seizures.  MRI: No evidence of acute infarction, hemorrhage, or mass. Mild chronic microvascular ischemic changes. Neurology signed off. Patient is more alert, awake. No recommendation of AED, given seizures were secondary to electrolyte abnormalities.   Assessment & Plan:   Principal Problem:   Seizure (Standard) Active Problems:   Mixed hyperlipidemia   Essential hypertension   Prediabetes   GERD without esophagitis   Hypokalemia   Hypomagnesemia   Hyperglycemia   Right hip pain   Acute urinary retention   History of alcohol abuse   Fall   Pressure injury of skin   Seizure [facility])   Patient initially presented to the emergency department after being found down in his place of residence.  Because of patient's fall was initially not clear but with the 1 minute episode of tonic-clonic seizure-like activity with notable postictal state witnessed in the emergency department it is likely that the initial episode that the patient experienced at home was also a seizure.  Etiology of seizures are possibly related to hypovolemia or severe  electrolyte disturbance( low ca, Low Mag, Low K)  Per chart review patient has a history of alcohol abuse and is still actively drinking at least 2 glasses of wine daily according to outpatient notes.  If patient is drinking more heavily an element of alcohol intoxication or withdrawal may be contributing to the seizure activity.  Chest x-ray, unremarkable, UA unremarkable.       EEG no evidence of seizures.  MRI : No evidence of acute infarction, hemorrhage, or mass. Mild chronic microvascular ischemic changes.  Patient received 1000 mg of Keppra in the ED,  Keppra discontinued as per Neuro. NO need for AED  Seizure precautions.  Monitoring patient on telemetry  As needed Ativan for repeat seizure activity.  Neurology signed off.  History of alcohol abuse   Patient has endorsed drinking at least 2 to 3 glasses of wine daily.  We will place patient on CIWA protocol with as needed benzodiazepines for withdrawal symptoms.  Placing patient on thiamine and folate supplementation.       Patient remained agitated and restless, more comfortable, oriented now       MRI : No evidence of acute infarction, hemorrhage, or mass. Mild chronic microvascular ischemic changes.  Acute urinary retention   Patient reporting lower abdominal discomfort with inability to urinate in the emergency room  UA shows hemoglobin and ketones otherwise negative for LE and nitrites.  Urine culture : No growth so far.  Considering vague presentation with malaise, fatigue and seizure, we will additionally obtain CT imaging of the abdomen and pelvis to evaluate for any evidence of insidious malignancy.  CT  abdomen pelvis negative for any malignancy.  No history of BPH although considering patient's age this could very well be the cause.  Patient is able to urinate in urinal, denies further problems with urination.    Essential hypertension     Continue home regimen of antihypertensives.     Prediabetes   Known history of prediabetes.  Patient is hyperglycemic upon arrival.  Accu-Cheks before every meal and nightly for now.    Hypokalemia    Patient exhibiting multiple electrolyte abnormalities including substantial hypokalemia and severe hypomagnesiamia  Replacement in progress, recheck labs in the morning.    Hypomagnesemia >> Improved.   Replacement given,  continues to remain hypomagnesemic.    Ordered magnesium sulfate 2 g IV again  Hypocalcemia   Replacing with intravenous calcium gluconate  Obtain repeat calcium level in the morning along with ionized calcium level    Right hip pain   Patient complaining of a 1 week history of right lower extremity swelling as well as right hip pain.   Hip x-ray performed here in the emergency department reveals no evidence of fracture.  On examination, patient has concurrent right lower extremity edema which the patient states is new.    Patient additionally has right hip pain that is reproducible with isolation of the right hip joint.   .   CT Hip joint No evidence of acute fracture or dislocation of the right hip.   right lower extremity ultrasound ruled out DVT.    GERD without esophagitis   Longstanding history of GERD with known history of gastric and duodenal AVMs  Transitioning patient from home regimen of Nexium to Protonix 40 mg twice daily    Mixed hyperlipidemia  Continue home regimen of statin therapy   DVT prophylaxis: SCDS Code Status: full Family Communication:  No one at bed side. Disposition Plan:   Status is: Inpatient  Remains inpatient appropriate because:Inpatient level of care appropriate due to severity of illness   Dispo: The patient is from: Home              Anticipated d/c is to: SNF              Anticipated d/c date is:10/26              Patient currently is not medically stable to d/c.   Consultants:    Neurology  Procedures:  Antimicrobials:   Anti-infectives (From admission, onward)   None     Subjective: Patient was seen and examined at bedside.  He appears more alert and oriented x 3 .  Overnight events noted. Patient appears much calmer, cooperative, following commands, denies any pain, problems urination.  Objective: Vitals:   10/20/20 0542 10/20/20 0615 10/20/20 0831 10/20/20 1107  BP:  (!) 142/79 (!) 154/117 (!) 147/97  Pulse:  92 (!) 109 86  Resp:  18 20 17   Temp:  98.7 F (37.1 C) 98.5 F (36.9 C) 98.2 F (36.8 C)  TempSrc:  Oral Oral Oral  SpO2:  98% 97% 96%  Weight: 85.3 kg     Height:        Intake/Output Summary (Last 24 hours) at 10/20/2020 1419 Last data filed at 10/20/2020 0700 Gross per 24 hour  Intake 720 ml  Output 202 ml  Net 518 ml   Filed Weights   10/18/20 2323 10/19/20 0600 10/20/20 0542  Weight: 80.7 kg 80.7 kg 85.3 kg    Examination:  General exam: Appears calm and comfortable.  Calm and  oriented x 3.  Respiratory system: Clear to auscultation. Respiratory effort normal. Cardiovascular system: S1 & S2 heard, RRR. No JVD, murmurs, rubs, gallops or clicks. No pedal edema. Gastrointestinal system: Abdomen is nondistended, soft and nontender. No organomegaly or masses felt. Normal bowel sounds heard. Central nervous system: Alert and oriented. No focal neurological deficits. Extremities:  No edema, no cyanosis, no clubbing. Skin: No rashes, lesions or ulcers Psychiatry:  Mood & affect appropriate.     Data Reviewed: I have personally reviewed following labs and imaging studies  CBC: Recent Labs  Lab 10/15/20 1905 10/15/20 1916 10/15/20 2211 10/16/20 0251 10/16/20 2249 10/18/20 0344 10/20/20 1100  WBC 6.8  --   --  7.6 7.4 6.6  --   NEUTROABS 4.5  --   --  5.8  --   --   --   HGB 12.1*   < > 12.2* 10.1* 10.9* 11.4* 9.4*  HCT 36.1*   < > 36.0* 29.7* 31.5* 33.3* 27.2*  MCV 92.8  --   --  92.0 91.3 92.5  --   PLT 267  --   --  231 237 243  --    < > = values in this  interval not displayed.   Basic Metabolic Panel: Recent Labs  Lab 10/16/20 2249 10/17/20 0940 10/18/20 0344 10/19/20 0448 10/20/20 1100  NA 136 138 135 130* 130*  K 3.6 3.4* 3.0* 3.7 3.1*  CL 102 103 101 99 99  CO2 21* 22 21* 22 22  GLUCOSE 144* 140* 136* 142* 181*  BUN 7* 7* 6* 11 12  CREATININE 0.96 0.84 0.80 0.85 0.80  CALCIUM 7.0* 6.9* 7.1* 7.0* 7.2*  MG 1.8 1.5* 2.5* 1.6* 1.5*  PHOS 2.9 2.7 2.7 1.9* 1.9*   GFR: Estimated Creatinine Clearance: 76 mL/min (by C-G formula based on SCr of 0.8 mg/dL). Liver Function Tests: Recent Labs  Lab 10/16/20 1119 10/16/20 2249 10/17/20 0940 10/18/20 0344 10/19/20 0448  AST 97* 106* 98* 71* 40  ALT 40 45* 47* 43 36  ALKPHOS 39 46 43 44 38  BILITOT 1.1 1.1 1.1 1.3* 1.0  PROT 5.7* 6.1* 6.1* 6.1* 6.0*  ALBUMIN 3.2* 3.3* 3.1* 3.0* 2.6*   No results for input(s): LIPASE, AMYLASE in the last 168 hours. Recent Labs  Lab 10/17/20 0940  AMMONIA 39*   Coagulation Profile: Recent Labs  Lab 10/15/20 1905  INR 1.2   Cardiac Enzymes: No results for input(s): CKTOTAL, CKMB, CKMBINDEX, TROPONINI in the last 168 hours. BNP (last 3 results) No results for input(s): PROBNP in the last 8760 hours. HbA1C: No results for input(s): HGBA1C in the last 72 hours. CBG: Recent Labs  Lab 10/19/20 1148 10/19/20 1650 10/19/20 2040 10/20/20 0723 10/20/20 1107  GLUCAP 141* 148* 217* 129* 179*   Lipid Profile: No results for input(s): CHOL, HDL, LDLCALC, TRIG, CHOLHDL, LDLDIRECT in the last 72 hours. Thyroid Function Tests: No results for input(s): TSH, T4TOTAL, FREET4, T3FREE, THYROIDAB in the last 72 hours. Anemia Panel: No results for input(s): VITAMINB12, FOLATE, FERRITIN, TIBC, IRON, RETICCTPCT in the last 72 hours. Sepsis Labs: Recent Labs  Lab 10/15/20 2307 10/16/20 0251  LATICACIDVEN 5.6* 1.3    Recent Results (from the past 240 hour(s))  Culture, Urine     Status: None   Collection Time: 10/15/20 12:18 AM   Specimen:  Urine, Random  Result Value Ref Range Status   Specimen Description URINE, RANDOM  Final   Special Requests NONE  Final   Culture  Final    NO GROWTH Performed at [facility], [place] [address] [place], [place] [address]    Report Status 10/17/2020 FINAL  Final  Respiratory Panel by RT PCR (Flu A&B, Covid) - Nasopharyngeal Swab     Status: None   Collection Time: 10/15/20  9:24 PM   Specimen: Nasopharyngeal Swab  Result Value Ref Range Status   SARS Coronavirus 2 by RT PCR NEGATIVE NEGATIVE Final    Comment: (NOTE) SARS-CoV-2 target nucleic acids are NOT DETECTED.  The SARS-CoV-2 RNA is generally detectable in upper respiratoy specimens during the acute phase of infection. The lowest concentration of SARS-CoV-2 viral copies this assay can detect is 131 copies/mL. A negative result does not preclude SARS-Cov-2 infection and should not be used as the sole basis for treatment or other patient management decisions. A negative result may occur with  improper specimen collection/handling, submission of specimen other than nasopharyngeal swab, presence of viral mutation(s) within the areas targeted by this assay, and inadequate number of viral copies (<131 copies/mL). A negative result must be combined with clinical observations, patient history, and epidemiological information. The expected result is Negative.  Fact Sheet for Patients:  PinkCheek.be  Fact Sheet for Healthcare Providers:  GravelBags.it  This test is no t yet approved or cleared by the [place] FDA and  has been authorized for detection and/or diagnosis of SARS-CoV-2 by FDA under an Emergency Use Authorization (EUA). This EUA will remain  in effect (meaning this test can be used) for the duration of the COVID-19 declaration under Section 564(b)(1) of the Act, 21 U.S.C. section 360bbb-3(b)(1), unless the authorization is terminated or revoked  sooner.     Influenza A by PCR NEGATIVE NEGATIVE Final   Influenza B by PCR NEGATIVE NEGATIVE Final    Comment: (NOTE) The Xpert Xpress SARS-CoV-2/FLU/RSV assay is intended as an aid in  the diagnosis of influenza from Nasopharyngeal swab specimens and  should not be used as a sole basis for treatment. Nasal washings and  aspirates are unacceptable for Xpert Xpress SARS-CoV-2/FLU/RSV  testing.  Fact Sheet for Patients: PinkCheek.be  Fact Sheet for Healthcare Providers: GravelBags.it  This test is not yet approved or cleared by the [place] FDA and  has been authorized for detection and/or diagnosis of SARS-CoV-2 by  FDA under an Emergency Use Authorization (EUA). This EUA will remain  in effect (meaning this test can be used) for the duration of the  Covid-19 declaration under Section 564(b)(1) of the Act, 21  U.S.C. section 360bbb-3(b)(1), unless the authorization is  terminated or revoked. Performed at [facility], [place] [address] [place], [place] [address]      Radiology Studies: No results found.  Scheduled Meds: . atorvastatin  10 mg Oral Daily  . enoxaparin (LOVENOX) injection  40 mg Subcutaneous Q24H  . folic acid  1 mg Oral Daily  . insulin aspart  0-9 Units Subcutaneous TID AC & HS  . irbesartan  300 mg Oral Daily  . metoprolol succinate  50 mg Oral Daily  . multivitamin with minerals  1 tablet Oral Daily  . pantoprazole  40 mg Oral BID  . phosphorus  250 mg Oral TID  . thiamine  100 mg Oral Daily   Or  . thiamine  100 mg Intravenous Daily   Continuous Infusions:    LOS: 4 days    Time spent: 25 mins.    Shawna Clamp, MD [facility]   If 7PM-7AM, please contact  night-coverage

## 2020-10-20 NOTE — Progress Notes (Signed)
Patient confused found him wandering around his bed naked no clothes trying to figure out how to get back in bed. I had just been in with rehab to reposition him in the gerichair he had been oob approximately 45 mins. I put him back to bed he told me taking a nap went across the hall to opposing room help another patient looked up across the hall and patient was kneeling on floor trying to get lower side rail up he was confused did not know where he was he is delirious. So he was given haldol. His Ativan has been mildly effective today. He is unsteady walking and he tells me he can walk fine I ask him if that is so why did he not use his cane? He tells me guess I am a little confused.Bed in low position with call light in reach. PIV restarted due to patient pulled it off as well as his tele leads.No further changes noted.

## 2020-10-20 NOTE — Evaluation (Signed)
Occupational Therapy Evaluation Patient Details Name: Devin Garza MRN: [id number] DOB: 09-30-1941 Today's Date: 10/20/2020    History of Present Illness 79yo male arriving via EMS after being found down at home. Initially presented as a code CVA but this was ruled out by neuro. Imaging negative for R hip injury. Had tonic-clonic activity with clear post-ictal state in the ED. Admitted for possible seizure activity. PMH HLD, HTN, OA, ankle surgery   Clinical Impression   Pt was living alone, using a RW in his home and a cane outside. He receives one meal a day and housekeeping at his ILF. Pt drives. He presents with impaired cognition, generalized weakness and impaired standing balance. Pt requires min assist for OOB and set up to min assist for ADL. Recommending ST rehab in SNF upon discharge. Will follow acutely.    Follow Up Recommendations  SNF;Supervision/Assistance - 24 hour    Equipment Recommendations  Other (comment) (defer to next venue)    Recommendations for Other Services       Precautions / Restrictions Precautions Precautions: Fall      Mobility Bed Mobility Overal bed mobility: Needs Assistance Bed Mobility: Supine to Sit     Supine to sit: Supervision          Transfers Overall transfer level: Needs assistance Equipment used: Rolling walker (2 wheeled) Transfers: Sit to/from Stand Sit to Stand: Min guard         General transfer comment: min assist for safe use of RW, sitting before aligning with chair    Balance Overall balance assessment: Needs assistance;History of Falls   Sitting balance-Leahy Scale: Fair       Standing balance-Leahy Scale: Poor                             ADL either performed or assessed with clinical judgement   ADL Overall ADL's : Needs assistance/impaired Eating/Feeding: Independent   Grooming: Wash/dry hands;Wash/dry face;Sitting;Set up   Upper Body Bathing: Minimal assistance;Sitting   Lower  Body Bathing: Minimal assistance;Sit to/from stand   Upper Body Dressing : Minimal assistance;Sitting   Lower Body Dressing: Minimal assistance;Sitting/lateral leans   Toilet Transfer: Minimal assistance;Stand-pivot;BSC;RW   Toileting- Clothing Manipulation and Hygiene: Minimal assistance;Sit to/from stand               Vision Baseline Vision/History: Wears glasses       Perception     Praxis      Pertinent Vitals/Pain Pain Assessment: No/denies pain     Hand Dominance Right   Extremity/Trunk Assessment Upper Extremity Assessment Upper Extremity Assessment: Overall WFL for tasks assessed   Lower Extremity Assessment Lower Extremity Assessment: Defer to PT evaluation   Cervical / Trunk Assessment Cervical / Trunk Assessment: Kyphotic   Communication Communication Communication: No difficulties   Cognition Arousal/Alertness: Awake/alert Behavior During Therapy: WFL for tasks assessed/performed Overall Cognitive Status: Impaired/Different from baseline Area of Impairment: Memory;Safety/judgement;Orientation                 Orientation Level: Disoriented to;Time;Situation;Place   Memory: Decreased short-term memory   Safety/Judgement: Decreased awareness of deficits;Decreased awareness of safety         General Comments       Exercises     Shoulder Instructions      Home Living Family/patient expects to be discharged to:: Private residence Living Arrangements: Alone   Type of Home: [place]: One  level     Bathroom Shower/Tub: Occupational psychologist: Standard     Home Equipment: Environmental consultant - 2 wheels;Cane - single point   Additional Comments: lives in an ILF that provides one meal a day and housekeeping      Prior Functioning/Environment Level of Independence: Independent with assistive device(s)                 OT Problem List: Decreased strength;Decreased activity tolerance;Impaired balance  (sitting and/or standing);Decreased knowledge of use of DME or AE;Decreased cognition;Decreased safety awareness      OT Treatment/Interventions: Self-care/ADL training;DME and/or AE instruction;Therapeutic activities;Cognitive remediation/compensation;Patient/family education;Balance training    OT Goals(Current goals can be found in the care plan section) Acute Rehab OT Goals Patient Stated Goal: get out of here OT Goal Formulation: With patient Time For Goal Achievement: 11/03/20 Potential to Achieve Goals: Good ADL Goals Pt Will Perform Grooming: (P) standing Pt Will Perform Lower Body Bathing: (P) with supervision;sit to/from stand Pt Will Perform Lower Body Dressing: (P) with supervision;sit to/from stand Pt Will Transfer to Toilet: (P) with supervision;ambulating;bedside commode (over toilet) Pt Will Perform Toileting - Clothing Manipulation and hygiene: (P) with supervision;sit to/from stand Additional ADL Goal #1: (P) Pt will be oriented x 4 using environmental cues and memory strategies.  OT Frequency: Min 2X/week   Barriers to D/C: Decreased caregiver support          Co-evaluation              AM-PAC OT "6 Clicks" Daily Activity     Outcome Measure Help from another person eating meals?: None Help from another person taking care of personal grooming?: A Little Help from another person toileting, which includes using toliet, bedpan, or urinal?: A Little Help from another person bathing (including washing, rinsing, drying)?: A Little Help from another person to put on and taking off regular upper body clothing?: A Little Help from another person to put on and taking off regular lower body clothing?: A Little 6 Click Score: 19   End of Session Equipment Utilized During Treatment: Gait belt;Rolling walker  Activity Tolerance: Patient tolerated treatment well Patient left: in chair;with call bell/phone within reach;with chair alarm set;with nursing/sitter in  room  OT Visit Diagnosis: Unsteadiness on feet (R26.81);Other abnormalities of gait and mobility (R26.89);Muscle weakness (generalized) (M62.81);Other symptoms and signs involving cognitive function                Time: 1125-1155 OT Time Calculation (min): 30 min Charges:  OT General Charges $OT Visit: 1 Visit OT Evaluation $OT Eval Moderate Complexity: 1 Mod OT Treatments $Self Care/Home Management : 8-22 mins  Nestor Lewandowsky, OTR/L Acute Rehabilitation Services Pager: [phone] Office: [phone] Malka So 10/20/2020, 12:09 PM

## 2020-10-20 NOTE — Social Work (Signed)
CSW called and spoke with [name], son of pt, about MD's recommendation of SNF. [name] stated his father should be allowed to make the choice as he [name]) doesn't live here. [name] stated if MD recommended SNF for pt then he would trust MD's professional opinion. CSW spoke with pt about MD's recommendation of doing SNF, patient stated he had not spoken with a MD. Patient stated he would consider SNF, CSW gave pt offers, pt stated he needed more time to look over them.  3:15-CSW went back to check on pt and was told by RN that pt was confused and had been standing in the room naked, urinating on the floor. CSW didn't go into the room. CSW spoke with Anderson Malta at [facility], who expressed concerns about pt and wanted to make sure he was dc to a safe place. Anderson Malta stated that pt was in an ILF apartment and they didn't provide any type of PT/OT. Anderson Malta requested to be updated on pt's dc as he does live on their campus. CSW called pt's son [name] back, no answer, had to leave a vm. CSW will continue to follow.

## 2020-10-21 DIAGNOSIS — E876 Hypokalemia: Secondary | ICD-10-CM | POA: Diagnosis not present

## 2020-10-21 LAB — GLUCOSE, CAPILLARY
Glucose-Capillary: 116 mg/dL — ABNORMAL HIGH (ref 70–99)
Glucose-Capillary: 155 mg/dL — ABNORMAL HIGH (ref 70–99)
Glucose-Capillary: 134 mg/dL — ABNORMAL HIGH (ref 70–99)
Glucose-Capillary: 172 mg/dL — ABNORMAL HIGH (ref 70–99)

## 2020-10-21 LAB — BASIC METABOLIC PANEL
Anion gap: 11 (ref 5–15)
BUN: 8 mg/dL (ref 8–23)
CO2: 20 mmol/L — ABNORMAL LOW (ref 22–32)
Calcium: 7.3 mg/dL — ABNORMAL LOW (ref 8.9–10.3)
Chloride: 99 mmol/L (ref 98–111)
Creatinine, Ser: 0.83 mg/dL (ref 0.61–1.24)
GFR, Estimated: >60 mL/min (ref >60)
Glucose, Bld: 111 mg/dL — ABNORMAL HIGH (ref 70–99)
Potassium: 3.7 mmol/L (ref 3.5–5.1)
Sodium: 130 mmol/L — ABNORMAL LOW (ref 135–145)

## 2020-10-21 LAB — PHOSPHORUS: Phosphorus: 2.6 mg/dL (ref 2.5–4.6)

## 2020-10-21 LAB — HEMOGLOBIN AND HEMATOCRIT, BLOOD
HCT: 27.8 % — ABNORMAL LOW (ref 39.0–52.0)
Hemoglobin: 9.6 g/dL — ABNORMAL LOW (ref 13.0–17.0)

## 2020-10-21 LAB — MAGNESIUM: Magnesium: 1.6 mg/dL — ABNORMAL LOW (ref 1.7–2.4)

## 2020-10-21 MED ORDER — MAGNESIUM SULFATE 2 GM/50ML IV SOLN
2.0000 g | Freq: Once | INTRAVENOUS | Status: AC
  Administered 2020-10-21: 2 g via INTRAVENOUS
  Filled 2020-10-21: qty 50

## 2020-10-21 MED ORDER — INSULIN ASPART 100 UNIT/ML ~~LOC~~ SOLN
0.0000 [IU] | Freq: Three times a day (TID) | SUBCUTANEOUS | Status: DC
  Administered 2020-10-21 (×2): 2 [IU] via SUBCUTANEOUS
  Administered 2020-10-21 – 2020-10-22 (×2): 1 [IU] via SUBCUTANEOUS

## 2020-10-21 NOTE — Consult Note (Signed)
   [facility] Inpatient Consult   10/21/2020  Devin Garza Apr 22, 1941 [id number]   [place] Organization [ACO] Patient: Medicare NextGen  Patient screened for high risk score for unplanned readmission.   Review of patient's medical record reveals patient is currently being recommended for a skilled nursing facility for rehab.  Primary Care Provider is Isaac Bliss, Rayford Halsted, MD this provider is listed to provide the transition of care [TOC] for post hospital follow up.  Plan: If patient transitions to a [facility] facility, will have [facility] nurse alerted of transition/disposition.  For questions contact:   Natividad Brood, RN BSN [facility] Liaison  [phone] business mobile phone Toll free office [phone]  Fax number: [phone] [email or web address]

## 2020-10-21 NOTE — Progress Notes (Signed)
Patient's sister called for an update. Had questions about disposition. Would like a phone call from [place]. Her name is Brigido Mera and her phone number is [phone].

## 2020-10-21 NOTE — Plan of Care (Signed)
°  Problem: Education: Goal: Knowledge of condition and prescribed therapy will improve Outcome: Progressing   

## 2020-10-21 NOTE — Progress Notes (Signed)
PROGRESS NOTE    Devin Garza  YKD:[id number] DOB: [birth year] DOA: 10/15/2020 PCP: Isaac Bliss, Rayford Halsted, MD    Brief Narrative: 79 years old male with PMH of hypertension hyperlipidemia osteoarthritis upper GI bleeding in 2017 secondary to gastric and duodenal AVMs, depression, basal cell carcinoma presents in the emergency department after being found down in his home.  Patient is poor historian. Most of history is obtained from EMS and ED staff.  Patient brought to the emergency department as a stroke code and was immediately seen by Neurologist Dr. Curly Shores, CT head was negative for stroke. Patient found to have severe hypomagnesemia,  hypokalemia and hypocalcemia.  Patient had an episode of tonic-clonic activity with evidence of postictal state after an episode.  Patient was given Keppra.  Neurology recommended MRI and EEG.  EEG is negative for any seizures.  MRI: No evidence of acute infarction, hemorrhage, or mass. Mild chronic microvascular ischemic changes. Neurology signed off. Patient is more alert, awake. No recommendation of AED, given seizures were secondary to electrolyte abnormalities.   Assessment & Plan:   Principal Problem:   Seizure ([place]) Active Problems:   Mixed hyperlipidemia   Essential hypertension   Prediabetes   GERD without esophagitis   Hypokalemia   Hypomagnesemia   Hyperglycemia   Right hip pain   Acute urinary retention   History of alcohol abuse   Fall   Pressure injury of skin   Seizure [facility])   Patient initially presented to the emergency department after being found down in his place of residence.  Because of patient's fall was initially not clear but with the 1 minute episode of tonic-clonic seizure-like activity with notable postictal state witnessed in the emergency department it is likely that the initial episode that the patient experienced at home was also a seizure.  Etiology of seizures are possibly related to hypovolemia or severe  electrolyte disturbance( low ca, Low Mag, Low K)  Per chart review patient has a history of alcohol abuse and is still actively drinking at least 2 glasses of wine daily according to outpatient notes.  If patient is drinking more heavily an element of alcohol intoxication or withdrawal may be contributing to the seizure activity.  Chest x-ray, unremarkable, UA unremarkable.       EEG no evidence of seizures.  MRI : No evidence of acute infarction, hemorrhage, or mass. Mild chronic microvascular ischemic changes.  Patient received 1000 mg of Keppra in the ED,  Keppra discontinued as per Neuro. NO need for AED  Seizure precautions.  Monitoring patient on telemetry  As needed Ativan for repeat seizure activity.  Neurology signed off.  History of alcohol abuse   Patient has endorsed drinking at least 2 to 3 glasses of wine daily.  We will place patient on CIWA protocol with as needed benzodiazepines for withdrawal symptoms.  Placing patient on thiamine and folate supplementation.       Patient remained agitated and restless, more comfortable, oriented now       MRI : No evidence of acute infarction, hemorrhage, or mass. Mild chronic microvascular ischemic changes.       No signs of withdrawal noted.  Acute urinary retention >>> Resolved.   Patient reporting lower abdominal discomfort with inability to urinate in the emergency room  UA shows hemoglobin and ketones otherwise negative for LE and nitrites.  Urine culture : No growth so far.  Considering vague presentation with malaise, fatigue and seizure, we will additionally obtain CT imaging of the  abdomen and pelvis to evaluate for any evidence of insidious malignancy.  CT abdomen pelvis negative for any malignancy.  No history of BPH although considering patient's age this could very well be the cause.  Patient is able to urinate in urinal, denies further problems with urination.    Essential hypertension     Continue  home regimen of antihypertensives.    Prediabetes   Known history of prediabetes.  Patient is hyperglycemic upon arrival.  Accu-Cheks before every meal and nightly for now.    Hypokalemia  - Resolved   Patient exhibiting multiple electrolyte abnormalities including substantial hypokalemia and severe hypomagnesiamia  Replacement in progress, recheck labs in the morning.    Hypomagnesemia >> Improving   Replacement given,  continues to remain hypomagnesemic.    Ordered magnesium sulfate 2 g IV again  Hypocalcemia   Replacing with intravenous calcium gluconate  Obtain repeat calcium level in the morning along with ionized calcium level    Right hip pain   Patient complaining of a 1 week history of right lower extremity swelling as well as right hip pain.   Hip x-ray performed here in the emergency department reveals no evidence of fracture.  On examination, patient has concurrent right lower extremity edema which the patient states is new.    Patient additionally has right hip pain that is reproducible with isolation of the right hip joint.   .   CT Hip joint No evidence of acute fracture or dislocation of the right hip.   right lower extremity ultrasound ruled out DVT.    GERD without esophagitis   Longstanding history of GERD with known history of gastric and duodenal AVMs  Transitioning patient from home regimen of Nexium to Protonix 40 mg twice daily    Mixed hyperlipidemia  Continue home regimen of statin therapy   DVT prophylaxis: SCDS Code Status: full Family Communication:  No one at bed side. Disposition Plan:   Status is: Inpatient  Remains inpatient appropriate because:Inpatient level of care appropriate due to severity of illness   Dispo: The patient is from: Home              Anticipated d/c is to: SNF              Anticipated d/c date is:10/27              Patient currently is not medically stable to d/c.   Consultants:     Neurology  Procedures:  Antimicrobials:  Anti-infectives (From admission, onward)   None     Subjective: Patient was seen and examined at bedside.  Overnight events noted. He was sleeping when examined. Patient appears much calmer, cooperative, following commands, asks when will he be discharged.  Objective: Vitals:   10/21/20 0030 10/21/20 0430 10/21/20 0942 10/21/20 1120  BP: (!) 155/68 (!) 149/84 (!) 144/85 (!) 108/47  Pulse: 86 87 83 92  Resp:  20 17 18   Temp:  (!) 97.4 F (36.3 C)  98.1 F (36.7 C)  TempSrc:  Oral    SpO2: 100% 95% 95% 100%  Weight:  77.3 kg    Height:        Intake/Output Summary (Last 24 hours) at 10/21/2020 1525 Last data filed at 10/21/2020 1246 Gross per 24 hour  Intake 1923 ml  Output 1100 ml  Net 823 ml   Filed Weights   10/19/20 0600 10/20/20 0542 10/21/20 0430  Weight: 80.7 kg 85.3 kg 77.3 kg    Examination:  General exam: Appears calm and comfortable.  Calm and oriented x 3.  Respiratory system: Clear to auscultation. Respiratory effort normal. Cardiovascular system: S1 & S2 heard, RRR. No JVD, murmurs, rubs, gallops or clicks. No pedal edema. Gastrointestinal system: Abdomen is nondistended, soft and nontender. No organomegaly or masses felt. Normal bowel sounds heard. Central nervous system: Alert and oriented. No focal neurological deficits. Extremities:  No edema, no cyanosis, no clubbing. Skin: No rashes, lesions or ulcers Psychiatry:  Mood & affect appropriate.     Data Reviewed: I have personally reviewed following labs and imaging studies  CBC: Recent Labs  Lab 10/15/20 1905 10/15/20 1916 10/16/20 0251 10/16/20 2249 10/18/20 0344 10/20/20 1100 10/21/20 0241  WBC 6.8  --  7.6 7.4 6.6  --   --   NEUTROABS 4.5  --  5.8  --   --   --   --   HGB 12.1*   < > 10.1* 10.9* 11.4* 9.4* 9.6*  HCT 36.1*   < > 29.7* 31.5* 33.3* 27.2* 27.8*  MCV 92.8  --  92.0 91.3 92.5  --   --   PLT 267  --  231 237 243  --   --     < > = values in this interval not displayed.   Basic Metabolic Panel: Recent Labs  Lab 10/17/20 0940 10/18/20 0344 10/19/20 0448 10/20/20 1100 10/21/20 0241  NA 138 135 130* 130* 130*  K 3.4* 3.0* 3.7 3.1* 3.7  CL 103 101 99 99 99  CO2 22 21* 22 22 20*  GLUCOSE 140* 136* 142* 181* 111*  BUN 7* 6* 11 12 8   CREATININE 0.84 0.80 0.85 0.80 0.83  CALCIUM 6.9* 7.1* 7.0* 7.2* 7.3*  MG 1.5* 2.5* 1.6* 1.5* 1.6*  PHOS 2.7 2.7 1.9* 1.9* 2.6   GFR: Estimated Creatinine Clearance: 73.3 mL/min (by C-G formula based on SCr of 0.83 mg/dL). Liver Function Tests: Recent Labs  Lab 10/16/20 1119 10/16/20 2249 10/17/20 0940 10/18/20 0344 10/19/20 0448  AST 97* 106* 98* 71* 40  ALT 40 45* 47* 43 36  ALKPHOS 39 46 43 44 38  BILITOT 1.1 1.1 1.1 1.3* 1.0  PROT 5.7* 6.1* 6.1* 6.1* 6.0*  ALBUMIN 3.2* 3.3* 3.1* 3.0* 2.6*   No results for input(s): LIPASE, AMYLASE in the last 168 hours. Recent Labs  Lab 10/17/20 0940  AMMONIA 39*   Coagulation Profile: Recent Labs  Lab 10/15/20 1905  INR 1.2   Cardiac Enzymes: No results for input(s): CKTOTAL, CKMB, CKMBINDEX, TROPONINI in the last 168 hours. BNP (last 3 results) No results for input(s): PROBNP in the last 8760 hours. HbA1C: No results for input(s): HGBA1C in the last 72 hours. CBG: Recent Labs  Lab 10/20/20 1107 10/20/20 1655 10/20/20 2249 10/21/20 0604 10/21/20 1120  GLUCAP 179* 115* 129* 116* 155*   Lipid Profile: No results for input(s): CHOL, HDL, LDLCALC, TRIG, CHOLHDL, LDLDIRECT in the last 72 hours. Thyroid Function Tests: No results for input(s): TSH, T4TOTAL, FREET4, T3FREE, THYROIDAB in the last 72 hours. Anemia Panel: No results for input(s): VITAMINB12, FOLATE, FERRITIN, TIBC, IRON, RETICCTPCT in the last 72 hours. Sepsis Labs: Recent Labs  Lab 10/15/20 2307 10/16/20 0251  LATICACIDVEN 5.6* 1.3    Recent Results (from the past 240 hour(s))  Culture, Urine     Status: None   Collection Time: 10/15/20  12:18 AM   Specimen: Urine, Random  Result Value Ref Range Status   Specimen Description URINE, RANDOM  Final   Special  Requests NONE  Final   Culture   Final    NO GROWTH Performed at [facility], [place] [address] [place], [place] [address]    Report Status 10/17/2020 FINAL  Final  Respiratory Panel by RT PCR (Flu A&B, Covid) - Nasopharyngeal Swab     Status: None   Collection Time: 10/15/20  9:24 PM   Specimen: Nasopharyngeal Swab  Result Value Ref Range Status   SARS Coronavirus 2 by RT PCR NEGATIVE NEGATIVE Final    Comment: (NOTE) SARS-CoV-2 target nucleic acids are NOT DETECTED.  The SARS-CoV-2 RNA is generally detectable in upper respiratoy specimens during the acute phase of infection. The lowest concentration of SARS-CoV-2 viral copies this assay can detect is 131 copies/mL. A negative result does not preclude SARS-Cov-2 infection and should not be used as the sole basis for treatment or other patient management decisions. A negative result may occur with  improper specimen collection/handling, submission of specimen other than nasopharyngeal swab, presence of viral mutation(s) within the areas targeted by this assay, and inadequate number of viral copies (<131 copies/mL). A negative result must be combined with clinical observations, patient history, and epidemiological information. The expected result is Negative.  Fact Sheet for Patients:  PinkCheek.be  Fact Sheet for Healthcare Providers:  GravelBags.it  This test is no t yet approved or cleared by the [place] FDA and  has been authorized for detection and/or diagnosis of SARS-CoV-2 by FDA under an Emergency Use Authorization (EUA). This EUA will remain  in effect (meaning this test can be used) for the duration of the COVID-19 declaration under Section 564(b)(1) of the Act, 21 U.S.C. section 360bbb-3(b)(1), unless the authorization is  terminated or revoked sooner.     Influenza A by PCR NEGATIVE NEGATIVE Final   Influenza B by PCR NEGATIVE NEGATIVE Final    Comment: (NOTE) The Xpert Xpress SARS-CoV-2/FLU/RSV assay is intended as an aid in  the diagnosis of influenza from Nasopharyngeal swab specimens and  should not be used as a sole basis for treatment. Nasal washings and  aspirates are unacceptable for Xpert Xpress SARS-CoV-2/FLU/RSV  testing.  Fact Sheet for Patients: PinkCheek.be  Fact Sheet for Healthcare Providers: GravelBags.it  This test is not yet approved or cleared by the [place] FDA and  has been authorized for detection and/or diagnosis of SARS-CoV-2 by  FDA under an Emergency Use Authorization (EUA). This EUA will remain  in effect (meaning this test can be used) for the duration of the  Covid-19 declaration under Section 564(b)(1) of the Act, 21  U.S.C. section 360bbb-3(b)(1), unless the authorization is  terminated or revoked. Performed at [facility], [place] [address], [place], [place] [address]   Respiratory Panel by RT PCR (Flu A&B, Covid) - Nasopharyngeal Swab     Status: None   Collection Time: 10/20/20  2:10 PM   Specimen: Nasopharyngeal Swab  Result Value Ref Range Status   SARS Coronavirus 2 by RT PCR NEGATIVE NEGATIVE Final    Comment: (NOTE) SARS-CoV-2 target nucleic acids are NOT DETECTED.  The SARS-CoV-2 RNA is generally detectable in upper respiratoy specimens during the acute phase of infection. The lowest concentration of SARS-CoV-2 viral copies this assay can detect is 131 copies/mL. A negative result does not preclude SARS-Cov-2 infection and should not be used as the sole basis for treatment or other patient management decisions. A negative result may occur with  improper specimen collection/handling, submission of specimen other than nasopharyngeal swab, presence of  viral mutation(s) within the areas  targeted by this assay, and inadequate number of viral copies (<131 copies/mL). A negative result must be combined with clinical observations, patient history, and epidemiological information. The expected result is Negative.  Fact Sheet for Patients:  PinkCheek.be  Fact Sheet for Healthcare Providers:  GravelBags.it  This test is no t yet approved or cleared by the [place] FDA and  has been authorized for detection and/or diagnosis of SARS-CoV-2 by FDA under an Emergency Use Authorization (EUA). This EUA will remain  in effect (meaning this test can be used) for the duration of the COVID-19 declaration under Section 564(b)(1) of the Act, 21 U.S.C. section 360bbb-3(b)(1), unless the authorization is terminated or revoked sooner.     Influenza A by PCR NEGATIVE NEGATIVE Final   Influenza B by PCR NEGATIVE NEGATIVE Final    Comment: (NOTE) The Xpert Xpress SARS-CoV-2/FLU/RSV assay is intended as an aid in  the diagnosis of influenza from Nasopharyngeal swab specimens and  should not be used as a sole basis for treatment. Nasal washings and  aspirates are unacceptable for Xpert Xpress SARS-CoV-2/FLU/RSV  testing.  Fact Sheet for Patients: PinkCheek.be  Fact Sheet for Healthcare Providers: GravelBags.it  This test is not yet approved or cleared by the [place] FDA and  has been authorized for detection and/or diagnosis of SARS-CoV-2 by  FDA under an Emergency Use Authorization (EUA). This EUA will remain  in effect (meaning this test can be used) for the duration of the  Covid-19 declaration under Section 564(b)(1) of the Act, 21  U.S.C. section 360bbb-3(b)(1), unless the authorization is  terminated or revoked. Performed at [facility], [place] [address] [place], [place] [address]      Radiology Studies: No results found.  Scheduled Meds: .  atorvastatin  10 mg Oral Daily  . enoxaparin (LOVENOX) injection  40 mg Subcutaneous Q24H  . folic acid  1 mg Oral Daily  . insulin aspart  0-9 Units Subcutaneous TID AC & HS  . irbesartan  300 mg Oral Daily  . metoprolol succinate  50 mg Oral Daily  . multivitamin with minerals  1 tablet Oral Daily  . pantoprazole  40 mg Oral BID  . phosphorus  250 mg Oral TID  . thiamine  100 mg Oral Daily   Or  . thiamine  100 mg Intravenous Daily   Continuous Infusions:    LOS: 5 days    Time spent: 25 mins.    Shawna Clamp, MD [facility]   If 7PM-7AM, please contact night-coverage

## 2020-10-21 NOTE — Progress Notes (Signed)
Physical Therapy Treatment Patient Details Name: Devin Garza MRN: [id number] DOB: [birth year] Today's Date: 10/21/2020    History of Present Illness 79yo male arriving via EMS after being found down at home. Initially presented as a code CVA but this was ruled out by neuro. Imaging negative for R hip injury. Had tonic-clonic activity with clear post-ictal state in the ED. Admitted for possible seizure activity. PMH HLD, HTN, OA, ankle surgery    PT Comments    Pt supine in bed on arrival this session.  Pt pleasant during therapy session and reports," It feels good to get up and move."  Pt tolerated session well.  He continues to require min assistance for gt training and functional mobility.  Pt would benefit from skilled rehab in a post acute setting to maximize functional gains before returning home.     Follow Up Recommendations  SNF;Supervision/Assistance - 24 hour     Equipment Recommendations  Rolling walker with 5" wheels    Recommendations for Other Services       Precautions / Restrictions Precautions Precautions: Fall Precaution Comments: intermittent agitation Restrictions Weight Bearing Restrictions: No    Mobility  Bed Mobility Overal bed mobility: Needs Assistance Bed Mobility: Supine to Sit;Sit to Supine     Supine to sit: Supervision Sit to supine: Supervision   General bed mobility comments: Pt responds well to simple commands and able to complete tasks.  Transfers Overall transfer level: Needs assistance Equipment used: Rolling walker (2 wheeled) Transfers: Sit to/from Stand Sit to Stand: Min assist         General transfer comment: Cues for hand placement to and from seated surface.  Multimodal cues needed.  Cues for forward weight shifting.  Noted to brace against seated surface and required assistance to weight shift forward.  Ambulation/Gait Ambulation/Gait assistance: Min assist Gait Distance (Feet): 120 Feet Assistive device: Rolling  walker (2 wheeled) Gait Pattern/deviations: Step-through pattern;Decreased step length - right;Decreased step length - left;Decreased stride length;Decreased dorsiflexion - right;Decreased dorsiflexion - left;Trunk flexed;Drifts right/left Gait velocity: decreased   General Gait Details: Pt drifting to the R with poor obstacle negotiation.  Pt required assistance to turn and back to seated surface safely.   Stairs             Wheelchair Mobility    Modified Rankin (Stroke Patients Only)       Balance Overall balance assessment: Needs assistance;History of Falls Sitting-balance support: Feet supported Sitting balance-Leahy Scale: Fair       Standing balance-Leahy Scale: Poor                              Cognition Arousal/Alertness: Awake/alert Behavior During Therapy: WFL for tasks assessed/performed Overall Cognitive Status: Impaired/Different from baseline Area of Impairment: Following commands;Safety/judgement;Problem solving                       Following Commands: Follows one step commands inconsistently;Follows one step commands with increased time Safety/Judgement: Decreased awareness of deficits;Decreased awareness of safety   Problem Solving: Slow processing;Decreased initiation;Difficulty sequencing;Requires verbal cues;Requires tactile cues General Comments: Pt with confusion,  He is upset that he cannot get up without assistance.  Re-educated on his fall risk status.      Exercises      General Comments        Pertinent Vitals/Pain Pain Assessment: No/denies pain Faces Pain Scale: Hurts a little bit Pain Location: generalized discomfort  with mobiilty Pain Descriptors / Indicators: Aching;Discomfort Pain Intervention(s): Monitored during session;Repositioned    Home Living                      Prior Function            PT Goals (current goals can now be found in the care plan section) Acute Rehab PT  Goals Patient Stated Goal: get out of here Potential to Achieve Goals: Fair Progress towards PT goals: Progressing toward goals    Frequency    Min 2X/week      PT Plan Current plan remains appropriate    Co-evaluation              AM-PAC PT "6 Clicks" Mobility   Outcome Measure  Help needed turning from your back to your side while in a flat bed without using bedrails?: None Help needed moving from lying on your back to sitting on the side of a flat bed without using bedrails?: None Help needed moving to and from a bed to a chair (including a wheelchair)?: A Little Help needed standing up from a chair using your arms (e.g., wheelchair or bedside chair)?: A Little Help needed to walk in hospital room?: A Little Help needed climbing 3-5 steps with a railing? : A Little 6 Click Score: 20    End of Session Equipment Utilized During Treatment: Gait belt Activity Tolerance: Patient tolerated treatment well Patient left: with call bell/phone within reach;with bed alarm set;in bed Nurse Communication: Mobility status PT Visit Diagnosis: Unsteadiness on feet (R26.81);Difficulty in walking, not elsewhere classified (R26.2);Muscle weakness (generalized) (M62.81);History of falling (Z91.81)     Time: 6803-2122 PT Time Calculation (min) (ACUTE ONLY): 28 min  Charges:  $Gait Training: 8-22 mins $Therapeutic Activity: 8-22 mins                     Erasmo Leventhal , PTA Acute Rehabilitation Services Pager [phone] Office [phone]     Gerado Nabers Eli Hose 10/21/2020, 4:35 PM

## 2020-10-21 NOTE — NC FL2 (Signed)
[place] LEVEL OF CARE SCREENING TOOL     IDENTIFICATION  Patient Name: Devin Garza Birthdate: 01-19-1941 Sex: male Admission Date (Current Location): 10/15/2020  [facility] and [place] Number:  Herbalist and Address:  [facility], [place] [address] [place], [place] [address]      Provider Number: [id number]  Attending Physician Name and Address:  Shawna Clamp, MD  Relative Name and Phone Number:  Quandre Polinski [phone]    Current Level of Care: Hospital Recommended Level of Care: [place] Prior Approval Number:    Date Approved/Denied:   PASRR Number: [id number]  Discharge Plan: SNF    Current Diagnoses: Patient Active Problem List   Diagnosis Date Noted  . Fall 10/16/2020  . Pressure injury of skin 10/16/2020  . Seizure ([place]) 10/15/2020  . Hypokalemia 10/15/2020  . Hypomagnesemia 10/15/2020  . Hyperglycemia 10/15/2020  . Right hip pain 10/15/2020  . Acute urinary retention 10/15/2020  . History of alcohol abuse 10/15/2020  . Gout 03/21/2017  . History of upper gastrointestinal bleeding 11/10/2016  . Angiodysplasia of stomach and duodenum without hemorrhage   . Benign neoplasm of transverse colon   . Benign neoplasm of descending colon   . GERD without esophagitis 05/28/2015  . Tubular adenoma of colon 05/28/2015  . Prediabetes 04/21/2011  . Alcohol abuse 01/21/2011  . Anemia, unspecified 11/12/2009  . Basal cell carcinoma of skin 09/03/2008  . Carotid artery stenosis 08/28/2008  . Insomnia 08/28/2008  . Mixed hyperlipidemia 08/27/2008  . Essential hypertension 08/27/2008    Orientation RESPIRATION BLADDER Height & Weight     Self  Normal Incontinent, External catheter Weight: 170 lb 8 oz (77.3 kg) (scale A) Height:  5\' 10"  (177.8 cm)  BEHAVIORAL SYMPTOMS/MOOD NEUROLOGICAL BOWEL NUTRITION STATUS      Continent Diet (See dc summary)  AMBULATORY STATUS COMMUNICATION OF NEEDS Skin    Extensive Assist Verbally Normal                       Personal Care Assistance Level of Assistance  Bathing, Feeding, Dressing Bathing Assistance: Limited assistance Feeding assistance: Independent Dressing Assistance: Limited assistance     Functional Limitations Info  Sight, Hearing, Speech Sight Info: Impaired Hearing Info: Adequate Speech Info: Adequate    SPECIAL CARE FACTORS FREQUENCY  OT (By licensed OT), PT (By licensed PT)     PT Frequency: 5x week OT Frequency: 5x week            Contractures Contractures Info: Not present    Additional Factors Info  Code Status, Allergies, Psychotropic, Insulin Sliding Scale Code Status Info: [place] Allergies Info: NKA Psychotropic Info: haloperidol lactate (HALDOL) injection 2 mg every 6 hours as needed Insulin Sliding Scale Info: insulin aspart (novoLOG) injection 0-9 Units 3X daily before meals,       Current Medications (10/21/2020):  This is the current hospital active medication list Current Facility-Administered Medications  Medication Dose Route Frequency Provider Last Rate Last Admin  . acetaminophen (TYLENOL) tablet 650 mg  650 mg Oral Q6H PRN Vernelle Emerald, MD   650 mg at 10/21/20 1641   Or  . acetaminophen (TYLENOL) suppository 650 mg  650 mg Rectal Q6H PRN Vernelle Emerald, MD      . atorvastatin (LIPITOR) tablet 10 mg  10 mg Oral Daily Shalhoub, Sherryll Burger, MD   10 mg at 10/21/20 0945  . barrier cream (non-specified) 1 application  1 application  Topical BID PRN Shawna Clamp, MD      . enoxaparin (LOVENOX) injection 40 mg  40 mg Subcutaneous Q24H Shalhoub, Sherryll Burger, MD   40 mg at 10/20/20 2253  . folic acid (FOLVITE) tablet 1 mg  1 mg Oral Daily Shalhoub, Sherryll Burger, MD   1 mg at 10/21/20 0945  . haloperidol lactate (HALDOL) injection 2 mg  2 mg Intravenous Q6H PRN Opyd, Ilene Qua, MD   2 mg at 10/21/20 0213  . insulin aspart (novoLOG) injection 0-9 Units  0-9 Units Subcutaneous TID AC & HS Shawna Clamp, MD   1 Units at 10/21/20 1642  . irbesartan (AVAPRO) tablet 300 mg  300 mg Oral Daily Shalhoub, Sherryll Burger, MD   300 mg at 10/21/20 0945  . LORazepam (ATIVAN) tablet 1-4 mg  1-4 mg Oral Q1H PRN Opyd, Ilene Qua, MD   2 mg at 10/20/20 2320   Or  . LORazepam (ATIVAN) injection 1-4 mg  1-4 mg Intravenous Q1H PRN Opyd, Ilene Qua, MD   1 mg at 10/21/20 0958  . metoprolol succinate (TOPROL-XL) 24 hr tablet 50 mg  50 mg Oral Daily Shalhoub, Sherryll Burger, MD   50 mg at 10/21/20 0945  . multivitamin with minerals tablet 1 tablet  1 tablet Oral Daily Shalhoub, Sherryll Burger, MD   1 tablet at 10/21/20 0944  . ondansetron (ZOFRAN) tablet 4 mg  4 mg Oral Q6H PRN Shalhoub, Sherryll Burger, MD       Or  . ondansetron [facility]) injection 4 mg  4 mg Intravenous Q6H PRN Shalhoub, Sherryll Burger, MD      . pantoprazole (PROTONIX) EC tablet 40 mg  40 mg Oral BID Vernelle Emerald, MD   40 mg at 10/21/20 0945  . phosphorus (K PHOS NEUTRAL) tablet 250 mg  250 mg Oral TID Shawna Clamp, MD   250 mg at 10/21/20 1641  . polyethylene glycol (MIRALAX / GLYCOLAX) packet 17 g  17 g Oral Daily PRN Shalhoub, Sherryll Burger, MD      . thiamine tablet 100 mg  100 mg Oral Daily Shalhoub, Sherryll Burger, MD   100 mg at 10/21/20 0945   Or  . thiamine (B-1) injection 100 mg  100 mg Intravenous Daily Shalhoub, Sherryll Burger, MD   100 mg at 10/20/20 2956     Discharge Medications: Please see discharge summary for a list of discharge medications.  Relevant Imaging Results:  Relevant Lab Results:   Additional Information SSN [id number]  Loreta Ave, LCSWA

## 2020-10-22 DIAGNOSIS — C4401 Basal cell carcinoma of skin of lip: Secondary | ICD-10-CM | POA: Diagnosis not present

## 2020-10-22 DIAGNOSIS — I10 Essential (primary) hypertension: Secondary | ICD-10-CM | POA: Diagnosis not present

## 2020-10-22 DIAGNOSIS — M25551 Pain in right hip: Secondary | ICD-10-CM | POA: Diagnosis not present

## 2020-10-22 DIAGNOSIS — I69828 Other speech and language deficits following other cerebrovascular disease: Secondary | ICD-10-CM | POA: Diagnosis not present

## 2020-10-22 DIAGNOSIS — F5101 Primary insomnia: Secondary | ICD-10-CM | POA: Diagnosis not present

## 2020-10-22 DIAGNOSIS — R41841 Cognitive communication deficit: Secondary | ICD-10-CM | POA: Diagnosis not present

## 2020-10-22 DIAGNOSIS — R7303 Prediabetes: Secondary | ICD-10-CM | POA: Diagnosis not present

## 2020-10-22 DIAGNOSIS — Z8719 Personal history of other diseases of the digestive system: Secondary | ICD-10-CM | POA: Diagnosis not present

## 2020-10-22 DIAGNOSIS — R531 Weakness: Secondary | ICD-10-CM | POA: Diagnosis not present

## 2020-10-22 DIAGNOSIS — Z7401 Bed confinement status: Secondary | ICD-10-CM | POA: Diagnosis not present

## 2020-10-22 DIAGNOSIS — M2550 Pain in unspecified joint: Secondary | ICD-10-CM | POA: Diagnosis not present

## 2020-10-22 DIAGNOSIS — D649 Anemia, unspecified: Secondary | ICD-10-CM | POA: Diagnosis not present

## 2020-10-22 DIAGNOSIS — E871 Hypo-osmolality and hyponatremia: Secondary | ICD-10-CM | POA: Diagnosis not present

## 2020-10-22 DIAGNOSIS — J069 Acute upper respiratory infection, unspecified: Secondary | ICD-10-CM | POA: Diagnosis not present

## 2020-10-22 DIAGNOSIS — F339 Major depressive disorder, recurrent, unspecified: Secondary | ICD-10-CM | POA: Diagnosis not present

## 2020-10-22 DIAGNOSIS — F1011 Alcohol abuse, in remission: Secondary | ICD-10-CM | POA: Diagnosis not present

## 2020-10-22 DIAGNOSIS — Z9181 History of falling: Secondary | ICD-10-CM | POA: Diagnosis not present

## 2020-10-22 DIAGNOSIS — R1312 Dysphagia, oropharyngeal phase: Secondary | ICD-10-CM | POA: Diagnosis not present

## 2020-10-22 DIAGNOSIS — K219 Gastro-esophageal reflux disease without esophagitis: Secondary | ICD-10-CM | POA: Diagnosis not present

## 2020-10-22 DIAGNOSIS — R2681 Unsteadiness on feet: Secondary | ICD-10-CM | POA: Diagnosis not present

## 2020-10-22 DIAGNOSIS — E782 Mixed hyperlipidemia: Secondary | ICD-10-CM | POA: Diagnosis not present

## 2020-10-22 DIAGNOSIS — R339 Retention of urine, unspecified: Secondary | ICD-10-CM | POA: Diagnosis not present

## 2020-10-22 DIAGNOSIS — E876 Hypokalemia: Secondary | ICD-10-CM | POA: Diagnosis not present

## 2020-10-22 DIAGNOSIS — M6281 Muscle weakness (generalized): Secondary | ICD-10-CM | POA: Diagnosis not present

## 2020-10-22 DIAGNOSIS — R569 Unspecified convulsions: Secondary | ICD-10-CM | POA: Diagnosis not present

## 2020-10-22 DIAGNOSIS — I739 Peripheral vascular disease, unspecified: Secondary | ICD-10-CM | POA: Diagnosis not present

## 2020-10-22 LAB — BASIC METABOLIC PANEL
Anion gap: 9 (ref 5–15)
BUN: 8 mg/dL (ref 8–23)
CO2: 22 mmol/L (ref 22–32)
Calcium: 8 mg/dL — ABNORMAL LOW (ref 8.9–10.3)
Chloride: 98 mmol/L (ref 98–111)
Creatinine, Ser: 0.72 mg/dL (ref 0.61–1.24)
GFR, Estimated: >60 mL/min (ref >60)
Glucose, Bld: 111 mg/dL — ABNORMAL HIGH (ref 70–99)
Potassium: 3.3 mmol/L — ABNORMAL LOW (ref 3.5–5.1)
Sodium: 129 mmol/L — ABNORMAL LOW (ref 135–145)

## 2020-10-22 LAB — PHOSPHORUS: Phosphorus: 3 mg/dL (ref 2.5–4.6)

## 2020-10-22 LAB — GLUCOSE, CAPILLARY
Glucose-Capillary: 134 mg/dL — ABNORMAL HIGH (ref 70–99)
Glucose-Capillary: 143 mg/dL — ABNORMAL HIGH (ref 70–99)

## 2020-10-22 LAB — HEMOGLOBIN AND HEMATOCRIT, BLOOD
HCT: 27.7 % — ABNORMAL LOW (ref 39.0–52.0)
Hemoglobin: 9.8 g/dL — ABNORMAL LOW (ref 13.0–17.0)

## 2020-10-22 LAB — MAGNESIUM: Magnesium: 1.8 mg/dL (ref 1.7–2.4)

## 2020-10-22 MED ORDER — THIAMINE HCL 100 MG PO TABS
100.0000 mg | ORAL_TABLET | Freq: Every day | ORAL | 0 refills | Status: DC
Start: 2020-10-23 — End: 2020-10-24

## 2020-10-22 MED ORDER — SODIUM CHLORIDE 1 G PO TABS
1.0000 g | ORAL_TABLET | Freq: Three times a day (TID) | ORAL | 0 refills | Status: DC
Start: 2020-10-22 — End: 2020-10-24

## 2020-10-22 MED ORDER — POTASSIUM CHLORIDE CRYS ER 20 MEQ PO TBCR
40.0000 meq | EXTENDED_RELEASE_TABLET | Freq: Once | ORAL | Status: AC
  Administered 2020-10-22: 40 meq via ORAL
  Filled 2020-10-22: qty 2

## 2020-10-22 MED ORDER — LORAZEPAM 1 MG PO TABS
1.0000 mg | ORAL_TABLET | Freq: Once | ORAL | Status: AC
  Administered 2020-10-22: 1 mg via ORAL
  Filled 2020-10-22: qty 1

## 2020-10-22 MED ORDER — SODIUM CHLORIDE 1 G PO TABS
1.0000 g | ORAL_TABLET | Freq: Three times a day (TID) | ORAL | Status: DC
  Administered 2020-10-22: 1 g via ORAL
  Filled 2020-10-22 (×2): qty 1

## 2020-10-22 MED ORDER — DIPHENHYDRAMINE-ZINC ACETATE 2-0.1 % EX CREA
TOPICAL_CREAM | Freq: Every day | CUTANEOUS | Status: DC | PRN
  Filled 2020-10-22 (×2): qty 28

## 2020-10-22 NOTE — Plan of Care (Signed)
  Problem: Education: Goal: Knowledge of condition and prescribed therapy will improve Outcome: Adequate for Discharge   Problem: Cardiac: Goal: Will achieve and/or maintain adequate cardiac output Outcome: Adequate for Discharge   Problem: Physical Regulation: Goal: Complications related to the disease process, condition or treatment will be avoided or minimized Outcome: Adequate for Discharge   Problem: Education: Goal: Knowledge of General Education information will improve Description: Including pain rating scale, medication(s)/side effects and non-pharmacologic comfort measures Outcome: Adequate for Discharge   Problem: Health Behavior/Discharge Planning: Goal: Ability to manage health-related needs will improve Outcome: Adequate for Discharge   Problem: Clinical Measurements: Goal: Ability to maintain clinical measurements within normal limits will improve Outcome: Adequate for Discharge Goal: Will remain free from infection Outcome: Adequate for Discharge Goal: Diagnostic test results will improve Outcome: Adequate for Discharge Goal: Respiratory complications will improve Outcome: Adequate for Discharge Goal: Cardiovascular complication will be avoided Outcome: Adequate for Discharge   Problem: Activity: Goal: Risk for activity intolerance will decrease Outcome: Adequate for Discharge   Problem: Nutrition: Goal: Adequate nutrition will be maintained Outcome: Adequate for Discharge   Problem: Coping: Goal: Level of anxiety will decrease Outcome: Adequate for Discharge   Problem: Elimination: Goal: Will not experience complications related to bowel motility Outcome: Adequate for Discharge Goal: Will not experience complications related to urinary retention Outcome: Adequate for Discharge   Problem: Pain Managment: Goal: General experience of comfort will improve Outcome: Adequate for Discharge   Problem: Safety: Goal: Ability to remain free from injury  will improve Outcome: Adequate for Discharge   Problem: Skin Integrity: Goal: Risk for impaired skin integrity will decrease Outcome: Adequate for Discharge   Problem: Education: Goal: Expressions of having a comfortable level of knowledge regarding the disease process will increase Outcome: Adequate for Discharge   Problem: Coping: Goal: Ability to adjust to condition or change in health will improve Outcome: Adequate for Discharge Goal: Ability to identify appropriate support needs will improve Outcome: Adequate for Discharge   Problem: Health Behavior/Discharge Planning: Goal: Compliance with prescribed medication regimen will improve Outcome: Adequate for Discharge   Problem: Medication: Goal: Risk for medication side effects will decrease Outcome: Adequate for Discharge   Problem: Clinical Measurements: Goal: Complications related to the disease process, condition or treatment will be avoided or minimized Outcome: Adequate for Discharge Goal: Diagnostic test results will improve Outcome: Adequate for Discharge   Problem: Safety: Goal: Verbalization of understanding the information provided will improve Outcome: Adequate for Discharge   Problem: Self-Concept: Goal: Level of anxiety will decrease Outcome: Adequate for Discharge Goal: Ability to verbalize feelings about condition will improve Outcome: Adequate for Discharge

## 2020-10-22 NOTE — Progress Notes (Signed)
D/C instructions printed and placed in packet at nurse's station. IV removed, tolerated well. 

## 2020-10-22 NOTE — Discharge Summary (Signed)
Physician Discharge Summary  Devin Garza ZOX:[id number] DOB: 1941-02-23 DOA: 10/15/2020  PCP: Isaac Bliss, Rayford Halsted, MD  Admit date: 10/15/2020   Discharge date: 10/22/2020  Admitted From: Assisted Living facility.  Disposition:  [place].  Recommendations for Outpatient Follow-up:  1. Follow up with PCP in 1-2 weeks. 2. Please obtain BMP/CBC in one week. 3. Advised to follow-up with Neurology and to have a MRI with contrast for further evaluation. 4. Advised to take sodium chloride tablets twice daily for next 3 days.  Home Health: None. Equipment/Devices: None.  Discharge Condition: Stable CODE STATUS:Full code Diet recommendation: Heart Healthy   Brief Summary / Hospital course: This 79 years old male with PMH of hypertension, hyperlipidemia, osteoarthritis, upper GI bleeding in 2017 secondary to gastric and duodenal AVMs, depression, basal cell carcinoma presents in the emergency department after being found down in his home.  Patient is poor historian. Most of history is obtained from EMS and ED staff.  Patient was brought to the emergency department as a stroke code and was immediately seen by Neurologist Dr. Curly Shores, CT head was negative for stroke. Patient found to have severe hypomagnesemia, hypokalemia and hypocalcemia.  Patient had an episode of generalized  tonic-clonic activity with evidence of postictal state after an episode in the ED.  Patient was given Keppra.  Neurology recommended MRI and EEG.  EEG is negative for any seizures.  MRI: No evidence of acute infarction, hemorrhage, or mass. Mild chronic microvascular ischemic changes. Neurology signed off. Patient is more alert, awake. No recommendation of AED, given seizures were secondary to electrolyte abnormalities.  Patient had episodes of waxing and waning in mental status throughout the hospital course.  Neurology recommended outpatient neurology follow-up.  PT recommended skilled nursing facility.   Patient is being discharged to [facility] skilled nursing facility for rehab.  We will request nursing home physician to check BMP and patient was prescribed sodium chloride tablets twice a day for few days.  He was managed for below problems.  Discharge Diagnoses:  Principal Problem:   Seizure [facility]) Active Problems:   Mixed hyperlipidemia   Essential hypertension   Prediabetes   GERD without esophagitis   Hypokalemia   Hypomagnesemia   Hyperglycemia   Right hip pain   Acute urinary retention   History of alcohol abuse   Fall   Pressure injury of skin  Seizure [facility])   Patient initially presented to the emergency department after being found down in his place of residence. Because of patient's fall was initially not clear but with the 1 minute episode of tonic-clonic seizure-like activity with notable postictal state witnessed in the emergency department it is likely that the initial episode that the patient experienced at home was also a seizure.  Etiology of seizures are possibly related to hypovolemia or severe electrolyte disturbance( low ca, Low Mag, Low K)  Per chart review patient has a history of alcohol abuse and is still actively drinking at least 2 glasses of wine daily according to outpatient notes. If patient is drinking more heavily an element of alcohol intoxication or withdrawal may be contributing to the seizure activity.  Chest x-ray, unremarkable, UA unremarkable.       EEG no evidence of seizures.  MRI : No evidence of acute infarction, hemorrhage, or mass. Mild chronic microvascular ischemic changes.  Patient received 1000 mg of Keppra in the ED,  Keppra discontinued as per Neuro. NO need for AED  Seizure precautions.  Monitoring patient on telemetry  As needed Ativan for repeat seizure activity.  Neurology signed off.  History of alcohol abuse   Patient has endorsed drinking at least 2 to 3 glasses of wine daily.  We will place patient on  CIWA protocol with as needed benzodiazepines for withdrawal symptoms.  Placing patient on thiamine and folate supplementation.       Patient remained agitated and restless, more comfortable, oriented now       MRI : No evidence of acute infarction, hemorrhage, or mass. Mild chronic microvascular ischemic changes.       No signs of withdrawal noted.  Acute urinary retention >>> Resolved.   Patient reporting lower abdominal discomfort with inability to urinate in the emergency room  UA shows hemoglobin and ketones otherwise negative for LE and nitrites.  Urine culture : No growth so far.  Considering vague presentation with malaise, fatigue and seizure, we will additionally obtain CT imaging of the abdomen and pelvis to evaluate for any evidence of insidious malignancy.  CT abdomen pelvis negative for any malignancy.  No history of BPH although considering patient's age this could very well be the cause.  Patient is able to urinate in urinal, denies further problems with urination.  Essential hypertension     Continue home regimen of antihypertensives.  Prediabetes   Known history of prediabetes.  Patient is hyperglycemic upon arrival.  Accu-Cheks before every meal and nightly for now.  Hypokalemia  - Resolved   Patient exhibiting multiple electrolyte abnormalities including substantial hypokalemia and severe hypomagnesiamia  Replacement in progress, recheck labs in the morning.  Hypomagnesemia >> Improved   Replacement given,  continues to remain hypomagnesemic.    Ordered magnesium sulfate 2 g IV again  Hypocalcemia >> Improved   Replacing with intravenous calcium gluconate  Obtain repeat calcium level in the morning along with ionized calcium level  Right hip pain   Patient complaining of a 1 week history of right lower extremity swelling as well as right hip pain.   Hip x-ray performed here in the emergency department reveals no  evidence of fracture.  On examination, patient has concurrent right lower extremity edema which the patient states is new.   Patient additionally has right hip pain that is reproducible with isolation of the right hip joint.   .   CT Hip joint No evidence of acute fracture or dislocation of the right hip.   right lower extremity ultrasound ruled out DVT.  GERD without esophagitis   Longstanding history of GERD with known history of gastric and duodenal AVMs  Transitioning patient from home regimen of Nexium to Protonix 40 mg twice daily  Mixed hyperlipidemia  Continue home regimen of statin therapy  Discharge Instructions  Discharge Instructions    Call MD for:  difficulty breathing, headache or visual disturbances   Complete by: As directed    Call MD for:  persistant dizziness or light-headedness   Complete by: As directed    Call MD for:  persistant nausea and vomiting   Complete by: As directed    Diet - low sodium heart healthy   Complete by: As directed    Diet Carb Modified   Complete by: As directed    Discharge instructions   Complete by: As directed    Advised to follow-up with primary care physician in 1 week. Advised to follow-up with neurology and to have a MRI with contrast for further evaluation. Advised to take sodium chloride tablets twice daily for next 3 days.  Increase activity slowly   Complete by: As directed      Allergies as of 10/22/2020   No Known Allergies     Medication List    STOP taking these medications   Colchicine 0.6 MG Caps Commonly known as: Mitigare     TAKE these medications   atorvastatin 10 MG tablet Commonly known as: LIPITOR Take 1 tablet (10 mg total) by mouth daily.   buPROPion 150 MG 24 hr tablet Commonly known as: WELLBUTRIN XL Take 1 tablet (150 mg total) by mouth daily.   Diclofenac Sodium 1.5 % Soln Apply to small joints up to 4 times a day as needed What changed:   how to take this  when to  take this  additional instructions   Diovan HCT 320-12.5 MG tablet Generic drug: valsartan-hydrochlorothiazide Take 1 tablet by mouth daily.   eszopiclone 2 MG Tabs tablet Commonly known as: LUNESTA Take 1 tablet (2 mg total) by mouth at bedtime as needed for sleep. Take immediately before bedtime   loratadine 10 MG tablet Commonly known as: CLARITIN Take 10 mg by mouth daily as needed for allergies.   metoprolol succinate 50 MG 24 hr tablet Commonly known as: TOPROL-XL TAKE 1 TABLET BY MOUTH DAILY. TAKE WITH OR IMMEDIATELY FOLLOWING A MEAL. What changed: See the new instructions.   multivitamin tablet Take 1 tablet by mouth daily.   NexIUM 24HR 20 MG capsule Generic drug: esomeprazole Take 40 mg by mouth 2 (two) times daily before a meal.   sodium chloride 1 g tablet Take 1 tablet (1 g total) by mouth 3 (three) times daily with meals.   thiamine 100 MG tablet Take 1 tablet (100 mg total) by mouth daily. Start taking on: October 23, 2020       Contact information for follow-up providers    Isaac Bliss, Rayford Halsted, MD Follow up in 1 week(s).   Specialty: Internal Medicine Contact information: [place] [id number] [phone]        [facility] Follow up in 2 week(s).   Contact information: [place], [place] [phone]           Contact information for after-discharge care    Destination    [facility] Preferred SNF .   Service: Skilled Nursing Contact information: [address] [place] [phone]                 No Known Allergies  Consultations:  Neurology   Procedures/Studies: EEG  Result Date: 10/16/2020 Lora Havens, MD     10/16/2020  2:42 PM Patient Name: Devin Garza MRN: [id number] Epilepsy Attending: Lora Havens Referring Physician/Provider:  Dr Inda Merlin Date: 10/16/2020 Duration:  24.14 mins Patient history: 79 year old male who presented with generalized tonic-clonic seizure in the setting of profound electrolyte derangements.  EEG to evaluate for seizures. Level of alertness: Awake, asleep AEDs during EEG study: Ativan Technical aspects: This EEG study was done with scalp electrodes positioned according to the 10-20 International system of electrode placement. Electrical activity was acquired at a sampling rate of 500Hz  and reviewed with a high frequency filter of 70Hz  and a low frequency filter of 1Hz . EEG data were recorded continuously and digitally stored. Description: The posterior dominant rhythm consists of 9-10 Hz activity of moderate voltage (25-35 uV) seen predominantly in posterior head regions, symmetric and reactive to eye opening and eye closing. Sleep was characterized  by vertex waves, sleep spindles (12 to 14 Hz), maximal frontocentral region.  There is an excessive amount of 15 to 18 Hz  beta activity distributed symmetrically and diffusely.   Hyperventilation and photic stimulation were not performed.   ABNORMALITY -Excessive beta, generalized IMPRESSION: This study is within normal limits. No seizures or epileptiform discharges were seen throughout the recording. The excessive beta activity seen in the background is most likely due to the effect of benzodiazepine and is a benign EEG pattern. Lora Havens   DG Chest 1 View  Result Date: 10/15/2020 CLINICAL DATA:  Evaluate for pneumonia EXAM: CHEST  1 VIEW COMPARISON:  05/12/2020 FINDINGS: Cardiac shadow is mildly enlarged but stable. Aortic calcifications are seen. Lungs are well aerated bilaterally. No focal infiltrate or sizable effusion is seen. No bony abnormality is noted. IMPRESSION: No active disease. Electronically Signed   By: Inez Catalina M.D.   On: 10/15/2020 22:15   DG Pelvis 1-2 Views  Result Date: 10/15/2020 CLINICAL DATA:  Syncope EXAM: PELVIS - 1-2 VIEW COMPARISON:  None. FINDINGS: Mild  symmetric degenerative changes within the hips with joint space narrowing and spurring. SI joints symmetric and unremarkable. No acute bony abnormality. Specifically, no fracture, subluxation, or dislocation. Vascular calcifications. IMPRESSION: Mild degenerative changes in the hips.  No acute bony abnormality. Electronically Signed   By: Rolm Baptise M.D.   On: 10/15/2020 20:46   MR ANGIO HEAD WO CONTRAST  Result Date: 10/17/2020 CLINICAL DATA:  Altered mental status, seizure EXAM: MRI HEAD WITHOUT CONTRAST MRA HEAD WITHOUT CONTRAST TECHNIQUE: Multiplanar, multiecho pulse sequences of the brain and surrounding structures were obtained without intravenous contrast. Angiographic images of the head were obtained using MRA technique without contrast. COMPARISON:  None. FINDINGS: Motion artifact is present. Below findings are within this limitation. MRI HEAD Brain: There is no acute infarction or intracranial hemorrhage. There is no intracranial mass, mass effect, or edema. There is no hydrocephalus or extra-axial fluid collection. Prominence of the ventricles and sulci reflects generalized parenchymal volume loss. Patchy T2 hyperintensity in the supratentorial white matter is nonspecific but may reflect mild chronic microvascular ischemic changes. Vascular: Major vessel flow voids at the skull base are preserved. Skull and upper cervical spine: Normal marrow signal is within normal limits. Sinuses/Orbits: Mild mucosal thick. Orbits are grossly unremarkable. Other: Sella is unremarkable.  Mastoid air cells are clear. MRA HEAD Intracranial internal carotid arteries are patent. Middle and anterior cerebral arteries are patent. Intracranial vertebral arteries, basilar artery, posterior cerebral arteries are patent. There is no significant stenosis or aneurysm. IMPRESSION: Motion degraded study. No evidence of acute infarction, hemorrhage, or mass. Mild chronic microvascular ischemic changes. No proximal intracranial  vessel occlusion or significant stenosis. Electronically Signed   By: Macy Mis M.D.   On: 10/17/2020 15:53   MR BRAIN WO CONTRAST  Result Date: 10/17/2020 CLINICAL DATA:  Altered mental status, seizure EXAM: MRI HEAD WITHOUT CONTRAST MRA HEAD WITHOUT CONTRAST TECHNIQUE: Multiplanar, multiecho pulse sequences of the brain and surrounding structures were obtained without intravenous contrast. Angiographic images of the head were obtained using MRA technique without contrast. COMPARISON:  None. FINDINGS: Motion artifact is present. Below findings are within this limitation. MRI HEAD Brain: There is no acute infarction or intracranial hemorrhage. There is no intracranial mass, mass effect, or edema. There is no hydrocephalus or extra-axial fluid collection. Prominence of the ventricles and sulci reflects generalized parenchymal volume loss. Patchy T2 hyperintensity in the supratentorial white matter is nonspecific but may reflect mild  chronic microvascular ischemic changes. Vascular: Major vessel flow voids at the skull base are preserved. Skull and upper cervical spine: Normal marrow signal is within normal limits. Sinuses/Orbits: Mild mucosal thick. Orbits are grossly unremarkable. Other: Sella is unremarkable.  Mastoid air cells are clear. MRA HEAD Intracranial internal carotid arteries are patent. Middle and anterior cerebral arteries are patent. Intracranial vertebral arteries, basilar artery, posterior cerebral arteries are patent. There is no significant stenosis or aneurysm. IMPRESSION: Motion degraded study. No evidence of acute infarction, hemorrhage, or mass. Mild chronic microvascular ischemic changes. No proximal intracranial vessel occlusion or significant stenosis. Electronically Signed   By: Macy Mis M.D.   On: 10/17/2020 15:53   CT ABDOMEN PELVIS W CONTRAST  Result Date: 10/16/2020 CLINICAL DATA:  Cancer of unknown primary. Staging for malignancy in a patient with weakness, poor  appetite, and urinary retention. EXAM: CT ABDOMEN AND PELVIS WITH CONTRAST TECHNIQUE: Multidetector CT imaging of the abdomen and pelvis was performed using the standard protocol following bolus administration of intravenous contrast. CONTRAST:  145mL OMNIPAQUE IOHEXOL 300 MG/ML  SOLN COMPARISON:  CT right hip 10/15/2020 FINDINGS: Lower chest: Atelectasis in the lung bases. Cardiac enlargement. Coronary artery calcifications. Hepatobiliary: No focal liver abnormality is seen. No gallstones, gallbladder wall thickening, or biliary dilatation. Pancreas: Unremarkable. No pancreatic ductal dilatation or surrounding inflammatory changes. Spleen: Normal in size without focal abnormality. Adrenals/Urinary Tract: Adrenal glands are unremarkable. Kidneys are normal, without renal calculi, focal lesion, or hydronephrosis. Bladder is unremarkable. Stomach/Bowel: Stomach, small bowel, and colon are not abnormally distended. No wall thickening or inflammatory infiltration identified. Appendix is normal. Vascular/Lymphatic: Diffuse calcification of the aorta. Small aortic aneurysm measuring 3 cm AP dimension. No significant lymphadenopathy. Reproductive: Prostate is unremarkable. Other: No free air or free fluid in the abdomen. Abdominal wall musculature appears intact. Subcutaneous soft tissue gas in the anterior abdomen likely represents injection site. Musculoskeletal: Degenerative changes in the lumbar spine and hips. Degenerative cyst in the right acetabulum. Degenerative cyst versus bone cyst in the right proximal femur. No destructive bone lesions identified. IMPRESSION: 1. No evidence of primary or metastatic disease in the abdomen or pelvis. 2. Cardiac enlargement with coronary artery calcifications. 3. Small aortic aneurysm measuring 3 cm AP dimension. 4. Degenerative changes in the lumbar spine and hips. 5. Degenerative cyst versus bone cyst in the right proximal femur. Aortic Atherosclerosis (ICD10-I70.0).  Electronically Signed   By: Lucienne Capers M.D.   On: 10/16/2020 01:41   CT HIP RIGHT WO CONTRAST  Result Date: 10/16/2020 CLINICAL DATA:  Syncope. Patient found on floor. Evaluate for hip fracture. EXAM: CT OF THE RIGHT HIP WITHOUT CONTRAST TECHNIQUE: Multidetector CT imaging of the right hip was performed according to the standard protocol. Multiplanar CT image reconstructions were also generated. COMPARISON:  Right hip radiographs 10/14/2020 FINDINGS: Bones/Joint/Cartilage No evidence of acute fracture or dislocation of the right hip. Degenerative changes are present in the right hip. Subcortical cyst in the posterior acetabulum is likely degenerative. Prominent cyst with surrounding sclerosis demonstrated in the anterior aspect of the right femoral head may represent a degenerative cyst or a bone cyst. No expansile change, periosteal reaction, or bone destruction to suggest an aggressive lesion. No significant joint effusion. Ligaments Suboptimally assessed by CT. Muscles and Tendons Right hip musculature appears intact. No intramuscular mass, collection, or hematoma. Soft tissues Vascular calcifications in the external iliac and common femoral arteries. No soft tissue hematoma or collection. IMPRESSION: 1. No evidence of acute fracture or dislocation of the right  hip. 2. Degenerative changes in the right hip. 3. Prominent cyst with surrounding sclerosis in the anterior aspect of the right femoral head may represent a degenerative cyst or a bone cyst. Electronically Signed   By: Lucienne Capers M.D.   On: 10/16/2020 00:03   DG HIP UNILAT WITH PELVIS 2-3 VIEWS RIGHT  Result Date: 10/14/2020 CLINICAL DATA:  Right hip pain x2 weeks. EXAM: DG HIP (WITH OR WITHOUT PELVIS) 2-3V RIGHT COMPARISON:  None. FINDINGS: There is no evidence of hip fracture or dislocation. Moderate severity degenerative changes seen involving the bilateral hips. This is seen in the form of joint space narrowing and acetabular  sclerosis. Chronic curvilinear sclerosis and subcortical cysts are seen involving the right humeral head. Marked severity vascular calcification is noted. IMPRESSION: Moderate severity degenerative changes involving the bilateral hips. Electronically Signed   By: Virgina Norfolk M.D.   On: 10/14/2020 23:58   CT HEAD CODE STROKE WO CONTRAST  Result Date: 10/15/2020 CLINICAL DATA:  Code stroke. Initial evaluation for acute left-sided weakness. EXAM: CT HEAD WITHOUT CONTRAST TECHNIQUE: Contiguous axial images were obtained from the base of the skull through the vertex without intravenous contrast. COMPARISON:  None available. FINDINGS: Brain: Generalized age-related cerebral atrophy with mild-to-moderate chronic microvascular ischemic disease. No acute intracranial hemorrhage. No acute large vessel territory infarct. No mass lesion, midline shift or mass effect. No hydrocephalus or extra-axial fluid collection. Vascular: No hyperdense vessel. Calcified atherosclerosis present at skull base. Skull: Scalp soft tissues and calvarium within normal limits. Sinuses/Orbits: Globes and orbital soft tissues demonstrate no acute finding. Scattered mucosal thickening noted within the ethmoidal air cells and maxillary sinuses. Mastoid air cells are clear. Other: None. ASPECTS [facility] Stroke Program Early CT Score) - Ganglionic level infarction (caudate, lentiform nuclei, internal capsule, insula, M1-M3 cortex): 7 - Supraganglionic infarction (M4-M6 cortex): 3 Total score (0-10 with 10 being normal): 10 IMPRESSION: 1. No acute intracranial infarct or other abnormality. 2. ASPECTS is 10. 3. Age-related cerebral atrophy with mild-to-moderate chronic microvascular ischemic disease. These results were communicated to Dr. Curly Shores at 7:28 pmon 10/20/2021by text page via the [facility] messaging system. Electronically Signed   By: Jeannine Boga M.D.   On: 10/15/2020 19:30   VAS [place] LOWER EXTREMITY VENOUS (DVT)  Result Date:  10/16/2020  Lower Venous DVTStudy Indications: Pain, and Swelling.  Limitations: Poor ultrasound/tissue interface. Comparison Study: No prior studies. Performing Technologist: Darlin Coco  Examination Guidelines: A complete evaluation includes B-mode imaging, spectral Doppler, color Doppler, and power Doppler as needed of all accessible portions of each vessel. Bilateral testing is considered an integral part of a complete examination. Limited examinations for reoccurring indications may be performed as noted. The reflux portion of the exam is performed with the patient in reverse Trendelenburg.  +---------+---------------+---------+-----------+----------+--------------+ RIGHT    CompressibilityPhasicitySpontaneityPropertiesThrombus Aging +---------+---------------+---------+-----------+----------+--------------+ CFV      Full           Yes      Yes                                 +---------+---------------+---------+-----------+----------+--------------+ SFJ      Full                                                        +---------+---------------+---------+-----------+----------+--------------+  FV Prox  Full                                                        +---------+---------------+---------+-----------+----------+--------------+ FV Mid   Full                                                        +---------+---------------+---------+-----------+----------+--------------+ FV DistalFull                                                        +---------+---------------+---------+-----------+----------+--------------+ PFV      Full                                                        +---------+---------------+---------+-----------+----------+--------------+ POP      Full           Yes      Yes                                 +---------+---------------+---------+-----------+----------+--------------+ PTV      Full                                                         +---------+---------------+---------+-----------+----------+--------------+ PERO                                                  Not visualized +---------+---------------+---------+-----------+----------+--------------+   +----+---------------+---------+-----------+----------+--------------+ LEFTCompressibilityPhasicitySpontaneityPropertiesThrombus Aging +----+---------------+---------+-----------+----------+--------------+ CFV Full           Yes      Yes                                 +----+---------------+---------+-----------+----------+--------------+     Summary: RIGHT: - There is no evidence of deep vein thrombosis in the lower extremity.  - A complex cystic structure is found in the popliteal fossa.  LEFT: - No evidence of common femoral vein obstruction.  *See table(s) above for measurements and observations. Electronically signed by Servando Snare MD on 10/16/2020 at 4:11:42 PM.    Final     EEG , MRI Brain.   Subjective: Patient was seen and examined at bedside.  Overnight events noted.  Patient appears much better,  alert and oriented x 3.   He wants to be discharged today.  Discharge Exam: Vitals:   10/22/20 0300 10/22/20 1129  BP: (!) 166/85 (!) 142/80  Pulse: (!) 102 83  Resp: 18 18  Temp: 98.7 F (37.1 C) 98.2 F (36.8 C)  SpO2: 98% 98%   Vitals:   10/21/20 1120 10/21/20 1928 10/22/20 0300 10/22/20 1129  BP: (!) 108/47 (!) 147/73 (!) 166/85 (!) 142/80  Pulse: 92 (!) 106 (!) 102 83  Resp: 18 18 18 18   Temp: 98.1 F (36.7 C) 98 F (36.7 C) 98.7 F (37.1 C) 98.2 F (36.8 C)  TempSrc:   Oral Oral  SpO2: 100% 96% 98% 98%  Weight:   78.2 kg   Height:        General: Pt is alert, awake, not in acute distress Cardiovascular: RRR, S1/S2 +, no rubs, no gallops Respiratory: CTA bilaterally, no wheezing, no rhonchi Abdominal: Soft, NT, ND, bowel sounds + Extremities: no edema, no cyanosis    The results of significant  diagnostics from this hospitalization (including imaging, microbiology, ancillary and laboratory) are listed below for reference.     Microbiology: Recent Results (from the past 240 hour(s))  Culture, Urine     Status: None   Collection Time: 10/15/20 12:18 AM   Specimen: Urine, Random  Result Value Ref Range Status   Specimen Description URINE, RANDOM  Final   Special Requests NONE  Final   Culture   Final    NO GROWTH Performed at [facility], [address], [place], [place] [address]    Report Status 10/17/2020 FINAL  Final  Respiratory Panel by RT PCR (Flu A&B, Covid) - Nasopharyngeal Swab     Status: None   Collection Time: 10/15/20  9:24 PM   Specimen: Nasopharyngeal Swab  Result Value Ref Range Status   SARS Coronavirus 2 by RT PCR NEGATIVE NEGATIVE Final    Comment: (NOTE) SARS-CoV-2 target nucleic acids are NOT DETECTED.  The SARS-CoV-2 RNA is generally detectable in upper respiratoy specimens during the acute phase of infection. The lowest concentration of SARS-CoV-2 viral copies this assay can detect is 131 copies/mL. A negative result does not preclude SARS-Cov-2 infection and should not be used as the sole basis for treatment or other patient management decisions. A negative result may occur with  improper specimen collection/handling, submission of specimen other than nasopharyngeal swab, presence of viral mutation(s) within the areas targeted by this assay, and inadequate number of viral copies (<131 copies/mL). A negative result must be combined with clinical observations, patient history, and epidemiological information. The expected result is Negative.  Fact Sheet for Patients:  PinkCheek.be  Fact Sheet for Healthcare Providers:  GravelBags.it  This test is no t yet approved or cleared by the [place] FDA and  has been authorized for detection and/or diagnosis of SARS-CoV-2 by FDA  under an Emergency Use Authorization (EUA). This EUA will remain  in effect (meaning this test can be used) for the duration of the COVID-19 declaration under Section 564(b)(1) of the Act, 21 U.S.C. section 360bbb-3(b)(1), unless the authorization is terminated or revoked sooner.     Influenza A by PCR NEGATIVE NEGATIVE Final   Influenza B by PCR NEGATIVE NEGATIVE Final    Comment: (NOTE) The Xpert Xpress SARS-CoV-2/FLU/RSV assay is intended as an aid in  the diagnosis of influenza from Nasopharyngeal swab specimens and  should not be used as a sole basis for treatment. Nasal washings and  aspirates are unacceptable for Xpert Xpress SARS-CoV-2/FLU/RSV  testing.  Fact Sheet for Patients: PinkCheek.be  Fact Sheet for Healthcare Providers: GravelBags.it  This test is not yet approved or cleared by the [place]  FDA and  has been authorized for detection and/or diagnosis of SARS-CoV-2 by  FDA under an Emergency Use Authorization (EUA). This EUA will remain  in effect (meaning this test can be used) for the duration of the  Covid-19 declaration under Section 564(b)(1) of the Act, 21  U.S.C. section 360bbb-3(b)(1), unless the authorization is  terminated or revoked. Performed at [facility], [place] [address] [place], [place] [address]   Respiratory Panel by RT PCR (Flu A&B, Covid) - Nasopharyngeal Swab     Status: None   Collection Time: 10/20/20  2:10 PM   Specimen: Nasopharyngeal Swab  Result Value Ref Range Status   SARS Coronavirus 2 by RT PCR NEGATIVE NEGATIVE Final    Comment: (NOTE) SARS-CoV-2 target nucleic acids are NOT DETECTED.  The SARS-CoV-2 RNA is generally detectable in upper respiratoy specimens during the acute phase of infection. The lowest concentration of SARS-CoV-2 viral copies this assay can detect is 131 copies/mL. A negative result does not preclude SARS-Cov-2 infection and should not be  used as the sole basis for treatment or other patient management decisions. A negative result may occur with  improper specimen collection/handling, submission of specimen other than nasopharyngeal swab, presence of viral mutation(s) within the areas targeted by this assay, and inadequate number of viral copies (<131 copies/mL). A negative result must be combined with clinical observations, patient history, and epidemiological information. The expected result is Negative.  Fact Sheet for Patients:  PinkCheek.be  Fact Sheet for Healthcare Providers:  GravelBags.it  This test is no t yet approved or cleared by the [place] FDA and  has been authorized for detection and/or diagnosis of SARS-CoV-2 by FDA under an Emergency Use Authorization (EUA). This EUA will remain  in effect (meaning this test can be used) for the duration of the COVID-19 declaration under Section 564(b)(1) of the Act, 21 U.S.C. section 360bbb-3(b)(1), unless the authorization is terminated or revoked sooner.     Influenza A by PCR NEGATIVE NEGATIVE Final   Influenza B by PCR NEGATIVE NEGATIVE Final    Comment: (NOTE) The Xpert Xpress SARS-CoV-2/FLU/RSV assay is intended as an aid in  the diagnosis of influenza from Nasopharyngeal swab specimens and  should not be used as a sole basis for treatment. Nasal washings and  aspirates are unacceptable for Xpert Xpress SARS-CoV-2/FLU/RSV  testing.  Fact Sheet for Patients: PinkCheek.be  Fact Sheet for Healthcare Providers: GravelBags.it  This test is not yet approved or cleared by the [place] FDA and  has been authorized for detection and/or diagnosis of SARS-CoV-2 by  FDA under an Emergency Use Authorization (EUA). This EUA will remain  in effect (meaning this test can be used) for the duration of the  Covid-19 declaration under Section  564(b)(1) of the Act, 21  U.S.C. section 360bbb-3(b)(1), unless the authorization is  terminated or revoked. Performed at [facility], [place] [address], [place], [place] [address]      Labs: BNP (last 3 results) No results for input(s): BNP in the last 8760 hours. Basic Metabolic Panel: Recent Labs  Lab 10/18/20 0344 10/19/20 0448 10/20/20 1100 10/21/20 0241 10/22/20 0214  NA 135 130* 130* 130* 129*  K 3.0* 3.7 3.1* 3.7 3.3*  CL 101 99 99 99 98  CO2 21* 22 22 20* 22  GLUCOSE 136* 142* 181* 111* 111*  BUN 6* 11 12 8 8   CREATININE 0.80 0.85 0.80 0.83 0.72  CALCIUM 7.1* 7.0* 7.2* 7.3* 8.0*  MG 2.5*  1.6* 1.5* 1.6* 1.8  PHOS 2.7 1.9* 1.9* 2.6 3.0   Liver Function Tests: Recent Labs  Lab 10/16/20 1119 10/16/20 2249 10/17/20 0940 10/18/20 0344 10/19/20 0448  AST 97* 106* 98* 71* 40  ALT 40 45* 47* 43 36  ALKPHOS 39 46 43 44 38  BILITOT 1.1 1.1 1.1 1.3* 1.0  PROT 5.7* 6.1* 6.1* 6.1* 6.0*  ALBUMIN 3.2* 3.3* 3.1* 3.0* 2.6*   No results for input(s): LIPASE, AMYLASE in the last 168 hours. Recent Labs  Lab 10/17/20 0940  AMMONIA 39*   CBC: Recent Labs  Lab 10/15/20 1905 10/15/20 1916 10/16/20 0251 10/16/20 0251 10/16/20 2249 10/18/20 0344 10/20/20 1100 10/21/20 0241 10/22/20 0214  WBC 6.8  --  7.6  --  7.4 6.6  --   --   --   NEUTROABS 4.5  --  5.8  --   --   --   --   --   --   HGB 12.1*   < > 10.1*   < > 10.9* 11.4* 9.4* 9.6* 9.8*  HCT 36.1*   < > 29.7*   < > 31.5* 33.3* 27.2* 27.8* 27.7*  MCV 92.8  --  92.0  --  91.3 92.5  --   --   --   PLT 267  --  231  --  237 243  --   --   --    < > = values in this interval not displayed.   Cardiac Enzymes: No results for input(s): CKTOTAL, CKMB, CKMBINDEX, TROPONINI in the last 168 hours. BNP: Invalid input(s): POCBNP CBG: Recent Labs  Lab 10/21/20 1120 10/21/20 1629 10/21/20 2104 10/22/20 0528 10/22/20 1126  GLUCAP 155* 134* 172* 134* 143*   D-Dimer No results for input(s): DDIMER in the last  72 hours. Hgb A1c No results for input(s): HGBA1C in the last 72 hours. Lipid Profile No results for input(s): CHOL, HDL, LDLCALC, TRIG, CHOLHDL, LDLDIRECT in the last 72 hours. Thyroid function studies No results for input(s): TSH, T4TOTAL, T3FREE, THYROIDAB in the last 72 hours.  Invalid input(s): FREET3 Anemia work up No results for input(s): VITAMINB12, FOLATE, FERRITIN, TIBC, IRON, RETICCTPCT in the last 72 hours. Urinalysis    Component Value Date/Time   COLORURINE YELLOW 10/15/2020 2305   APPEARANCEUR CLEAR 10/15/2020 2305   LABSPEC 1.011 10/15/2020 2305   PHURINE 5.0 10/15/2020 2305   GLUCOSEU NEGATIVE 10/15/2020 2305   HGBUR SMALL (A) 10/15/2020 2305   BILIRUBINUR NEGATIVE 10/15/2020 2305   KETONESUR 20 (A) 10/15/2020 2305   PROTEINUR 30 (A) 10/15/2020 2305   NITRITE NEGATIVE 10/15/2020 2305   LEUKOCYTESUR NEGATIVE 10/15/2020 2305   Sepsis Labs Invalid input(s): PROCALCITONIN,  WBC,  LACTICIDVEN Microbiology Recent Results (from the past 240 hour(s))  Culture, Urine     Status: None   Collection Time: 10/15/20 12:18 AM   Specimen: Urine, Random  Result Value Ref Range Status   Specimen Description URINE, RANDOM  Final   Special Requests NONE  Final   Culture   Final    NO GROWTH Performed at [facility], [place] [address], [place], [place] [address]    Report Status 10/17/2020 FINAL  Final  Respiratory Panel by RT PCR (Flu A&B, Covid) - Nasopharyngeal Swab     Status: None   Collection Time: 10/15/20  9:24 PM   Specimen: Nasopharyngeal Swab  Result Value Ref Range Status   SARS Coronavirus 2 by RT PCR NEGATIVE NEGATIVE Final    Comment: (NOTE) SARS-CoV-2  target nucleic acids are NOT DETECTED.  The SARS-CoV-2 RNA is generally detectable in upper respiratoy specimens during the acute phase of infection. The lowest concentration of SARS-CoV-2 viral copies this assay can detect is 131 copies/mL. A negative result does not preclude SARS-Cov-2 infection and  should not be used as the sole basis for treatment or other patient management decisions. A negative result may occur with  improper specimen collection/handling, submission of specimen other than nasopharyngeal swab, presence of viral mutation(s) within the areas targeted by this assay, and inadequate number of viral copies (<131 copies/mL). A negative result must be combined with clinical observations, patient history, and epidemiological information. The expected result is Negative.  Fact Sheet for Patients:  PinkCheek.be  Fact Sheet for Healthcare Providers:  GravelBags.it  This test is no t yet approved or cleared by the [place] FDA and  has been authorized for detection and/or diagnosis of SARS-CoV-2 by FDA under an Emergency Use Authorization (EUA). This EUA will remain  in effect (meaning this test can be used) for the duration of the COVID-19 declaration under Section 564(b)(1) of the Act, 21 U.S.C. section 360bbb-3(b)(1), unless the authorization is terminated or revoked sooner.     Influenza A by PCR NEGATIVE NEGATIVE Final   Influenza B by PCR NEGATIVE NEGATIVE Final    Comment: (NOTE) The Xpert Xpress SARS-CoV-2/FLU/RSV assay is intended as an aid in  the diagnosis of influenza from Nasopharyngeal swab specimens and  should not be used as a sole basis for treatment. Nasal washings and  aspirates are unacceptable for Xpert Xpress SARS-CoV-2/FLU/RSV  testing.  Fact Sheet for Patients: PinkCheek.be  Fact Sheet for Healthcare Providers: GravelBags.it  This test is not yet approved or cleared by the [place] FDA and  has been authorized for detection and/or diagnosis of SARS-CoV-2 by  FDA under an Emergency Use Authorization (EUA). This EUA will remain  in effect (meaning this test can be used) for the duration of the  Covid-19 declaration  under Section 564(b)(1) of the Act, 21  U.S.C. section 360bbb-3(b)(1), unless the authorization is  terminated or revoked. Performed at [facility], [place] [address] [place], [place] [address]   Respiratory Panel by RT PCR (Flu A&B, Covid) - Nasopharyngeal Swab     Status: None   Collection Time: 10/20/20  2:10 PM   Specimen: Nasopharyngeal Swab  Result Value Ref Range Status   SARS Coronavirus 2 by RT PCR NEGATIVE NEGATIVE Final    Comment: (NOTE) SARS-CoV-2 target nucleic acids are NOT DETECTED.  The SARS-CoV-2 RNA is generally detectable in upper respiratoy specimens during the acute phase of infection. The lowest concentration of SARS-CoV-2 viral copies this assay can detect is 131 copies/mL. A negative result does not preclude SARS-Cov-2 infection and should not be used as the sole basis for treatment or other patient management decisions. A negative result may occur with  improper specimen collection/handling, submission of specimen other than nasopharyngeal swab, presence of viral mutation(s) within the areas targeted by this assay, and inadequate number of viral copies (<131 copies/mL). A negative result must be combined with clinical observations, patient history, and epidemiological information. The expected result is Negative.  Fact Sheet for Patients:  PinkCheek.be  Fact Sheet for Healthcare Providers:  GravelBags.it  This test is no t yet approved or cleared by the [place] FDA and  has been authorized for detection and/or diagnosis of SARS-CoV-2 by FDA under an Emergency Use Authorization (EUA). This EUA will remain  in effect (meaning this test can be used) for the duration of the COVID-19 declaration under Section 564(b)(1) of the Act, 21 U.S.C. section 360bbb-3(b)(1), unless the authorization is terminated or revoked sooner.     Influenza A by PCR NEGATIVE NEGATIVE Final   Influenza B by  PCR NEGATIVE NEGATIVE Final    Comment: (NOTE) The Xpert Xpress SARS-CoV-2/FLU/RSV assay is intended as an aid in  the diagnosis of influenza from Nasopharyngeal swab specimens and  should not be used as a sole basis for treatment. Nasal washings and  aspirates are unacceptable for Xpert Xpress SARS-CoV-2/FLU/RSV  testing.  Fact Sheet for Patients: PinkCheek.be  Fact Sheet for Healthcare Providers: GravelBags.it  This test is not yet approved or cleared by the [place] FDA and  has been authorized for detection and/or diagnosis of SARS-CoV-2 by  FDA under an Emergency Use Authorization (EUA). This EUA will remain  in effect (meaning this test can be used) for the duration of the  Covid-19 declaration under Section 564(b)(1) of the Act, 21  U.S.C. section 360bbb-3(b)(1), unless the authorization is  terminated or revoked. Performed at [facility], [place] [address], [place], [place] [address]      Time coordinating discharge: Over 30 minutes  SIGNED:   Shawna Clamp, MD  [facility] 10/22/2020, 12:05 PM Pager   If 7PM-7AM, please contact night-coverage [email or web address]

## 2020-10-22 NOTE — Care Management Important Message (Signed)
Important Message  Patient Details  Name: Devin Garza MRN: [id number] Date of Birth: [birth year]   Medicare Important Message Given:  Yes     Shelda Altes 10/22/2020, 9:50 AM

## 2020-10-22 NOTE — Progress Notes (Signed)
Repeatedly calling out for assistance for requests such as:  More water/ice, readjustment in bed, turn the light on/off, pain medication, assistance with bed adjustments, move covers up/down, and asking repeatedly about a prescribed cream.  Awaiting pharmacy delivery for cream.  Assisted with needs, redirected, asked if anything else was needed, assured patient staff would return within the hour.  Patient stated he "is fine."

## 2020-10-22 NOTE — TOC Initial Note (Signed)
Transition of Care [facility]) - Initial/Assessment Note    Patient Details  Name: Devin Garza MRN: [id number] Date of Birth: [birth year]  [facility] CM/SW Contact:    Loreta Ave, [place] Phone Number: 10/22/2020, 12:38 PM  Clinical Narrative:                 Patient will DC to: [facility] Anticipated DC date: 10/22/20 Family notified: Drema Pry Transport by: Corey Harold   Per MD patient ready for DC to [facility]. RN to call report prior to discharge. RN, patient, patient's family, and facility notified of DC. Discharge Summary and FL2 sent to facility. DC packet on chart. Ambulance transport requested for patient.   CSW will sign off for now as social work intervention is no longer needed. Please consult [place] again if new needs arise.    Expected Discharge Plan: Skilled Nursing Facility Barriers to Discharge: Barriers Resolved   Patient Goals and CMS Choice Patient states their goals for this hospitalization and ongoing recovery are:: Get back home CMS Medicare.gov Compare Post Acute Care list provided to:: Patient Represenative (must comment) (Son [name]) Choice offered to / list presented to : Adult Children  Expected Discharge Plan and Services Expected Discharge Plan: [place] In-house Referral: Clinical Social Work   Post Acute Care Choice: [place] Living arrangements for the past 2 months: [place] Expected Discharge Date: 10/22/20                                    Prior Living Arrangements/Services Living arrangements for the past 2 months: [place] Lives with:: Self Patient language and need for interpreter reviewed:: Yes Do you feel safe going back to the place where you live?: No   Need more care  Need for Family Participation in Patient Care: Yes (Comment) Care giver support system in place?: Yes (comment)   Criminal Activity/Legal Involvement Pertinent to Current  Situation/Hospitalization: No - Comment as needed  Activities of Daily Living      Permission Sought/Granted Permission sought to share information with : Family Supports, Customer service manager, Case Manager Permission granted to share information with : No (Pt has fluctuating orientation)  Share Information with NAME: Flynt Breeze  Permission granted to share info w AGENCY: SNF's  Permission granted to share info w Relationship: Son  Permission granted to share info w Contact Information: [phone]  Emotional Assessment Appearance:: Appears stated age Attitude/Demeanor/Rapport: Gracious Affect (typically observed): Calm Orientation: : Oriented to Self Alcohol / Substance Use: Not Applicable Psych Involvement: No (comment)  Admission diagnosis:  Hypokalemia [E87.6] Hypomagnesemia [E83.42] Pneumonia [J18.9] Fall [W19.XXXA] Syncope, unspecified syncope type [R55] Patient Active Problem List   Diagnosis Date Noted  . Fall 10/16/2020  . Pressure injury of skin 10/16/2020  . Seizure ([place]) 10/15/2020  . Hypokalemia 10/15/2020  . Hypomagnesemia 10/15/2020  . Hyperglycemia 10/15/2020  . Right hip pain 10/15/2020  . Acute urinary retention 10/15/2020  . History of alcohol abuse 10/15/2020  . Gout 03/21/2017  . History of upper gastrointestinal bleeding 11/10/2016  . Angiodysplasia of stomach and duodenum without hemorrhage   . Benign neoplasm of transverse colon   . Benign neoplasm of descending colon   . GERD without esophagitis 05/28/2015  . Tubular adenoma of colon 05/28/2015  . Prediabetes 04/21/2011  . Alcohol abuse 01/21/2011  . Anemia, unspecified 11/12/2009  . Basal cell carcinoma of skin  09/03/2008  . Carotid artery stenosis 08/28/2008  . Insomnia 08/28/2008  . Mixed hyperlipidemia 08/27/2008  . Essential hypertension 08/27/2008   PCP:  Isaac Bliss, Rayford [place], MD Pharmacy:   [facility], [place] [address] [place] [address] Phone: [phone] Fax: [phone]  [facility] [place], [place] [address] Phone: [phone] Fax: [phone]  [id number] [place], [place] AT [address] [place] [place] [address] Phone: [phone] Fax: [phone]     Social Determinants of Health (SDOH) Interventions    Readmission Risk Interventions Readmission Risk Prevention Plan 10/21/2020  Transportation Screening Complete  PCP or Specialist Appt within 3-5 Days Complete  HRI or [place] Complete  Social Work Consult for [place] Planning/Counseling Complete  Palliative Care Screening Not Applicable  Medication Review Press photographer) Complete  Some recent data might be hidden

## 2020-10-22 NOTE — Discharge Instructions (Signed)
Advised to follow-up with primary care physician in 1 week. Advised to follow-up with neurology and to have a MRI with contrast for further evaluation. Advised to take sodium chloride tablets twice daily for next 3 days.

## 2020-10-24 ENCOUNTER — Non-Acute Institutional Stay (SKILLED_NURSING_FACILITY): Payer: Medicare Other | Admitting: Adult Health

## 2020-10-24 ENCOUNTER — Encounter: Payer: Self-pay | Admitting: Adult Health

## 2020-10-24 DIAGNOSIS — E871 Hypo-osmolality and hyponatremia: Secondary | ICD-10-CM | POA: Diagnosis not present

## 2020-10-24 DIAGNOSIS — E876 Hypokalemia: Secondary | ICD-10-CM | POA: Diagnosis not present

## 2020-10-24 DIAGNOSIS — F339 Major depressive disorder, recurrent, unspecified: Secondary | ICD-10-CM | POA: Diagnosis not present

## 2020-10-24 DIAGNOSIS — F5101 Primary insomnia: Secondary | ICD-10-CM

## 2020-10-24 DIAGNOSIS — I10 Essential (primary) hypertension: Secondary | ICD-10-CM

## 2020-10-24 DIAGNOSIS — Z8719 Personal history of other diseases of the digestive system: Secondary | ICD-10-CM

## 2020-10-24 DIAGNOSIS — E782 Mixed hyperlipidemia: Secondary | ICD-10-CM | POA: Diagnosis not present

## 2020-10-24 DIAGNOSIS — R569 Unspecified convulsions: Secondary | ICD-10-CM | POA: Diagnosis not present

## 2020-10-24 DIAGNOSIS — F1011 Alcohol abuse, in remission: Secondary | ICD-10-CM | POA: Diagnosis not present

## 2020-10-24 MED ORDER — SODIUM CHLORIDE 1 G PO TABS: 1.0000 g | ORAL_TABLET | Freq: Three times a day (TID) | ORAL | 0 refills | Status: DC

## 2020-10-24 MED ORDER — ESZOPICLONE 2 MG PO TABS: 2.0000 mg | ORAL_TABLET | Freq: Every evening | ORAL | 0 refills | Status: DC | PRN

## 2020-10-24 MED ORDER — METOPROLOL SUCCINATE ER 50 MG PO TB24: ORAL_TABLET | ORAL | 0 refills | Status: DC

## 2020-10-24 MED ORDER — LORATADINE 10 MG PO TABS: 10.0000 mg | ORAL_TABLET | Freq: Every day | ORAL | 0 refills | Status: AC | PRN

## 2020-10-24 MED ORDER — ATORVASTATIN CALCIUM 10 MG PO TABS: 10.0000 mg | ORAL_TABLET | Freq: Every day | ORAL | 0 refills | Status: DC

## 2020-10-24 MED ORDER — BUPROPION HCL ER (XL) 150 MG PO TB24: 150.0000 mg | ORAL_TABLET | Freq: Every day | ORAL | 0 refills | Status: DC

## 2020-10-24 MED ORDER — ESOMEPRAZOLE MAGNESIUM 20 MG PO CPDR: 40.0000 mg | DELAYED_RELEASE_CAPSULE | Freq: Two times a day (BID) | ORAL | 0 refills | Status: AC

## 2020-10-24 MED ORDER — THIAMINE HCL 100 MG PO TABS: 100.0000 mg | ORAL_TABLET | Freq: Every day | ORAL | 0 refills | Status: DC

## 2020-10-24 MED ORDER — DIOVAN HCT 320-12.5 MG PO TABS: 1.0000 | ORAL_TABLET | Freq: Every day | ORAL | 0 refills | Status: DC

## 2020-10-24 NOTE — Progress Notes (Signed)
Location:  [facility] Room Number: 102-P Place of Service:  SNF (31) Provider:  Durenda Age, DNP, FNP-BC  Patient Care Team: Isaac Bliss, Rayford Halsted, MD as PCP - General (Internal Medicine)  Extended Emergency Contact Information Primary Emergency Contact: Mein,Jill Address: [place] [address] Johnnette Litter of [place] Phone: [phone] Relation: Sister Secondary Emergency Contact: Naraine,Brian Address: [place] [address] [place] Mobile Phone: [phone] Relation: Son  Code Status:  DNR  Goals of care: Advanced Directive information Advanced Directives 10/15/2020  Does Patient Have a Medical Advance Directive? No  Type of Advance Directive -  Does patient want to make changes to medical advance directive? -  Copy of [place] in Chart? -  Would patient like information on creating a medical advance directive? No - Patient declined     Chief Complaint  Patient presents with  . Discharge Note    Patient is seen for discharge home    HPI:  Pt is an 79 y.o. male seen today for discharge.  He will be discharged today per patient request with home health PT, OT and ST.  He was admitted to [facility] on 10/22/2020 post [facility] hospitalization 10/15/2020 to 10/22/2020 for seizure.  Hypertension, hyperlipidemia, osteoarthritis, upper GI bleeding in 2017 secondary to gastric and duodenal AVMs, depression and basal cell carcinoma.  He was found down in his place of residence and was brought by EMS to ED. He had a 1 minute episode of tonic-clonic seizure-like activity with notable postictal state.  He was seen by neurologist, Dr. Curly Shores.  CT head was negative for stroke.  He was found to have severe hypomagnesemia, hypokalemia and hypocalcemia.  He was given Keppra.  EEG was negative for any seizures.  MRI showed no evidence of  acute infarction, hemorrhage or mass.  He has mild chronic microvascular ischemic changes.  Neurology signed off.  It was thought that seizures were secondary to electrolyte imbalances. It was recommended for him to follow up with neurology. He was prescribed NaCl tablets twice a day for a few days.   Past Medical History:  Diagnosis Date  . Anemia   . Basal cell adenocarcinoma 09/03/2008   Qualifier: Diagnosis of  By: Lorelei Pont MD, Frederico Hamman    Overview:  Overview:  Qualifier: Diagnosis of  By: Lorelei Pont MD, Frederico Hamman   . Basal cell carcinoma of lip 2007   multiple sites  . Blood transfusion without reported diagnosis   . ED (erectile dysfunction)   . GERD (gastroesophageal reflux disease)   . HLD (hyperlipidemia)   . Hx of squamous cell carcinoma of skin 08/25/2016   L superior pectoral  . Hypertension   . Osteoarthritis   . Peripheral vascular disease ([place])    Occlusion and Stenosis of Carotid Artery  . Tobacco abuse   . Tubular adenoma of colon 2005  . Wears dentures    full upper and lower   Past Surgical History:  Procedure Laterality Date  . ANKLE ARTHROSCOPY W/ OPEN REPAIR Left   . COLONOSCOPY  2006   [place]-Tubular adenoma  . COLONOSCOPY N/A 07/23/2015   Procedure: COLONOSCOPY;  Surgeon: Lucilla Lame, MD;  Location: [facility];  Service: Gastroenterology;  Laterality: N/A;  WITH BIOPSIES---- TRANSVERSE COLON POLYP DESCENDING COLON POLYP  X  4  .  ESOPHAGOGASTRODUODENOSCOPY N/A 07/23/2015   Procedure: ESOPHAGOGASTRODUODENOSCOPY (EGD);  Surgeon: Lucilla Lame, MD;  Location: [facility];  Service: Gastroenterology;  Laterality: N/A;  WITH BIOPSY--- DUODENAL BIOPSY GASTRIC BIOPSY  . ESOPHAGOGASTRODUODENOSCOPY N/A 10/28/2016   Procedure: ESOPHAGOGASTRODUODENOSCOPY (EGD);  Surgeon: Manya Silvas, MD;  Location: [facility] ENDOSCOPY;  Service: Endoscopy;  Laterality: N/A;  . ESOPHAGOGASTRODUODENOSCOPY (EGD) WITH PROPOFOL N/A 09/02/2017   Procedure: ESOPHAGOGASTRODUODENOSCOPY  (EGD) WITH PROPOFOL;  Surgeon: Manya Silvas, MD;  Location: [facility] ENDOSCOPY;  Service: Endoscopy;  Laterality: N/A;  . SKIN CANCER EXCISION     LIP  . TONSILLECTOMY     age 24  . UPPER GI ENDOSCOPY  12/2009   Dr. Ivory Broad gastritisl negative for H. pylori; duodenal biopsies neg for sprue    No Known Allergies  Outpatient Encounter Medications as of 10/24/2020  Medication Sig  . atorvastatin (LIPITOR) 10 MG tablet Take 1 tablet (10 mg total) by mouth daily.  Marland Kitchen buPROPion (WELLBUTRIN XL) 150 MG 24 hr tablet Take 1 tablet (150 mg total) by mouth daily.  . Diclofenac Sodium 1.5 % SOLN Apply to small joints up to 4 times a day as needed (Patient taking differently: Apply topically See admin instructions. Apply to small joints up to 4 times a day as needed for pain)  . DIOVAN HCT 320-12.5 MG tablet Take 1 tablet by mouth daily.  Marland Kitchen esomeprazole (NEXIUM 24HR) 20 MG capsule Take 40 mg by mouth 2 (two) times daily before a meal.  . eszopiclone (LUNESTA) 2 MG TABS tablet Take 1 tablet (2 mg total) by mouth at bedtime as needed for sleep. Take immediately before bedtime  . loratadine (CLARITIN) 10 MG tablet Take 10 mg by mouth daily as needed for allergies.  . metoprolol succinate (TOPROL-XL) 50 MG 24 hr tablet TAKE 1 TABLET BY MOUTH DAILY. TAKE WITH OR IMMEDIATELY FOLLOWING A MEAL. (Patient taking differently: Take 50 mg by mouth daily. )  . Multiple Vitamin (MULTIVITAMIN) tablet Take 1 tablet by mouth daily.  . sodium chloride 1 g tablet Take 1 tablet (1 g total) by mouth 3 (three) times daily with meals.  . thiamine 100 MG tablet Take 1 tablet (100 mg total) by mouth daily.   No facility-administered encounter medications on file as of 10/24/2020.    Review of Systems  GENERAL: No change in appetite, no fatigue, no weight changes, no fever, chills or weakness MOUTH and THROAT: Denies oral discomfort, gingival pain or bleeding RESPIRATORY: no cough, SOB, DOE, wheezing,  hemoptysis CARDIAC: No chest pain, edema or palpitations GI: No abdominal pain, diarrhea, constipation, heart burn, nausea or vomiting GU: Denies dysuria, frequency, hematuria, incontinence, or discharge NEUROLOGICAL: Denies dizziness, syncope, numbness, or headache PSYCHIATRIC: Denies feelings of depression or anxiety. No report of hallucinations, insomnia, paranoia, or agitation   Immunization History  Administered Date(s) Administered  . Influenza Split 09/10/2012, 09/24/2013  . Influenza, High Dose Seasonal PF 09/26/2016, 09/26/2017, 10/16/2018, 08/05/2019, 08/05/2019, 09/14/2020  . Moderna SARS-COVID-2 Vaccination 01/23/2020  . Pneumococcal Conjugate-13 04/23/2015  . Pneumococcal Polysaccharide-23 01/21/2011  . Td 12/28/2003  . Tdap 08/17/2017  . Zoster 10/29/2013  . Zoster Recombinat (Shingrix) 02/17/2019, 07/10/2019   Pertinent  Health Maintenance Due  Topic Date Due  . INFLUENZA VACCINE  Completed  . PNA vac Low Risk Adult  Completed   Fall Risk  05/27/2020 05/18/2019 05/03/2018 04/27/2017 04/26/2016  Falls in the past year? 0 0 No No No  Number falls in past yr: 0 - - - -  Injury with  Fall? 0 - - - -     Vitals:   10/24/20 1315  BP: 136/74  Pulse: 79  Resp: 18  Temp: 97.9 F (36.6 C)  TempSrc: Oral  SpO2: 98%  Weight: 172 lb 9.6 oz (78.3 kg)  Height: 5\' 10"  (1.778 m)   Body mass index is 24.77 kg/m.  Physical Exam  GENERAL APPEARANCE: Well nourished. In no acute distress. Normal body habitus SKIN: Left elbow with abrasions MOUTH and THROAT: Lips are without lesions. Oral mucosa is moist and without lesions. Tongue is normal in shape, size, and color and without lesions RESPIRATORY: Breathing is even & unlabored, BS CTAB CARDIAC: RRR, no murmur,no extra heart sounds, no edema GI: Abdomen soft, normal BS, no masses, no tenderness EXTREMITIES:  Able to move X 4 extremities NEUROLOGICAL: There is no tremor. Speech is clear. Alert and oriented X 3. PSYCHIATRIC:   Affect and behavior are appropriate  Labs reviewed: Recent Labs    10/20/20 1100 10/21/20 0241 10/22/20 0214  NA 130* 130* 129*  K 3.1* 3.7 3.3*  CL 99 99 98  CO2 22 20* 22  GLUCOSE 181* 111* 111*  BUN 12 8 8   CREATININE 0.80 0.83 0.72  CALCIUM 7.2* 7.3* 8.0*  MG 1.5* 1.6* 1.8  PHOS 1.9* 2.6 3.0   Recent Labs    10/17/20 0940 10/18/20 0344 10/19/20 0448  AST 98* 71* 40  ALT 47* 43 36  ALKPHOS 43 44 38  BILITOT 1.1 1.3* 1.0  PROT 6.1* 6.1* 6.0*  ALBUMIN 3.1* 3.0* 2.6*   Recent Labs    02/22/20 0746 05/12/20 1020 10/15/20 1905 10/15/20 1916 10/16/20 0251 10/16/20 0251 10/16/20 2249 10/16/20 2249 10/18/20 0344 10/18/20 0344 10/20/20 1100 10/21/20 0241 10/22/20 0214  WBC 5.5   < > 6.8   < > 7.6  --  7.4  --  6.6  --   --   --   --   NEUTROABS 2.9  --  4.5  --  5.8  --   --   --   --   --   --   --   --   HGB 11.4*   < > 12.1*   < > 10.1*   < > 10.9*   < > 11.4*   < > 9.4* 9.6* 9.8*  HCT 33.8*   < > 36.1*   < > 29.7*   < > 31.5*   < > 33.3*   < > 27.2* 27.8* 27.7*  MCV 94.6   < > 92.8   < > 92.0  --  91.3  --  92.5  --   --   --   --   PLT 286.0   < > 267   < > 231  --  237  --  243  --   --   --   --    < > = values in this interval not displayed.   Lab Results  Component Value Date   TSH 1.35 01/18/2011   Lab Results  Component Value Date   HGBA1C 5.7 (H) 10/16/2020   Lab Results  Component Value Date   CHOL 190 02/22/2020   HDL 86.20 02/22/2020   LDLCALC 93 02/22/2020   LDLDIRECT 132.0 01/18/2011   TRIG 56.0 02/22/2020   CHOLHDL 2 02/22/2020    Significant Diagnostic Results in last 30 days:  EEG  Result Date: 10/16/2020 Lora Havens, MD     10/16/2020  2:42 PM Patient Name: [name]  MRN: [id number] Epilepsy Attending: Lora Havens Referring Physician/Provider:  Dr Inda Merlin Date: 10/16/2020 Duration: 24.14 mins Patient history: 79 year old male who presented with generalized tonic-clonic seizure in the setting of profound  electrolyte derangements.  EEG to evaluate for seizures. Level of alertness: Awake, asleep AEDs during EEG study: Ativan Technical aspects: This EEG study was done with scalp electrodes positioned according to the 10-20 International system of electrode placement. Electrical activity was acquired at a sampling rate of 500Hz  and reviewed with a high frequency filter of 70Hz  and a low frequency filter of 1Hz . EEG data were recorded continuously and digitally stored. Description: The posterior dominant rhythm consists of 9-10 Hz activity of moderate voltage (25-35 uV) seen predominantly in posterior head regions, symmetric and reactive to eye opening and eye closing. Sleep was characterized by vertex waves, sleep spindles (12 to 14 Hz), maximal frontocentral region.  There is an excessive amount of 15 to 18 Hz  beta activity distributed symmetrically and diffusely.   Hyperventilation and photic stimulation were not performed.   ABNORMALITY -Excessive beta, generalized IMPRESSION: This study is within normal limits. No seizures or epileptiform discharges were seen throughout the recording. The excessive beta activity seen in the background is most likely due to the effect of benzodiazepine and is a benign EEG pattern. Lora Havens   DG Chest 1 View  Result Date: 10/15/2020 CLINICAL DATA:  Evaluate for pneumonia EXAM: CHEST  1 VIEW COMPARISON:  05/12/2020 FINDINGS: Cardiac shadow is mildly enlarged but stable. Aortic calcifications are seen. Lungs are well aerated bilaterally. No focal infiltrate or sizable effusion is seen. No bony abnormality is noted. IMPRESSION: No active disease. Electronically Signed   By: Inez Catalina M.D.   On: 10/15/2020 22:15   DG Pelvis 1-2 Views  Result Date: 10/15/2020 CLINICAL DATA:  Syncope EXAM: PELVIS - 1-2 VIEW COMPARISON:  None. FINDINGS: Mild symmetric degenerative changes within the hips with joint space narrowing and spurring. SI joints symmetric and unremarkable. No  acute bony abnormality. Specifically, no fracture, subluxation, or dislocation. Vascular calcifications. IMPRESSION: Mild degenerative changes in the hips.  No acute bony abnormality. Electronically Signed   By: Rolm Baptise M.D.   On: 10/15/2020 20:46   MR ANGIO HEAD WO CONTRAST  Result Date: 10/17/2020 CLINICAL DATA:  Altered mental status, seizure EXAM: MRI HEAD WITHOUT CONTRAST MRA HEAD WITHOUT CONTRAST TECHNIQUE: Multiplanar, multiecho pulse sequences of the brain and surrounding structures were obtained without intravenous contrast. Angiographic images of the head were obtained using MRA technique without contrast. COMPARISON:  None. FINDINGS: Motion artifact is present. Below findings are within this limitation. MRI HEAD Brain: There is no acute infarction or intracranial hemorrhage. There is no intracranial mass, mass effect, or edema. There is no hydrocephalus or extra-axial fluid collection. Prominence of the ventricles and sulci reflects generalized parenchymal volume loss. Patchy T2 hyperintensity in the supratentorial white matter is nonspecific but may reflect mild chronic microvascular ischemic changes. Vascular: Major vessel flow voids at the skull base are preserved. Skull and upper cervical spine: Normal marrow signal is within normal limits. Sinuses/Orbits: Mild mucosal thick. Orbits are grossly unremarkable. Other: Sella is unremarkable.  Mastoid air cells are clear. MRA HEAD Intracranial internal carotid arteries are patent. Middle and anterior cerebral arteries are patent. Intracranial vertebral arteries, basilar artery, posterior cerebral arteries are patent. There is no significant stenosis or aneurysm. IMPRESSION: Motion degraded study. No evidence of acute infarction, hemorrhage, or mass. Mild chronic microvascular ischemic changes. No proximal intracranial vessel occlusion  or significant stenosis. Electronically Signed   By: Macy Mis M.D.   On: 10/17/2020 15:53   MR BRAIN WO  CONTRAST  Result Date: 10/17/2020 CLINICAL DATA:  Altered mental status, seizure EXAM: MRI HEAD WITHOUT CONTRAST MRA HEAD WITHOUT CONTRAST TECHNIQUE: Multiplanar, multiecho pulse sequences of the brain and surrounding structures were obtained without intravenous contrast. Angiographic images of the head were obtained using MRA technique without contrast. COMPARISON:  None. FINDINGS: Motion artifact is present. Below findings are within this limitation. MRI HEAD Brain: There is no acute infarction or intracranial hemorrhage. There is no intracranial mass, mass effect, or edema. There is no hydrocephalus or extra-axial fluid collection. Prominence of the ventricles and sulci reflects generalized parenchymal volume loss. Patchy T2 hyperintensity in the supratentorial white matter is nonspecific but may reflect mild chronic microvascular ischemic changes. Vascular: Major vessel flow voids at the skull base are preserved. Skull and upper cervical spine: Normal marrow signal is within normal limits. Sinuses/Orbits: Mild mucosal thick. Orbits are grossly unremarkable. Other: Sella is unremarkable.  Mastoid air cells are clear. MRA HEAD Intracranial internal carotid arteries are patent. Middle and anterior cerebral arteries are patent. Intracranial vertebral arteries, basilar artery, posterior cerebral arteries are patent. There is no significant stenosis or aneurysm. IMPRESSION: Motion degraded study. No evidence of acute infarction, hemorrhage, or mass. Mild chronic microvascular ischemic changes. No proximal intracranial vessel occlusion or significant stenosis. Electronically Signed   By: Macy Mis M.D.   On: 10/17/2020 15:53   CT ABDOMEN PELVIS W CONTRAST  Result Date: 10/16/2020 CLINICAL DATA:  Cancer of unknown primary. Staging for malignancy in a patient with weakness, poor appetite, and urinary retention. EXAM: CT ABDOMEN AND PELVIS WITH CONTRAST TECHNIQUE: Multidetector CT imaging of the abdomen and  pelvis was performed using the standard protocol following bolus administration of intravenous contrast. CONTRAST:  149mL OMNIPAQUE IOHEXOL 300 MG/ML  SOLN COMPARISON:  CT right hip 10/15/2020 FINDINGS: Lower chest: Atelectasis in the lung bases. Cardiac enlargement. Coronary artery calcifications. Hepatobiliary: No focal liver abnormality is seen. No gallstones, gallbladder wall thickening, or biliary dilatation. Pancreas: Unremarkable. No pancreatic ductal dilatation or surrounding inflammatory changes. Spleen: Normal in size without focal abnormality. Adrenals/Urinary Tract: Adrenal glands are unremarkable. Kidneys are normal, without renal calculi, focal lesion, or hydronephrosis. Bladder is unremarkable. Stomach/Bowel: Stomach, small bowel, and colon are not abnormally distended. No wall thickening or inflammatory infiltration identified. Appendix is normal. Vascular/Lymphatic: Diffuse calcification of the aorta. Small aortic aneurysm measuring 3 cm AP dimension. No significant lymphadenopathy. Reproductive: Prostate is unremarkable. Other: No free air or free fluid in the abdomen. Abdominal wall musculature appears intact. Subcutaneous soft tissue gas in the anterior abdomen likely represents injection site. Musculoskeletal: Degenerative changes in the lumbar spine and hips. Degenerative cyst in the right acetabulum. Degenerative cyst versus bone cyst in the right proximal femur. No destructive bone lesions identified. IMPRESSION: 1. No evidence of primary or metastatic disease in the abdomen or pelvis. 2. Cardiac enlargement with coronary artery calcifications. 3. Small aortic aneurysm measuring 3 cm AP dimension. 4. Degenerative changes in the lumbar spine and hips. 5. Degenerative cyst versus bone cyst in the right proximal femur. Aortic Atherosclerosis (ICD10-I70.0). Electronically Signed   By: Lucienne Capers M.D.   On: 10/16/2020 01:41   CT HIP RIGHT WO CONTRAST  Result Date: 10/16/2020 CLINICAL  DATA:  Syncope. Patient found on floor. Evaluate for hip fracture. EXAM: CT OF THE RIGHT HIP WITHOUT CONTRAST TECHNIQUE: Multidetector CT imaging of the right hip was  performed according to the standard protocol. Multiplanar CT image reconstructions were also generated. COMPARISON:  Right hip radiographs 10/14/2020 FINDINGS: Bones/Joint/Cartilage No evidence of acute fracture or dislocation of the right hip. Degenerative changes are present in the right hip. Subcortical cyst in the posterior acetabulum is likely degenerative. Prominent cyst with surrounding sclerosis demonstrated in the anterior aspect of the right femoral head may represent a degenerative cyst or a bone cyst. No expansile change, periosteal reaction, or bone destruction to suggest an aggressive lesion. No significant joint effusion. Ligaments Suboptimally assessed by CT. Muscles and Tendons Right hip musculature appears intact. No intramuscular mass, collection, or hematoma. Soft tissues Vascular calcifications in the external iliac and common femoral arteries. No soft tissue hematoma or collection. IMPRESSION: 1. No evidence of acute fracture or dislocation of the right hip. 2. Degenerative changes in the right hip. 3. Prominent cyst with surrounding sclerosis in the anterior aspect of the right femoral head may represent a degenerative cyst or a bone cyst. Electronically Signed   By: Lucienne Capers M.D.   On: 10/16/2020 00:03   DG HIP UNILAT WITH PELVIS 2-3 VIEWS RIGHT  Result Date: 10/14/2020 CLINICAL DATA:  Right hip pain x2 weeks. EXAM: DG HIP (WITH OR WITHOUT PELVIS) 2-3V RIGHT COMPARISON:  None. FINDINGS: There is no evidence of hip fracture or dislocation. Moderate severity degenerative changes seen involving the bilateral hips. This is seen in the form of joint space narrowing and acetabular sclerosis. Chronic curvilinear sclerosis and subcortical cysts are seen involving the right humeral head. Marked severity vascular  calcification is noted. IMPRESSION: Moderate severity degenerative changes involving the bilateral hips. Electronically Signed   By: Virgina Norfolk M.D.   On: 10/14/2020 23:58   CT HEAD CODE STROKE WO CONTRAST  Result Date: 10/15/2020 CLINICAL DATA:  Code stroke. Initial evaluation for acute left-sided weakness. EXAM: CT HEAD WITHOUT CONTRAST TECHNIQUE: Contiguous axial images were obtained from the base of the skull through the vertex without intravenous contrast. COMPARISON:  None available. FINDINGS: Brain: Generalized age-related cerebral atrophy with mild-to-moderate chronic microvascular ischemic disease. No acute intracranial hemorrhage. No acute large vessel territory infarct. No mass lesion, midline shift or mass effect. No hydrocephalus or extra-axial fluid collection. Vascular: No hyperdense vessel. Calcified atherosclerosis present at skull base. Skull: Scalp soft tissues and calvarium within normal limits. Sinuses/Orbits: Globes and orbital soft tissues demonstrate no acute finding. Scattered mucosal thickening noted within the ethmoidal air cells and maxillary sinuses. Mastoid air cells are clear. Other: None. ASPECTS [facility] Stroke Program Early CT Score) - Ganglionic level infarction (caudate, lentiform nuclei, internal capsule, insula, M1-M3 cortex): 7 - Supraganglionic infarction (M4-M6 cortex): 3 Total score (0-10 with 10 being normal): 10 IMPRESSION: 1. No acute intracranial infarct or other abnormality. 2. ASPECTS is 10. 3. Age-related cerebral atrophy with mild-to-moderate chronic microvascular ischemic disease. These results were communicated to Dr. Curly Shores at 7:28 pmon 10/20/2021by text page via the [facility] messaging system. Electronically Signed   By: Jeannine Boga M.D.   On: 10/15/2020 19:30   VAS [place] LOWER EXTREMITY VENOUS (DVT)  Result Date: 10/16/2020  Lower Venous DVTStudy Indications: Pain, and Swelling.  Limitations: Poor ultrasound/tissue interface. Comparison Study:  No prior studies. Performing Technologist: Darlin Coco  Examination Guidelines: A complete evaluation includes B-mode imaging, spectral Doppler, color Doppler, and power Doppler as needed of all accessible portions of each vessel. Bilateral testing is considered an integral part of a complete examination. Limited examinations for reoccurring indications may be performed as noted. The reflux  portion of the exam is performed with the patient in reverse Trendelenburg.  +---------+---------------+---------+-----------+----------+--------------+ RIGHT    CompressibilityPhasicitySpontaneityPropertiesThrombus Aging +---------+---------------+---------+-----------+----------+--------------+ CFV      Full           Yes      Yes                                 +---------+---------------+---------+-----------+----------+--------------+ SFJ      Full                                                        +---------+---------------+---------+-----------+----------+--------------+ FV Prox  Full                                                        +---------+---------------+---------+-----------+----------+--------------+ FV Mid   Full                                                        +---------+---------------+---------+-----------+----------+--------------+ FV DistalFull                                                        +---------+---------------+---------+-----------+----------+--------------+ PFV      Full                                                        +---------+---------------+---------+-----------+----------+--------------+ POP      Full           Yes      Yes                                 +---------+---------------+---------+-----------+----------+--------------+ PTV      Full                                                        +---------+---------------+---------+-----------+----------+--------------+ PERO                                                   Not visualized +---------+---------------+---------+-----------+----------+--------------+   +----+---------------+---------+-----------+----------+--------------+ LEFTCompressibilityPhasicitySpontaneityPropertiesThrombus Aging +----+---------------+---------+-----------+----------+--------------+ CFV Full           Yes      Yes                                 +----+---------------+---------+-----------+----------+--------------+  Summary: RIGHT: - There is no evidence of deep vein thrombosis in the lower extremity.  - A complex cystic structure is found in the popliteal fossa.  LEFT: - No evidence of common femoral vein obstruction.  *See table(s) above for measurements and observations. Electronically signed by Servando Snare MD on 10/16/2020 at 4:11:42 PM.    Final     Assessment/Plan  1. Seizure ([place]) -  Etiology of seizures was thought to be due to electrolyte imbalances, low ca, low mag, low k. -  EEG no evidence of seizures, MRI no evidence of acute infarction -  Follow up with neurology  2. Hypomagnesemia -  Was supplemented in hospital  3. Hypokalemia Lab Results  Component Value Date   K 3.3 (L) 10/22/2020   -  Was supplemented in hospital  4. History of upper gastrointestinal bleeding - esomeprazole (NEXIUM 24HR) 20 MG capsule; Take 2 capsules (40 mg total) by mouth 2 (two) times daily before a meal.  Dispense: 120 capsule; Refill: 0  5. Essential hypertension - DIOVAN HCT 320-12.5 MG tablet; Take 1 tablet by mouth daily.  Dispense: 30 tablet; Refill: 0 - metoprolol succinate (TOPROL-XL) 50 MG 24 hr tablet; TAKE 1 TABLET BY MOUTH DAILY. TAKE WITH OR IMMEDIATELY FOLLOWING A MEAL.  Dispense: 30 tablet; Refill: 0  6. Mixed hyperlipidemia - atorvastatin (LIPITOR) 10 MG tablet; Take 1 tablet (10 mg total) by mouth daily.  Dispense: 30 tablet; Refill: 0  7. Major depression, recurrent, chronic (HCC) - buPROPion (WELLBUTRIN XL) 150 MG 24 hr  tablet; Take 1 tablet (150 mg total) by mouth daily.  Dispense: 30 tablet; Refill: 0  8. History of alcohol abuse - thiamine 100 MG tablet; Take 1 tablet (100 mg total) by mouth daily.  Dispense: 30 tablet; Refill: 0  9. Primary insomnia - eszopiclone (LUNESTA) 2 MG TABS tablet; Take 1 tablet (2 mg total) by mouth at bedtime as needed for sleep. Take immediately before bedtime  Dispense: 12 tablet; Refill: 0  10. Hyponatremia - sodium chloride 1 g tablet; Take 1 tablet (1 g total) by mouth 3 (three) times daily with meals.  Dispense: 90 tablet; Refill: 0     I have filled out patient's discharge paperwork and e-prescribed medications.  Patient will have home health PT, OT and ST.  DME provided:  None  Total discharge time: Greater than 30 minutes Greater than 50% was spent in counseling and coordination of care.   Discharge time involved coordination of the discharge process with social worker, nursing staff and therapy department. Medical justification for home health services verified.   Durenda Age, DNP, MSN, FNP-BC [facility] [phone] (Monday-Friday 8:00 a.m. - 5:00 p.m.) [phone] (after hours)

## 2020-10-25 DIAGNOSIS — Z87891 Personal history of nicotine dependence: Secondary | ICD-10-CM | POA: Diagnosis not present

## 2020-10-25 DIAGNOSIS — R7303 Prediabetes: Secondary | ICD-10-CM | POA: Diagnosis not present

## 2020-10-25 DIAGNOSIS — I2510 Atherosclerotic heart disease of native coronary artery without angina pectoris: Secondary | ICD-10-CM | POA: Diagnosis not present

## 2020-10-25 DIAGNOSIS — E876 Hypokalemia: Secondary | ICD-10-CM | POA: Diagnosis not present

## 2020-10-25 DIAGNOSIS — E871 Hypo-osmolality and hyponatremia: Secondary | ICD-10-CM | POA: Diagnosis not present

## 2020-10-25 DIAGNOSIS — Z9181 History of falling: Secondary | ICD-10-CM | POA: Diagnosis not present

## 2020-10-25 DIAGNOSIS — R569 Unspecified convulsions: Secondary | ICD-10-CM | POA: Diagnosis not present

## 2020-10-25 DIAGNOSIS — D126 Benign neoplasm of colon, unspecified: Secondary | ICD-10-CM | POA: Diagnosis not present

## 2020-10-25 DIAGNOSIS — F1011 Alcohol abuse, in remission: Secondary | ICD-10-CM | POA: Diagnosis not present

## 2020-10-25 DIAGNOSIS — I10 Essential (primary) hypertension: Secondary | ICD-10-CM | POA: Diagnosis not present

## 2020-10-25 DIAGNOSIS — F339 Major depressive disorder, recurrent, unspecified: Secondary | ICD-10-CM | POA: Diagnosis not present

## 2020-10-25 DIAGNOSIS — N529 Male erectile dysfunction, unspecified: Secondary | ICD-10-CM | POA: Diagnosis not present

## 2020-10-25 DIAGNOSIS — I69828 Other speech and language deficits following other cerebrovascular disease: Secondary | ICD-10-CM | POA: Diagnosis not present

## 2020-10-25 DIAGNOSIS — E782 Mixed hyperlipidemia: Secondary | ICD-10-CM | POA: Diagnosis not present

## 2020-10-25 DIAGNOSIS — Z85828 Personal history of other malignant neoplasm of skin: Secondary | ICD-10-CM | POA: Diagnosis not present

## 2020-10-25 DIAGNOSIS — Z9089 Acquired absence of other organs: Secondary | ICD-10-CM | POA: Diagnosis not present

## 2020-10-25 DIAGNOSIS — M1990 Unspecified osteoarthritis, unspecified site: Secondary | ICD-10-CM | POA: Diagnosis not present

## 2020-10-25 DIAGNOSIS — Q2733 Arteriovenous malformation of digestive system vessel: Secondary | ICD-10-CM | POA: Diagnosis not present

## 2020-10-25 DIAGNOSIS — I739 Peripheral vascular disease, unspecified: Secondary | ICD-10-CM | POA: Diagnosis not present

## 2020-10-25 DIAGNOSIS — F5101 Primary insomnia: Secondary | ICD-10-CM | POA: Diagnosis not present

## 2020-10-25 DIAGNOSIS — K219 Gastro-esophageal reflux disease without esophagitis: Secondary | ICD-10-CM | POA: Diagnosis not present

## 2020-10-27 DIAGNOSIS — I10 Essential (primary) hypertension: Secondary | ICD-10-CM | POA: Diagnosis not present

## 2020-10-27 DIAGNOSIS — I2510 Atherosclerotic heart disease of native coronary artery without angina pectoris: Secondary | ICD-10-CM | POA: Diagnosis not present

## 2020-10-27 DIAGNOSIS — E876 Hypokalemia: Secondary | ICD-10-CM | POA: Diagnosis not present

## 2020-10-27 DIAGNOSIS — R569 Unspecified convulsions: Secondary | ICD-10-CM | POA: Diagnosis not present

## 2020-10-27 DIAGNOSIS — E871 Hypo-osmolality and hyponatremia: Secondary | ICD-10-CM | POA: Diagnosis not present

## 2020-10-28 ENCOUNTER — Telehealth: Payer: Self-pay | Admitting: Internal Medicine

## 2020-10-28 DIAGNOSIS — I2510 Atherosclerotic heart disease of native coronary artery without angina pectoris: Secondary | ICD-10-CM | POA: Diagnosis not present

## 2020-10-28 DIAGNOSIS — E876 Hypokalemia: Secondary | ICD-10-CM | POA: Diagnosis not present

## 2020-10-28 DIAGNOSIS — E871 Hypo-osmolality and hyponatremia: Secondary | ICD-10-CM | POA: Diagnosis not present

## 2020-10-28 DIAGNOSIS — I10 Essential (primary) hypertension: Secondary | ICD-10-CM | POA: Diagnosis not present

## 2020-10-28 DIAGNOSIS — R569 Unspecified convulsions: Secondary | ICD-10-CM | POA: Diagnosis not present

## 2020-10-28 NOTE — Telephone Encounter (Signed)
[place] Can leave a message, it's a confidential voice mail  She needs verbal orders for Skilled Nursing  1 x week for 1 week 2 x week for 2 weeks 1 x week for 6 weeks

## 2020-10-29 ENCOUNTER — Telehealth: Payer: Self-pay | Admitting: Internal Medicine

## 2020-10-29 NOTE — Telephone Encounter (Signed)
Left detailed message on machine with verbal orders 

## 2020-10-29 NOTE — Telephone Encounter (Signed)
Ok to place orders as requested.

## 2020-10-31 ENCOUNTER — Other Ambulatory Visit: Payer: Self-pay

## 2020-10-31 ENCOUNTER — Ambulatory Visit (INDEPENDENT_AMBULATORY_CARE_PROVIDER_SITE_OTHER): Payer: Medicare Other | Admitting: Internal Medicine

## 2020-10-31 ENCOUNTER — Telehealth: Payer: Self-pay | Admitting: *Deleted

## 2020-10-31 VITALS — BP 136/84 | HR 95 | Temp 98.2°F | Ht 70.0 in | Wt 172.3 lb

## 2020-10-31 DIAGNOSIS — Z09 Encounter for follow-up examination after completed treatment for conditions other than malignant neoplasm: Secondary | ICD-10-CM

## 2020-10-31 DIAGNOSIS — I10 Essential (primary) hypertension: Secondary | ICD-10-CM

## 2020-10-31 DIAGNOSIS — M2550 Pain in unspecified joint: Secondary | ICD-10-CM

## 2020-10-31 DIAGNOSIS — E876 Hypokalemia: Secondary | ICD-10-CM

## 2020-10-31 DIAGNOSIS — R569 Unspecified convulsions: Secondary | ICD-10-CM | POA: Diagnosis not present

## 2020-10-31 DIAGNOSIS — I6523 Occlusion and stenosis of bilateral carotid arteries: Secondary | ICD-10-CM | POA: Diagnosis not present

## 2020-10-31 DIAGNOSIS — M25551 Pain in right hip: Secondary | ICD-10-CM | POA: Diagnosis not present

## 2020-10-31 MED ORDER — COLCHICINE 0.6 MG PO TABS: 0.6000 mg | ORAL_TABLET | Freq: Two times a day (BID) | ORAL | 1 refills | Status: DC

## 2020-10-31 MED ORDER — METOPROLOL SUCCINATE ER 50 MG PO TB24: ORAL_TABLET | ORAL | 1 refills | Status: DC

## 2020-10-31 NOTE — Telephone Encounter (Signed)
Patient called stating Dr Jerilee Hoh gave him the wrong prescription and he paid 250$ for it and the pharmacy will not take it back.   The medication that was called in is nexonetrazole that patient states is incorrect

## 2020-10-31 NOTE — Progress Notes (Signed)
Established Patient Office Visit     This visit occurred during the SARS-CoV-2 public health emergency.  Safety protocols were in place, including screening questions prior to the visit, additional usage of staff PPE, and extensive cleaning of exam room while observing appropriate contact time as indicated for disinfecting solutions.    CC/Reason for Visit: Hospital discharge follow-up  HPI: Devin Garza is a 79 y.o. male who is coming in today for the above mentioned reasons. Past Medical History is significant for: Carotid artery disease, hypertension, GERD, impaired glucose tolerance, hyperlipidemia, insomnia, history of alcohol abuse and tobacco abuse.  He has not smoked in 6 months.  I saw him on October 19.  The day after this he was speaking with a friend on the phone and friend noted him to have slurred speech, EMS was deployed and he was taken to the hospital as a code stroke.  Hospital charts have been reviewed in detail.  His brain imaging was negative, however he did have a seizure event and significant electrolyte abnormalities including hypokalemia, hypomagnesemia, hyponatremia and hypocalcemia which were thought to be contributing.  He was not started on antiepileptic therapy, he was cleared by neurology for discharge home.  He was sent to skilled nursing facility which he signed out of [place].  He continues to complain of varied joint pain, most significantly right hip, right ankle and left wrist.  His wrist and ankle are swollen.  He had an x-ray of his right hip on 10/19 that showed severe degenerative changes.  Chart review shows that he has a history of gout, he is currently not taking any medications for gout.  He has not yet followed up with neurology.   Past Medical/Surgical History: Past Medical History:  Diagnosis Date  . Anemia   . Basal cell adenocarcinoma 09/03/2008   Qualifier: Diagnosis of  By: Lorelei Pont MD, Frederico Hamman    Overview:  Overview:  Qualifier: Diagnosis of   By: Lorelei Pont MD, Frederico Hamman   . Basal cell carcinoma of lip 2007   multiple sites  . Blood transfusion without reported diagnosis   . ED (erectile dysfunction)   . GERD (gastroesophageal reflux disease)   . HLD (hyperlipidemia)   . Hx of squamous cell carcinoma of skin 08/25/2016   L superior pectoral  . Hypertension   . Osteoarthritis   . Peripheral vascular disease ([place])    Occlusion and Stenosis of Carotid Artery  . Tobacco abuse   . Tubular adenoma of colon 2005  . Wears dentures    full upper and lower    Past Surgical History:  Procedure Laterality Date  . ANKLE ARTHROSCOPY W/ OPEN REPAIR Left   . COLONOSCOPY  2006   [place]-Tubular adenoma  . COLONOSCOPY N/A 07/23/2015   Procedure: COLONOSCOPY;  Surgeon: Lucilla Lame, MD;  Location: [place];  Service: Gastroenterology;  Laterality: N/A;  WITH BIOPSIES---- TRANSVERSE COLON POLYP DESCENDING COLON POLYP  X  4  . ESOPHAGOGASTRODUODENOSCOPY N/A 07/23/2015   Procedure: ESOPHAGOGASTRODUODENOSCOPY (EGD);  Surgeon: Lucilla Lame, MD;  Location: [place];  Service: Gastroenterology;  Laterality: N/A;  WITH BIOPSY--- DUODENAL BIOPSY GASTRIC BIOPSY  . ESOPHAGOGASTRODUODENOSCOPY N/A 10/28/2016   Procedure: ESOPHAGOGASTRODUODENOSCOPY (EGD);  Surgeon: Manya Silvas, MD;  Location: [facility] ENDOSCOPY;  Service: Endoscopy;  Laterality: N/A;  . ESOPHAGOGASTRODUODENOSCOPY (EGD) WITH PROPOFOL N/A 09/02/2017   Procedure: ESOPHAGOGASTRODUODENOSCOPY (EGD) WITH PROPOFOL;  Surgeon: Manya Silvas, MD;  Location[facility]COPY;  Service: Endoscopy;  Laterality: N/A;  . SKIN CANCER  EXCISION     LIP  . TONSILLECTOMY     age 66  . UPPER GI ENDOSCOPY  12/2009   Dr. Ivory Broad gastritisl negative for H. pylori; duodenal biopsies neg for sprue    Social History:  reports that he quit smoking about 7 months ago. His smoking use included cigarettes. He has a 20.00 pack-year smoking history. He has never used smokeless tobacco. He  reports current alcohol use of about 14.0 standard drinks of alcohol per week. He reports that he does not use drugs.  Allergies: No Known Allergies  Family History:  Family History  Problem Relation Age of Onset  . Lung cancer Mother 34  . Hearing loss Father   . Throat cancer Father   . Heart attack Father   . CAD Father   . Ovarian cancer Sister      Current Outpatient Medications:  .  atorvastatin (LIPITOR) 10 MG tablet, Take 1 tablet (10 mg total) by mouth daily., Disp: 30 tablet, Rfl: 0 .  buPROPion (WELLBUTRIN XL) 150 MG 24 hr tablet, Take 1 tablet (150 mg total) by mouth daily., Disp: 30 tablet, Rfl: 0 .  Diclofenac Sodium 1.5 % SOLN, Apply to small joints up to 4 times a day as needed, Disp: 450 mL, Rfl: 5 .  DIOVAN HCT 320-12.5 MG tablet, Take 1 tablet by mouth daily., Disp: 30 tablet, Rfl: 0 .  esomeprazole (NEXIUM 24HR) 20 MG capsule, Take 2 capsules (40 mg total) by mouth 2 (two) times daily before a meal., Disp: 120 capsule, Rfl: 0 .  eszopiclone (LUNESTA) 2 MG TABS tablet, Take 1 tablet (2 mg total) by mouth at bedtime as needed for sleep. Take immediately before bedtime, Disp: 12 tablet, Rfl: 0 .  loratadine (CLARITIN) 10 MG tablet, Take 1 tablet (10 mg total) by mouth daily as needed for allergies., Disp: 30 tablet, Rfl: 0 .  metoprolol succinate (TOPROL-XL) 50 MG 24 hr tablet, TAKE 1 TABLET BY MOUTH DAILY. TAKE WITH OR IMMEDIATELY FOLLOWING A MEAL., Disp: 90 tablet, Rfl: 1 .  Multiple Vitamin (MULTIVITAMIN) tablet, Take 1 tablet by mouth daily., Disp: , Rfl:  .  sodium chloride 1 g tablet, Take 1 tablet (1 g total) by mouth 3 (three) times daily with meals., Disp: 90 tablet, Rfl: 0 .  thiamine 100 MG tablet, Take 1 tablet (100 mg total) by mouth daily., Disp: 30 tablet, Rfl: 0 .  colchicine 0.6 MG tablet, Take 1 tablet (0.6 mg total) by mouth 2 (two) times daily., Disp: 60 tablet, Rfl: 1  Review of Systems:  Constitutional: Denies fever, chills, diaphoresis, appetite  change and fatigue.  HEENT: Denies photophobia, eye pain, redness, hearing loss, ear pain, congestion, sore throat, rhinorrhea, sneezing, mouth sores, trouble swallowing, neck pain, neck stiffness and tinnitus.   Respiratory: Denies SOB, DOE, cough, chest tightness,  and wheezing.   Cardiovascular: Denies chest pain, palpitations and leg swelling.  Gastrointestinal: Denies nausea, vomiting, abdominal pain, diarrhea, constipation, blood in stool and abdominal distention.  Genitourinary: Denies dysuria, urgency, frequency, hematuria, flank pain and difficulty urinating.  Endocrine: Denies: hot or cold intolerance, sweats, changes in hair or nails, polyuria, polydipsia. Musculoskeletal: Denies myalgias, back pain. Skin: Denies pallor, rash and wound.  Neurological: Denies dizziness, seizures, syncope, weakness, light-headedness, numbness and headaches.  Hematological: Denies adenopathy. Easy bruising, personal or family bleeding history  Psychiatric/Behavioral: Denies suicidal ideation, mood changes, confusion, nervousness, sleep disturbance and agitation    Physical Exam: Vitals:   10/31/20 0934  BP: 136/84  Pulse: 95  Temp: 98.2 F (36.8 C)  TempSrc: Oral  SpO2: 99%  Weight: 172 lb 4.8 oz (78.2 kg)  Height: 5\' 10"  (1.778 m)    Body mass index is 24.72 kg/m.   Constitutional: NAD, calm, comfortable Eyes: PERRL, lids and conjunctivae normal, wears corrective lenses ENMT: Mucous membranes are moist.  Respiratory: clear to auscultation bilaterally, no wheezing, no crackles. Normal respiratory effort. No accessory muscle use.  Cardiovascular: Regular rate and rhythm, no murmurs / rubs / gallops. No extremity edema.  Musculoskeletal: Edema and point tenderness of wrist and right ankle. Psychiatric: Normal judgment and insight. Alert and oriented x 3. Normal mood.    Impression and Plan:  Hospital discharge follow-up Essential hypertension Multiple joint pain  Right hip  pain Seizure ([place]) Hypokalemia Hypomagnesemia  [facility] charts have been reviewed in detail. -He had a seizure event thought due to severe electrolyte abnormalities and alcohol use.  Unclear if there was an element of alcohol intoxication or withdrawal. -Check CBC and CMP and magnesium level today per hospital discharge physician. -Resume physical and occupational therapy. -With multiple joint pain and edema, suspect gout flare.  Start colchicine twice daily, if no improvement in a week he can try a prednisone taper. -With severe degenerative changes of the right hip, I will send him to see orthopedics for further plan of care.  Time spent: 38 minutes on chart review, patient examination and coming up with plan of care.   Patient Instructions  -Nice seeing you today!!  -Lab work today; will notify you once results are available.  -Referral to orthopedist.  -Make sure your follow up by the neurologist as advised by hospital physicians due to your seizure event.  -Schedule follow up in 4 months.     Lelon Frohlich, MD [facility]

## 2020-10-31 NOTE — Telephone Encounter (Signed)
Not sure what this is about. I called in Colchicine for him which is what we had discussed.

## 2020-10-31 NOTE — Patient Instructions (Signed)
-  Nice seeing you today!!  -Lab work today; will notify you once results are available.  -Referral to orthopedist.  -Make sure your follow up by the neurologist as advised by hospital physicians due to your seizure event.  -Schedule follow up in 4 months.

## 2020-11-01 ENCOUNTER — Telehealth: Payer: Self-pay | Admitting: Family Medicine

## 2020-11-01 DIAGNOSIS — R790 Abnormal level of blood mineral: Secondary | ICD-10-CM

## 2020-11-01 LAB — CBC WITH DIFFERENTIAL/PLATELET
Absolute Monocytes: 929 cells/uL (ref 200–950)
Basophils Absolute: 86 cells/uL (ref 0–200)
Basophils Relative: 1.2 %
Eosinophils Absolute: 331 cells/uL (ref 15–500)
Eosinophils Relative: 4.6 %
HCT: 30.3 % — ABNORMAL LOW (ref 38.5–50.0)
Hemoglobin: 10.1 g/dL — ABNORMAL LOW (ref 13.2–17.1)
Lymphs Abs: 1267 cells/uL (ref 850–3900)
MCH: 31.8 pg (ref 27.0–33.0)
MCHC: 33.3 g/dL (ref 32.0–36.0)
MCV: 95.3 fL (ref 80.0–100.0)
MPV: 9.1 fL (ref 7.5–12.5)
Monocytes Relative: 12.9 %
Neutro Abs: 4586 cells/uL (ref 1500–7800)
Neutrophils Relative %: 63.7 %
Platelets: 515 10*3/uL — ABNORMAL HIGH (ref 140–400)
RBC: 3.18 10*6/uL — ABNORMAL LOW (ref 4.20–5.80)
RDW: 13 % (ref 11.0–15.0)
Total Lymphocyte: 17.6 %
WBC: 7.2 10*3/uL (ref 3.8–10.8)

## 2020-11-01 LAB — COMPREHENSIVE METABOLIC PANEL
AG Ratio: 1.6 (calc) (ref 1.0–2.5)
ALT: 20 U/L (ref 9–46)
AST: 27 U/L (ref 10–35)
Albumin: 4.1 g/dL (ref 3.6–5.1)
Alkaline phosphatase (APISO): 46 U/L (ref 35–144)
BUN: 22 mg/dL (ref 7–25)
CO2: 27 mmol/L (ref 20–32)
Calcium: 9.6 mg/dL (ref 8.6–10.3)
Chloride: 98 mmol/L (ref 98–110)
Creat: 0.88 mg/dL (ref 0.70–1.11)
Globulin: 2.5 g/dL (calc) (ref 1.9–3.7)
Glucose, Bld: 142 mg/dL — ABNORMAL HIGH (ref 65–99)
Potassium: 4.9 mmol/L (ref 3.5–5.3)
Sodium: 136 mmol/L (ref 135–146)
Total Bilirubin: 0.3 mg/dL (ref 0.2–1.2)
Total Protein: 6.6 g/dL (ref 6.1–8.1)

## 2020-11-01 LAB — MAGNESIUM: Magnesium: 0.7 mg/dL — CL (ref 1.5–2.5)

## 2020-11-01 NOTE — Telephone Encounter (Signed)
On call note re: low mag.  He had recent OV as outpatien.  Other lytes are wnl.  I asked on call RN to call and check on patient. If any sx (neuromuscular sx, sz, etc) then to ER/911.  If no sx, then start mag oxide 400mg  BID and have patient call PCP to recheck labs Monday.  To PCP for follow up.

## 2020-11-04 NOTE — Telephone Encounter (Signed)
Patient scheduled.

## 2020-11-04 NOTE — Telephone Encounter (Signed)
Left detailed message on machine for patient to come to the office today for a lab appointment.  Lab ordered

## 2020-11-04 NOTE — Addendum Note (Signed)
Addended by: Westley Hummer B on: 11/04/2020 12:06 PM   Modules accepted: Orders

## 2020-11-04 NOTE — Telephone Encounter (Signed)
Let's order Mag levels and make sure he can come in today for it

## 2020-11-05 ENCOUNTER — Other Ambulatory Visit: Payer: Self-pay | Admitting: Internal Medicine

## 2020-11-05 ENCOUNTER — Telehealth: Payer: Self-pay

## 2020-11-05 ENCOUNTER — Other Ambulatory Visit (INDEPENDENT_AMBULATORY_CARE_PROVIDER_SITE_OTHER): Payer: Medicare Other

## 2020-11-05 DIAGNOSIS — I2510 Atherosclerotic heart disease of native coronary artery without angina pectoris: Secondary | ICD-10-CM | POA: Diagnosis not present

## 2020-11-05 DIAGNOSIS — R569 Unspecified convulsions: Secondary | ICD-10-CM | POA: Diagnosis not present

## 2020-11-05 DIAGNOSIS — I10 Essential (primary) hypertension: Secondary | ICD-10-CM | POA: Diagnosis not present

## 2020-11-05 DIAGNOSIS — E871 Hypo-osmolality and hyponatremia: Secondary | ICD-10-CM | POA: Diagnosis not present

## 2020-11-05 DIAGNOSIS — R790 Abnormal level of blood mineral: Secondary | ICD-10-CM | POA: Diagnosis not present

## 2020-11-05 DIAGNOSIS — E876 Hypokalemia: Secondary | ICD-10-CM | POA: Diagnosis not present

## 2020-11-05 LAB — MAGNESIUM: Magnesium: 0.6 mg/dL — CL (ref 1.5–2.5)

## 2020-11-05 MED ORDER — MAGNESIUM OXIDE 400 MG PO CAPS: 400.0000 mg | ORAL_CAPSULE | Freq: Three times a day (TID) | ORAL | 0 refills | Status: AC

## 2020-11-05 NOTE — Telephone Encounter (Signed)
Addressed on result note

## 2020-11-05 NOTE — Telephone Encounter (Signed)
Patient is aware.  Lab appointment and order placed. 

## 2020-11-06 DIAGNOSIS — I10 Essential (primary) hypertension: Secondary | ICD-10-CM | POA: Diagnosis not present

## 2020-11-06 DIAGNOSIS — E871 Hypo-osmolality and hyponatremia: Secondary | ICD-10-CM | POA: Diagnosis not present

## 2020-11-06 DIAGNOSIS — E876 Hypokalemia: Secondary | ICD-10-CM | POA: Diagnosis not present

## 2020-11-06 DIAGNOSIS — I2510 Atherosclerotic heart disease of native coronary artery without angina pectoris: Secondary | ICD-10-CM | POA: Diagnosis not present

## 2020-11-06 DIAGNOSIS — R569 Unspecified convulsions: Secondary | ICD-10-CM | POA: Diagnosis not present

## 2020-11-07 ENCOUNTER — Encounter: Payer: Self-pay | Admitting: Podiatry

## 2020-11-07 ENCOUNTER — Other Ambulatory Visit: Payer: Self-pay

## 2020-11-07 ENCOUNTER — Ambulatory Visit (INDEPENDENT_AMBULATORY_CARE_PROVIDER_SITE_OTHER): Payer: Medicare Other | Admitting: Podiatry

## 2020-11-07 DIAGNOSIS — R569 Unspecified convulsions: Secondary | ICD-10-CM | POA: Diagnosis not present

## 2020-11-07 DIAGNOSIS — I10 Essential (primary) hypertension: Secondary | ICD-10-CM | POA: Diagnosis not present

## 2020-11-07 DIAGNOSIS — I2510 Atherosclerotic heart disease of native coronary artery without angina pectoris: Secondary | ICD-10-CM | POA: Diagnosis not present

## 2020-11-07 DIAGNOSIS — L600 Ingrowing nail: Secondary | ICD-10-CM | POA: Diagnosis not present

## 2020-11-07 DIAGNOSIS — L608 Other nail disorders: Secondary | ICD-10-CM | POA: Diagnosis not present

## 2020-11-07 DIAGNOSIS — B351 Tinea unguium: Secondary | ICD-10-CM | POA: Diagnosis not present

## 2020-11-07 DIAGNOSIS — E876 Hypokalemia: Secondary | ICD-10-CM | POA: Diagnosis not present

## 2020-11-07 DIAGNOSIS — M79609 Pain in unspecified limb: Secondary | ICD-10-CM

## 2020-11-07 DIAGNOSIS — E871 Hypo-osmolality and hyponatremia: Secondary | ICD-10-CM | POA: Diagnosis not present

## 2020-11-07 NOTE — Progress Notes (Signed)
Complaint:  Visit Type: Patient returns to my office for continued preventative foot care services. Complaint: Patient states" my nails have grown long and thick and become painful to walk and wear shoes"  The patient presents for preventative foot care services. No changes to ROS.   Podiatric Exam: Vascular: dorsalis pedis and posterior tibial pulses are palpable bilateral. Capillary return is immediate. Temperature gradient is WNL. Skin turgor WNL  Sensorium: Normal Semmes Weinstein monofilament test. Normal tactile sensation bilaterally. Nail Exam: Pt has thick disfigured discolored nails with subungual debris noted bilateral entire nail hallux through fifth toenails.  Ingrown toenail medial border hallux  B/L.  Ulcer Exam: There is no evidence of ulcer or pre-ulcerative changes or infection. Orthopedic Exam: Muscle tone and strength are WNL. No limitations in general ROM. No crepitus or effusions noted. HAV  B/L.  Hammer toe second right.   Skin: No Porokeratosis. No infection or ulcers  Diagnosis:  Onychomycosis, , Pain in right toe, pain in left toes,  Pincer nails   Treatment & Plan Procedures and Treatment: Consent by patient was obtained for treatment procedures. The patient understood the discussion of treatment and procedures well. All questions were answered thoroughly reviewed. Debridement of mycotic and hypertrophic toenails, 1 through 5 bilateral and clearing of subungual debris. No ulceration, no infection noted.  Return Visit-Office Procedure: Patient instructed to return to the office for a follow up visit 10 weeks  for continued evaluation and treatment.    Gardiner Barefoot DPM

## 2020-11-10 ENCOUNTER — Other Ambulatory Visit: Payer: Self-pay

## 2020-11-10 ENCOUNTER — Ambulatory Visit (INDEPENDENT_AMBULATORY_CARE_PROVIDER_SITE_OTHER): Payer: Medicare Other | Admitting: Dermatology

## 2020-11-10 DIAGNOSIS — L578 Other skin changes due to chronic exposure to nonionizing radiation: Secondary | ICD-10-CM | POA: Diagnosis not present

## 2020-11-10 DIAGNOSIS — L570 Actinic keratosis: Secondary | ICD-10-CM

## 2020-11-10 DIAGNOSIS — Z85828 Personal history of other malignant neoplasm of skin: Secondary | ICD-10-CM

## 2020-11-10 DIAGNOSIS — Z1283 Encounter for screening for malignant neoplasm of skin: Secondary | ICD-10-CM | POA: Diagnosis not present

## 2020-11-10 DIAGNOSIS — I6523 Occlusion and stenosis of bilateral carotid arteries: Secondary | ICD-10-CM

## 2020-11-10 DIAGNOSIS — D229 Melanocytic nevi, unspecified: Secondary | ICD-10-CM | POA: Diagnosis not present

## 2020-11-10 DIAGNOSIS — L821 Other seborrheic keratosis: Secondary | ICD-10-CM

## 2020-11-10 DIAGNOSIS — L820 Inflamed seborrheic keratosis: Secondary | ICD-10-CM

## 2020-11-10 DIAGNOSIS — L814 Other melanin hyperpigmentation: Secondary | ICD-10-CM

## 2020-11-10 DIAGNOSIS — E876 Hypokalemia: Secondary | ICD-10-CM | POA: Diagnosis not present

## 2020-11-10 DIAGNOSIS — R569 Unspecified convulsions: Secondary | ICD-10-CM | POA: Diagnosis not present

## 2020-11-10 DIAGNOSIS — E871 Hypo-osmolality and hyponatremia: Secondary | ICD-10-CM | POA: Diagnosis not present

## 2020-11-10 DIAGNOSIS — I2510 Atherosclerotic heart disease of native coronary artery without angina pectoris: Secondary | ICD-10-CM | POA: Diagnosis not present

## 2020-11-10 DIAGNOSIS — D1800 Hemangioma unspecified site: Secondary | ICD-10-CM

## 2020-11-10 DIAGNOSIS — I10 Essential (primary) hypertension: Secondary | ICD-10-CM | POA: Diagnosis not present

## 2020-11-10 NOTE — Patient Instructions (Signed)

## 2020-11-10 NOTE — Progress Notes (Signed)
Follow-Up Visit   Subjective  Devin Garza is a 79 y.o. male who presents for the following: Annual Exam (History of BCC and SCC - UBSE today). The patient presents for Upper Body Skin Exam (UBSE) for skin cancer screening and mole check.  The following portions of the chart were reviewed this encounter and updated as appropriate:  Tobacco  Allergies  Meds  Problems  Med Hx  Surg Hx  Fam Hx     Review of Systems:  No other skin or systemic complaints except as noted in HPI or Assessment and Plan.  Objective  Well appearing patient in no apparent distress; mood and affect are within normal limits.  All skin waist up examined.  Objective  Right temple (4): Erythematous thin papules/macules with gritty scale.   Objective  Right cheek (2): Erythematous keratotic or waxy stuck-on papule or plaque.    Assessment & Plan    History of Squamous Cell Carcinoma of the Skin - No evidence of recurrence today - No lymphadenopathy - Recommend regular full body skin exams - Recommend daily broad spectrum sunscreen SPF 30+ to sun-exposed areas, reapply every 2 hours as needed.  - Call if any new or changing lesions are noted between office visits  History of Basal Cell Carcinoma of the Skin - No evidence of recurrence today - Recommend regular full body skin exams - Recommend daily broad spectrum sunscreen SPF 30+ to sun-exposed areas, reapply every 2 hours as needed.  - Call if any new or changing lesions are noted between office visits  Lentigines - Scattered tan macules - Discussed due to sun exposure - Benign, observe - Call for any changes  Seborrheic Keratoses - Stuck-on, waxy, tan-brown papules and plaques  - Discussed benign etiology and prognosis. - Observe - Call for any changes  Melanocytic Nevi - Tan-brown and/or pink-flesh-colored symmetric macules and papules - Benign appearing on exam today - Observation - Call clinic for new or changing moles -  Recommend daily use of broad spectrum spf 30+ sunscreen to sun-exposed areas.   Hemangiomas - Red papules - Discussed benign nature - Observe - Call for any changes  Actinic Damage - Chronic, secondary to cumulative UV/sun exposure - diffuse scaly erythematous macules with underlying dyspigmentation - Recommend daily broad spectrum sunscreen SPF 30+ to sun-exposed areas, reapply every 2 hours as needed.  - Call for new or changing lesions.  Skin cancer screening performed today.  AK (actinic keratosis) (4) Right temple  Destruction of lesion - Right temple Complexity: simple   Destruction method: cryotherapy   Informed consent: discussed and consent obtained   Timeout:  patient name, date of birth, surgical site, and procedure verified Lesion destroyed using liquid nitrogen: Yes   Region frozen until ice ball extended beyond lesion: Yes   Outcome: patient tolerated procedure well with no complications   Post-procedure details: wound care instructions given    Inflamed seborrheic keratosis (2) Right cheek  Destruction of lesion - Right cheek Complexity: simple   Destruction method: cryotherapy   Informed consent: discussed and consent obtained   Timeout:  patient name, date of birth, surgical site, and procedure verified Lesion destroyed using liquid nitrogen: Yes   Region frozen until ice ball extended beyond lesion: Yes   Outcome: patient tolerated procedure well with no complications   Post-procedure details: wound care instructions given    Skin cancer screening  Return in about 6 months (around 05/10/2021) for AK follow up.  I, Ashok Cordia, CMA, am  acting as scribe for Sarina Ser, MD .  Documentation: I have reviewed the above documentation for accuracy and completeness, and I agree with the above.  Sarina Ser, MD

## 2020-11-11 ENCOUNTER — Telehealth: Payer: Self-pay

## 2020-11-11 ENCOUNTER — Ambulatory Visit: Payer: Self-pay | Admitting: Internal Medicine

## 2020-11-11 ENCOUNTER — Encounter: Payer: Self-pay | Admitting: Dermatology

## 2020-11-11 DIAGNOSIS — I10 Essential (primary) hypertension: Secondary | ICD-10-CM | POA: Diagnosis not present

## 2020-11-11 DIAGNOSIS — E871 Hypo-osmolality and hyponatremia: Secondary | ICD-10-CM | POA: Diagnosis not present

## 2020-11-11 DIAGNOSIS — E876 Hypokalemia: Secondary | ICD-10-CM | POA: Diagnosis not present

## 2020-11-11 DIAGNOSIS — R569 Unspecified convulsions: Secondary | ICD-10-CM | POA: Diagnosis not present

## 2020-11-11 DIAGNOSIS — I2510 Atherosclerotic heart disease of native coronary artery without angina pectoris: Secondary | ICD-10-CM | POA: Diagnosis not present

## 2020-11-11 NOTE — Telephone Encounter (Signed)
Left detailed message on machine for Manuela Schwartz with verbal orders.

## 2020-11-11 NOTE — Telephone Encounter (Signed)
[facility] calling requesting VO for this patient   Home Health OT  1x week 4  Voicemail can be left

## 2020-11-11 NOTE — Telephone Encounter (Signed)
Ok to order as requested 

## 2020-11-17 ENCOUNTER — Ambulatory Visit (INDEPENDENT_AMBULATORY_CARE_PROVIDER_SITE_OTHER): Payer: Medicare Other | Admitting: Orthopaedic Surgery

## 2020-11-17 ENCOUNTER — Encounter: Payer: Self-pay | Admitting: Orthopaedic Surgery

## 2020-11-17 VITALS — Ht 68.0 in | Wt 170.0 lb

## 2020-11-17 DIAGNOSIS — M25551 Pain in right hip: Secondary | ICD-10-CM | POA: Diagnosis not present

## 2020-11-17 DIAGNOSIS — I6523 Occlusion and stenosis of bilateral carotid arteries: Secondary | ICD-10-CM | POA: Diagnosis not present

## 2020-11-17 DIAGNOSIS — M25532 Pain in left wrist: Secondary | ICD-10-CM | POA: Diagnosis not present

## 2020-11-17 MED ORDER — METHYLPREDNISOLONE 4 MG PO TABS: ORAL_TABLET | ORAL | 0 refills | Status: DC

## 2020-11-17 NOTE — Progress Notes (Signed)
Office Visit Note   Patient: Devin Garza           Date of Birth: 01/22/1941           MRN: [id number] Visit Date: 11/17/2020              Requested by: Isaac Bliss, Rayford Halsted, MD [place],  [place] [address] PCP: Isaac Bliss, Rayford Halsted, MD   Assessment & Plan: Visit Diagnoses:  1. Pain in left wrist   2. Right hip pain     Plan: He is currently asymptomatic with his right hip and thus does not desire any type of intervention at all.  I agree with this based on his clinical exam.  If this worsens at all he knows to let us know.  From his wrist standpoint, I have recommended topical Voltaren gel and at least trying a 6-day steroid taper.  If he continues to be symptomatic with his left wrist he will let us know because I would see him in the office to provide a steroid injection in the left wrist.  All questions and concerns were answered and addressed.  Follow-Up Instructions: Return in about 4 weeks (around 12/15/2020).   Orders:  No orders of the defined types were placed in this encounter.  Meds ordered this encounter  Medications   methylPREDNISolone (MEDROL) 4 MG tablet    Sig: Medrol dose pack. Take as instructed    Dispense:  21 tablet    Refill:  0      Procedures: No procedures performed   Clinical Data: No additional findings.   Subjective: Chief Complaint  Patient presents with   Right Hip - Pain  The patient is someone I am seeing for the first time.  This is mainly for right hip pain.  He had a hard fall a few weeks ago landing on his right hip and has a lot of pain after that.  He comes in today ambulate with a cane saying that his hip really does not bother him at all.  He understands that x-rays showed cystic changes in the femoral head consistent with osteoarthritis and potentially a touch of osteonecrosis but he is pain-free now.  He says both his wrists have been sore since then and his left wrist is still swollen.  He  thinks he injured that when he fell as well.  He ambulates with a cane.  He said he is not interested in any type of surgery at all and feels like he is too old to have any type of surgery.  He cannot take anti-inflammatories due to previous GI bleeding.  He is not a diabetic.  HPI  Review of Systems He currently denies a headache, chest pain, shortness of breath, fever, chills, nausea, vomiting  Objective: Vital Signs: Ht 5\' 8"  (1.727 m)    Wt 170 lb (77.1 kg)    BMI 25.85 kg/m   Physical Exam He is alert and oriented x3 and in no acute distress Ortho Exam I am able to easily rotate his right hip with really no discomfort at all.  His left hip also moves smoothly.  His left wrist also moves smoothly but there is some slight swelling and discomfort palpating the wrist globally.  There is no deformity of the wrist otherwise.  He does have good grip and pinch strength with the left side but it is painful. Specialty Comments:  No specialty comments available.  Imaging: No results found. Plain films  and the CT scan of his right hip are reviewed.  I shared the x-rays with the patient.  There is cystic changes in the femoral head with osteoarthritis and likely a touch of osteonecrosis but the prominent finding is osteoarthritis.  There is not a lot of significant joint space narrowing.  PMFS History: Patient Active Problem List   Diagnosis Date Noted   Fall 10/16/2020   Pressure injury of skin 10/16/2020   Seizure ([place]) 10/15/2020   Hypokalemia 10/15/2020   Hypomagnesemia 10/15/2020   Hyperglycemia 10/15/2020   Right hip pain 10/15/2020   Acute urinary retention 10/15/2020   History of alcohol abuse 10/15/2020   Gout 03/21/2017   History of upper gastrointestinal bleeding 11/10/2016   Angiodysplasia of stomach and duodenum without hemorrhage    Benign neoplasm of transverse colon    Benign neoplasm of descending colon    GERD without esophagitis 05/28/2015   Tubular  adenoma of colon 05/28/2015   Prediabetes 04/21/2011   Alcohol abuse 01/21/2011   Anemia, unspecified 11/12/2009   Basal cell carcinoma of skin 09/03/2008   Carotid artery stenosis 08/28/2008   Insomnia 08/28/2008   Mixed hyperlipidemia 08/27/2008   Essential hypertension 08/27/2008   Past Medical History:  Diagnosis Date   Actinic keratosis    Anemia    Basal cell adenocarcinoma 09/03/2008   Qualifier: Diagnosis of  By: Lorelei Pont MD, Frederico Hamman    Overview:  Overview:  Qualifier: Diagnosis of  By: Lorelei Pont MD, [place]    Basal cell carcinoma 11/06/2008   Right mastoid/neck.    Basal cell carcinoma 11/13/2008   Left lateral nose medial infraorbital ~2cm lat. to med. canthus. Excised 01/08/2009, margins free.   Basal cell carcinoma 10/09/2009   Right upper back, paraspinal inf. to base of neck. Excised 12/03/2009   Basal cell carcinoma 06/11/2013   Left lateral forehead. Excised 07/18/2013, margins free.   Basal cell carcinoma 07/30/2015   Right nasal ala. Nodular pattern.   Basal cell carcinoma 07/30/2015   Right mid to sup. helix. Nodular.   Basal cell carcinoma 08/25/2016   Right mid to sup. helix. Superficial with focal infiltration. Excised 09/28/2016, margins free.   Basal cell carcinoma 03/16/2017   Right mid to sup. helix. Mixed pattern, ulcerated   Basal cell carcinoma 05/10/2018   Right nasal tip. BCC with focal sclerosis.   Basal cell carcinoma of lip 2007   multiple sites   Blood transfusion without reported diagnosis    ED (erectile dysfunction)    GERD (gastroesophageal reflux disease)    HLD (hyperlipidemia)    Hx of squamous cell carcinoma of skin 08/25/2016   L superior pectoral   Hypertension    Osteoarthritis    Peripheral vascular disease ([place])    Occlusion and Stenosis of Carotid Artery   Squamous cell carcinoma of skin 08/25/2016   Left superior pectoral. KA pattern. EDC.   Tobacco abuse    Tubular adenoma of colon 2005    Wears dentures    full upper and lower    Family History  Problem Relation Age of Onset   Lung cancer Mother 24   Hearing loss Father    Throat cancer Father    Heart attack Father    CAD Father    Ovarian cancer Sister     Past Surgical History:  Procedure Laterality Date   ANKLE ARTHROSCOPY W/ OPEN REPAIR Left    COLONOSCOPY  2006   [place]-Tubular adenoma   COLONOSCOPY N/A 07/23/2015   Procedure: COLONOSCOPY;  Surgeon: Lucilla Lame, MD;  Location: [facility];  Service: Gastroenterology;  Laterality: N/A;  WITH BIOPSIES---- TRANSVERSE COLON POLYP DESCENDING COLON POLYP  X  4   ESOPHAGOGASTRODUODENOSCOPY N/A 07/23/2015   Procedure: ESOPHAGOGASTRODUODENOSCOPY (EGD);  Surgeon: Lucilla Lame, MD;  Location: [facility];  Service: Gastroenterology;  Laterality: N/A;  WITH BIOPSY--- DUODENAL BIOPSY GASTRIC BIOPSY   ESOPHAGOGASTRODUODENOSCOPY N/A 10/28/2016   Procedure: ESOPHAGOGASTRODUODENOSCOPY (EGD);  Surgeon: Manya Silvas, MD;  Location: [facility] ENDOSCOPY;  Service: Endoscopy;  Laterality: N/A;   ESOPHAGOGASTRODUODENOSCOPY (EGD) WITH PROPOFOL N/A 09/02/2017   Procedure: ESOPHAGOGASTRODUODENOSCOPY (EGD) WITH PROPOFOL;  Surgeon: Manya Silvas, MD;  Location: [facility] ENDOSCOPY;  Service: Endoscopy;  Laterality: N/A;   SKIN CANCER EXCISION     LIP   TONSILLECTOMY     age 56   UPPER GI ENDOSCOPY  12/2009   Dr. Ivory Broad gastritisl negative for H. pylori; duodenal biopsies neg for sprue   Social History   Occupational History   Occupation: retired Building surveyor: RETIRED  Tobacco Use   Smoking status: Former Smoker    Packs/day: 0.50    Years: 40.00    Pack years: 20.00    Types: Cigarettes    Quit date: 03/10/2020    Years since quitting: 0.6   Smokeless tobacco: Never Used   Tobacco comment: cigars occassionally  Vaping Use   Vaping Use: Never used  Substance and Sexual Activity   Alcohol use: Yes    Alcohol/week: 14.0  standard drinks    Types: 14 Glasses of wine per week    Comment: 2 glasses of wine daily   Drug use: No   Sexual activity: Not Currently

## 2020-11-19 ENCOUNTER — Other Ambulatory Visit: Payer: Self-pay

## 2020-11-19 DIAGNOSIS — E876 Hypokalemia: Secondary | ICD-10-CM | POA: Diagnosis not present

## 2020-11-19 DIAGNOSIS — I10 Essential (primary) hypertension: Secondary | ICD-10-CM | POA: Diagnosis not present

## 2020-11-19 DIAGNOSIS — I2510 Atherosclerotic heart disease of native coronary artery without angina pectoris: Secondary | ICD-10-CM | POA: Diagnosis not present

## 2020-11-19 DIAGNOSIS — R569 Unspecified convulsions: Secondary | ICD-10-CM | POA: Diagnosis not present

## 2020-11-19 DIAGNOSIS — E871 Hypo-osmolality and hyponatremia: Secondary | ICD-10-CM | POA: Diagnosis not present

## 2020-11-21 DIAGNOSIS — I10 Essential (primary) hypertension: Secondary | ICD-10-CM | POA: Diagnosis not present

## 2020-11-21 DIAGNOSIS — E871 Hypo-osmolality and hyponatremia: Secondary | ICD-10-CM | POA: Diagnosis not present

## 2020-11-21 DIAGNOSIS — I2510 Atherosclerotic heart disease of native coronary artery without angina pectoris: Secondary | ICD-10-CM | POA: Diagnosis not present

## 2020-11-21 DIAGNOSIS — R569 Unspecified convulsions: Secondary | ICD-10-CM | POA: Diagnosis not present

## 2020-11-21 DIAGNOSIS — E876 Hypokalemia: Secondary | ICD-10-CM | POA: Diagnosis not present

## 2020-11-22 ENCOUNTER — Other Ambulatory Visit: Payer: Self-pay | Admitting: Adult Health

## 2020-11-22 ENCOUNTER — Other Ambulatory Visit: Payer: Self-pay | Admitting: Internal Medicine

## 2020-11-22 DIAGNOSIS — M2550 Pain in unspecified joint: Secondary | ICD-10-CM

## 2020-11-22 DIAGNOSIS — Z8719 Personal history of other diseases of the digestive system: Secondary | ICD-10-CM

## 2020-11-24 ENCOUNTER — Other Ambulatory Visit (INDEPENDENT_AMBULATORY_CARE_PROVIDER_SITE_OTHER): Payer: Medicare Other

## 2020-11-24 ENCOUNTER — Other Ambulatory Visit: Payer: Self-pay

## 2020-11-24 DIAGNOSIS — E782 Mixed hyperlipidemia: Secondary | ICD-10-CM | POA: Diagnosis not present

## 2020-11-24 DIAGNOSIS — Z9181 History of falling: Secondary | ICD-10-CM | POA: Diagnosis not present

## 2020-11-24 DIAGNOSIS — F1011 Alcohol abuse, in remission: Secondary | ICD-10-CM | POA: Diagnosis not present

## 2020-11-24 DIAGNOSIS — Q2733 Arteriovenous malformation of digestive system vessel: Secondary | ICD-10-CM | POA: Diagnosis not present

## 2020-11-24 DIAGNOSIS — D509 Iron deficiency anemia, unspecified: Secondary | ICD-10-CM

## 2020-11-24 DIAGNOSIS — F5101 Primary insomnia: Secondary | ICD-10-CM | POA: Diagnosis not present

## 2020-11-24 DIAGNOSIS — K219 Gastro-esophageal reflux disease without esophagitis: Secondary | ICD-10-CM | POA: Diagnosis not present

## 2020-11-24 DIAGNOSIS — E871 Hypo-osmolality and hyponatremia: Secondary | ICD-10-CM | POA: Diagnosis not present

## 2020-11-24 DIAGNOSIS — I10 Essential (primary) hypertension: Secondary | ICD-10-CM | POA: Diagnosis not present

## 2020-11-24 DIAGNOSIS — I739 Peripheral vascular disease, unspecified: Secondary | ICD-10-CM | POA: Diagnosis not present

## 2020-11-24 DIAGNOSIS — M1990 Unspecified osteoarthritis, unspecified site: Secondary | ICD-10-CM | POA: Diagnosis not present

## 2020-11-24 DIAGNOSIS — E876 Hypokalemia: Secondary | ICD-10-CM | POA: Diagnosis not present

## 2020-11-24 DIAGNOSIS — Z85828 Personal history of other malignant neoplasm of skin: Secondary | ICD-10-CM | POA: Diagnosis not present

## 2020-11-24 DIAGNOSIS — R790 Abnormal level of blood mineral: Secondary | ICD-10-CM

## 2020-11-24 DIAGNOSIS — Z87891 Personal history of nicotine dependence: Secondary | ICD-10-CM | POA: Diagnosis not present

## 2020-11-24 DIAGNOSIS — D126 Benign neoplasm of colon, unspecified: Secondary | ICD-10-CM | POA: Diagnosis not present

## 2020-11-24 DIAGNOSIS — N529 Male erectile dysfunction, unspecified: Secondary | ICD-10-CM | POA: Diagnosis not present

## 2020-11-24 DIAGNOSIS — R569 Unspecified convulsions: Secondary | ICD-10-CM | POA: Diagnosis not present

## 2020-11-24 DIAGNOSIS — Z9089 Acquired absence of other organs: Secondary | ICD-10-CM | POA: Diagnosis not present

## 2020-11-24 DIAGNOSIS — F339 Major depressive disorder, recurrent, unspecified: Secondary | ICD-10-CM | POA: Diagnosis not present

## 2020-11-24 DIAGNOSIS — I69828 Other speech and language deficits following other cerebrovascular disease: Secondary | ICD-10-CM | POA: Diagnosis not present

## 2020-11-24 DIAGNOSIS — R7303 Prediabetes: Secondary | ICD-10-CM | POA: Diagnosis not present

## 2020-11-24 DIAGNOSIS — I2510 Atherosclerotic heart disease of native coronary artery without angina pectoris: Secondary | ICD-10-CM | POA: Diagnosis not present

## 2020-11-24 LAB — CBC WITH DIFFERENTIAL/PLATELET
Basophils Absolute: 0.1 10*3/uL (ref 0.0–0.1)
Basophils Relative: 1.2 % (ref 0.0–3.0)
Eosinophils Absolute: 0.2 10*3/uL (ref 0.0–0.7)
Eosinophils Relative: 3.1 % (ref 0.0–5.0)
HCT: 37 % — ABNORMAL LOW (ref 39.0–52.0)
Hemoglobin: 12.4 g/dL — ABNORMAL LOW (ref 13.0–17.0)
Lymphocytes Relative: 28.5 % (ref 12.0–46.0)
Lymphs Abs: 1.9 10*3/uL (ref 0.7–4.0)
MCHC: 33.5 g/dL (ref 30.0–36.0)
MCV: 95 fl (ref 78.0–100.0)
Monocytes Absolute: 0.9 10*3/uL (ref 0.1–1.0)
Monocytes Relative: 13.5 % — ABNORMAL HIGH (ref 3.0–12.0)
Neutro Abs: 3.7 10*3/uL (ref 1.4–7.7)
Neutrophils Relative %: 53.7 % (ref 43.0–77.0)
Platelets: 332 10*3/uL (ref 150.0–400.0)
RBC: 3.89 Mil/uL — ABNORMAL LOW (ref 4.22–5.81)
RDW: 15 % (ref 11.5–15.5)
WBC: 6.8 10*3/uL (ref 4.0–10.5)

## 2020-11-24 LAB — MAGNESIUM: Magnesium: 1.1 mg/dL — ABNORMAL LOW (ref 1.5–2.5)

## 2020-11-24 NOTE — Addendum Note (Signed)
Addended by: Marrion Coy on: 11/24/2020 08:39 AM   Modules accepted: Orders

## 2020-11-24 NOTE — Addendum Note (Signed)
Addended by: Marrion Coy on: 11/24/2020 08:37 AM   Modules accepted: Orders

## 2020-11-26 ENCOUNTER — Telehealth: Payer: Self-pay | Admitting: Internal Medicine

## 2020-11-26 DIAGNOSIS — E871 Hypo-osmolality and hyponatremia: Secondary | ICD-10-CM | POA: Diagnosis not present

## 2020-11-26 DIAGNOSIS — R569 Unspecified convulsions: Secondary | ICD-10-CM | POA: Diagnosis not present

## 2020-11-26 DIAGNOSIS — E876 Hypokalemia: Secondary | ICD-10-CM | POA: Diagnosis not present

## 2020-11-26 DIAGNOSIS — I10 Essential (primary) hypertension: Secondary | ICD-10-CM | POA: Diagnosis not present

## 2020-11-26 DIAGNOSIS — I2510 Atherosclerotic heart disease of native coronary artery without angina pectoris: Secondary | ICD-10-CM | POA: Diagnosis not present

## 2020-11-26 NOTE — Telephone Encounter (Signed)
I am ok with PT orders

## 2020-11-26 NOTE — Telephone Encounter (Signed)
Ok to order as requested 

## 2020-11-26 NOTE — Telephone Encounter (Signed)
[place] needs verbal orders for PT  1 x a week for 3 weeks   To work on stairs and unlevel balance

## 2020-11-26 NOTE — Telephone Encounter (Signed)
Attempted to call but could not leave a message

## 2020-11-26 NOTE — Telephone Encounter (Signed)
Left message on machine on confidential voicemail with verbal orders for PT.

## 2020-11-26 NOTE — Telephone Encounter (Signed)
Devin Garza from [facility] call and stated she need a verbal order for speech therapy for once a week for 3 wks starting on 10/27/20.you can call her back at [phone].

## 2020-11-27 DIAGNOSIS — I2510 Atherosclerotic heart disease of native coronary artery without angina pectoris: Secondary | ICD-10-CM | POA: Diagnosis not present

## 2020-11-27 DIAGNOSIS — E871 Hypo-osmolality and hyponatremia: Secondary | ICD-10-CM | POA: Diagnosis not present

## 2020-11-27 DIAGNOSIS — R569 Unspecified convulsions: Secondary | ICD-10-CM | POA: Diagnosis not present

## 2020-11-27 DIAGNOSIS — I10 Essential (primary) hypertension: Secondary | ICD-10-CM | POA: Diagnosis not present

## 2020-11-27 DIAGNOSIS — E876 Hypokalemia: Secondary | ICD-10-CM | POA: Diagnosis not present

## 2020-11-28 ENCOUNTER — Other Ambulatory Visit: Payer: Self-pay | Admitting: Internal Medicine

## 2020-11-28 DIAGNOSIS — R790 Abnormal level of blood mineral: Secondary | ICD-10-CM

## 2020-11-28 NOTE — Telephone Encounter (Signed)
Attempted to call but can not leave a message.

## 2020-12-02 DIAGNOSIS — R569 Unspecified convulsions: Secondary | ICD-10-CM | POA: Diagnosis not present

## 2020-12-02 DIAGNOSIS — E871 Hypo-osmolality and hyponatremia: Secondary | ICD-10-CM | POA: Diagnosis not present

## 2020-12-02 DIAGNOSIS — I2510 Atherosclerotic heart disease of native coronary artery without angina pectoris: Secondary | ICD-10-CM | POA: Diagnosis not present

## 2020-12-02 DIAGNOSIS — I10 Essential (primary) hypertension: Secondary | ICD-10-CM | POA: Diagnosis not present

## 2020-12-02 DIAGNOSIS — E876 Hypokalemia: Secondary | ICD-10-CM | POA: Diagnosis not present

## 2020-12-03 ENCOUNTER — Telehealth: Payer: Self-pay | Admitting: Internal Medicine

## 2020-12-03 NOTE — Telephone Encounter (Signed)
Patient missed visit this week. He is not feeling well, he has a cough and sneezing. He is already schedule for a visit next week.  FYI

## 2020-12-04 ENCOUNTER — Other Ambulatory Visit: Payer: Self-pay | Admitting: Internal Medicine

## 2020-12-04 DIAGNOSIS — I10 Essential (primary) hypertension: Secondary | ICD-10-CM

## 2020-12-05 ENCOUNTER — Telehealth: Payer: Self-pay | Admitting: Internal Medicine

## 2020-12-05 NOTE — Telephone Encounter (Signed)
Devin Garza is calling in to get verbal orders for Home OT 1 weeks 3. (pt is making progress but had not met his goals the reason for extension).  May leave detail msg on secured voice mail.

## 2020-12-05 NOTE — Telephone Encounter (Signed)
Left detailed message on machine for Manuela Schwartz with verbal orders for OT

## 2020-12-05 NOTE — Telephone Encounter (Signed)
OK for orders as requested 

## 2020-12-09 ENCOUNTER — Other Ambulatory Visit: Payer: Self-pay | Admitting: Internal Medicine

## 2020-12-09 DIAGNOSIS — M2550 Pain in unspecified joint: Secondary | ICD-10-CM

## 2020-12-09 DIAGNOSIS — I2510 Atherosclerotic heart disease of native coronary artery without angina pectoris: Secondary | ICD-10-CM | POA: Diagnosis not present

## 2020-12-09 DIAGNOSIS — E876 Hypokalemia: Secondary | ICD-10-CM | POA: Diagnosis not present

## 2020-12-09 DIAGNOSIS — R569 Unspecified convulsions: Secondary | ICD-10-CM | POA: Diagnosis not present

## 2020-12-09 DIAGNOSIS — I10 Essential (primary) hypertension: Secondary | ICD-10-CM | POA: Diagnosis not present

## 2020-12-09 DIAGNOSIS — E871 Hypo-osmolality and hyponatremia: Secondary | ICD-10-CM | POA: Diagnosis not present

## 2020-12-10 DIAGNOSIS — E876 Hypokalemia: Secondary | ICD-10-CM | POA: Diagnosis not present

## 2020-12-10 DIAGNOSIS — R569 Unspecified convulsions: Secondary | ICD-10-CM | POA: Diagnosis not present

## 2020-12-10 DIAGNOSIS — I10 Essential (primary) hypertension: Secondary | ICD-10-CM | POA: Diagnosis not present

## 2020-12-10 DIAGNOSIS — I2510 Atherosclerotic heart disease of native coronary artery without angina pectoris: Secondary | ICD-10-CM | POA: Diagnosis not present

## 2020-12-10 DIAGNOSIS — E871 Hypo-osmolality and hyponatremia: Secondary | ICD-10-CM | POA: Diagnosis not present

## 2020-12-11 ENCOUNTER — Telehealth: Payer: Self-pay | Admitting: Orthopaedic Surgery

## 2020-12-11 ENCOUNTER — Other Ambulatory Visit: Payer: Self-pay | Admitting: Adult Health

## 2020-12-11 DIAGNOSIS — F1011 Alcohol abuse, in remission: Secondary | ICD-10-CM

## 2020-12-11 NOTE — Telephone Encounter (Signed)
Patient called stating he is returning Dr. Trevor Mace phone call. Please call patient at [phone].

## 2020-12-12 ENCOUNTER — Other Ambulatory Visit: Payer: Self-pay

## 2020-12-12 ENCOUNTER — Other Ambulatory Visit: Payer: Medicare Other

## 2020-12-12 DIAGNOSIS — R790 Abnormal level of blood mineral: Secondary | ICD-10-CM

## 2020-12-13 ENCOUNTER — Telehealth: Payer: Self-pay | Admitting: Family Medicine

## 2020-12-13 LAB — MAGNESIUM: Magnesium: 0.9 mg/dL — CL (ref 1.5–2.5)

## 2020-12-13 NOTE — Telephone Encounter (Signed)
Spoke with pt to discuss lab result and inquire about symptoms. He denies any headache,MS changes,palpitation,CP,SOB,abdominal pain, N/V,diarrhea, tremor,cramps,acute urinary symptoms, syncope,or seizure like symptoms.  He is not sure if he is taking Mg supplementation, "I am taking all" medications that have been prescribed. He is upset because I am calling this late, this is "unaceptable." I apologized, explained reason for call,edcuated about possible complications, and instructed about warning signs. He voices understanding.  Devin Stroble [place], MD

## 2020-12-13 NOTE — Telephone Encounter (Signed)
On call services received a critical lab result. Mg level is 0.9. Hx of hypomg++ as well as other electrolyte abnormalities. Mg has been as low as 0.6 on 11/05/20. According to records, pt was started on Mg Oxide 400 mg bid.  Lab Results  Component Value Date   CREATININE 0.88 10/31/2020   BUN 22 10/31/2020   NA 136 10/31/2020   K 4.9 10/31/2020   CL 98 10/31/2020   CO2 27 10/31/2020   Will route note to PCP.   Rayli Wiederhold [place], MD

## 2020-12-18 DIAGNOSIS — I2510 Atherosclerotic heart disease of native coronary artery without angina pectoris: Secondary | ICD-10-CM | POA: Diagnosis not present

## 2020-12-18 DIAGNOSIS — I10 Essential (primary) hypertension: Secondary | ICD-10-CM | POA: Diagnosis not present

## 2020-12-18 DIAGNOSIS — E871 Hypo-osmolality and hyponatremia: Secondary | ICD-10-CM | POA: Diagnosis not present

## 2020-12-18 DIAGNOSIS — E876 Hypokalemia: Secondary | ICD-10-CM | POA: Diagnosis not present

## 2020-12-18 DIAGNOSIS — R569 Unspecified convulsions: Secondary | ICD-10-CM | POA: Diagnosis not present

## 2020-12-23 DIAGNOSIS — E876 Hypokalemia: Secondary | ICD-10-CM | POA: Diagnosis not present

## 2020-12-23 DIAGNOSIS — I2510 Atherosclerotic heart disease of native coronary artery without angina pectoris: Secondary | ICD-10-CM | POA: Diagnosis not present

## 2020-12-23 DIAGNOSIS — I10 Essential (primary) hypertension: Secondary | ICD-10-CM | POA: Diagnosis not present

## 2020-12-23 DIAGNOSIS — R569 Unspecified convulsions: Secondary | ICD-10-CM | POA: Diagnosis not present

## 2020-12-23 DIAGNOSIS — E871 Hypo-osmolality and hyponatremia: Secondary | ICD-10-CM | POA: Diagnosis not present

## 2020-12-24 ENCOUNTER — Other Ambulatory Visit: Payer: Self-pay | Admitting: Internal Medicine

## 2020-12-24 ENCOUNTER — Ambulatory Visit: Payer: Medicare Other | Admitting: Orthopaedic Surgery

## 2020-12-24 MED ORDER — MAGNESIUM OXIDE 400 MG PO TABS: 400.0000 mg | ORAL_TABLET | Freq: Three times a day (TID) | ORAL | 0 refills | Status: AC

## 2020-12-30 ENCOUNTER — Ambulatory Visit: Payer: Medicare Other | Admitting: Internal Medicine

## 2021-01-02 ENCOUNTER — Telehealth: Payer: Self-pay | Admitting: Internal Medicine

## 2021-01-02 DIAGNOSIS — E782 Mixed hyperlipidemia: Secondary | ICD-10-CM

## 2021-01-02 MED ORDER — ATORVASTATIN CALCIUM 10 MG PO TABS: 10.0000 mg | ORAL_TABLET | Freq: Every day | ORAL | 0 refills | Status: DC

## 2021-01-02 NOTE — Telephone Encounter (Signed)
pt need an rx for atorvastatin (LIPITOR) 10 MG tablet 90 refill with 3 refills  [place], [place]  Phone:  [phone] Fax:  [phone]

## 2021-01-02 NOTE — Telephone Encounter (Signed)
Rx sent 

## 2021-01-07 ENCOUNTER — Ambulatory Visit: Payer: Self-pay

## 2021-01-07 ENCOUNTER — Encounter: Payer: Self-pay | Admitting: Internal Medicine

## 2021-01-07 ENCOUNTER — Ambulatory Visit (INDEPENDENT_AMBULATORY_CARE_PROVIDER_SITE_OTHER): Payer: Medicare Other | Admitting: Orthopaedic Surgery

## 2021-01-07 ENCOUNTER — Other Ambulatory Visit: Payer: Self-pay

## 2021-01-07 ENCOUNTER — Ambulatory Visit (INDEPENDENT_AMBULATORY_CARE_PROVIDER_SITE_OTHER): Payer: Medicare Other | Admitting: Internal Medicine

## 2021-01-07 ENCOUNTER — Ambulatory Visit (INDEPENDENT_AMBULATORY_CARE_PROVIDER_SITE_OTHER): Payer: Medicare Other

## 2021-01-07 ENCOUNTER — Encounter: Payer: Self-pay | Admitting: Orthopaedic Surgery

## 2021-01-07 VITALS — BP 160/90 | HR 97 | Temp 98.2°F | Wt 170.9 lb

## 2021-01-07 DIAGNOSIS — M25551 Pain in right hip: Secondary | ICD-10-CM

## 2021-01-07 DIAGNOSIS — M25531 Pain in right wrist: Secondary | ICD-10-CM

## 2021-01-07 DIAGNOSIS — M25532 Pain in left wrist: Secondary | ICD-10-CM

## 2021-01-07 DIAGNOSIS — F5101 Primary insomnia: Secondary | ICD-10-CM

## 2021-01-07 DIAGNOSIS — M19131 Post-traumatic osteoarthritis, right wrist: Secondary | ICD-10-CM | POA: Diagnosis not present

## 2021-01-07 DIAGNOSIS — M19132 Post-traumatic osteoarthritis, left wrist: Secondary | ICD-10-CM

## 2021-01-07 MED ORDER — METHYLPREDNISOLONE ACETATE 40 MG/ML IJ SUSP
40.0000 mg | INTRAMUSCULAR | Status: AC | PRN
Start: 2021-01-07 — End: 2021-01-07
  Administered 2021-01-07: 40 mg via INTRA_ARTICULAR

## 2021-01-07 MED ORDER — LIDOCAINE HCL 1 % IJ SOLN
1.0000 mL | INTRAMUSCULAR | Status: AC | PRN
  Administered 2021-01-07: 1 mL

## 2021-01-07 MED ORDER — ESZOPICLONE 2 MG PO TABS
2.0000 mg | ORAL_TABLET | Freq: Every evening | ORAL | 1 refills | Status: DC | PRN
Start: 2021-01-07 — End: 2021-01-23

## 2021-01-07 NOTE — Progress Notes (Signed)
Established Patient Office Visit     This visit occurred during the SARS-CoV-2 public health emergency.  Safety protocols were in place, including screening questions prior to the visit, additional usage of staff PPE, and extensive cleaning of exam room while observing appropriate contact time as indicated for disinfecting solutions.    CC/Reason for Visit: Discuss some acute concerns  HPI: Devin Garza is a 79 y.o. male who is coming in today for the above mentioned reasons.  He has scheduled this visit to discuss 3 issues:  1.  He is requesting referral to physical therapy for his hip pain.  2.  He has requested refills of his Devin Garza that he takes for sleep.  3.  He wanted to discuss his lab results.  He has been having significant hyponatremia that has been difficult to correct.  He has a history of alcohol use.   Past Medical/Surgical History: Past Medical History:  Diagnosis Date  . Actinic keratosis   . Anemia   . Basal cell adenocarcinoma 09/03/2008   Qualifier: Diagnosis of  By: Lorelei Pont MD, Frederico Hamman    Overview:  Overview:  Qualifier: Diagnosis of  By: Lorelei Pont MD, Frederico Hamman   . Basal cell carcinoma 11/06/2008   Right mastoid/neck.   . Basal cell carcinoma 11/13/2008   Left lateral nose medial infraorbital ~2cm lat. to med. canthus. Excised 01/08/2009, margins free.  . Basal cell carcinoma 10/09/2009   Right upper back, paraspinal inf. to base of neck. Excised 12/03/2009  . Basal cell carcinoma 06/11/2013   Left lateral forehead. Excised 07/18/2013, margins free.  . Basal cell carcinoma 07/30/2015   Right nasal ala. Nodular pattern.  . Basal cell carcinoma 07/30/2015   Right mid to sup. helix. Nodular.  . Basal cell carcinoma 08/25/2016   Right mid to sup. helix. Superficial with focal infiltration. Excised 09/28/2016, margins free.  . Basal cell carcinoma 03/16/2017   Right mid to sup. helix. Mixed pattern, ulcerated  . Basal cell carcinoma 05/10/2018   Right nasal  tip. BCC with focal sclerosis.  . Basal cell carcinoma of lip 2007   multiple sites  . Blood transfusion without reported diagnosis   . ED (erectile dysfunction)   . GERD (gastroesophageal reflux disease)   . HLD (hyperlipidemia)   . Hx of squamous cell carcinoma of skin 08/25/2016   L superior pectoral  . Hypertension   . Osteoarthritis   . Peripheral vascular disease ([place])    Occlusion and Stenosis of Carotid Artery  . Squamous cell carcinoma of skin 08/25/2016   Left superior pectoral. KA pattern. EDC.  . Tobacco abuse   . Tubular adenoma of colon 2005  . Wears dentures    full upper and lower    Past Surgical History:  Procedure Laterality Date  . ANKLE ARTHROSCOPY W/ OPEN REPAIR Left   . COLONOSCOPY  2006   [place]-Tubular adenoma  . COLONOSCOPY N/A 07/23/2015   Procedure: COLONOSCOPY;  Surgeon: Lucilla Lame, MD;  Location: [facility];  Service: Gastroenterology;  Laterality: N/A;  WITH BIOPSIES---- TRANSVERSE COLON POLYP DESCENDING COLON POLYP  X  4  . ESOPHAGOGASTRODUODENOSCOPY N/A 07/23/2015   Procedure: ESOPHAGOGASTRODUODENOSCOPY (EGD);  Surgeon: Lucilla Lame, MD;  Location: [facility];  Service: Gastroenterology;  Laterality: N/A;  WITH BIOPSY--- DUODENAL BIOPSY GASTRIC BIOPSY  . ESOPHAGOGASTRODUODENOSCOPY N/A 10/28/2016   Procedure: ESOPHAGOGASTRODUODENOSCOPY (EGD);  Surgeon: Manya Silvas, MD;  Location: [facility] ENDOSCOPY;  Service: Endoscopy;  Laterality: N/A;  . ESOPHAGOGASTRODUODENOSCOPY (EGD) WITH PROPOFOL N/A 09/02/2017  Procedure: ESOPHAGOGASTRODUODENOSCOPY (EGD) WITH PROPOFOL;  Surgeon: Manya Silvas, MD;  Location: [facility] ENDOSCOPY;  Service: Endoscopy;  Laterality: N/A;  . SKIN CANCER EXCISION     LIP  . TONSILLECTOMY     age 74  . UPPER GI ENDOSCOPY  12/2009   Dr. Ivory Broad gastritisl negative for H. pylori; duodenal biopsies neg for sprue    Social History:  reports that he quit smoking about 9 months ago. His smoking use included  cigarettes. He has a 20.00 pack-year smoking history. He has never used smokeless tobacco. He reports current alcohol use of about 14.0 standard drinks of alcohol per week. He reports that he does not use drugs.  Allergies: No Known Allergies  Family History:  Family History  Problem Relation Age of Onset  . Lung cancer Mother 65  . Hearing loss Father   . Throat cancer Father   . Heart attack Father   . CAD Father   . Ovarian cancer Sister      Current Outpatient Medications:  .  atorvastatin (LIPITOR) 10 MG tablet, Take 1 tablet (10 mg total) by mouth daily., Disp: 90 tablet, Rfl: 0 .  buPROPion (WELLBUTRIN XL) 150 MG 24 hr tablet, Take 1 tablet (150 mg total) by mouth daily., Disp: 30 tablet, Rfl: 0 .  colchicine 0.6 MG tablet, TAKE 1 TABLET BY MOUTH 2 TIMES DAILY., Disp: 180 tablet, Rfl: 1 .  Diclofenac Sodium 1.5 % SOLN, Apply to small joints up to 4 times a day as needed, Disp: 450 mL, Rfl: 5 .  DIOVAN HCT 320-12.5 MG tablet, TAKE 1 TABLET DAILY, Disp: 90 tablet, Rfl: 1 .  esomeprazole (NEXIUM 24HR) 20 MG capsule, Take 2 capsules (40 mg total) by mouth 2 (two) times daily before a meal., Disp: 120 capsule, Rfl: 0 .  loratadine (CLARITIN) 10 MG tablet, Take 1 tablet (10 mg total) by mouth daily as needed for allergies., Disp: 30 tablet, Rfl: 0 .  magnesium oxide (MAG-OX) 400 MG tablet, Take 1 tablet (400 mg total) by mouth in the morning, at noon, and at bedtime for 21 days., Disp: 63 tablet, Rfl: 0 .  methylPREDNISolone (MEDROL) 4 MG tablet, Medrol dose pack. Take as instructed, Disp: 21 tablet, Rfl: 0 .  metoprolol succinate (TOPROL-XL) 50 MG 24 hr tablet, TAKE 1 TABLET BY MOUTH DAILY. TAKE WITH OR IMMEDIATELY FOLLOWING A MEAL., Disp: 90 tablet, Rfl: 1 .  Multiple Vitamin (MULTIVITAMIN) tablet, Take 1 tablet by mouth daily., Disp: , Rfl:  .  sodium chloride 1 g tablet, Take 1 tablet (1 g total) by mouth 3 (three) times daily with meals., Disp: 90 tablet, Rfl: 0 .  thiamine 100 MG  tablet, Take 1 tablet (100 mg total) by mouth daily., Disp: 30 tablet, Rfl: 0 .  eszopiclone (LUNESTA) 2 MG TABS tablet, Take 1 tablet (2 mg total) by mouth at bedtime as needed for sleep. Take immediately before bedtime, Disp: 36 tablet, Rfl: 1  Review of Systems:  Constitutional: Denies fever, chills, diaphoresis, appetite change and fatigue.  HEENT: Denies photophobia, eye pain, redness, hearing loss, ear pain, congestion, sore throat, rhinorrhea, sneezing, mouth sores, trouble swallowing, neck pain, neck stiffness and tinnitus.   Respiratory: Denies SOB, DOE, cough, chest tightness,  and wheezing.   Cardiovascular: Denies chest pain, palpitations and leg swelling.  Gastrointestinal: Denies nausea, vomiting, abdominal pain, diarrhea, constipation, blood in stool and abdominal distention.  Genitourinary: Denies dysuria, urgency, frequency, hematuria, flank pain and difficulty urinating.  Endocrine: Denies: hot or  cold intolerance, sweats, changes in hair or nails, polyuria, polydipsia. Musculoskeletal: Denies myalgias, back pain, joint swelling.  Skin: Denies pallor, rash and wound.  Neurological: Denies dizziness, seizures, syncope, weakness, light-headedness, numbness and headaches.  Hematological: Denies adenopathy. Easy bruising, personal or family bleeding history  Psychiatric/Behavioral: Denies suicidal ideation, mood changes, confusion, nervousness, sleep disturbance and agitation    Physical Exam: Vitals:   01/07/21 1457  BP: (!) 160/90  Pulse: 97  Temp: 98.2 F (36.8 C)  TempSrc: Oral  SpO2: (!) 65%  Weight: 170 lb 14.4 oz (77.5 kg)    Body mass index is 25.99 kg/m.   Constitutional: NAD, calm, comfortable, ambulates with a cane Eyes: PERRL, lids and conjunctivae normal, wears corrective lenses ENMT: Mucous membranes are moist.  Respiratory: clear to auscultation bilaterally, no wheezing, no crackles. Normal respiratory effort. No accessory muscle use.   Cardiovascular: Regular rate and rhythm, no murmurs / rubs / gallops. No extremity edema.   Psychiatric: Normal judgment and insight. Alert and oriented x 3. Normal mood.    Impression and Plan:  Right hip pain  - Plan: Ambulatory referral to Physical Therapy  Primary insomnia  - Plan: eszopiclone (LUNESTA) 2 MG TABS tablet  Hypomagnesemia  -Have advised that with severe electrolyte depletion it may take some time for electrolytes to reequilibrate. -Have advised that he continue taking magnesium oxide 3 times a day as prescribed, and that he return at the end of the month as scheduled for repeat basic metabolic profile and magnesium levels.     Lelon Frohlich, MD [facility]

## 2021-01-07 NOTE — Progress Notes (Signed)
Office Visit Note   Patient: Devin Garza           Date of Birth: 1941-03-31           MRN: [id number] Visit Date: 01/07/2021              Requested by: Isaac Bliss, Rayford Halsted, MD [place],  [place] [id number] PCP: Isaac Bliss, Rayford Halsted, MD   Assessment & Plan: Visit Diagnoses:  1. Pain in left wrist   2. Pain in right wrist   3. Slac (scapholunate advanced collapse) of wrist, left   4. Slac (scapholunate advanced collapse) of wrist, right     Plan: I went over the patient's x-rays with him in detail and tried to describe the issue he has with both his wrist.  He has significant ulnar negative variance with both wrists and significant scapholunate widening of both wrist that is chronic with both wrist.  Certainly mechanical fall can aggravate chronic changes that he has in his wrist but I reassured him there was no fractures.  Unfortunately there is really not an intervention that I would recommend for either wrist other than sending him to a hand specialist to consider potentially even proximal row carpectomy's if it is a pain issue.  We did talk about trying at least a steroid injection in his left wrist and he agreed to this.  I explained the risk and benefits of steroid injections and placed a steroid injection in his left radiocarpal joint.  I can see him back in 4 weeks to see if he has benefited from this.  All questions and concerns were answered and addressed.  Follow-Up Instructions: Return in about 4 weeks (around 02/04/2021).   Orders:  Orders Placed This Encounter  Procedures  . Medium Joint Inj  . XR Wrist Complete Right  . XR Wrist Complete Left   No orders of the defined types were placed in this encounter.     Procedures: Medium Joint Inj: L radiocarpal on 01/07/2021 9:42 AM Medications: 1 mL lidocaine 1 %; 40 mg methylPREDNISolone acetate 40 MG/ML      Clinical Data: No additional findings.   Subjective: Chief Complaint   Patient presents with  . Left Wrist - Pain  . Right Wrist - Pain  The patient is an 80 year old gentleman who comes in today for evaluation and treatment of bilateral wrist pain with the left worse than right.  This is beginning worse for several months.  Has had some swelling of both wrists.  This worsened after mechanical fall.  I actually saw him several weeks back as a referral for his hip due to cystic changes in his hip.  He says that 80 years old he is not a candidate for any type of surgery.  He felt like I had to rush through his last visit with him although my note was very thorough.  He even thought I was talking way too fast.  I called him later to give him reassurance that my patients are valuable to me and that I apologize for him feeling like we were rushed.  He has currently been going to physical therapy due to balance and coordination issues.  He does ambulate with a cane.  He has a chronic smoking history for 60 years but has been away from smoking for a long period of time.  He has never had surgery on his wrist  HPI  Review of Systems There is currently no illicit  headache, chest pain, shortness of breath, fever, chills, nausea, vomiting  Objective: Vital Signs: There were no vitals taken for this visit.  Physical Exam He is alert and oriented x3 and in no acute distress Ortho Exam Examination of both wrists shows pain with dorsiflexion of the wrist.  There is slight decrease in motion with dorsiflexion of the left wrist when comparing the left and right wrist.  His supination and pronation are full but he has a harder time with supination on the right wrist.  He has a weak pinch and grip strength bilaterally.  There is no muscle atrophy with either hand.  The sensation is normal and the perfusion is normal of both hands and wrist. Specialty Comments:  No specialty comments available.  Imaging: XR Wrist Complete Left  Result Date: 01/07/2021 X-rays of the left wrist show  chronic scapholunate widening with evidence of a advanced collapse due to dissociation of the scapholunate ligament.  There is significant ulnar negative variance.  There is no evidence of fracture or acute injury.  The findings of the left wrist mirror the right wrist.  XR Wrist Complete Right  Result Date: 01/07/2021 X-rays of the right wrist show chronic ulnar negative variance and a chronic scapholunate widening with dorsal displacement of the lunate.  The patient has the exact same findings on the left wrist.    PMFS History: Patient Active Problem List   Diagnosis Date Noted  . Fall 10/16/2020  . Pressure injury of skin 10/16/2020  . Seizure ([place]) 10/15/2020  . Hypokalemia 10/15/2020  . Hypomagnesemia 10/15/2020  . Hyperglycemia 10/15/2020  . Right hip pain 10/15/2020  . Acute urinary retention 10/15/2020  . History of alcohol abuse 10/15/2020  . Gout 03/21/2017  . History of upper gastrointestinal bleeding 11/10/2016  . Angiodysplasia of stomach and duodenum without hemorrhage   . Benign neoplasm of transverse colon   . Benign neoplasm of descending colon   . GERD without esophagitis 05/28/2015  . Tubular adenoma of colon 05/28/2015  . Prediabetes 04/21/2011  . Alcohol abuse 01/21/2011  . Anemia, unspecified 11/12/2009  . Basal cell carcinoma of skin 09/03/2008  . Carotid artery stenosis 08/28/2008  . Insomnia 08/28/2008  . Mixed hyperlipidemia 08/27/2008  . Essential hypertension 08/27/2008   Past Medical History:  Diagnosis Date  . Actinic keratosis   . Anemia   . Basal cell adenocarcinoma 09/03/2008   Qualifier: Diagnosis of  By: Lorelei Pont MD, Frederico Hamman    Overview:  Overview:  Qualifier: Diagnosis of  By: Lorelei Pont MD, Frederico Hamman   . Basal cell carcinoma 11/06/2008   Right mastoid/neck.   . Basal cell carcinoma 11/13/2008   Left lateral nose medial infraorbital ~2cm lat. to med. canthus. Excised 01/08/2009, margins free.  . Basal cell carcinoma 10/09/2009   Right upper  back, paraspinal inf. to base of neck. Excised 12/03/2009  . Basal cell carcinoma 06/11/2013   Left lateral forehead. Excised 07/18/2013, margins free.  . Basal cell carcinoma 07/30/2015   Right nasal ala. Nodular pattern.  . Basal cell carcinoma 07/30/2015   Right mid to sup. helix. Nodular.  . Basal cell carcinoma 08/25/2016   Right mid to sup. helix. Superficial with focal infiltration. Excised 09/28/2016, margins free.  . Basal cell carcinoma 03/16/2017   Right mid to sup. helix. Mixed pattern, ulcerated  . Basal cell carcinoma 05/10/2018   Right nasal tip. BCC with focal sclerosis.  . Basal cell carcinoma of lip 2007   multiple sites  . Blood transfusion without reported  diagnosis   . ED (erectile dysfunction)   . GERD (gastroesophageal reflux disease)   . HLD (hyperlipidemia)   . Hx of squamous cell carcinoma of skin 08/25/2016   L superior pectoral  . Hypertension   . Osteoarthritis   . Peripheral vascular disease ([place])    Occlusion and Stenosis of Carotid Artery  . Squamous cell carcinoma of skin 08/25/2016   Left superior pectoral. KA pattern. EDC.  . Tobacco abuse   . Tubular adenoma of colon 2005  . Wears dentures    full upper and lower    Family History  Problem Relation Age of Onset  . Lung cancer Mother 40  . Hearing loss Father   . Throat cancer Father   . Heart attack Father   . CAD Father   . Ovarian cancer Sister     Past Surgical History:  Procedure Laterality Date  . ANKLE ARTHROSCOPY W/ OPEN REPAIR Left   . COLONOSCOPY  2006   [place]-Tubular adenoma  . COLONOSCOPY N/A 07/23/2015   Procedure: COLONOSCOPY;  Surgeon: Lucilla Lame, MD;  Location: [facility];  Service: Gastroenterology;  Laterality: N/A;  WITH BIOPSIES---- TRANSVERSE COLON POLYP DESCENDING COLON POLYP  X  4  . ESOPHAGOGASTRODUODENOSCOPY N/A 07/23/2015   Procedure: ESOPHAGOGASTRODUODENOSCOPY (EGD);  Surgeon: Lucilla Lame, MD;  Location: [facility];  Service:  Gastroenterology;  Laterality: N/A;  WITH BIOPSY--- DUODENAL BIOPSY GASTRIC BIOPSY  . ESOPHAGOGASTRODUODENOSCOPY N/A 10/28/2016   Procedure: ESOPHAGOGASTRODUODENOSCOPY (EGD);  Surgeon: Manya Silvas, MD;  Location[facility] ENDOSCOPY;  Service: Endoscopy;  Laterality: N/A;  . ESOPHAGOGASTRODUODENOSCOPY (EGD) WITH PROPOFOL N/A 09/02/2017   Procedure: ESOPHAGOGASTRODUODENOSCOPY (EGD) WITH PROPOFOL;  Surgeon: Manya Silvas, MD;  Location: [facility] ENDOSCOPY;  Service: Endoscopy;  Laterality: N/A;  . SKIN CANCER EXCISION     LIP  . TONSILLECTOMY     age 1  . UPPER GI ENDOSCOPY  12/2009   Dr. Ivory Broad gastritisl negative for H. pylori; duodenal biopsies neg for sprue   Social History   Occupational History  . Occupation: retired Building surveyor: RETIRED  Tobacco Use  . Smoking status: Former Smoker    Packs/day: 0.50    Years: 40.00    Pack years: 20.00    Types: Cigarettes    Quit date: 03/10/2020    Years since quitting: 0.8  . Smokeless tobacco: Never Used  . Tobacco comment: cigars occassionally  Vaping Use  . Vaping Use: Never used  Substance and Sexual Activity  . Alcohol use: Yes    Alcohol/week: 14.0 standard drinks    Types: 14 Glasses of wine per week    Comment: 2 glasses of wine daily  . Drug use: No  . Sexual activity: Not Currently

## 2021-01-16 ENCOUNTER — Ambulatory Visit: Payer: Medicare Other | Admitting: Podiatry

## 2021-01-20 ENCOUNTER — Other Ambulatory Visit: Payer: Self-pay | Admitting: Internal Medicine

## 2021-01-20 DIAGNOSIS — F339 Major depressive disorder, recurrent, unspecified: Secondary | ICD-10-CM

## 2021-01-21 ENCOUNTER — Encounter: Payer: Self-pay | Admitting: Podiatry

## 2021-01-21 ENCOUNTER — Other Ambulatory Visit: Payer: Self-pay

## 2021-01-21 ENCOUNTER — Ambulatory Visit (INDEPENDENT_AMBULATORY_CARE_PROVIDER_SITE_OTHER): Payer: Medicare Other | Admitting: Podiatry

## 2021-01-21 DIAGNOSIS — M79609 Pain in unspecified limb: Secondary | ICD-10-CM | POA: Diagnosis not present

## 2021-01-21 DIAGNOSIS — L608 Other nail disorders: Secondary | ICD-10-CM | POA: Diagnosis not present

## 2021-01-21 DIAGNOSIS — B351 Tinea unguium: Secondary | ICD-10-CM | POA: Diagnosis not present

## 2021-01-21 DIAGNOSIS — L600 Ingrowing nail: Secondary | ICD-10-CM

## 2021-01-21 NOTE — Progress Notes (Signed)
Complaint:  Visit Type: Patient returns to my office for continued preventative foot care services. Complaint: Patient states" my nails have grown long and thick and become painful to walk and wear shoes"  The patient presents for preventative foot care services. No changes to ROS.   Podiatric Exam: Vascular: dorsalis pedis and posterior tibial pulses are palpable bilateral. Capillary return is immediate. Temperature gradient is WNL. Skin turgor WNL  Sensorium: Normal Semmes Weinstein monofilament test. Normal tactile sensation bilaterally. Nail Exam: Pt has thick disfigured discolored nails with subungual debris noted bilateral entire nail hallux through fifth toenails.  Ingrown toenail medial border hallux  B/L.  Ulcer Exam: There is no evidence of ulcer or pre-ulcerative changes or infection. Orthopedic Exam: Muscle tone and strength are WNL. No limitations in general ROM. No crepitus or effusions noted. HAV  B/L.  Hammer toe second right.   Skin: No Porokeratosis. No infection or ulcers  Diagnosis:  Onychomycosis, , Pain in right toe, pain in left toes,  Pincer nails   Treatment & Plan Procedures and Treatment: Consent by patient was obtained for treatment procedures. The patient understood the discussion of treatment and procedures well. All questions were answered thoroughly reviewed. Debridement of mycotic and hypertrophic toenails, 1 through 5 bilateral and clearing of subungual debris. No ulceration, no infection noted.  Return Visit-Office Procedure: Patient instructed to return to the office for a follow up visit 10 weeks  for continued evaluation and treatment.    Gardiner Barefoot DPM

## 2021-01-23 ENCOUNTER — Other Ambulatory Visit: Payer: Self-pay | Admitting: Internal Medicine

## 2021-01-23 DIAGNOSIS — I10 Essential (primary) hypertension: Secondary | ICD-10-CM

## 2021-01-23 DIAGNOSIS — F5101 Primary insomnia: Secondary | ICD-10-CM

## 2021-01-23 MED ORDER — METOPROLOL SUCCINATE ER 50 MG PO TB24: ORAL_TABLET | ORAL | 1 refills | Status: DC

## 2021-01-23 MED ORDER — ESZOPICLONE 2 MG PO TABS: 2.0000 mg | ORAL_TABLET | Freq: Every evening | ORAL | 1 refills | Status: DC | PRN

## 2021-01-23 NOTE — Telephone Encounter (Signed)
Spoke with patient and reviewed refills and blood pressure.  An appointment was scheduled.  Nothing further needed at this time.

## 2021-01-23 NOTE — Telephone Encounter (Signed)
The patient called in reference to a medication that [facility] had sent a refill request 2 days ago and they still haven't received a response back.  [facility] a refill on   metoprolol succinate (TOPROL-XL) 50 MG 24 hr tablet  He also seen Dr. Jerilee Hoh 01/07/2021 and she was supposed to send eszopiclone (LUNESTA) 2 MG TABS tablet 90 days and she only sent 36 tablets and 1 refill  He would also like to speak to someone about his high BP readings.  He would like to speak with Britney in regards to this office and the Provider issues.  Please advise

## 2021-01-23 NOTE — Telephone Encounter (Signed)
There is a refill available with [facility] of metoprolol.  Another refill sent.

## 2021-01-23 NOTE — Addendum Note (Signed)
Addended by: Westley Hummer B on: 01/23/2021 02:10 PM   Modules accepted: Orders

## 2021-01-23 NOTE — Addendum Note (Signed)
Addended by: Westley Hummer B on: 01/23/2021 02:17 PM   Modules accepted: Orders

## 2021-01-27 ENCOUNTER — Ambulatory Visit (INDEPENDENT_AMBULATORY_CARE_PROVIDER_SITE_OTHER): Payer: Medicare Other | Admitting: Internal Medicine

## 2021-01-27 ENCOUNTER — Encounter: Payer: Self-pay | Admitting: Internal Medicine

## 2021-01-27 ENCOUNTER — Other Ambulatory Visit: Payer: Self-pay

## 2021-01-27 ENCOUNTER — Other Ambulatory Visit: Payer: Medicare Other

## 2021-01-27 VITALS — BP 170/90 | HR 61 | Temp 98.1°F | Wt 176.7 lb

## 2021-01-27 DIAGNOSIS — I10 Essential (primary) hypertension: Secondary | ICD-10-CM | POA: Diagnosis not present

## 2021-01-27 LAB — BASIC METABOLIC PANEL
BUN: 26 mg/dL — ABNORMAL HIGH (ref 6–23)
CO2: 24 mEq/L (ref 19–32)
Calcium: 8.8 mg/dL (ref 8.4–10.5)
Chloride: 105 mEq/L (ref 96–112)
Creatinine, Ser: 0.95 mg/dL (ref 0.40–1.50)
GFR: 75.66 mL/min (ref >60.00)
Glucose, Bld: 134 mg/dL — ABNORMAL HIGH (ref 70–99)
Potassium: 3.9 mEq/L (ref 3.5–5.1)
Sodium: 139 mEq/L (ref 135–145)

## 2021-01-27 LAB — MAGNESIUM: Magnesium: 0.5 mg/dL — CL (ref 1.5–2.5)

## 2021-01-27 MED ORDER — AMLODIPINE BESYLATE 5 MG PO TABS: 5.0000 mg | ORAL_TABLET | Freq: Every day | ORAL | 3 refills | Status: DC

## 2021-01-27 NOTE — Progress Notes (Signed)
Established Patient Office Visit     This visit occurred during the SARS-CoV-2 public health emergency.  Safety protocols were in place, including screening questions prior to the visit, additional usage of staff PPE, and extensive cleaning of exam room while observing appropriate contact time as indicated for disinfecting solutions.    CC/Reason for Visit: Follow-up blood pressure and magnesium levels  HPI: Devin Garza is a 80 y.o. male who is coming in today for the above mentioned reasons.  He is here today because he has concerns about his blood pressure.  He had mailed me a report of his ambulatory measurements that I will insert below and is a follow-up to this appointment was scheduled to discuss.  He is on Diovan HCT 320/12.5 mg as well as metoprolol 50 mg daily.  He is also requesting a handicap placard refill today.       Past Medical/Surgical History: Past Medical History:  Diagnosis Date  . Actinic keratosis   . Anemia   . Basal cell adenocarcinoma 09/03/2008   Qualifier: Diagnosis of  By: Lorelei Pont MD, Frederico Hamman    Overview:  Overview:  Qualifier: Diagnosis of  By: Lorelei Pont MD, Frederico Hamman   . Basal cell carcinoma 11/06/2008   Right mastoid/neck.   . Basal cell carcinoma 11/13/2008   Left lateral nose medial infraorbital ~2cm lat. to med. canthus. Excised 01/08/2009, margins free.  . Basal cell carcinoma 10/09/2009   Right upper back, paraspinal inf. to base of neck. Excised 12/03/2009  . Basal cell carcinoma 06/11/2013   Left lateral forehead. Excised 07/18/2013, margins free.  . Basal cell carcinoma 07/30/2015   Right nasal ala. Nodular pattern.  . Basal cell carcinoma 07/30/2015   Right mid to sup. helix. Nodular.  . Basal cell carcinoma 08/25/2016   Right mid to sup. helix. Superficial with focal infiltration. Excised 09/28/2016, margins free.  . Basal cell carcinoma 03/16/2017   Right mid to sup. helix. Mixed pattern, ulcerated  . Basal cell carcinoma 05/10/2018    Right nasal tip. BCC with focal sclerosis.  . Basal cell carcinoma of lip 2007   multiple sites  . Blood transfusion without reported diagnosis   . ED (erectile dysfunction)   . GERD (gastroesophageal reflux disease)   . HLD (hyperlipidemia)   . Hx of squamous cell carcinoma of skin 08/25/2016   L superior pectoral  . Hypertension   . Osteoarthritis   . Peripheral vascular disease ([place])    Occlusion and Stenosis of Carotid Artery  . Squamous cell carcinoma of skin 08/25/2016   Left superior pectoral. KA pattern. EDC.  . Tobacco abuse   . Tubular adenoma of colon 2005  . Wears dentures    full upper and lower    Past Surgical History:  Procedure Laterality Date  . ANKLE ARTHROSCOPY W/ OPEN REPAIR Left   . COLONOSCOPY  2006   [place]-Tubular adenoma  . COLONOSCOPY N/A 07/23/2015   Procedure: COLONOSCOPY;  Surgeon: Lucilla Lame, MD;  Location: [facility];  Service: Gastroenterology;  Laterality: N/A;  WITH BIOPSIES---- TRANSVERSE COLON POLYP DESCENDING COLON POLYP  X  4  . ESOPHAGOGASTRODUODENOSCOPY N/A 07/23/2015   Procedure: ESOPHAGOGASTRODUODENOSCOPY (EGD);  Surgeon: Lucilla Lame, MD;  Location: [facility];  Service: Gastroenterology;  Laterality: N/A;  WITH BIOPSY--- DUODENAL BIOPSY GASTRIC BIOPSY  . ESOPHAGOGASTRODUODENOSCOPY N/A 10/28/2016   Procedure: ESOPHAGOGASTRODUODENOSCOPY (EGD);  Surgeon: Manya Silvas, MD;  Location: [facility] ENDOSCOPY;  Service: Endoscopy;  Laterality: N/A;  . ESOPHAGOGASTRODUODENOSCOPY (EGD) WITH PROPOFOL N/A  09/02/2017   Procedure: ESOPHAGOGASTRODUODENOSCOPY (EGD) WITH PROPOFOL;  Surgeon: Manya Silvas, MD;  Location[facility] ENDOSCOPY;  Service: Endoscopy;  Laterality: N/A;  . SKIN CANCER EXCISION     LIP  . TONSILLECTOMY     age 89  . UPPER GI ENDOSCOPY  12/2009   Dr. Ivory Broad gastritisl negative for H. pylori; duodenal biopsies neg for sprue    Social History:  reports that he quit smoking about 10 months ago. His  smoking use included cigarettes. He has a 20.00 pack-year smoking history. He has never used smokeless tobacco. He reports current alcohol use of about 14.0 standard drinks of alcohol per week. He reports that he does not use drugs.  Allergies: No Known Allergies  Family History:  Family History  Problem Relation Age of Onset  . Lung cancer Mother 78  . Hearing loss Father   . Throat cancer Father   . Heart attack Father   . CAD Father   . Ovarian cancer Sister      Current Outpatient Medications:  .  amLODipine (NORVASC) 5 MG tablet, Take 1 tablet (5 mg total) by mouth daily., Disp: 90 tablet, Rfl: 3 .  atorvastatin (LIPITOR) 10 MG tablet, Take 1 tablet (10 mg total) by mouth daily., Disp: 90 tablet, Rfl: 0 .  buPROPion (WELLBUTRIN XL) 150 MG 24 hr tablet, TAKE 1 TABLET DAILY, Disp: 90 tablet, Rfl: 1 .  colchicine 0.6 MG tablet, TAKE 1 TABLET BY MOUTH 2 TIMES DAILY., Disp: 180 tablet, Rfl: 1 .  Diclofenac Sodium 1.5 % SOLN, Apply to small joints up to 4 times a day as needed, Disp: 450 mL, Rfl: 5 .  DIOVAN HCT 320-12.5 MG tablet, TAKE 1 TABLET DAILY, Disp: 90 tablet, Rfl: 1 .  esomeprazole (NEXIUM 24HR) 20 MG capsule, Take 2 capsules (40 mg total) by mouth 2 (two) times daily before a meal., Disp: 120 capsule, Rfl: 0 .  eszopiclone (LUNESTA) 2 MG TABS tablet, Take 1 tablet (2 mg total) by mouth at bedtime as needed for sleep. Take immediately before bedtime, Disp: 90 tablet, Rfl: 1 .  loratadine (CLARITIN) 10 MG tablet, Take 1 tablet (10 mg total) by mouth daily as needed for allergies., Disp: 30 tablet, Rfl: 0 .  magnesium oxide (MAG-OX) 400 (241.3 Mg) MG tablet, , Disp: , Rfl:  .  methylPREDNISolone (MEDROL) 4 MG tablet, Medrol dose pack. Take as instructed, Disp: 21 tablet, Rfl: 0 .  metoprolol succinate (TOPROL-XL) 50 MG 24 hr tablet, TAKE 1 TABLET BY MOUTH DAILY. TAKE WITH OR IMMEDIATELY FOLLOWING A MEAL., Disp: 90 tablet, Rfl: 1 .  Multiple Vitamin (MULTIVITAMIN) tablet, Take 1  tablet by mouth daily., Disp: , Rfl:  .  sodium chloride 1 g tablet, Take 1 tablet (1 g total) by mouth 3 (three) times daily with meals., Disp: 90 tablet, Rfl: 0 .  thiamine 100 MG tablet, Take 1 tablet (100 mg total) by mouth daily., Disp: 30 tablet, Rfl: 0  Review of Systems:  Constitutional: Denies fever, chills, diaphoresis, appetite change and fatigue.  HEENT: Denies photophobia, eye pain, redness, hearing loss, ear pain, congestion, sore throat, rhinorrhea, sneezing, mouth sores, trouble swallowing, neck pain, neck stiffness and tinnitus.   Respiratory: Denies SOB, DOE, cough, chest tightness,  and wheezing.   Cardiovascular: Denies chest pain, palpitations and leg swelling.  Gastrointestinal: Denies nausea, vomiting, abdominal pain, diarrhea, constipation, blood in stool and abdominal distention.  Genitourinary: Denies dysuria, urgency, frequency, hematuria, flank pain and difficulty urinating.  Endocrine: Denies:  hot or cold intolerance, sweats, changes in hair or nails, polyuria, polydipsia. Musculoskeletal: Denies myalgias, back pain, joint swelling, arthralgias and gait problem.  Skin: Denies pallor, rash and wound.  Neurological: Denies dizziness, seizures, syncope, weakness, light-headedness, numbness and headaches.  Hematological: Denies adenopathy. Easy bruising, personal or family bleeding history  Psychiatric/Behavioral: Denies suicidal ideation, mood changes, confusion, nervousness, sleep disturbance and agitation    Physical Exam: Vitals:   01/27/21 0955  BP: (!) 170/90  Pulse: 61  Temp: 98.1 F (36.7 C)  TempSrc: Oral  SpO2: 94%  Weight: 176 lb 11.2 oz (80.2 kg)    Body mass index is 26.87 kg/m.  Constitutional: NAD, calm, comfortable, ambulates with a cane Eyes: PERRL, lids and conjunctivae normal, wears corrective lenses ENMT: Mucous membranes are moist. Respiratory: clear to auscultation bilaterally, no wheezing, no crackles. Normal respiratory effort. No  accessory muscle use.  Cardiovascular: Regular rate and rhythm, no murmurs / rubs / gallops. No extremity edema.  Psychiatric: Normal judgment and insight. Alert and oriented x 3. Normal mood.    Impression and Plan:  Essential hypertension -Add amlodipine 5 mg daily. -We have discussed low-sodium diet and he will follow-up in 6 to 8 weeks.  Hypomagnesemia -Check magnesium and basic metabolic profile today.    Lelon Frohlich, MD [facility]

## 2021-01-27 NOTE — Addendum Note (Signed)
Addended by: Tessie Fass D on: 01/27/2021 10:26 AM   Modules accepted: Orders

## 2021-01-28 ENCOUNTER — Other Ambulatory Visit: Payer: Self-pay | Admitting: Internal Medicine

## 2021-01-28 MED ORDER — MAGNESIUM OXIDE 400 (241.3 MG) MG PO TABS: 400.0000 mg | ORAL_TABLET | Freq: Three times a day (TID) | ORAL | 1 refills | Status: DC

## 2021-02-04 ENCOUNTER — Encounter: Payer: Self-pay | Admitting: Orthopaedic Surgery

## 2021-02-04 ENCOUNTER — Ambulatory Visit (INDEPENDENT_AMBULATORY_CARE_PROVIDER_SITE_OTHER): Payer: Medicare Other | Admitting: Orthopaedic Surgery

## 2021-02-04 ENCOUNTER — Other Ambulatory Visit: Payer: Self-pay

## 2021-02-04 DIAGNOSIS — M19132 Post-traumatic osteoarthritis, left wrist: Secondary | ICD-10-CM

## 2021-02-04 DIAGNOSIS — M25531 Pain in right wrist: Secondary | ICD-10-CM | POA: Diagnosis not present

## 2021-02-04 DIAGNOSIS — M25532 Pain in left wrist: Secondary | ICD-10-CM

## 2021-02-04 DIAGNOSIS — M19131 Post-traumatic osteoarthritis, right wrist: Secondary | ICD-10-CM

## 2021-02-04 MED ORDER — DICLOFENAC SODIUM 1.5 % EX SOLN: 1.0000 mL | Freq: Four times a day (QID) | CUTANEOUS | 5 refills | Status: AC | PRN

## 2021-02-04 NOTE — Progress Notes (Signed)
The patient is a very pleasant and active 80 year old gentleman who comes in for continued follow-up as it relates to severe pain in both of his wrist with the left worse than the right. Diclofenac solution is helping enough for he can do some things with his wrist but he still has significant pain. X-rays have shown chronic scapholunate tears with insufficiency of the risks. This is congruent bilaterally in terms of the chronic changes in both wrists. I did place a steroid injection in the left wrist at his last visit. He said that did not help much at all.  Examination of both wrist show today have painful arc of motion with flexion and extension.  I will send in some more diclofenac solution for his wrist. It is worth at least having a consultation with a hand specialist such as Dr. Burney Gauze to see if there is anything else that can be recommended for this gentleman's wrist. He agrees with at least seeing a hand specialist to see what their opinion is about these wrist. We will work on making that referral.

## 2021-02-18 ENCOUNTER — Telehealth: Payer: Self-pay | Admitting: Internal Medicine

## 2021-02-18 DIAGNOSIS — E782 Mixed hyperlipidemia: Secondary | ICD-10-CM

## 2021-02-18 MED ORDER — ATORVASTATIN CALCIUM 10 MG PO TABS: 10.0000 mg | ORAL_TABLET | Freq: Every day | ORAL | 1 refills | Status: DC

## 2021-02-18 NOTE — Telephone Encounter (Signed)
Rx sent 

## 2021-02-18 NOTE — Telephone Encounter (Signed)
atorvastatin (LIPITOR) 10 MG tablet  [employer], [place] Phone:  [phone]  Fax:  [phone]

## 2021-02-26 DIAGNOSIS — M19039 Primary osteoarthritis, unspecified wrist: Secondary | ICD-10-CM | POA: Diagnosis not present

## 2021-03-09 ENCOUNTER — Other Ambulatory Visit: Payer: Self-pay

## 2021-03-10 ENCOUNTER — Encounter: Payer: Self-pay | Admitting: Internal Medicine

## 2021-03-10 ENCOUNTER — Ambulatory Visit (INDEPENDENT_AMBULATORY_CARE_PROVIDER_SITE_OTHER): Payer: Medicare Other | Admitting: Internal Medicine

## 2021-03-10 VITALS — BP 150/80 | HR 75 | Temp 98.6°F | Wt 174.9 lb

## 2021-03-10 DIAGNOSIS — I10 Essential (primary) hypertension: Secondary | ICD-10-CM

## 2021-03-10 MED ORDER — AMLODIPINE BESYLATE 10 MG PO TABS: 10.0000 mg | ORAL_TABLET | Freq: Every day | ORAL | 1 refills | Status: DC

## 2021-03-10 NOTE — Progress Notes (Signed)
Established Patient Office Visit     This visit occurred during the SARS-CoV-2 public health emergency.  Safety protocols were in place, including screening questions prior to the visit, additional usage of staff PPE, and extensive cleaning of exam room while observing appropriate contact time as indicated for disinfecting solutions.    CC/Reason for Visit: Blood pressure follow-up  HPI: Devin Garza is a 80 y.o. male who is coming in today for the above mentioned reasons.  He is on Diovan HCT 320/12.5 mg, metoprolol 50 mg daily and at last visit amlodipine 5 mg was added to his regimen.  He brings in his ambulatory measurements.  His systolics have been anywhere from 1 30-1 50 but averaging in the 270J with diastolics pretty consistently in the low to mid 80s.  He has no acute complaints today.  Past Medical/Surgical History: Past Medical History:  Diagnosis Date  . Actinic keratosis   . Anemia   . Basal cell adenocarcinoma 09/03/2008   Qualifier: Diagnosis of  By: Lorelei Pont MD, Frederico Hamman    Overview:  Overview:  Qualifier: Diagnosis of  By: Lorelei Pont MD, Frederico Hamman   . Basal cell carcinoma 11/06/2008   Right mastoid/neck.   . Basal cell carcinoma 11/13/2008   Left lateral nose medial infraorbital ~2cm lat. to med. canthus. Excised 01/08/2009, margins free.  . Basal cell carcinoma 10/09/2009   Right upper back, paraspinal inf. to base of neck. Excised 12/03/2009  . Basal cell carcinoma 06/11/2013   Left lateral forehead. Excised 07/18/2013, margins free.  . Basal cell carcinoma 07/30/2015   Right nasal ala. Nodular pattern.  . Basal cell carcinoma 07/30/2015   Right mid to sup. helix. Nodular.  . Basal cell carcinoma 08/25/2016   Right mid to sup. helix. Superficial with focal infiltration. Excised 09/28/2016, margins free.  . Basal cell carcinoma 03/16/2017   Right mid to sup. helix. Mixed pattern, ulcerated  . Basal cell carcinoma 05/10/2018   Right nasal tip. BCC with focal  sclerosis.  . Basal cell carcinoma of lip 2007   multiple sites  . Blood transfusion without reported diagnosis   . ED (erectile dysfunction)   . GERD (gastroesophageal reflux disease)   . HLD (hyperlipidemia)   . Hx of squamous cell carcinoma of skin 08/25/2016   L superior pectoral  . Hypertension   . Osteoarthritis   . Peripheral vascular disease ([place])    Occlusion and Stenosis of Carotid Artery  . Squamous cell carcinoma of skin 08/25/2016   Left superior pectoral. KA pattern. EDC.  . Tobacco abuse   . Tubular adenoma of colon 2005  . Wears dentures    full upper and lower    Past Surgical History:  Procedure Laterality Date  . ANKLE ARTHROSCOPY W/ OPEN REPAIR Left   . COLONOSCOPY  2006   [place]-Tubular adenoma  . COLONOSCOPY N/A 07/23/2015   Procedure: COLONOSCOPY;  Surgeon: Lucilla Lame, MD;  Location: [place];  Service: Gastroenterology;  Laterality: N/A;  WITH BIOPSIES---- TRANSVERSE COLON POLYP DESCENDING COLON POLYP  X  4  . ESOPHAGOGASTRODUODENOSCOPY N/A 07/23/2015   Procedure: ESOPHAGOGASTRODUODENOSCOPY (EGD);  Surgeon: Lucilla Lame, MD;  Location: [place];  Service: Gastroenterology;  Laterality: N/A;  WITH BIOPSY--- DUODENAL BIOPSY GASTRIC BIOPSY  . ESOPHAGOGASTRODUODENOSCOPY N/A 10/28/2016   Procedure: ESOPHAGOGASTRODUODENOSCOPY (EGD);  Surgeon: Manya Silvas, MD;  Location: [facility] ENDOSCOPY;  Service: Endoscopy;  Laterality: N/A;  . ESOPHAGOGASTRODUODENOSCOPY (EGD) WITH PROPOFOL N/A 09/02/2017   Procedure: ESOPHAGOGASTRODUODENOSCOPY (EGD) WITH PROPOFOL;  Surgeon:  Manya Silvas, MD;  Location: [facility] ENDOSCOPY;  Service: Endoscopy;  Laterality: N/A;  . SKIN CANCER EXCISION     LIP  . TONSILLECTOMY     Garza 30  . UPPER GI ENDOSCOPY  12/2009   Dr. Ivory Broad gastritisl negative for H. pylori; duodenal biopsies neg for sprue    Social History:  reports that he quit smoking about a year ago. His smoking use included cigarettes. He has a  20.00 pack-year smoking history. He has never used smokeless tobacco. He reports current alcohol use of about 14.0 standard drinks of alcohol per week. He reports that he does not use drugs.  Allergies: No Known Allergies  Family History:  Family History  Problem Relation Garza of Onset  . Lung cancer Mother 55  . Hearing loss Father   . Throat cancer Father   . Heart attack Father   . CAD Father   . Ovarian cancer Sister      Current Outpatient Medications:  .  atorvastatin (LIPITOR) 10 MG tablet, Take 1 tablet (10 mg total) by mouth daily., Disp: 90 tablet, Rfl: 1 .  buPROPion (WELLBUTRIN XL) 150 MG 24 hr tablet, TAKE 1 TABLET DAILY, Disp: 90 tablet, Rfl: 1 .  colchicine 0.6 MG tablet, TAKE 1 TABLET BY MOUTH 2 TIMES DAILY., Disp: 180 tablet, Rfl: 1 .  Diclofenac Sodium 1.5 % SOLN, Apply 1 mL topically 4 (four) times daily as needed., Disp: 450 mL, Rfl: 5 .  DIOVAN HCT 320-12.5 MG tablet, TAKE 1 TABLET DAILY, Disp: 90 tablet, Rfl: 1 .  esomeprazole (NEXIUM 24HR) 20 MG capsule, Take 2 capsules (40 mg total) by mouth 2 (two) times daily before a meal., Disp: 120 capsule, Rfl: 0 .  eszopiclone (LUNESTA) 2 MG TABS tablet, Take 1 tablet (2 mg total) by mouth at bedtime as needed for sleep. Take immediately before bedtime, Disp: 90 tablet, Rfl: 1 .  loratadine (CLARITIN) 10 MG tablet, Take 1 tablet (10 mg total) by mouth daily as needed for allergies., Disp: 30 tablet, Rfl: 0 .  magnesium oxide (MAG-OX) 400 (241.3 Mg) MG tablet, Take 1 tablet (400 mg total) by mouth in the morning, at noon, and at bedtime., Disp: 270 tablet, Rfl: 1 .  metoprolol succinate (TOPROL-XL) 50 MG 24 hr tablet, TAKE 1 TABLET BY MOUTH DAILY. TAKE WITH OR IMMEDIATELY FOLLOWING A MEAL., Disp: 90 tablet, Rfl: 1 .  Multiple Vitamin (MULTIVITAMIN) tablet, Take 1 tablet by mouth daily., Disp: , Rfl:  .  amLODipine (NORVASC) 10 MG tablet, Take 1 tablet (10 mg total) by mouth daily., Disp: 90 tablet, Rfl: 1 .  sodium chloride  1 g tablet, Take 1 tablet (1 g total) by mouth 3 (three) times daily with meals. (Patient not taking: Reported on 03/10/2021), Disp: 90 tablet, Rfl: 0 .  thiamine 100 MG tablet, Take 1 tablet (100 mg total) by mouth daily. (Patient not taking: Reported on 03/10/2021), Disp: 30 tablet, Rfl: 0  Review of Systems:  Constitutional: Denies fever, chills, diaphoresis, appetite change and fatigue.  HEENT: Denies photophobia, eye pain, redness, hearing loss, ear pain, congestion, sore throat, rhinorrhea, sneezing, mouth sores, trouble swallowing, neck pain, neck stiffness and tinnitus.   Respiratory: Denies SOB, DOE, cough, chest tightness,  and wheezing.   Cardiovascular: Denies chest pain, palpitations and leg swelling.  Gastrointestinal: Denies nausea, vomiting, abdominal pain, diarrhea, constipation, blood in stool and abdominal distention.  Genitourinary: Denies dysuria, urgency, frequency, hematuria, flank pain and difficulty urinating.  Endocrine: Denies: hot or  cold intolerance, sweats, changes in hair or nails, polyuria, polydipsia. Musculoskeletal: Denies myalgias, back pain, joint swelling, arthralgias and gait problem.  Skin: Denies pallor, rash and wound.  Neurological: Denies dizziness, seizures, syncope, weakness, light-headedness, numbness and headaches.  Hematological: Denies adenopathy. Easy bruising, personal or family bleeding history  Psychiatric/Behavioral: Denies suicidal ideation, mood changes, confusion, nervousness, sleep disturbance and agitation    Physical Exam: Vitals:   03/10/21 1355  BP: (!) 150/80  Pulse: 75  Temp: 98.6 F (37 C)  TempSrc: Oral  SpO2: 97%  Weight: 174 lb 14.4 oz (79.3 kg)    Body mass index is 26.59 kg/m.   Constitutional: NAD, calm, comfortable, ambulates with a cane Eyes: PERRL, lids and conjunctivae normal, wears corrective lenses ENMT: Mucous membranes are moist.  Respiratory: clear to auscultation bilaterally, no wheezing, no  crackles. Normal respiratory effort. No accessory muscle use.  Cardiovascular: Regular rate and rhythm, no murmurs / rubs / gallops. No extremity edema.  Neurologic: Grossly intact and nonfocal Psychiatric: Normal judgment and insight. Alert and oriented x 3. Normal mood.    Impression and Plan:  Essential hypertension  -Still not at goal but improving. -Increase amlodipine from 5 to 10 mg daily, continue metoprolol 50 mg daily and Diovan HCT 320/12.5 mg.    Patient Instructions  -Nice seeing you today!!  -increase amlodipine to 10 mg daily.  -Schedule follow up in 8 weeks.     Lelon Frohlich, MD [facility]

## 2021-03-10 NOTE — Patient Instructions (Signed)
-  Nice seeing you today!!  -increase amlodipine to 10 mg daily.  -Schedule follow up in 8 weeks.

## 2021-03-30 NOTE — Progress Notes (Signed)
Subjective:   Devin Garza is a 80 y.o. male who presents for an Initial Medicare Annual Wellness Visit.  I connected with Devin Garza today by telephone and verified that I am speaking with the correct person using two identifiers. Location patient: home Location provider: work Persons participating in the virtual visit: patient, provider.   I discussed the limitations, risks, security and privacy concerns of performing an evaluation and management service by telephone and the availability of in person appointments. I also discussed with the patient that there may be a patient responsible charge related to this service. The patient expressed understanding and verbally consented to this telephonic visit.    Interactive audio and video telecommunications were attempted between this provider and patient, however failed, due to patient having technical difficulties OR patient did not have access to video capability.  We continued and completed visit with audio only.     Location: Patient: Home Provider: Office Persons participating in the virtual visit: patient, provider   I discussed the limitations of evaluation and management by telemedicine and the availability of in person appointments. The patient expressed understanding and agreed to proceed.     Devin Baas Keithon Mccoin,LPN   Review of Systems    N/A Cardiac Risk Factors include: advanced age (>70men, >41 women);hypertension;male gender;dyslipidemia     Objective:    Today's Vitals   There is no height or weight on file to calculate BMI.  Advanced Directives 03/31/2021 10/24/2020 10/15/2020 02/01/2020 05/18/2019 05/03/2018 09/02/2017  Does Patient Have a Medical Advance Directive? No Yes No Yes Yes Yes Yes  Type of Advance Directive - Out of facility DNR (pink MOST or yellow form) - - Press photographer;Living will [place];Living will [place];Living will  Does patient want to make  changes to medical advance directive? - No - Patient declined - - No - Patient declined - -  Copy of [employer] [place] in Chart? - - - - No - copy requested No - copy requested No - copy requested  Would patient like information on creating a medical advance directive? No - Patient declined No - Patient declined No - Patient declined - - - -  Pre-existing out of facility DNR order (yellow form or pink MOST form) - Yellow form placed in chart (order not valid for inpatient use) - - - - -    Current Medications (verified) Outpatient Encounter Medications as of 03/31/2021  Medication Sig  . amLODipine (NORVASC) 10 MG tablet Take 1 tablet (10 mg total) by mouth daily.  Marland Kitchen atorvastatin (LIPITOR) 10 MG tablet Take 1 tablet (10 mg total) by mouth daily.  Marland Kitchen buPROPion (WELLBUTRIN XL) 150 MG 24 hr tablet TAKE 1 TABLET DAILY  . colchicine 0.6 MG tablet TAKE 1 TABLET BY MOUTH 2 TIMES DAILY.  . Diclofenac Sodium 1.5 % SOLN Apply 1 mL topically 4 (four) times daily as needed.  Marland Kitchen DIOVAN HCT 320-12.5 MG tablet TAKE 1 TABLET DAILY  . esomeprazole (NEXIUM 24HR) 20 MG capsule Take 2 capsules (40 mg total) by mouth 2 (two) times daily before a meal.  . eszopiclone (LUNESTA) 2 MG TABS tablet Take 1 tablet (2 mg total) by mouth at bedtime as needed for sleep. Take immediately before bedtime  . loratadine (CLARITIN) 10 MG tablet Take 1 tablet (10 mg total) by mouth daily as needed for allergies.  . magnesium oxide (MAG-OX) 400 (241.3 Mg) MG tablet Take 1 tablet (400 mg total) by mouth in  the morning, at noon, and at bedtime.  . metoprolol succinate (TOPROL-XL) 50 MG 24 hr tablet TAKE 1 TABLET BY MOUTH DAILY. TAKE WITH OR IMMEDIATELY FOLLOWING A MEAL.  . Multiple Vitamin (MULTIVITAMIN) tablet Take 1 tablet by mouth daily.  . sodium chloride 1 g tablet Take 1 tablet (1 g total) by mouth 3 (three) times daily with meals. (Patient not taking: No sig reported)  . thiamine 100 MG tablet Take 1 tablet (100 mg total)  by mouth daily. (Patient not taking: No sig reported)   No facility-administered encounter medications on file as of 03/31/2021.    Allergies (verified) Patient has no known allergies.   History: Past Medical History:  Diagnosis Date  . Actinic keratosis   . Anemia   . Basal cell adenocarcinoma 09/03/2008   Qualifier: Diagnosis of  By: Lorelei Pont MD, Frederico Hamman    Overview:  Overview:  Qualifier: Diagnosis of  By: Lorelei Pont MD, Frederico Hamman   . Basal cell carcinoma 11/06/2008   Right mastoid/neck.   . Basal cell carcinoma 11/13/2008   Left lateral nose medial infraorbital ~2cm lat. to med. canthus. Excised 01/08/2009, margins free.  . Basal cell carcinoma 10/09/2009   Right upper back, paraspinal inf. to base of neck. Excised 12/03/2009  . Basal cell carcinoma 06/11/2013   Left lateral forehead. Excised 07/18/2013, margins free.  . Basal cell carcinoma 07/30/2015   Right nasal ala. Nodular pattern.  . Basal cell carcinoma 07/30/2015   Right mid to sup. helix. Nodular.  . Basal cell carcinoma 08/25/2016   Right mid to sup. helix. Superficial with focal infiltration. Excised 09/28/2016, margins free.  . Basal cell carcinoma 03/16/2017   Right mid to sup. helix. Mixed pattern, ulcerated  . Basal cell carcinoma 05/10/2018   Right nasal tip. BCC with focal sclerosis.  . Basal cell carcinoma of lip 2007   multiple sites  . Blood transfusion without reported diagnosis   . ED (erectile dysfunction)   . GERD (gastroesophageal reflux disease)   . HLD (hyperlipidemia)   . Hx of squamous cell carcinoma of skin 08/25/2016   L superior pectoral  . Hypertension   . Osteoarthritis   . Peripheral vascular disease ([place])    Occlusion and Stenosis of Carotid Artery  . Squamous cell carcinoma of skin 08/25/2016   Left superior pectoral. KA pattern. EDC.  . Tobacco abuse   . Tubular adenoma of colon 2005  . Wears dentures    full upper and lower   Past Surgical History:  Procedure Laterality Date  .  ANKLE ARTHROSCOPY W/ OPEN REPAIR Left   . COLONOSCOPY  2006   [place]-Tubular adenoma  . COLONOSCOPY N/A 07/23/2015   Procedure: COLONOSCOPY;  Surgeon: Lucilla Lame, MD;  Location: [place];  Service: Gastroenterology;  Laterality: N/A;  WITH BIOPSIES---- TRANSVERSE COLON POLYP DESCENDING COLON POLYP  X  4  . ESOPHAGOGASTRODUODENOSCOPY N/A 07/23/2015   Procedure: ESOPHAGOGASTRODUODENOSCOPY (EGD);  Surgeon: Lucilla Lame, MD;  Location: [place];  Service: Gastroenterology;  Laterality: N/A;  WITH BIOPSY--- DUODENAL BIOPSY GASTRIC BIOPSY  . ESOPHAGOGASTRODUODENOSCOPY N/A 10/28/2016   Procedure: ESOPHAGOGASTRODUODENOSCOPY (EGD);  Surgeon: Manya Silvas, MD;  Location: [facility] ENDOSCOPY;  Service: Endoscopy;  Laterality: N/A;  . ESOPHAGOGASTRODUODENOSCOPY (EGD) WITH PROPOFOL N/A 09/02/2017   Procedure: ESOPHAGOGASTRODUODENOSCOPY (EGD) WITH PROPOFOL;  Surgeon: Manya Silvas, MD;  Location: [facility] ENDOSCOPY;  Service: Endoscopy;  Laterality: N/A;  . SKIN CANCER EXCISION     LIP  . TONSILLECTOMY     age 34  .  UPPER GI ENDOSCOPY  12/2009   Dr. Ivory Broad gastritisl negative for H. pylori; duodenal biopsies neg for sprue   Family History  Problem Relation Age of Onset  . Lung cancer Mother 54  . Hearing loss Father   . Throat cancer Father   . Heart attack Father   . CAD Father   . Ovarian cancer Sister    Social History   Socioeconomic History  . Marital status: Single    Spouse name: Not on file  . Number of children: 2  . Years of education: Not on file  . Highest education level: Not on file  Occupational History  . Occupation: retired Building surveyor: RETIRED  Tobacco Use  . Smoking status: Former Smoker    Packs/day: 0.50    Years: 40.00    Pack years: 20.00    Types: Cigarettes    Quit date: 03/10/2020    Years since quitting: 1.0  . Smokeless tobacco: Never Used  . Tobacco comment: cigars occassionally  Vaping Use  . Vaping Use: Never  used  Substance and Sexual Activity  . Alcohol use: Yes    Alcohol/week: 14.0 standard drinks    Types: 14 Glasses of wine per week    Comment: 2 glasses of wine daily  . Drug use: No  . Sexual activity: Not Currently  Other Topics Concern  . Not on file  Social History Narrative   Lives alone   Social Determinants of Health   Financial Resource Strain: Low Risk   . Difficulty of Paying Living Expenses: Not hard at all  Food Insecurity: No Food Insecurity  . Worried About Charity fundraiser in the Last Year: Never true  . Ran Out of Food in the Last Year: Never true  Transportation Needs: No Transportation Needs  . Lack of Transportation (Medical): No  . Lack of Transportation (Non-Medical): No  Physical Activity: Not on file  Stress: No Stress Concern Present  . Feeling of Stress : Not at all  Social Connections: Socially Isolated  . Frequency of Communication with Friends and Family: More than three times a week  . Frequency of Social Gatherings with Friends and Family: More than three times a week  . Attends Religious Services: Never  . Active Member of Clubs or Organizations: No  . Attends Archivist Meetings: Never  . Marital Status: Widowed    Tobacco Counseling Counseling given: Not Answered Comment: cigars occassionally   Clinical Intake:  Pre-visit preparation completed: Yes  Pain : No/denies pain     Nutritional Risks: None Diabetes: No  How often do you need to have someone help you when you read instructions, pamphlets, or other written materials from your doctor or pharmacy?: 1 - Never  Diabetic?No  Interpreter Needed?: No  Information entered by :: l.[email or web address] Granderson,lpn   Activities of Daily Living In your present state of health, do you have any difficulty performing the following activities: 03/31/2021  Hearing? N  Vision? N  Difficulty concentrating or making decisions? N  Walking or climbing stairs? N  Dressing or bathing? N  Doing  errands, shopping? N  Using the Toilet? N  In the past six months, have you accidently leaked urine? N  Do you have problems with loss of bowel control? N  Managing your Medications? N  Managing your Finances? N  Housekeeping or managing your Housekeeping? N  Some recent data might be hidden    Patient Care Team: Isaac Bliss, [place]  Y, MD as PCP - General (Internal Medicine)  Indicate any recent Medical Services you may have received from other than [facility] providers in the past year (date may be approximate).     Assessment:   This is a routine wellness examination for Devin Garza.  Hearing/Vision screen  Hearing Screening   125Hz  250Hz  500Hz  1000Hz  2000Hz  3000Hz  4000Hz  6000Hz  8000Hz   Right ear:           Left ear:           Vision Screening Comments: Annual eye exam wears glasses  Dietary issues and exercise activities discussed: Current Exercise Habits: Home exercise routine, Type of exercise: walking, Time (Minutes): 30, Frequency (Times/Week): 5, Weekly Exercise (Minutes/Week): 150, Intensity: Mild  Goals    . Exercise 3x per week (30 min per time)    . Increase physical activity     Starting 05/18/19, I will continue to walk 20-30 minutes 5 days per week.       Depression Screen PHQ 2/9 Scores 03/31/2021 03/10/2021 10/31/2020 05/27/2020 05/18/2019 05/03/2018 04/27/2017  PHQ - 2 Score 0 - 2 0 0 0 0  PHQ- 9 Score - - 8 0 0 0 -  Exception Documentation - Other- indicate reason in comment box - - - - -    Fall Risk Fall Risk  03/31/2021 05/27/2020 05/18/2019 05/03/2018 04/27/2017  Falls in the past year? 0 0 0 No No  Number falls in past yr: 0 0 - - -  Injury with Fall? 0 0 - - -  Follow up Falls evaluation completed - - - -    FALL RISK PREVENTION PERTAINING TO THE HOME:  Any stairs in or around the home? Yes  If so, are there any without handrails? Yes  Home free of loose throw rugs in walkways, pet beds, electrical cords, etc? Yes  Adequate lighting in your home to reduce risk of  falls? Yes   ASSISTIVE DEVICES UTILIZED TO PREVENT FALLS:  Life alert? No  Use of a cane, walker or w/c? No  Grab bars in the bathroom? Yes  Shower chair or bench in shower? Yes  Elevated toilet seat or a handicapped toilet? Yes     Cognitive Function:  Normal cognitive status assessed by direct observation by this Nurse Health Advisor. No abnormalities found.   MMSE - Mini Mental State Exam 05/18/2019 05/03/2018  Orientation to time 5 5  Orientation to Place 5 5  Registration 3 3  Attention/ Calculation 0 0  Recall 2 0  Recall-comments unable to recall 1 of 3 words unable to recall 3 of 3 words  Language- name 2 objects 0 0  Language- repeat 1 1  Language- follow 3 step command 0 3  Language- read & follow direction 0 0  Write a sentence 0 0  Copy design 0 0  Total score 16 17        Immunizations Immunization History  Administered Date(s) Administered  . Influenza Split 09/10/2012, 09/24/2013  . Influenza, High Dose Seasonal PF 09/26/2016, 09/26/2017, 10/16/2018, 08/05/2019, 08/05/2019, 09/14/2020  . Moderna Sars-Covid-2 Vaccination 01/23/2020, 02/21/2020, 12/07/2020  . Pneumococcal Conjugate-13 04/23/2015  . Pneumococcal Polysaccharide-23 01/21/2011  . Td 12/28/2003  . Tdap 08/17/2017  . Zoster 10/29/2013  . Zoster Recombinat (Shingrix) 02/17/2019, 07/10/2019    TDAP status: Up to date  Flu Vaccine status: Up to date  Pneumococcal vaccine status: Up to date  Covid-19 vaccine status: Completed vaccines  Qualifies for Shingles Vaccine? Yes   Zostavax completed Yes  Shingrix Completed?: No.    Education has been provided regarding the importance of this vaccine. Patient has been advised to call insurance company to determine out of pocket expense if they have not yet received this vaccine. Advised may also receive vaccine at local pharmacy or Health Dept. Verbalized acceptance and understanding.  Screening Tests Health Maintenance  Topic Date Due  .  COVID-19 Vaccine (4 - Booster for Moderna series) 06/07/2021  . INFLUENZA VACCINE  07/27/2021  . TETANUS/TDAP  08/18/2027  . PNA vac Low Risk Adult  Completed  . HPV VACCINES  Aged Out    Health Maintenance  There are no preventive care reminders to display for this patient.  Colorectal cancer screening: No longer required.   Lung Cancer Screening: (Low Dose CT Chest recommended if Age 10-80 years, 30 pack-year currently smoking OR have quit w/in 15years.) does qualify.   Lung Cancer Screening Referral: Pulmonology  Additional Screening:  Hepatitis C Screening: does not qualify  Vision Screening: Recommended annual ophthalmology exams for early detection of glaucoma and other disorders of the eye. Is the patient up to date with their annual eye exam?  Yes  Who is the provider or what is the name of the office in which the patient attends annual eye exams? referal If pt is not established with a provider, would they like to be referred to a provider to establish care? Yes .   Dental Screening: Recommended annual dental exams for proper oral hygiene  Community Resource Referral / Chronic Care Management: CRR required this visit?  No   CCM required this visit?  No      Plan:     I have personally reviewed and noted the following in the patient's chart:   . Medical and social history . Use of alcohol, tobacco or illicit drugs  . Current medications and supplements . Functional ability and status . Nutritional status . Physical activity . Advanced directives . List of other physicians . Hospitalizations, surgeries, and ER visits in previous 12 months . Vitals . Screenings to include cognitive, depression, and falls . Referrals and appointments  In addition, I have reviewed and discussed with patient certain preventive protocols, quality metrics, and best practice recommendations. A written personalized care plan for preventive services as well as general preventive  health recommendations were provided to patient.     Randel Pigg, LPN   02/27/5455   Nurse Notes: none

## 2021-03-31 ENCOUNTER — Ambulatory Visit (INDEPENDENT_AMBULATORY_CARE_PROVIDER_SITE_OTHER): Payer: Medicare Other

## 2021-03-31 DIAGNOSIS — Z0100 Encounter for examination of eyes and vision without abnormal findings: Secondary | ICD-10-CM | POA: Diagnosis not present

## 2021-03-31 DIAGNOSIS — Z0000 Encounter for general adult medical examination without abnormal findings: Secondary | ICD-10-CM | POA: Diagnosis not present

## 2021-03-31 DIAGNOSIS — Z122 Encounter for screening for malignant neoplasm of respiratory organs: Secondary | ICD-10-CM

## 2021-03-31 NOTE — Patient Instructions (Signed)
Devin Garza , Thank you for taking time to come for your Medicare Wellness Visit. I appreciate your ongoing commitment to your health goals. Please review the following plan we discussed and let me know if I can assist you in the future.   Screening recommendations/referrals: Colonoscopy:due 07/22/2025 Recommended yearly ophthalmology/optometry visit for glaucoma screening and checkup Recommended yearly dental visit for hygiene and checkup  Vaccinations: Influenza vaccine:due Fall 2022 Pneumococcal vaccine:completed the series Tdap vaccine: due 08/18/2027 Shingles vaccine: will obtain local pharmacy   Advanced directives: will provide copies  Conditions/risks identified: none  Next appointment: none  Preventive Care 18 Years and Older, Male Preventive care refers to lifestyle choices and visits with your health care provider that can promote health and wellness. What does preventive care include?  A yearly physical exam. This is also called an annual well check.  Dental exams once or twice a year.  Routine eye exams. Ask your health care provider how often you should have your eyes checked.  Personal lifestyle choices, including:  Daily care of your teeth and gums.  Regular physical activity.  Eating a healthy diet.  Avoiding tobacco and drug use.  Limiting alcohol use.  Practicing safe sex.  Taking low doses of aspirin every day.  Taking vitamin and mineral supplements as recommended by your health care provider. What happens during an annual well check? The services and screenings done by your health care provider during your annual well check will depend on your age, overall health, lifestyle risk factors, and family history of disease. Counseling  Your health care provider may ask you questions about your:  Alcohol use.  Tobacco use.  Drug use.  Emotional well-being.  Home and relationship well-being.  Sexual activity.  Eating habits.  History of  falls.  Memory and ability to understand (cognition).  Work and work Statistician. Screening  You may have the following tests or measurements:  Height, weight, and BMI.  Blood pressure.  Lipid and cholesterol levels. These may be checked every 5 years, or more frequently if you are over 1 years old.  Skin check.  Lung cancer screening. You may have this screening every year starting at age 17 if you have a 30-pack-year history of smoking and currently smoke or have quit within the past 15 years.  Fecal occult blood test (FOBT) of the stool. You may have this test every year starting at age 46.  Flexible sigmoidoscopy or colonoscopy. You may have a sigmoidoscopy every 5 years or a colonoscopy every 10 years starting at age 51.  Prostate cancer screening. Recommendations will vary depending on your family history and other risks.  Hepatitis C blood test.  Hepatitis B blood test.  Sexually transmitted disease (STD) testing.  Diabetes screening. This is done by checking your blood sugar (glucose) after you have not eaten for a while (fasting). You may have this done every 1-3 years.  Abdominal aortic aneurysm (AAA) screening. You may need this if you are a current or former smoker.  Osteoporosis. You may be screened starting at age 65 if you are at high risk. Talk with your health care provider about your test results, treatment options, and if necessary, the need for more tests. Vaccines  Your health care provider may recommend certain vaccines, such as:  Influenza vaccine. This is recommended every year.  Tetanus, diphtheria, and acellular pertussis (Tdap, Td) vaccine. You may need a Td booster every 10 years.  Zoster vaccine. You may need this after age 19.  Pneumococcal 13-valent conjugate (PCV13) vaccine. One dose is recommended after age 76.  Pneumococcal polysaccharide (PPSV23) vaccine. One dose is recommended after age 20. Talk to your health care provider about  which screenings and vaccines you need and how often you need them. This information is not intended to replace advice given to you by your health care provider. Make sure you discuss any questions you have with your health care provider. Document Released: 01/09/2016 Document Revised: 09/01/2016 Document Reviewed: 10/14/2015 Elsevier Interactive Patient Education  2017 [place] Prevention in the Home Falls can cause injuries. They can happen to people of all ages. There are many things you can do to make your home safe and to help prevent falls. What can I do on the outside of my home?  Regularly fix the edges of walkways and driveways and fix any cracks.  Remove anything that might make you trip as you walk through a door, such as a raised step or threshold.  Trim any bushes or trees on the path to your home.  Use bright outdoor lighting.  Clear any walking paths of anything that might make someone trip, such as rocks or tools.  Regularly check to see if handrails are loose or broken. Make sure that both sides of any steps have handrails.  Any raised decks and porches should have guardrails on the edges.  Have any leaves, snow, or ice cleared regularly.  Use sand or salt on walking paths during winter.  Clean up any spills in your garage right away. This includes oil or grease spills. What can I do in the bathroom?  Use night lights.  Install grab bars by the toilet and in the tub and shower. Do not use towel bars as grab bars.  Use non-skid mats or decals in the tub or shower.  If you need to sit down in the shower, use a plastic, non-slip stool.  Keep the floor dry. Clean up any water that spills on the floor as soon as it happens.  Remove soap buildup in the tub or shower regularly.  Attach bath mats securely with double-sided non-slip rug tape.  Do not have throw rugs and other things on the floor that can make you trip. What can I do in the  bedroom?  Use night lights.  Make sure that you have a light by your bed that is easy to reach.  Do not use any sheets or blankets that are too big for your bed. They should not hang down onto the floor.  Have a firm chair that has side arms. You can use this for support while you get dressed.  Do not have throw rugs and other things on the floor that can make you trip. What can I do in the kitchen?  Clean up any spills right away.  Avoid walking on wet floors.  Keep items that you use a lot in easy-to-reach places.  If you need to reach something above you, use a strong step stool that has a grab bar.  Keep electrical cords out of the way.  Do not use floor polish or wax that makes floors slippery. If you must use wax, use non-skid floor wax.  Do not have throw rugs and other things on the floor that can make you trip. What can I do with my stairs?  Do not leave any items on the stairs.  Make sure that there are handrails on both sides of the stairs and use them.  Fix handrails that are broken or loose. Make sure that handrails are as long as the stairways.  Check any carpeting to make sure that it is firmly attached to the stairs. Fix any carpet that is loose or worn.  Avoid having throw rugs at the top or bottom of the stairs. If you do have throw rugs, attach them to the floor with carpet tape.  Make sure that you have a light switch at the top of the stairs and the bottom of the stairs. If you do not have them, ask someone to add them for you. What else can I do to help prevent falls?  Wear shoes that:  Do not have high heels.  Have rubber bottoms.  Are comfortable and fit you well.  Are closed at the toe. Do not wear sandals.  If you use a stepladder:  Make sure that it is fully opened. Do not climb a closed stepladder.  Make sure that both sides of the stepladder are locked into place.  Ask someone to hold it for you, if possible.  Clearly mark and make  sure that you can see:  Any grab bars or handrails.  First and last steps.  Where the edge of each step is.  Use tools that help you move around (mobility aids) if they are needed. These include:  Canes.  Walkers.  Scooters.  Crutches.  Turn on the lights when you go into a dark area. Replace any light bulbs as soon as they burn out.  Set up your furniture so you have a clear path. Avoid moving your furniture around.  If any of your floors are uneven, fix them.  If there are any pets around you, be aware of where they are.  Review your medicines with your doctor. Some medicines can make you feel dizzy. This can increase your chance of falling. Ask your doctor what other things that you can do to help prevent falls. This information is not intended to replace advice given to you by your health care provider. Make sure you discuss any questions you have with your health care provider. Document Released: 10/09/2009 Document Revised: 05/20/2016 Document Reviewed: 01/17/2015 Elsevier Interactive Patient Education  2017 [employer].

## 2021-04-01 ENCOUNTER — Ambulatory Visit: Payer: Medicare Other | Admitting: Podiatry

## 2021-04-08 ENCOUNTER — Encounter: Payer: Self-pay | Admitting: Podiatry

## 2021-04-08 ENCOUNTER — Ambulatory Visit (INDEPENDENT_AMBULATORY_CARE_PROVIDER_SITE_OTHER): Payer: Medicare Other | Admitting: Podiatry

## 2021-04-08 ENCOUNTER — Other Ambulatory Visit: Payer: Self-pay

## 2021-04-08 DIAGNOSIS — B351 Tinea unguium: Secondary | ICD-10-CM | POA: Diagnosis not present

## 2021-04-08 DIAGNOSIS — M79609 Pain in unspecified limb: Secondary | ICD-10-CM | POA: Diagnosis not present

## 2021-04-08 DIAGNOSIS — L608 Other nail disorders: Secondary | ICD-10-CM | POA: Diagnosis not present

## 2021-04-08 DIAGNOSIS — L600 Ingrowing nail: Secondary | ICD-10-CM

## 2021-04-08 NOTE — Progress Notes (Signed)
Complaint:  Visit Type: Patient returns to my office for continued preventative foot care services. Complaint: Patient states" my nails have grown long and thick and become painful to walk and wear shoes"  The patient presents for preventative foot care services. No changes to ROS.   Podiatric Exam: Vascular: dorsalis pedis and posterior tibial pulses are palpable bilateral. Capillary return is immediate. Temperature gradient is WNL. Skin turgor WNL  Sensorium: Normal Semmes Weinstein monofilament test. Normal tactile sensation bilaterally. Nail Exam: Pt has thick disfigured discolored nails with subungual debris noted bilateral entire nail hallux through fifth toenails.  Ingrown toenail medial border hallux  B/L.  Ulcer Exam: There is no evidence of ulcer or pre-ulcerative changes or infection. Orthopedic Exam: Muscle tone and strength are WNL. No limitations in general ROM. No crepitus or effusions noted. HAV  B/L.  Hammer toe second right.   Skin: No Porokeratosis. No infection or ulcers  Diagnosis:  Onychomycosis, , Pain in right toe, pain in left toes,  Pincer nails   Treatment & Plan Procedures and Treatment: Consent by patient was obtained for treatment procedures. The patient understood the discussion of treatment and procedures well. All questions were answered thoroughly reviewed. Debridement of mycotic and hypertrophic toenails, 1 through 5 bilateral and clearing of subungual debris. No ulceration, no infection noted.  Return Visit-Office Procedure: Patient instructed to return to the office for a follow up visit 10 weeks  for continued evaluation and treatment.    Gardiner Barefoot DPM

## 2021-05-11 ENCOUNTER — Ambulatory Visit: Payer: Medicare Other | Admitting: Dermatology

## 2021-06-16 ENCOUNTER — Ambulatory Visit: Payer: Medicare Other | Admitting: Podiatry

## 2021-06-22 ENCOUNTER — Ambulatory Visit (INDEPENDENT_AMBULATORY_CARE_PROVIDER_SITE_OTHER): Payer: Medicare Other | Admitting: Podiatry

## 2021-06-22 ENCOUNTER — Encounter: Payer: Self-pay | Admitting: Podiatry

## 2021-06-22 ENCOUNTER — Other Ambulatory Visit: Payer: Self-pay

## 2021-06-22 DIAGNOSIS — M79609 Pain in unspecified limb: Secondary | ICD-10-CM | POA: Diagnosis not present

## 2021-06-22 DIAGNOSIS — L600 Ingrowing nail: Secondary | ICD-10-CM

## 2021-06-22 DIAGNOSIS — B351 Tinea unguium: Secondary | ICD-10-CM

## 2021-06-22 DIAGNOSIS — L608 Other nail disorders: Secondary | ICD-10-CM

## 2021-06-22 NOTE — Progress Notes (Signed)
Complaint:  Visit Type: Patient returns to my office for continued preventative foot care services. Complaint: Patient states" my nails have grown long and thick and become painful to walk and wear shoes"  The patient presents for preventative foot care services. No changes to ROS.   Podiatric Exam: Vascular: dorsalis pedis and posterior tibial pulses are palpable bilateral. Capillary return is immediate. Temperature gradient is WNL. Skin turgor WNL  Sensorium: Normal Semmes Weinstein monofilament test. Normal tactile sensation bilaterally. Nail Exam: Pt has thick disfigured discolored nails with subungual debris noted bilateral entire nail hallux through fifth toenails.  Ingrown toenail medial border hallux  B/L.  Ulcer Exam: There is no evidence of ulcer or pre-ulcerative changes or infection. Orthopedic Exam: Muscle tone and strength are WNL. No limitations in general ROM. No crepitus or effusions noted. HAV  B/L.  Hammer toe second right.   Skin: No Porokeratosis. No infection or ulcers  Diagnosis:  Onychomycosis, , Pain in right toe, pain in left toes,  Pincer nails   Treatment & Plan Procedures and Treatment: Consent by patient was obtained for treatment procedures. The patient understood the discussion of treatment and procedures well. All questions were answered thoroughly reviewed. Debridement of mycotic and hypertrophic toenails, 1 through 5 bilateral and clearing of subungual debris. No ulceration, no infection noted.  Return Visit-Office Procedure: Patient instructed to return to the office for a follow up visit 10 weeks  for continued evaluation and treatment.    Gardiner Barefoot DPM

## 2021-07-06 DIAGNOSIS — R251 Tremor, unspecified: Secondary | ICD-10-CM | POA: Diagnosis not present

## 2021-07-06 DIAGNOSIS — R55 Syncope and collapse: Secondary | ICD-10-CM | POA: Diagnosis not present

## 2021-07-20 ENCOUNTER — Other Ambulatory Visit: Payer: Self-pay

## 2021-07-21 ENCOUNTER — Ambulatory Visit (INDEPENDENT_AMBULATORY_CARE_PROVIDER_SITE_OTHER): Payer: Medicare Other | Admitting: Internal Medicine

## 2021-07-21 ENCOUNTER — Encounter: Payer: Self-pay | Admitting: Internal Medicine

## 2021-07-21 DIAGNOSIS — F5101 Primary insomnia: Secondary | ICD-10-CM | POA: Diagnosis not present

## 2021-07-21 DIAGNOSIS — F339 Major depressive disorder, recurrent, unspecified: Secondary | ICD-10-CM | POA: Diagnosis not present

## 2021-07-21 DIAGNOSIS — E782 Mixed hyperlipidemia: Secondary | ICD-10-CM

## 2021-07-21 DIAGNOSIS — I10 Essential (primary) hypertension: Secondary | ICD-10-CM

## 2021-07-21 MED ORDER — ESZOPICLONE 2 MG PO TABS: 2.0000 mg | ORAL_TABLET | Freq: Every evening | ORAL | 1 refills | Status: DC | PRN

## 2021-07-21 MED ORDER — MAGNESIUM OXIDE 400 MG PO TABS: ORAL_TABLET | ORAL | 1 refills | Status: AC

## 2021-07-21 MED ORDER — ATORVASTATIN CALCIUM 10 MG PO TABS: 10.0000 mg | ORAL_TABLET | Freq: Every day | ORAL | 1 refills | Status: AC

## 2021-07-21 MED ORDER — DIOVAN HCT 320-12.5 MG PO TABS: 1.0000 | ORAL_TABLET | Freq: Every day | ORAL | 1 refills | Status: DC

## 2021-07-21 MED ORDER — BUPROPION HCL ER (XL) 150 MG PO TB24: 150.0000 mg | ORAL_TABLET | Freq: Every day | ORAL | 1 refills | Status: DC

## 2021-07-21 MED ORDER — AMLODIPINE BESYLATE 10 MG PO TABS: 10.0000 mg | ORAL_TABLET | Freq: Every day | ORAL | 1 refills | Status: AC

## 2021-07-21 NOTE — Progress Notes (Signed)
Established Patient Office Visit     This visit occurred during the SARS-CoV-2 public health emergency.  Safety protocols were in place, including screening questions prior to the visit, additional usage of staff PPE, and extensive cleaning of exam room while observing appropriate contact time as indicated for disinfecting solutions.    CC/Reason for Visit: Follow-up blood pressure  HPI: Devin Garza is a 80 y.o. male who is coming in today for the above mentioned reasons.  He is here today to follow-up on his blood pressure.  At last visit his amlodipine was increased from 5 to 10 mg due to hypertension.  He is also on metoprolol succinate 50 mg daily and Diovan HCT 320/12.5 mg daily.  He claims he is compliant with his medications.  He has no concerns.  He brings in his blood pressure log that I will insert below:       Past Medical/Surgical History: Past Medical History:  Diagnosis Date   Actinic keratosis    Anemia    Basal cell adenocarcinoma 09/03/2008   Qualifier: Diagnosis of  By: Lorelei Pont MD, Frederico Hamman    Overview:  Overview:  Qualifier: Diagnosis of  By: Copland MD, [place]    Basal cell carcinoma 11/06/2008   Right mastoid/neck.    Basal cell carcinoma 11/13/2008   Left lateral nose medial infraorbital ~2cm lat. to med. canthus. Excised 01/08/2009, margins free.   Basal cell carcinoma 10/09/2009   Right upper back, paraspinal inf. to base of neck. Excised 12/03/2009   Basal cell carcinoma 06/11/2013   Left lateral forehead. Excised 07/18/2013, margins free.   Basal cell carcinoma 07/30/2015   Right nasal ala. Nodular pattern.   Basal cell carcinoma 07/30/2015   Right mid to sup. helix. Nodular.   Basal cell carcinoma 08/25/2016   Right mid to sup. helix. Superficial with focal infiltration. Excised 09/28/2016, margins free.   Basal cell carcinoma 03/16/2017   Right mid to sup. helix. Mixed pattern, ulcerated   Basal cell carcinoma 05/10/2018   Right nasal tip. BCC  with focal sclerosis.   Basal cell carcinoma of lip 2007   multiple sites   Blood transfusion without reported diagnosis    ED (erectile dysfunction)    GERD (gastroesophageal reflux disease)    HLD (hyperlipidemia)    Hx of squamous cell carcinoma of skin 08/25/2016   L superior pectoral   Hypertension    Osteoarthritis    Peripheral vascular disease ([place])    Occlusion and Stenosis of Carotid Artery   Squamous cell carcinoma of skin 08/25/2016   Left superior pectoral. KA pattern. EDC.   Tobacco abuse    Tubular adenoma of colon 2005   Wears dentures    full upper and lower    Past Surgical History:  Procedure Laterality Date   ANKLE ARTHROSCOPY W/ OPEN REPAIR Left    COLONOSCOPY  2006   [place]-Tubular adenoma   COLONOSCOPY N/A 07/23/2015   Procedure: COLONOSCOPY;  Surgeon: Lucilla Lame, MD;  Location: [facility];  Service: Gastroenterology;  Laterality: N/A;  WITH BIOPSIES---- TRANSVERSE COLON POLYP DESCENDING COLON POLYP  X  4   ESOPHAGOGASTRODUODENOSCOPY N/A 07/23/2015   Procedure: ESOPHAGOGASTRODUODENOSCOPY (EGD);  Surgeon: Lucilla Lame, MD;  Location: [facility];  Service: Gastroenterology;  Laterality: N/A;  WITH BIOPSY--- DUODENAL BIOPSY GASTRIC BIOPSY   ESOPHAGOGASTRODUODENOSCOPY N/A 10/28/2016   Procedure: ESOPHAGOGASTRODUODENOSCOPY (EGD);  Surgeon: Manya Silvas, MD;  Location: [facility] ENDOSCOPY;  Service: Endoscopy;  Laterality: N/A;   ESOPHAGOGASTRODUODENOSCOPY (EGD) WITH  PROPOFOL N/A 09/02/2017   Procedure: ESOPHAGOGASTRODUODENOSCOPY (EGD) WITH PROPOFOL;  Surgeon: Manya Silvas, MD;  Location: [facility] ENDOSCOPY;  Service: Endoscopy;  Laterality: N/A;   SKIN CANCER EXCISION     LIP   TONSILLECTOMY     age 20   UPPER GI ENDOSCOPY  12/2009   Dr. Ivory Broad gastritisl negative for H. pylori; duodenal biopsies neg for sprue    Social History:  reports that he quit smoking about 16 months ago. His smoking use included cigarettes. He has a 20.00  pack-year smoking history. He has never used smokeless tobacco. He reports current alcohol use of about 14.0 standard drinks of alcohol per week. He reports that he does not use drugs.  Allergies: No Known Allergies  Family History:  Family History  Problem Relation Age of Onset   Lung cancer Mother 36   Hearing loss Father    Throat cancer Father    Heart attack Father    CAD Father    Ovarian cancer Sister      Current Outpatient Medications:    colchicine 0.6 MG tablet, TAKE 1 TABLET BY MOUTH 2 TIMES DAILY., Disp: 180 tablet, Rfl: 1   Diclofenac Sodium 1.5 % SOLN, Apply 1 mL topically 4 (four) times daily as needed., Disp: 450 mL, Rfl: 5   esomeprazole (NEXIUM 24HR) 20 MG capsule, Take 2 capsules (40 mg total) by mouth 2 (two) times daily before a meal., Disp: 120 capsule, Rfl: 0   loratadine (CLARITIN) 10 MG tablet, Take 1 tablet (10 mg total) by mouth daily as needed for allergies., Disp: 30 tablet, Rfl: 0   metoprolol succinate (TOPROL-XL) 50 MG 24 hr tablet, TAKE 1 TABLET BY MOUTH DAILY. TAKE WITH OR IMMEDIATELY FOLLOWING A MEAL., Disp: 90 tablet, Rfl: 1   Multiple Vitamin (MULTIVITAMIN) tablet, Take 1 tablet by mouth daily., Disp: , Rfl:    amLODipine (NORVASC) 10 MG tablet, Take 1 tablet (10 mg total) by mouth daily., Disp: 90 tablet, Rfl: 1   atorvastatin (LIPITOR) 10 MG tablet, Take 1 tablet (10 mg total) by mouth daily., Disp: 90 tablet, Rfl: 1   buPROPion (WELLBUTRIN XL) 150 MG 24 hr tablet, Take 1 tablet (150 mg total) by mouth daily., Disp: 90 tablet, Rfl: 1   DIOVAN HCT 320-12.5 MG tablet, Take 1 tablet by mouth daily., Disp: 90 tablet, Rfl: 1   eszopiclone (LUNESTA) 2 MG TABS tablet, Take 1 tablet (2 mg total) by mouth at bedtime as needed for sleep. Take immediately before bedtime, Disp: 90 tablet, Rfl: 1   magnesium oxide (MAG-OX) 400 MG tablet, Take 1 tablet by mouth morning, noon and at bedtime, Disp: 270 tablet, Rfl: 1  Review of Systems:  Constitutional: Denies  fever, chills, diaphoresis, appetite change and fatigue.  HEENT: Denies photophobia, eye pain, redness, hearing loss, ear pain, congestion, sore throat, rhinorrhea, sneezing, mouth sores, trouble swallowing, neck pain, neck stiffness and tinnitus.   Respiratory: Denies SOB, DOE, cough, chest tightness,  and wheezing.   Cardiovascular: Denies chest pain, palpitations and leg swelling.  Gastrointestinal: Denies nausea, vomiting, abdominal pain, diarrhea, constipation, blood in stool and abdominal distention.  Genitourinary: Denies dysuria, urgency, frequency, hematuria, flank pain and difficulty urinating.  Endocrine: Denies: hot or cold intolerance, sweats, changes in hair or nails, polyuria, polydipsia. Musculoskeletal: Denies myalgias, back pain, joint swelling, arthralgias and gait problem.  Skin: Denies pallor, rash and wound.  Neurological: Denies dizziness, seizures, syncope, weakness, light-headedness, numbness and headaches.  Hematological: Denies adenopathy. Easy bruising, personal or  family bleeding history  Psychiatric/Behavioral: Denies suicidal ideation, mood changes, confusion, nervousness, sleep disturbance and agitation    Physical Exam: Vitals:   07/21/21 1540  BP: 140/80  Pulse: 84  Temp: 98.1 F (36.7 C)  TempSrc: Oral  SpO2: 99%  Weight: 177 lb 3.2 oz (80.4 kg)    Body mass index is 26.94 kg/m.   Constitutional: NAD, calm, comfortable, ambulates with a walker Eyes: PERRL, lids and conjunctivae normal, wears corrective lenses ENMT: Mucous membranes are moist.  Respiratory: clear to auscultation bilaterally, no wheezing, no crackles. Normal respiratory effort. No accessory muscle use.  Cardiovascular: Regular rate and rhythm, no murmurs / rubs / gallops. No extremity edema. Neurologic: Grossly intact and nonfocal Psychiatric: Normal judgment and insight. Alert and oriented x 3. Normal mood.    Impression and Plan:  Hypomagnesemia  - Plan: magnesium oxide  (MAG-OX) 400 MG tablet  Essential hypertension  -Most recent blood pressure measurements appear to be in the 130/70-80 range. -I will go ahead and hold off on making any changes today. -He will return in 3 months with continued ambulatory blood pressure measurements.  Mixed hyperlipidemia  - Plan: atorvastatin (LIPITOR) 10 MG tablet -Last lipid panel in February 2021 with total cholesterol of 190, triglycerides 56 and LDL 93. -Recheck lipids when he returns for CPE.  Major depression, recurrent, chronic ([place])  - Plan: buPROPion (WELLBUTRIN XL) 150 MG 24 hr tablet -Mood appears stable  Primary insomnia  - Plan: eszopiclone (LUNESTA) 2 MG TABS tablet  Time spent: 32 minutes reviewing chart, interviewing and examining patient and formulating plan of care.   Patient Instructions  -Nice seeing you today!!  -Schedule follow up in 3-4 months for your physical. Please come in fasting that day.    Lelon Frohlich, MD [facility]

## 2021-07-21 NOTE — Patient Instructions (Signed)
-  Nice seeing you today!!  -Schedule follow up in 3-4 months for your physical. Please come in fasting that day.

## 2021-08-19 DIAGNOSIS — Z961 Presence of intraocular lens: Secondary | ICD-10-CM | POA: Diagnosis not present

## 2021-08-19 DIAGNOSIS — H5211 Myopia, right eye: Secondary | ICD-10-CM | POA: Diagnosis not present

## 2021-08-19 DIAGNOSIS — H35373 Puckering of macula, bilateral: Secondary | ICD-10-CM | POA: Diagnosis not present

## 2021-08-19 DIAGNOSIS — H52223 Regular astigmatism, bilateral: Secondary | ICD-10-CM | POA: Diagnosis not present

## 2021-09-02 ENCOUNTER — Encounter: Payer: Self-pay | Admitting: Podiatry

## 2021-09-02 ENCOUNTER — Other Ambulatory Visit: Payer: Self-pay

## 2021-09-02 ENCOUNTER — Ambulatory Visit (INDEPENDENT_AMBULATORY_CARE_PROVIDER_SITE_OTHER): Payer: Medicare Other | Admitting: Podiatry

## 2021-09-02 DIAGNOSIS — L608 Other nail disorders: Secondary | ICD-10-CM

## 2021-09-02 DIAGNOSIS — M79609 Pain in unspecified limb: Secondary | ICD-10-CM | POA: Diagnosis not present

## 2021-09-02 DIAGNOSIS — B351 Tinea unguium: Secondary | ICD-10-CM

## 2021-09-02 DIAGNOSIS — L600 Ingrowing nail: Secondary | ICD-10-CM | POA: Diagnosis not present

## 2021-09-02 NOTE — Progress Notes (Signed)
Complaint:  Visit Type: Patient returns to my office for continued preventative foot care services. Complaint: Patient states" my nails have grown long and thick and become painful to walk and wear shoes"  The patient presents for preventative foot care services. No changes to ROS.   Podiatric Exam: Vascular: dorsalis pedis and posterior tibial pulses are palpable bilateral. Capillary return is immediate. Temperature gradient is WNL. Skin turgor WNL  Sensorium: Normal Semmes Weinstein monofilament test. Normal tactile sensation bilaterally. Nail Exam: Pt has thick disfigured discolored nails with subungual debris noted bilateral entire nail hallux through fifth toenails.  Ingrown toenail medial border hallux  B/L.  Ulcer Exam: There is no evidence of ulcer or pre-ulcerative changes or infection. Orthopedic Exam: Muscle tone and strength are WNL. No limitations in general ROM. No crepitus or effusions noted. HAV  B/L.  Hammer toe second right.   Skin: No Porokeratosis. No infection or ulcers  Diagnosis:  Onychomycosis, , Pain in right toe, pain in left toes,  Pincer nails   Treatment & Plan Procedures and Treatment: Consent by patient was obtained for treatment procedures. The patient understood the discussion of treatment and procedures well. All questions were answered thoroughly reviewed. Debridement of mycotic and hypertrophic toenails, 1 through 5 bilateral and clearing of subungual debris. No ulceration, no infection noted.  Return Visit-Office Procedure: Patient instructed to return to the office for a follow up visit 10 weeks  for continued evaluation and treatment.    Gardiner Barefoot DPM

## 2021-10-05 DIAGNOSIS — Z23 Encounter for immunization: Secondary | ICD-10-CM | POA: Diagnosis not present

## 2021-10-14 ENCOUNTER — Other Ambulatory Visit: Payer: Self-pay | Admitting: Internal Medicine

## 2021-10-14 DIAGNOSIS — I10 Essential (primary) hypertension: Secondary | ICD-10-CM

## 2021-12-04 ENCOUNTER — Ambulatory Visit (INDEPENDENT_AMBULATORY_CARE_PROVIDER_SITE_OTHER): Payer: Medicare Other | Admitting: Podiatry

## 2021-12-04 ENCOUNTER — Encounter: Payer: Self-pay | Admitting: Podiatry

## 2021-12-04 ENCOUNTER — Other Ambulatory Visit: Payer: Self-pay

## 2021-12-04 DIAGNOSIS — B351 Tinea unguium: Secondary | ICD-10-CM

## 2021-12-04 DIAGNOSIS — L608 Other nail disorders: Secondary | ICD-10-CM

## 2021-12-04 DIAGNOSIS — M79676 Pain in unspecified toe(s): Secondary | ICD-10-CM | POA: Diagnosis not present

## 2021-12-04 DIAGNOSIS — L600 Ingrowing nail: Secondary | ICD-10-CM

## 2021-12-04 NOTE — Progress Notes (Signed)
Complaint:  Visit Type: Patient returns to my office for continued preventative foot care services. Complaint: Patient states" my nails have grown long and thick and become painful to walk and wear shoes"  The patient presents for preventative foot care services. No changes to ROS.   Podiatric Exam: Vascular: dorsalis pedis and posterior tibial pulses are palpable bilateral. Capillary return is immediate. Temperature gradient is WNL. Skin turgor WNL  Sensorium: Normal Semmes Weinstein monofilament test. Normal tactile sensation bilaterally. Nail Exam: Pt has thick disfigured discolored nails with subungual debris noted bilateral entire nail hallux through fifth toenails.  Ingrown toenail medial border hallux  B/L.  Ulcer Exam: There is no evidence of ulcer or pre-ulcerative changes or infection. Orthopedic Exam: Muscle tone and strength are WNL. No limitations in general ROM. No crepitus or effusions noted. HAV  B/L.  Hammer toe second right.   Skin: No Porokeratosis. No infection or ulcers  Diagnosis:  Onychomycosis, , Pain in right toe, pain in left toes,  Pincer nails   Treatment & Plan Procedures and Treatment: Consent by patient was obtained for treatment procedures. The patient understood the discussion of treatment and procedures well. All questions were answered thoroughly reviewed. Debridement of mycotic and hypertrophic toenails, 1 through 5 bilateral and clearing of subungual debris. No ulceration, no infection noted.  Return Visit-Office Procedure: Patient instructed to return to the office for a follow up visit 10 weeks  for continued evaluation and treatment.    Gardiner Barefoot DPM

## 2021-12-10 ENCOUNTER — Ambulatory Visit (INDEPENDENT_AMBULATORY_CARE_PROVIDER_SITE_OTHER): Payer: Medicare Other | Admitting: Internal Medicine

## 2021-12-10 ENCOUNTER — Encounter: Payer: Self-pay | Admitting: Internal Medicine

## 2021-12-10 DIAGNOSIS — F339 Major depressive disorder, recurrent, unspecified: Secondary | ICD-10-CM

## 2021-12-10 DIAGNOSIS — F5101 Primary insomnia: Secondary | ICD-10-CM | POA: Diagnosis not present

## 2021-12-10 DIAGNOSIS — I10 Essential (primary) hypertension: Secondary | ICD-10-CM

## 2021-12-10 MED ORDER — ESZOPICLONE 2 MG PO TABS: 2.0000 mg | ORAL_TABLET | Freq: Every evening | ORAL | 1 refills | Status: DC | PRN

## 2021-12-10 MED ORDER — METOPROLOL SUCCINATE ER 100 MG PO TB24: ORAL_TABLET | ORAL | 1 refills | Status: DC

## 2021-12-10 MED ORDER — DIOVAN HCT 320-12.5 MG PO TABS: 1.0000 | ORAL_TABLET | Freq: Every day | ORAL | 1 refills | Status: DC

## 2021-12-10 MED ORDER — BUPROPION HCL ER (XL) 150 MG PO TB24: 150.0000 mg | ORAL_TABLET | Freq: Every day | ORAL | 1 refills | Status: DC

## 2021-12-10 NOTE — Progress Notes (Signed)
Established Patient Office Visit     This visit occurred during the SARS-CoV-2 public health emergency.  Safety protocols were in place, including screening questions prior to the visit, additional usage of staff PPE, and extensive cleaning of exam room while observing appropriate contact time as indicated for disinfecting solutions.    CC/Reason for Visit: Follow-up blood pressure  HPI: Devin Garza is a 80 y.o. male who is coming in today for the above mentioned reasons.  He is here today to follow-up on his blood pressure.  He is currently on Diovan HCT 320/12.5 mg, amlodipine 10 mg and metoprolol XL 50 mg daily.  He insists that he is compliant with medication.  He has no acute concerns today.  Past Medical/Surgical History: Past Medical History:  Diagnosis Date   Actinic keratosis    Anemia    Basal cell adenocarcinoma 09/03/2008   Qualifier: Diagnosis of  By: Copland MD, Frederico Hamman    Overview:  Overview:  Qualifier: Diagnosis of  By: Copland MD, Spencer    Basal cell carcinoma 11/06/2008   Right mastoid/neck.    Basal cell carcinoma 11/13/2008   Left lateral nose medial infraorbital ~2cm lat. to med. canthus. Excised 01/08/2009, margins free.   Basal cell carcinoma 10/09/2009   Right upper back, paraspinal inf. to base of neck. Excised 12/03/2009   Basal cell carcinoma 06/11/2013   Left lateral forehead. Excised 07/18/2013, margins free.   Basal cell carcinoma 07/30/2015   Right nasal ala. Nodular pattern.   Basal cell carcinoma 07/30/2015   Right mid to sup. helix. Nodular.   Basal cell carcinoma 08/25/2016   Right mid to sup. helix. Superficial with focal infiltration. Excised 09/28/2016, margins free.   Basal cell carcinoma 03/16/2017   Right mid to sup. helix. Mixed pattern, ulcerated   Basal cell carcinoma 05/10/2018   Right nasal tip. BCC with focal sclerosis.   Basal cell carcinoma of lip 2007   multiple sites   Blood transfusion without reported diagnosis    ED  (erectile dysfunction)    GERD (gastroesophageal reflux disease)    HLD (hyperlipidemia)    Hx of squamous cell carcinoma of skin 08/25/2016   L superior pectoral   Hypertension    Osteoarthritis    Peripheral vascular disease ([place])    Occlusion and Stenosis of Carotid Artery   Squamous cell carcinoma of skin 08/25/2016   Left superior pectoral. KA pattern. EDC.   Tobacco abuse    Tubular adenoma of colon 2005   Wears dentures    full upper and lower    Past Surgical History:  Procedure Laterality Date   ANKLE ARTHROSCOPY W/ OPEN REPAIR Left    COLONOSCOPY  2006   [place]-Tubular adenoma   COLONOSCOPY N/A 07/23/2015   Procedure: COLONOSCOPY;  Surgeon: Lucilla Lame, MD;  Location: [facility];  Service: Gastroenterology;  Laterality: N/A;  WITH BIOPSIES---- TRANSVERSE COLON POLYP DESCENDING COLON POLYP  X  4   ESOPHAGOGASTRODUODENOSCOPY N/A 07/23/2015   Procedure: ESOPHAGOGASTRODUODENOSCOPY (EGD);  Surgeon: Lucilla Lame, MD;  Location: [facility];  Service: Gastroenterology;  Laterality: N/A;  WITH BIOPSY--- DUODENAL BIOPSY GASTRIC BIOPSY   ESOPHAGOGASTRODUODENOSCOPY N/A 10/28/2016   Procedure: ESOPHAGOGASTRODUODENOSCOPY (EGD);  Surgeon: Manya Silvas, MD;  Location: [facility] ENDOSCOPY;  Service: Endoscopy;  Laterality: N/A;   ESOPHAGOGASTRODUODENOSCOPY (EGD) WITH PROPOFOL N/A 09/02/2017   Procedure: ESOPHAGOGASTRODUODENOSCOPY (EGD) WITH PROPOFOL;  Surgeon: Manya Silvas, MD;  Location: [facility] ENDOSCOPY;  Service: Endoscopy;  Laterality: N/A;   SKIN CANCER  EXCISION     LIP   TONSILLECTOMY     age 66   UPPER GI ENDOSCOPY  12/2009   Dr. Ivory Broad gastritisl negative for H. pylori; duodenal biopsies neg for sprue    Social History:  reports that he quit smoking about 21 months ago. His smoking use included cigarettes. He has a 20.00 pack-year smoking history. He has never used smokeless tobacco. He reports current alcohol use of about 14.0 standard drinks per week.  He reports that he does not use drugs.  Allergies: No Known Allergies  Family History:  Family History  Problem Relation Age of Onset   Lung cancer Mother 82   Hearing loss Father    Throat cancer Father    Heart attack Father    CAD Father    Ovarian cancer Sister      Current Outpatient Medications:    amLODipine (NORVASC) 10 MG tablet, Take 1 tablet (10 mg total) by mouth daily., Disp: 90 tablet, Rfl: 1   atorvastatin (LIPITOR) 10 MG tablet, Take 1 tablet (10 mg total) by mouth daily., Disp: 90 tablet, Rfl: 1   colchicine 0.6 MG tablet, TAKE 1 TABLET BY MOUTH 2 TIMES DAILY., Disp: 180 tablet, Rfl: 1   Diclofenac Sodium 1.5 % SOLN, Apply 1 mL topically 4 (four) times daily as needed., Disp: 450 mL, Rfl: 5   esomeprazole (NEXIUM 24HR) 20 MG capsule, Take 2 capsules (40 mg total) by mouth 2 (two) times daily before a meal., Disp: 120 capsule, Rfl: 0   loratadine (CLARITIN) 10 MG tablet, Take 1 tablet (10 mg total) by mouth daily as needed for allergies., Disp: 30 tablet, Rfl: 0   magnesium oxide (MAG-OX) 400 MG tablet, Take 1 tablet by mouth morning, noon and at bedtime, Disp: 270 tablet, Rfl: 1   Multiple Vitamin (MULTIVITAMIN) tablet, Take 1 tablet by mouth daily., Disp: , Rfl:    buPROPion (WELLBUTRIN XL) 150 MG 24 hr tablet, Take 1 tablet (150 mg total) by mouth daily., Disp: 90 tablet, Rfl: 1   DIOVAN HCT 320-12.5 MG tablet, Take 1 tablet by mouth daily., Disp: 90 tablet, Rfl: 1   eszopiclone (LUNESTA) 2 MG TABS tablet, Take 1 tablet (2 mg total) by mouth at bedtime as needed for sleep. Take immediately before bedtime, Disp: 90 tablet, Rfl: 1   metoprolol succinate (TOPROL-XL) 100 MG 24 hr tablet, TAKE 1 TABLET DAILY  TAKE WITH OR IMMEDIATELY FOLLOWING A MEAL, Disp: 90 tablet, Rfl: 1  Review of Systems:  Constitutional: Denies fever, chills, diaphoresis, appetite change and fatigue.  HEENT: Denies photophobia, eye pain, redness, hearing loss, ear pain, congestion, sore throat,  rhinorrhea, sneezing, mouth sores, trouble swallowing, neck pain, neck stiffness and tinnitus.   Respiratory: Denies SOB, DOE, cough, chest tightness,  and wheezing.   Cardiovascular: Denies chest pain, palpitations and leg swelling.  Gastrointestinal: Denies nausea, vomiting, abdominal pain, diarrhea, constipation, blood in stool and abdominal distention.  Genitourinary: Denies dysuria, urgency, frequency, hematuria, flank pain and difficulty urinating.  Endocrine: Denies: hot or cold intolerance, sweats, changes in hair or nails, polyuria, polydipsia. Musculoskeletal: Denies myalgias, back pain, joint swelling, arthralgias and gait problem.  Skin: Denies pallor, rash and wound.  Neurological: Denies dizziness, seizures, syncope, weakness, light-headedness, numbness and headaches.  Hematological: Denies adenopathy. Easy bruising, personal or family bleeding history  Psychiatric/Behavioral: Denies suicidal ideation, mood changes, confusion, nervousness, sleep disturbance and agitation    Physical Exam: Vitals:   12/10/21 1319  BP: (!) 142/82  Pulse: 95  Temp: 98.3 F (36.8 C)  TempSrc: Oral  SpO2: 96%  Weight: 178 lb 11.2 oz (81.1 kg)  Height: 5\' 8"  (1.727 m)    Body mass index is 27.17 kg/m.   Constitutional: NAD, calm, comfortable, ambulates with a walker Eyes: PERRL, lids and conjunctivae normal, wears corrective lenses ENMT: Mucous membranes are moist.  Respiratory: clear to auscultation bilaterally, no wheezing, no crackles. Normal respiratory effort. No accessory muscle use.  Cardiovascular: Regular rate and rhythm, no murmurs / rubs / gallops. No extremity edema.  Neurologic grossly intact and nonfocal Psychiatric: Normal judgment and insight. Alert and oriented x 3. Normal mood.    Impression and Plan:  Major depression, recurrent, chronic (HCC)  - Plan: buPROPion (WELLBUTRIN XL) 150 MG 24 hr tablet  Primary insomnia  - Plan: eszopiclone (LUNESTA) 2 MG TABS  tablet  Essential hypertension  - Plan: DIOVAN HCT 320-12.5 MG tablet, metoprolol succinate (TOPROL-XL) 100 MG 24 hr tablet -Blood pressure is not well controlled, will increase metoprolol XL from 50 to 100 mg daily, continue amlodipine and Diovan HCT as is. -He will follow-up in 8 to 12 weeks.  Time spent: 22 minutes reviewing chart, interviewing and examining patient and formulating plan of care.   Patient Instructions  -Nice seeing you today!!  -Increase metoprolol to 100 mg daily (ok to take two of the 50 mg tablets).  -Schedule follow up for your physical in 3 months. Please come in fasting that day for labs.   Lelon Frohlich, MD [facility]

## 2021-12-10 NOTE — Patient Instructions (Signed)
-  Nice seeing you today!!  -Increase metoprolol to 100 mg daily (ok to take two of the 50 mg tablets).  -Schedule follow up for your physical in 3 months. Please come in fasting that day for labs.

## 2022-01-22 ENCOUNTER — Emergency Department (HOSPITAL_COMMUNITY): Payer: Medicare Other

## 2022-01-22 ENCOUNTER — Inpatient Hospital Stay (HOSPITAL_COMMUNITY)
Admission: EM | Admit: 2022-01-22 | Discharge: 2022-01-29 | DRG: 640 | Disposition: A | Discharge status: Other Acute Inpatient Hospital | Payer: Medicare Other | Attending: Pulmonary Disease | Admitting: Pulmonary Disease

## 2022-01-22 ENCOUNTER — Other Ambulatory Visit: Payer: Self-pay

## 2022-01-22 ENCOUNTER — Encounter (HOSPITAL_COMMUNITY): Payer: Self-pay

## 2022-01-22 DIAGNOSIS — I6529 Occlusion and stenosis of unspecified carotid artery: Secondary | ICD-10-CM | POA: Diagnosis present

## 2022-01-22 DIAGNOSIS — D649 Anemia, unspecified: Secondary | ICD-10-CM | POA: Diagnosis present

## 2022-01-22 DIAGNOSIS — E782 Mixed hyperlipidemia: Secondary | ICD-10-CM | POA: Diagnosis not present

## 2022-01-22 DIAGNOSIS — M25571 Pain in right ankle and joints of right foot: Secondary | ICD-10-CM | POA: Diagnosis present

## 2022-01-22 DIAGNOSIS — G9340 Encephalopathy, unspecified: Secondary | ICD-10-CM | POA: Diagnosis not present

## 2022-01-22 DIAGNOSIS — J9601 Acute respiratory failure with hypoxia: Secondary | ICD-10-CM

## 2022-01-22 DIAGNOSIS — E860 Dehydration: Secondary | ICD-10-CM | POA: Diagnosis present

## 2022-01-22 DIAGNOSIS — R410 Disorientation, unspecified: Secondary | ICD-10-CM | POA: Diagnosis not present

## 2022-01-22 DIAGNOSIS — Z85828 Personal history of other malignant neoplasm of skin: Secondary | ICD-10-CM

## 2022-01-22 DIAGNOSIS — R296 Repeated falls: Secondary | ICD-10-CM | POA: Diagnosis present

## 2022-01-22 DIAGNOSIS — R001 Bradycardia, unspecified: Secondary | ICD-10-CM | POA: Diagnosis present

## 2022-01-22 DIAGNOSIS — I10 Essential (primary) hypertension: Secondary | ICD-10-CM | POA: Diagnosis present

## 2022-01-22 DIAGNOSIS — R52 Pain, unspecified: Secondary | ICD-10-CM

## 2022-01-22 DIAGNOSIS — Z87891 Personal history of nicotine dependence: Secondary | ICD-10-CM

## 2022-01-22 DIAGNOSIS — I6523 Occlusion and stenosis of bilateral carotid arteries: Secondary | ICD-10-CM | POA: Diagnosis not present

## 2022-01-22 DIAGNOSIS — Z79899 Other long term (current) drug therapy: Secondary | ICD-10-CM

## 2022-01-22 DIAGNOSIS — F10231 Alcohol dependence with withdrawal delirium: Secondary | ICD-10-CM | POA: Diagnosis present

## 2022-01-22 DIAGNOSIS — R197 Diarrhea, unspecified: Secondary | ICD-10-CM | POA: Diagnosis not present

## 2022-01-22 DIAGNOSIS — R0602 Shortness of breath: Secondary | ICD-10-CM | POA: Diagnosis not present

## 2022-01-22 DIAGNOSIS — M109 Gout, unspecified: Secondary | ICD-10-CM | POA: Diagnosis present

## 2022-01-22 DIAGNOSIS — N179 Acute kidney failure, unspecified: Secondary | ICD-10-CM | POA: Diagnosis not present

## 2022-01-22 DIAGNOSIS — R630 Anorexia: Secondary | ICD-10-CM

## 2022-01-22 DIAGNOSIS — I2510 Atherosclerotic heart disease of native coronary artery without angina pectoris: Secondary | ICD-10-CM | POA: Diagnosis present

## 2022-01-22 DIAGNOSIS — R627 Adult failure to thrive: Secondary | ICD-10-CM | POA: Diagnosis present

## 2022-01-22 DIAGNOSIS — R339 Retention of urine, unspecified: Secondary | ICD-10-CM | POA: Diagnosis not present

## 2022-01-22 DIAGNOSIS — L89891 Pressure ulcer of other site, stage 1: Secondary | ICD-10-CM | POA: Diagnosis not present

## 2022-01-22 DIAGNOSIS — R778 Other specified abnormalities of plasma proteins: Secondary | ICD-10-CM | POA: Diagnosis present

## 2022-01-22 DIAGNOSIS — R0902 Hypoxemia: Secondary | ICD-10-CM

## 2022-01-22 DIAGNOSIS — I959 Hypotension, unspecified: Secondary | ICD-10-CM | POA: Diagnosis not present

## 2022-01-22 DIAGNOSIS — R569 Unspecified convulsions: Secondary | ICD-10-CM | POA: Diagnosis not present

## 2022-01-22 DIAGNOSIS — Z8249 Family history of ischemic heart disease and other diseases of the circulatory system: Secondary | ICD-10-CM

## 2022-01-22 DIAGNOSIS — Z6825 Body mass index (BMI) 25.0-25.9, adult: Secondary | ICD-10-CM

## 2022-01-22 DIAGNOSIS — E876 Hypokalemia: Secondary | ICD-10-CM | POA: Diagnosis not present

## 2022-01-22 DIAGNOSIS — I739 Peripheral vascular disease, unspecified: Secondary | ICD-10-CM | POA: Diagnosis present

## 2022-01-22 DIAGNOSIS — R195 Other fecal abnormalities: Secondary | ICD-10-CM

## 2022-01-22 DIAGNOSIS — J690 Pneumonitis due to inhalation of food and vomit: Secondary | ICD-10-CM | POA: Diagnosis not present

## 2022-01-22 DIAGNOSIS — R531 Weakness: Secondary | ICD-10-CM

## 2022-01-22 DIAGNOSIS — M6282 Rhabdomyolysis: Secondary | ICD-10-CM | POA: Diagnosis present

## 2022-01-22 DIAGNOSIS — R1310 Dysphagia, unspecified: Secondary | ICD-10-CM | POA: Diagnosis present

## 2022-01-22 DIAGNOSIS — K921 Melena: Secondary | ICD-10-CM | POA: Diagnosis present

## 2022-01-22 DIAGNOSIS — K219 Gastro-esophageal reflux disease without esophagitis: Secondary | ICD-10-CM | POA: Diagnosis present

## 2022-01-22 DIAGNOSIS — R000 Tachycardia, unspecified: Secondary | ICD-10-CM | POA: Diagnosis not present

## 2022-01-22 DIAGNOSIS — E440 Moderate protein-calorie malnutrition: Secondary | ICD-10-CM | POA: Insufficient documentation

## 2022-01-22 DIAGNOSIS — Z20822 Contact with and (suspected) exposure to covid-19: Secondary | ICD-10-CM | POA: Diagnosis present

## 2022-01-22 DIAGNOSIS — I491 Atrial premature depolarization: Secondary | ICD-10-CM | POA: Diagnosis not present

## 2022-01-22 LAB — COMPREHENSIVE METABOLIC PANEL
ALT: 32 U/L (ref 0–44)
AST: 67 U/L — ABNORMAL HIGH (ref 15–41)
Albumin: 3.2 g/dL — ABNORMAL LOW (ref 3.5–5.0)
Alkaline Phosphatase: 51 U/L (ref 38–126)
Anion gap: 20 — ABNORMAL HIGH (ref 5–15)
BUN: 40 mg/dL — ABNORMAL HIGH (ref 8–23)
CO2: 13 mmol/L — ABNORMAL LOW (ref 22–32)
Calcium: 5.4 mg/dL — CL (ref 8.9–10.3)
Chloride: 106 mmol/L (ref 98–111)
Creatinine, Ser: 2.65 mg/dL — ABNORMAL HIGH (ref 0.61–1.24)
GFR, Estimated: 23 mL/min — ABNORMAL LOW (ref >60)
Glucose, Bld: 110 mg/dL — ABNORMAL HIGH (ref 70–99)
Potassium: 2.9 mmol/L — ABNORMAL LOW (ref 3.5–5.1)
Sodium: 139 mmol/L (ref 135–145)
Total Bilirubin: 0.8 mg/dL (ref 0.3–1.2)
Total Protein: 6.6 g/dL (ref 6.5–8.1)

## 2022-01-22 LAB — CBC WITH DIFFERENTIAL/PLATELET
Abs Immature Granulocytes: 0.02 10*3/uL (ref 0.00–0.07)
Basophils Absolute: 0 10*3/uL (ref 0.0–0.1)
Basophils Relative: 0 %
Eosinophils Absolute: 0 10*3/uL (ref 0.0–0.5)
Eosinophils Relative: 0 %
HCT: 32.4 % — ABNORMAL LOW (ref 39.0–52.0)
Hemoglobin: 11 g/dL — ABNORMAL LOW (ref 13.0–17.0)
Immature Granulocytes: 0 %
Lymphocytes Relative: 13 %
Lymphs Abs: 1.1 10*3/uL (ref 0.7–4.0)
MCH: 31.3 pg (ref 26.0–34.0)
MCHC: 34 g/dL (ref 30.0–36.0)
MCV: 92.3 fL (ref 80.0–100.0)
Monocytes Absolute: 1.5 10*3/uL — ABNORMAL HIGH (ref 0.1–1.0)
Monocytes Relative: 17 %
Neutro Abs: 5.9 10*3/uL (ref 1.7–7.7)
Neutrophils Relative %: 70 %
Platelets: 243 10*3/uL (ref 150–400)
RBC: 3.51 MIL/uL — ABNORMAL LOW (ref 4.22–5.81)
RDW: 12.5 % (ref 11.5–15.5)
WBC: 8.6 10*3/uL (ref 4.0–10.5)
nRBC: 0 % (ref 0.0–0.2)

## 2022-01-22 LAB — BRAIN NATRIURETIC PEPTIDE: B Natriuretic Peptide: 108.5 pg/mL — ABNORMAL HIGH (ref 0.0–100.0)

## 2022-01-22 LAB — RESP PANEL BY RT-PCR (FLU A&B, COVID) ARPGX2
Influenza A by PCR: NEGATIVE
Influenza B by PCR: NEGATIVE
SARS Coronavirus 2 by RT PCR: NEGATIVE

## 2022-01-22 LAB — POC OCCULT BLOOD, ED: Fecal Occult Bld: POSITIVE — AB

## 2022-01-22 LAB — TROPONIN I (HIGH SENSITIVITY): Troponin I (High Sensitivity): 154 ng/L (ref <18)

## 2022-01-22 IMAGING — DX DG CHEST 1V PORT
1 series · 1 of 1 positions shown · non-contrast
Comparison: [DATE].

CLINICAL DATA: Shortness of breath.

EXAM:
PORTABLE CHEST 1 VIEW

[chest]
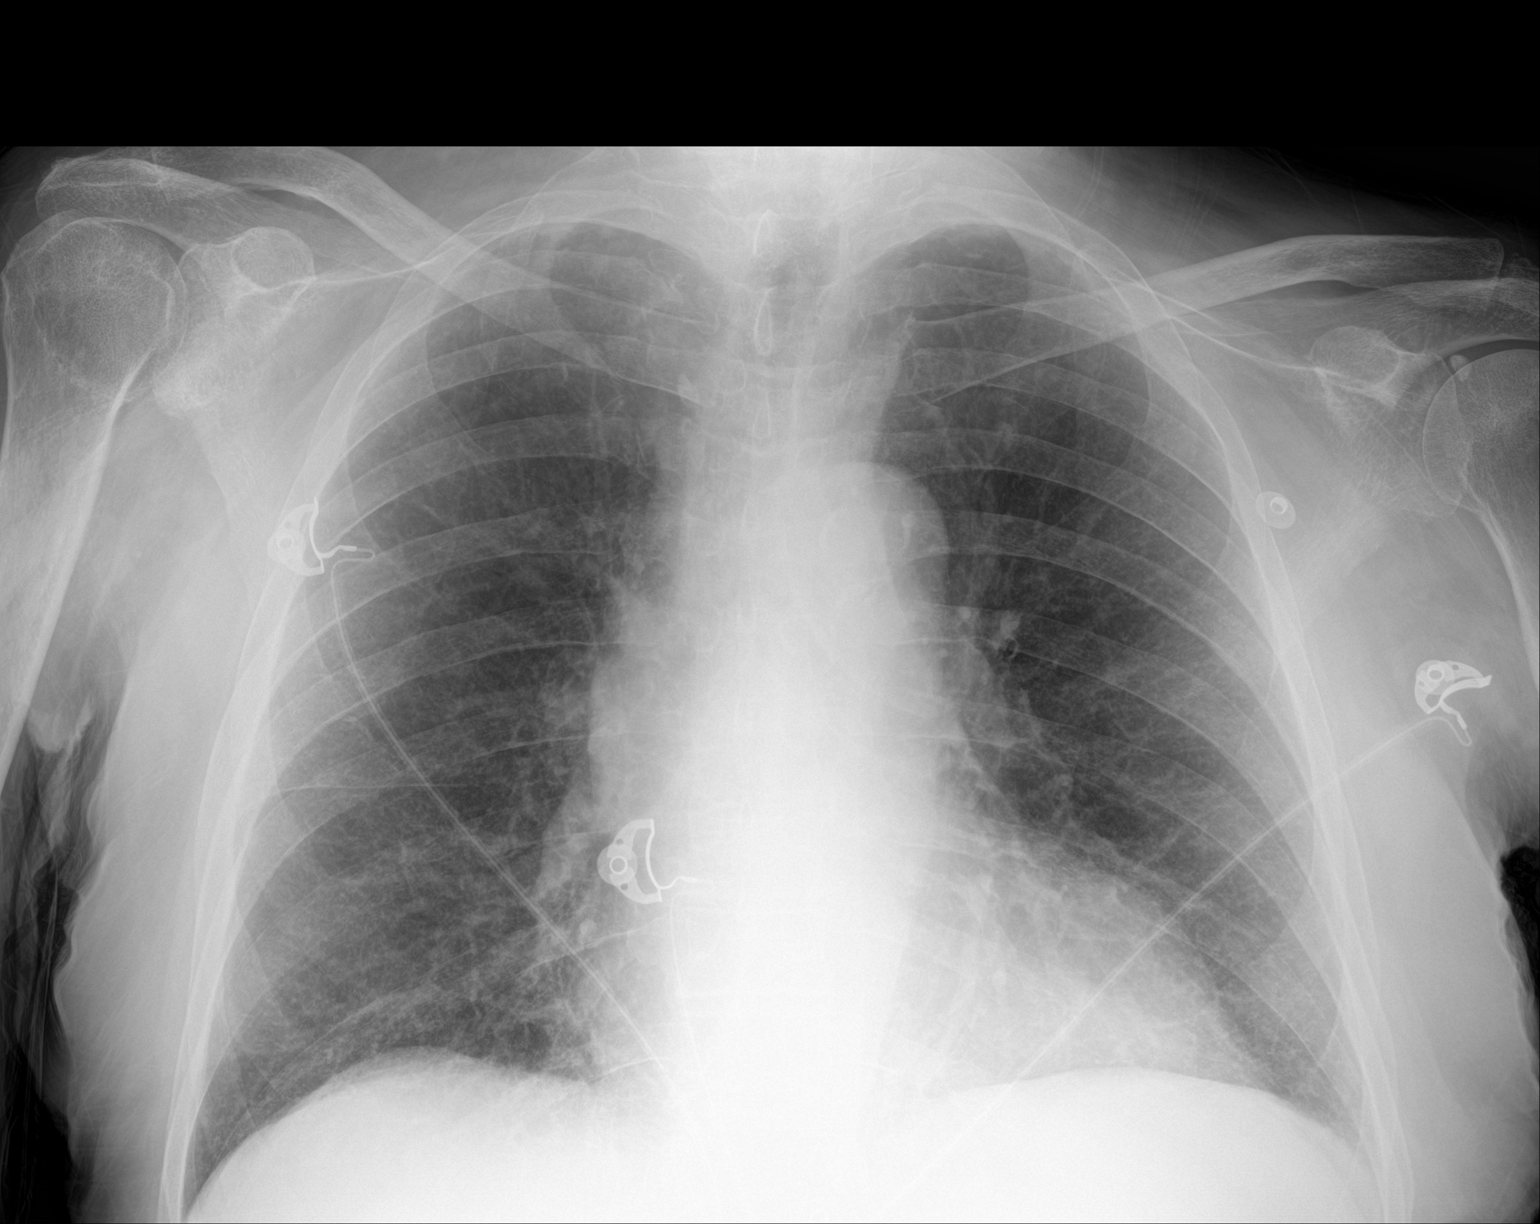

[1 of 1 positions shown; findings below may reference images not displayed]

FINDINGS: Stable cardiomediastinal silhouette. Both lungs are clear. The
visualized skeletal structures are unremarkable.
IMPRESSION: No active disease.

## 2022-01-22 IMAGING — CT CT HEAD W/O CM
4 series · 15 of 47 positions shown, 17 images · non-contrast
Comparison: [DATE]

CLINICAL DATA: Delirium



[Series 3: head without · axial · non-contrast · 0.43mm/px · z∈[-95,+25]mm · 7 of 34 slices shown, 9 images]
[im 5/34  brain]
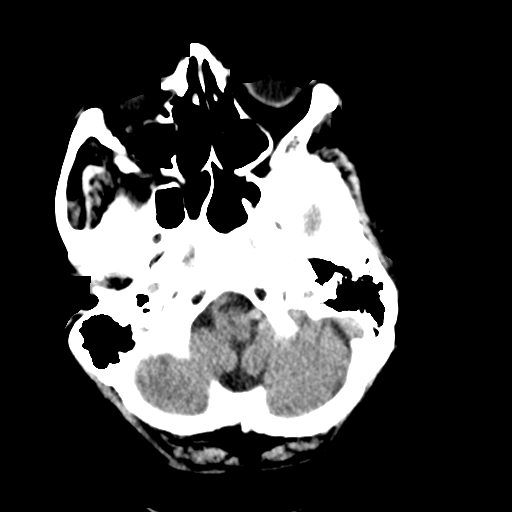
[im 5/34  bone]
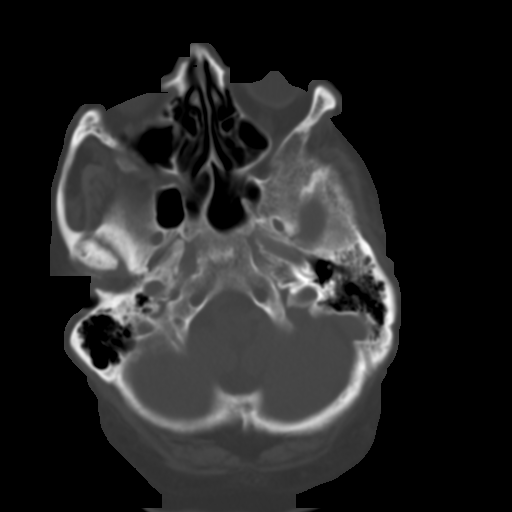
[im 9/34  brain]
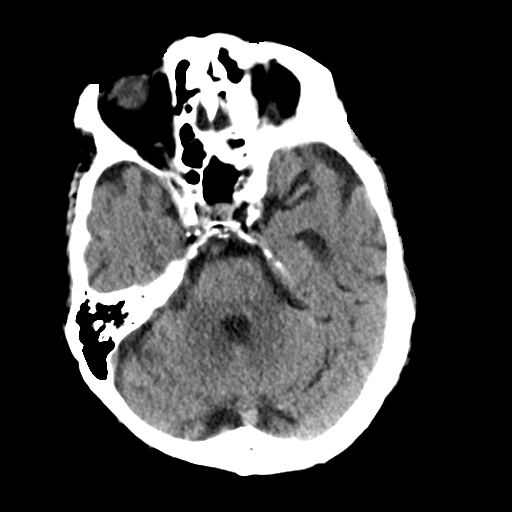
[im 13/34  brain]
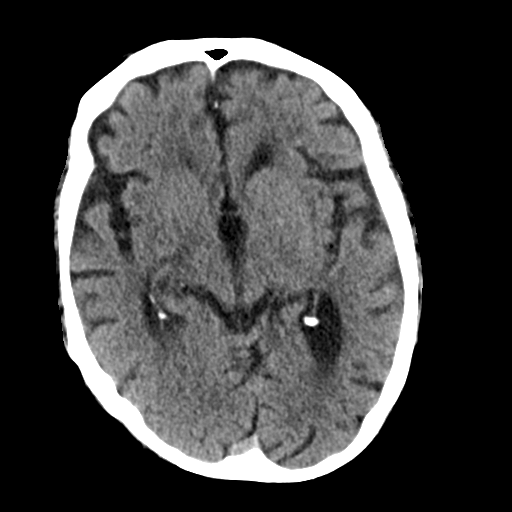
[im 17/34  brain]
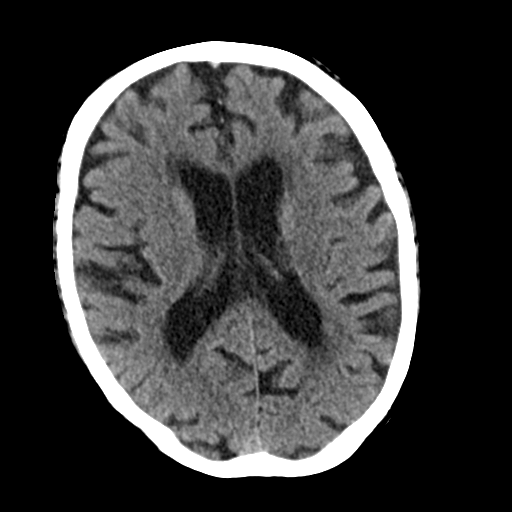
[im 21/34  brain]
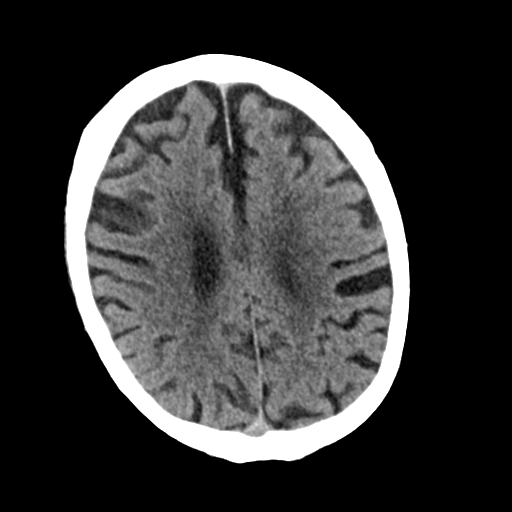
[im 21/34  bone]
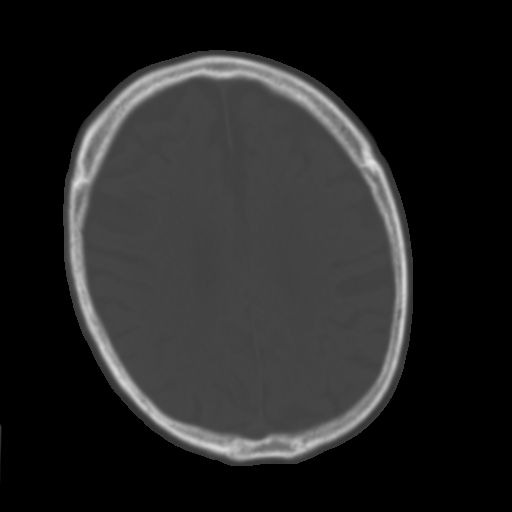
[im 25/34  brain]
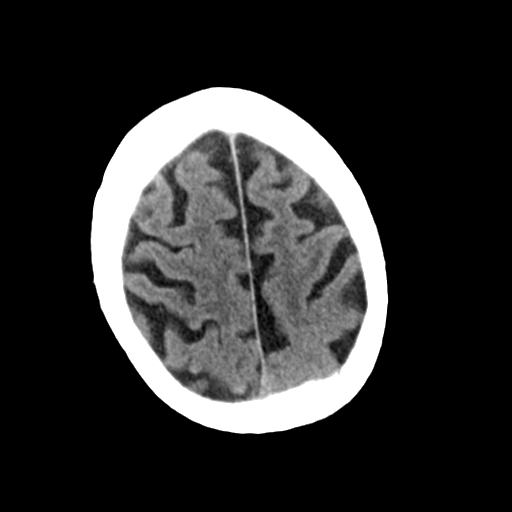
[im 29/34  brain]
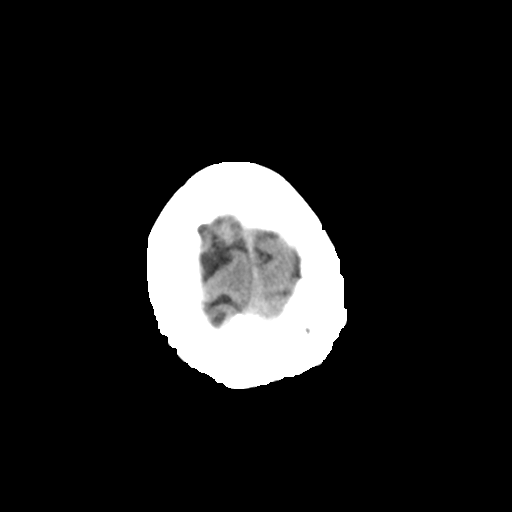

[Series 4: head bone · axial · 0.43mm/px · z∈[-99,-83]mm · 2 of 84 slices shown]
[im 9/84  bone]
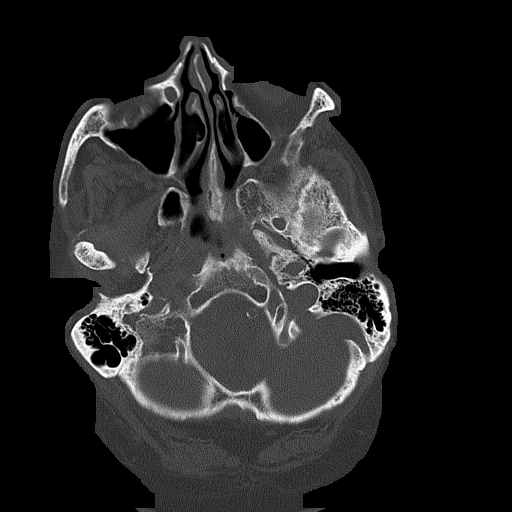
[im 17/84  bone]
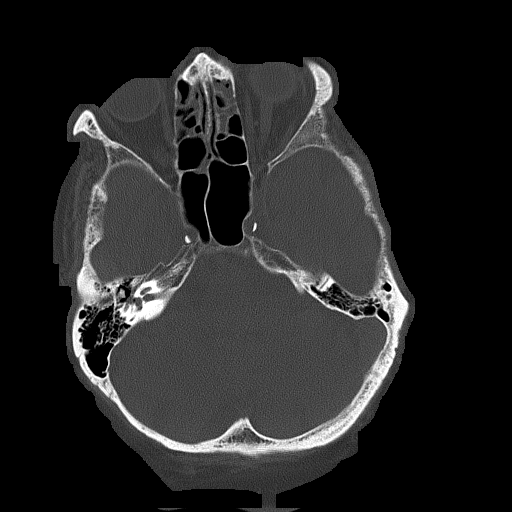

[Series 5: head without cor · coronal · non-contrast · 0.33mm/px · 3 of 68 slices shown]
[im 23/68  brain]
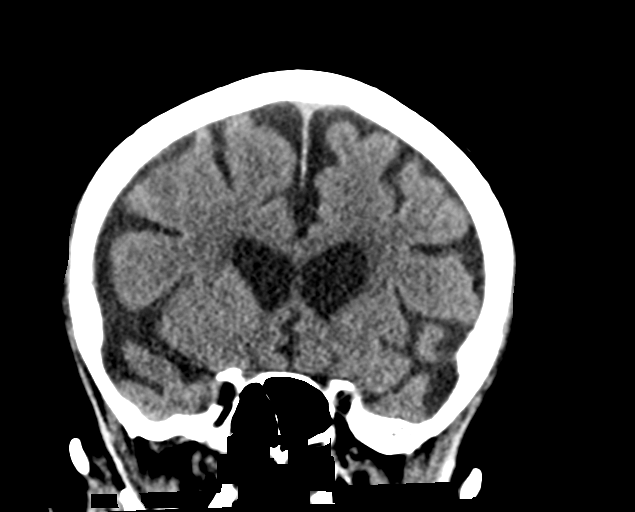
[im 30/68  brain]
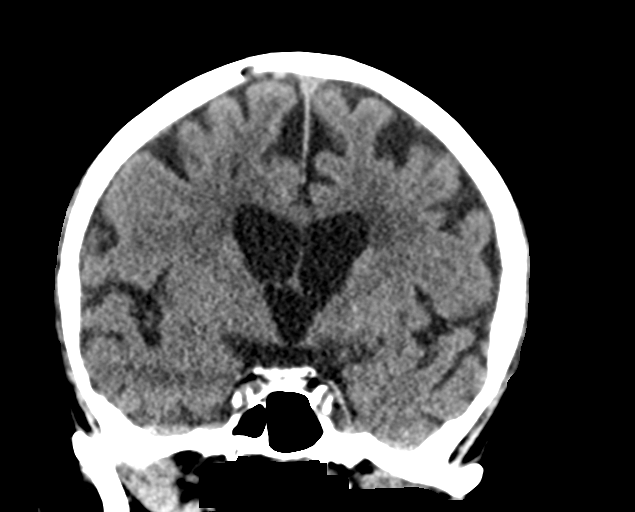
[im 38/68  brain]
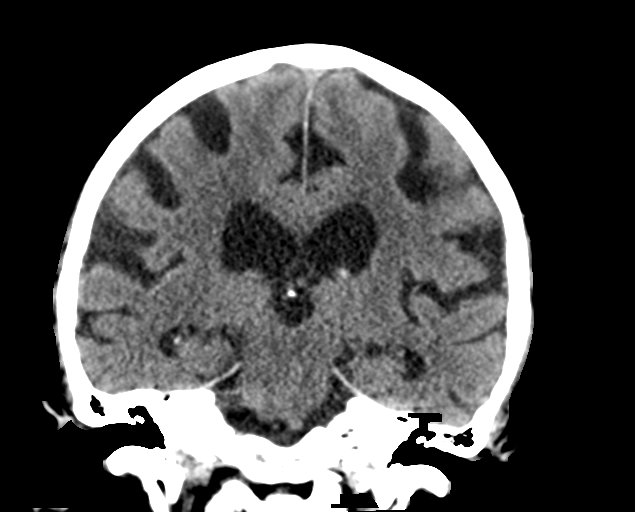

[Series 6: head without sag · sagittal · non-contrast · 0.33mm/px · 3 of 67 slices shown]
[im 23/67  brain]
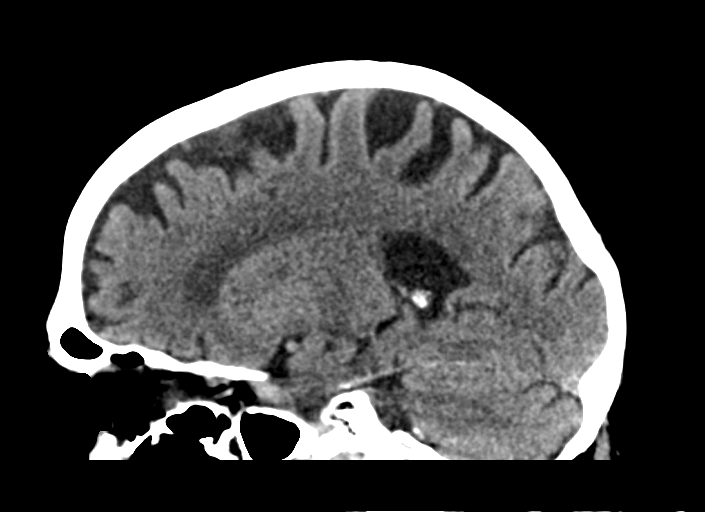
[im 34/67  brain]
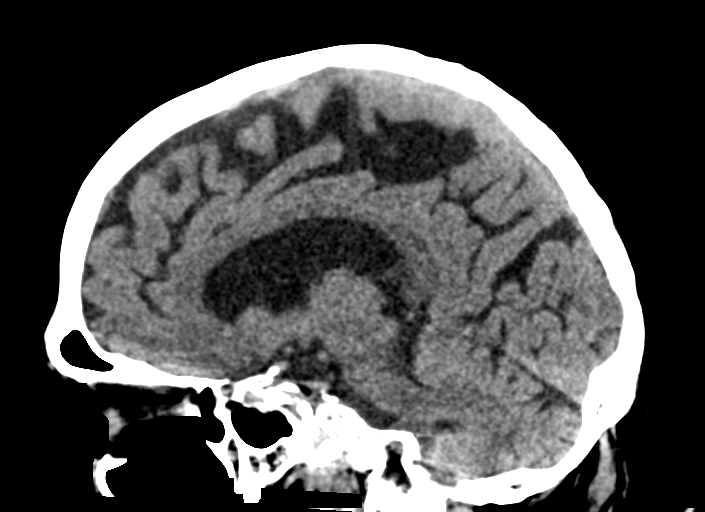
[im 45/67  brain]
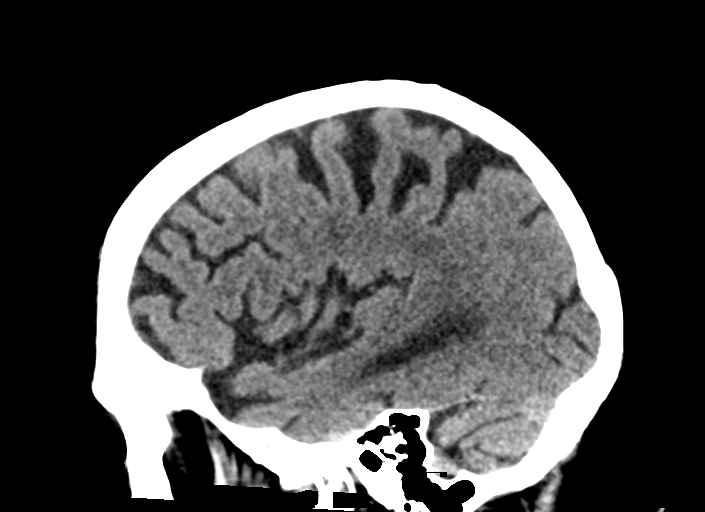

[15 of 47 positions shown; findings below may reference images not displayed]

FINDINGS: Brain: There is no mass, hemorrhage or extra-axial collection. There
is generalized atrophy without lobar predilection. Hypodensity of
the white matter is most commonly associated with chronic
microvascular disease.

Vascular: Atherosclerotic calcification of the vertebral and
internal carotid arteries at the skull base. No abnormal
hyperdensity of the major intracranial arteries or dural venous
sinuses.

Skull: The visualized skull base, calvarium and extracranial soft
tissues are normal.

Sinuses/Orbits: No fluid levels or advanced mucosal thickening of
the visualized paranasal sinuses. No mastoid or middle ear effusion.
The orbits are normal.
IMPRESSION: 1. No acute intracranial abnormality.
2. Chronic microvascular ischemia and generalized atrophy.

## 2022-01-22 MED ORDER — ACETAMINOPHEN 325 MG PO TABS
650.0000 mg | ORAL_TABLET | Freq: Four times a day (QID) | ORAL | Status: DC | PRN
  Administered 2022-01-23 – 2022-01-29 (×12): 650 mg via ORAL
  Filled 2022-01-22 (×13): qty 2

## 2022-01-22 MED ORDER — SODIUM CHLORIDE 0.9 % IV BOLUS
1000.0000 mL | Freq: Once | INTRAVENOUS | Status: AC
  Administered 2022-01-22: 1000 mL via INTRAVENOUS

## 2022-01-22 MED ORDER — SODIUM CHLORIDE 0.9 % IV BOLUS
1000.0000 mL | Freq: Once | INTRAVENOUS | Status: AC
  Administered 2022-01-23: 1000 mL via INTRAVENOUS

## 2022-01-22 MED ORDER — ATORVASTATIN CALCIUM 10 MG PO TABS
10.0000 mg | ORAL_TABLET | Freq: Every day | ORAL | Status: DC
  Administered 2022-01-23: 10 mg via ORAL
  Filled 2022-01-22: qty 1

## 2022-01-22 MED ORDER — AMLODIPINE BESYLATE 10 MG PO TABS
10.0000 mg | ORAL_TABLET | Freq: Every day | ORAL | Status: DC
  Administered 2022-01-23 – 2022-01-29 (×4): 10 mg via ORAL
  Filled 2022-01-22 (×2): qty 2
  Filled 2022-01-22 (×3): qty 1

## 2022-01-22 MED ORDER — POTASSIUM CHLORIDE 10 MEQ/100ML IV SOLN
10.0000 meq | INTRAVENOUS | Status: AC
  Administered 2022-01-22 – 2022-01-23 (×2): 10 meq via INTRAVENOUS
  Filled 2022-01-22 (×2): qty 100

## 2022-01-22 MED ORDER — SODIUM CHLORIDE 0.9% FLUSH
3.0000 mL | Freq: Two times a day (BID) | INTRAVENOUS | Status: DC
  Administered 2022-01-23 – 2022-01-29 (×11): 3 mL via INTRAVENOUS

## 2022-01-22 MED ORDER — METOPROLOL SUCCINATE ER 50 MG PO TB24
100.0000 mg | ORAL_TABLET | Freq: Every day | ORAL | Status: DC
  Administered 2022-01-23 – 2022-01-26 (×3): 100 mg via ORAL
  Filled 2022-01-22 (×2): qty 2
  Filled 2022-01-22 (×2): qty 4

## 2022-01-22 MED ORDER — PANTOPRAZOLE SODIUM 40 MG PO TBEC
40.0000 mg | DELAYED_RELEASE_TABLET | Freq: Every day | ORAL | Status: DC
  Administered 2022-01-23 – 2022-01-24 (×2): 40 mg via ORAL
  Filled 2022-01-22 (×2): qty 1

## 2022-01-22 MED ORDER — ACETAMINOPHEN 650 MG RE SUPP: 650.0000 mg | Freq: Four times a day (QID) | RECTAL | Status: DC | PRN

## 2022-01-22 MED ORDER — SODIUM CHLORIDE 0.9 % IV SOLN
0.5000 mg/kg/h | INTRAVENOUS | Status: AC
  Administered 2022-01-23: 0.5 mg/kg/h via INTRAVENOUS
  Filled 2022-01-22 (×2): qty 100

## 2022-01-22 MED ORDER — CALCIUM GLUCONATE 10 % IV SOLN
1.0000 g | Freq: Once | INTRAVENOUS | Status: AC
  Administered 2022-01-22: 1 g via INTRAVENOUS
  Filled 2022-01-22: qty 10

## 2022-01-22 MED ORDER — POTASSIUM CHLORIDE CRYS ER 20 MEQ PO TBCR
40.0000 meq | EXTENDED_RELEASE_TABLET | Freq: Once | ORAL | Status: AC
  Administered 2022-01-23: 40 meq via ORAL
  Filled 2022-01-22: qty 2

## 2022-01-22 MED ORDER — MAGNESIUM SULFATE 2 GM/50ML IV SOLN
2.0000 g | Freq: Once | INTRAVENOUS | Status: DC
  Filled 2022-01-22: qty 50

## 2022-01-22 NOTE — H&P (Signed)
History and Physical   Devin Garza WUJ:[id number] DOB: [birth year] DOA: 01/22/2022  PCP: Isaac Bliss, Rayford Halsted, MD   Patient coming from: Home  Chief Complaint: Weakness, fatigue  HPI: Devin Garza is a 81 y.o. male with medical history significant of seizures, carotid artery disease, hyperlipidemia, GI bleed, gout, hypertension, anemia, basal cell carcinoma, GERD presenting with ongoing weakness and fatigue.  Patient states for last 7 to 10 days, he has felt generally weak with increased fatigue.  Also with decreased appetite and decreased p.o. intake for the past 4-5 days.  Denies any vomiting nor abdominal pain.  Reports some loose stools but denies any diarrhea nor blood.  Does report some chronic lower extremity edema.  He denies fevers, chills, chest pain, shortness of breath.   ED Course: Vital signs in the ED significant for heart rate in the 90s to 100s, respiratory rate in the 20s, blood pressure in the 956O 130Q systolic.  Lab work-up showed CMP with potassium 2.9, bicarb 13 with gap of 20, BUN 40 with creatinine of 2.65 from a baseline of around 1, glucose 110, calcium of 5.4 which corrects to 6 considering albumin of 3.2, AST 67.  CBC with hemoglobin of 11 which is stable.  BNP borderline at 108, troponin mildly elevated at 154 with repeat pending.  FOBT positive.  Respiratory panel for flu and COVID-negative.  Urinalysis pending.  Chest x-ray showed no acute abnormality and CT head also with no acute abnormality.  Patient received 1 g calcium gluconate in the ED as well as 2 L of IV fluids and 20 mEq of IV potassium over 2 hours.  Review of Systems: As per HPI otherwise all other systems reviewed and are negative.  Past Medical History:  Diagnosis Date   Actinic keratosis    Anemia    Basal cell adenocarcinoma 09/03/2008   Qualifier: Diagnosis of  By: Lorelei Pont MD, Frederico Hamman    Overview:  Overview:  Qualifier: Diagnosis of  By: Copland MD, Spencer    Basal cell carcinoma  11/06/2008   Right mastoid/neck.    Basal cell carcinoma 11/13/2008   Left lateral nose medial infraorbital ~2cm lat. to med. canthus. Excised 01/08/2009, margins free.   Basal cell carcinoma 10/09/2009   Right upper back, paraspinal inf. to base of neck. Excised 12/03/2009   Basal cell carcinoma 06/11/2013   Left lateral forehead. Excised 07/18/2013, margins free.   Basal cell carcinoma 07/30/2015   Right nasal ala. Nodular pattern.   Basal cell carcinoma 07/30/2015   Right mid to sup. helix. Nodular.   Basal cell carcinoma 08/25/2016   Right mid to sup. helix. Superficial with focal infiltration. Excised 09/28/2016, margins free.   Basal cell carcinoma 03/16/2017   Right mid to sup. helix. Mixed pattern, ulcerated   Basal cell carcinoma 05/10/2018   Right nasal tip. BCC with focal sclerosis.   Basal cell carcinoma of lip 2007   multiple sites   Blood transfusion without reported diagnosis    ED (erectile dysfunction)    GERD (gastroesophageal reflux disease)    HLD (hyperlipidemia)    Hx of squamous cell carcinoma of skin 08/25/2016   L superior pectoral   Hypertension    Osteoarthritis    Peripheral vascular disease ([place])    Occlusion and Stenosis of Carotid Artery   Squamous cell carcinoma of skin 08/25/2016   Left superior pectoral. KA pattern. EDC.   Tobacco abuse    Tubular adenoma of colon 2005   Wears dentures  full upper and lower    Past Surgical History:  Procedure Laterality Date   ANKLE ARTHROSCOPY W/ OPEN REPAIR Left    COLONOSCOPY  2006   [place]-Tubular adenoma   COLONOSCOPY N/A 07/23/2015   Procedure: COLONOSCOPY;  Surgeon: Lucilla Lame, MD;  Location: [facility];  Service: Gastroenterology;  Laterality: N/A;  WITH BIOPSIES---- TRANSVERSE COLON POLYP DESCENDING COLON POLYP  X  4   ESOPHAGOGASTRODUODENOSCOPY N/A 07/23/2015   Procedure: ESOPHAGOGASTRODUODENOSCOPY (EGD);  Surgeon: Lucilla Lame, MD;  Location: [facility];  Service:  Gastroenterology;  Laterality: N/A;  WITH BIOPSY--- DUODENAL BIOPSY GASTRIC BIOPSY   ESOPHAGOGASTRODUODENOSCOPY N/A 10/28/2016   Procedure: ESOPHAGOGASTRODUODENOSCOPY (EGD);  Surgeon: Manya Silvas, MD;  Location: [facility] ENDOSCOPY;  Service: Endoscopy;  Laterality: N/A;   ESOPHAGOGASTRODUODENOSCOPY (EGD) WITH PROPOFOL N/A 09/02/2017   Procedure: ESOPHAGOGASTRODUODENOSCOPY (EGD) WITH PROPOFOL;  Surgeon: Manya Silvas, MD;  Location: [facility] ENDOSCOPY;  Service: Endoscopy;  Laterality: N/A;   SKIN CANCER EXCISION     LIP   TONSILLECTOMY     age 73   UPPER GI ENDOSCOPY  12/2009   Dr. Ivory Broad gastritisl negative for H. pylori; duodenal biopsies neg for sprue    Social History  reports that he quit smoking about 22 months ago. His smoking use included cigarettes. He has a 20.00 pack-year smoking history. He has never used smokeless tobacco. He reports current alcohol use of about 14.0 standard drinks per week. He reports that he does not use drugs.  No Known Allergies  Family History  Problem Relation Age of Onset   Lung cancer Mother 64   Hearing loss Father    Throat cancer Father    Heart attack Father    CAD Father    Ovarian cancer Sister   Reviewed on admission  Prior to Admission medications   Medication Sig Start Date End Date Taking? Authorizing Provider  amLODipine (NORVASC) 10 MG tablet Take 1 tablet (10 mg total) by mouth daily. 07/21/21   Isaac Bliss, Rayford Halsted, MD  atorvastatin (LIPITOR) 10 MG tablet Take 1 tablet (10 mg total) by mouth daily. 07/21/21   Isaac Bliss, Rayford Halsted, MD  buPROPion (WELLBUTRIN XL) 150 MG 24 hr tablet Take 1 tablet (150 mg total) by mouth daily. 12/10/21   Isaac Bliss, Rayford Halsted, MD  colchicine 0.6 MG tablet TAKE 1 TABLET BY MOUTH 2 TIMES DAILY. 12/10/20   Isaac Bliss, Rayford Halsted, MD  Diclofenac Sodium 1.5 % SOLN Apply 1 mL topically 4 (four) times daily as needed. 02/04/21   Mcarthur Rossetti, MD  DIOVAN HCT 320-12.5 MG  tablet Take 1 tablet by mouth daily. 12/10/21   Isaac Bliss, Rayford Halsted, MD  esomeprazole (NEXIUM 24HR) 20 MG capsule Take 2 capsules (40 mg total) by mouth 2 (two) times daily before a meal. 10/24/20   Medina-Vargas, Monina C, NP  eszopiclone (LUNESTA) 2 MG TABS tablet Take 1 tablet (2 mg total) by mouth at bedtime as needed for sleep. Take immediately before bedtime 12/10/21   Isaac Bliss, Rayford Halsted, MD  loratadine (CLARITIN) 10 MG tablet Take 1 tablet (10 mg total) by mouth daily as needed for allergies. 10/24/20   Medina-Vargas, Monina C, NP  magnesium oxide (MAG-OX) 400 MG tablet Take 1 tablet by mouth morning, noon and at bedtime 07/21/21   Isaac Bliss, Rayford Halsted, MD  metoprolol succinate (TOPROL-XL) 100 MG 24 hr tablet TAKE 1 TABLET DAILY  TAKE WITH OR IMMEDIATELY FOLLOWING A MEAL 12/10/21   Isaac Bliss, Rayford Halsted,  MD  Multiple Vitamin (MULTIVITAMIN) tablet Take 1 tablet by mouth daily.    [provider]    Physical Exam: Vitals:   01/22/22 2034 01/22/22 2053 01/22/22 2115 01/22/22 2153  BP:  (!) 142/85 127/82 116/74  Pulse:  (!) 108 (!) 105 (!) 105  Resp:  (!) 26 (!) 24 (!) 26  Temp:  97.8 F (36.6 C)    TempSrc:  Oral    SpO2:  98% 99% 96%  Weight: 74.8 kg     Height: 5\' 10"  (1.778 m)      Physical Exam Constitutional:      General: He is not in acute distress.    Appearance: Normal appearance.  HENT:     Head: Normocephalic and atraumatic.     Mouth/Throat:     Mouth: Mucous membranes are moist.     Pharynx: Oropharynx is clear.  Eyes:     Extraocular Movements: Extraocular movements intact.     Pupils: Pupils are equal, round, and reactive to light.  Cardiovascular:     Rate and Rhythm: Normal rate and regular rhythm.     Pulses: Normal pulses.     Heart sounds: Normal heart sounds.  Pulmonary:     Effort: Pulmonary effort is normal. No respiratory distress.     Breath sounds: Normal breath sounds.  Abdominal:     General: Bowel sounds are  normal. There is no distension.     Palpations: Abdomen is soft.     Tenderness: There is no abdominal tenderness.  Musculoskeletal:        General: No swelling or deformity.     Right lower leg: Edema present.     Left lower leg: Edema present.  Skin:    General: Skin is warm and dry.  Neurological:     General: No focal deficit present.     Mental Status: Mental status is at baseline.   Labs on Admission: I have personally reviewed following labs and imaging studies  CBC: Recent Labs  Lab 01/22/22 2213  WBC 8.6  NEUTROABS 5.9  HGB 11.0*  HCT 32.4*  MCV 92.3  PLT 939    Basic Metabolic Panel: Recent Labs  Lab 01/22/22 2213  NA 139  K 2.9*  CL 106  CO2 13*  GLUCOSE 110*  BUN 40*  CREATININE 2.65*  CALCIUM 5.4*    GFR: Estimated Creatinine Clearance: 22.6 mL/min (A) (by C-G formula based on SCr of 2.65 mg/dL (H)).  Liver Function Tests: Recent Labs  Lab 01/22/22 2213  AST 67*  ALT 32  ALKPHOS 51  BILITOT 0.8  PROT 6.6  ALBUMIN 3.2*    Urine analysis:    Component Value Date/Time   COLORURINE YELLOW 10/15/2020 2305   APPEARANCEUR CLEAR 10/15/2020 2305   LABSPEC 1.011 10/15/2020 2305   PHURINE 5.0 10/15/2020 2305   GLUCOSEU NEGATIVE 10/15/2020 2305   HGBUR SMALL (A) 10/15/2020 2305   BILIRUBINUR NEGATIVE 10/15/2020 2305   KETONESUR 20 (A) 10/15/2020 2305   PROTEINUR 30 (A) 10/15/2020 2305   NITRITE NEGATIVE 10/15/2020 2305   LEUKOCYTESUR NEGATIVE 10/15/2020 2305    Radiological Exams on Admission: CT Head Wo Contrast  Result Date: 01/22/2022 CLINICAL DATA:  [place]ium EXAM: CT HEAD WITHOUT CONTRAST TECHNIQUE: Contiguous axial images were obtained from the base of the skull through the vertex without intravenous contrast. RADIATION DOSE REDUCTION: This exam was performed according to the departmental dose-optimization program which includes automated exposure control, adjustment of the mA and/or kV according to patient  size and/or use of iterative  reconstruction technique. COMPARISON:  10/15/2020 FINDINGS: Brain: There is no mass, hemorrhage or extra-axial collection. There is generalized atrophy without lobar predilection. Hypodensity of the white matter is most commonly associated with chronic microvascular disease. Vascular: Atherosclerotic calcification of the vertebral and internal carotid arteries at the skull base. No abnormal hyperdensity of the major intracranial arteries or dural venous sinuses. Skull: The visualized skull base, calvarium and extracranial soft tissues are normal. Sinuses/Orbits: No fluid levels or advanced mucosal thickening of the visualized paranasal sinuses. No mastoid or middle ear effusion. The orbits are normal. IMPRESSION: 1. No acute intracranial abnormality. 2. Chronic microvascular ischemia and generalized atrophy. Electronically Signed   By: Ulyses Jarred M.D.   On: 01/22/2022 21:41   DG Chest Portable 1 View  Result Date: 01/22/2022 CLINICAL DATA:  Shortness of breath. EXAM: PORTABLE CHEST 1 VIEW COMPARISON:  October 15, 2020. FINDINGS: Stable cardiomediastinal silhouette. Both lungs are clear. The visualized skeletal structures are unremarkable. IMPRESSION: No active disease. Electronically Signed   By: Marijo Conception M.D.   On: 01/22/2022 21:21    EKG: Independently reviewed.  Sinus tachycardia at 111 beats minute.  Some baseline wander.  Some nonspecific T wave flattening.  Mild baseline artifact in multiple leads.  QTc 498.  Assessment/Plan Principal Problem:   Hypocalcemia Active Problems:   Mixed hyperlipidemia   Essential hypertension   Carotid artery stenosis   GERD without esophagitis   Hypokalemia   AKI (acute kidney injury) (HCC)   Fatigue Hypocalcemia Hypokalemia AKI > Patient presenting with generalized fatigue.  Found to have significant electrolyte derangements including calcium of 5.4 which corrects to 6 initially considering albumin 3.2.  Potassium of 2.9. > Creatinine noted be  elevated to 2.65 from baseline around 1. > Patient received initial dose of calcium gluconate 1 g and potassium of 20 mEq IV over 2 hours.  2 L of IV fluids also ordered in ED. > Unclear etiology possibly all related to AKI, will check additional electrolytes and labs. - Monitor on progressive unit for frequent labs and monitor - Repeat BMP at 1 AM and every 4 hours after that - Initiate calcium gluconate 0.5 mg/kg/h IV - Add additional 40 mEq p.o. potassium - Follow-up magnesium level ordered in ED (patient states he has been on magnesium in the past.) - Check parathyroid and vitamin D level  Elevated BNP Elevated troponin > Patient noted to have borderline BNP at 108 and troponin at 154 on first check with repeat pending. > No chest pain or shortness of breath. > Has had some tachycardia this may represent demand ischemia. > EKG without ischemic features - Follow-up repeat troponin - Monitor for any symptoms of chest pain or shortness of breath  Hyperlipidemia Carotid artery disease - Continue home atorvastatin  History of GI bleed Anemia > Hemoglobin stable at 11 > Patient did not notice any blood per rectum however FOBT was positive in ED. - Any GI bleeding is very slow at this time we will continue to trend CBC for now  Hypertension - Hold amlodipine in the setting of low normal blood pressure in the ED. - Hold valsartan in the setting of AKI and low normal blood pressure in the ED - We will continue with metoprolol  GERD - Continue PPI  DVT prophylaxis: SCDs  Code Status:   Full  Family Communication:  None on admission Disposition Plan:   Patient is from:  Home  Anticipated DC to:  Home  Anticipated DC date:  1 to 3 days  Anticipated DC barriers: None  Consults called:  None  Admission status:  Observation, progressive   Severity of Illness: The appropriate patient status for this patient is OBSERVATION. Observation status is judged to be reasonable and  necessary in order to provide the required intensity of service to ensure the patient's safety. The patient's presenting symptoms, physical exam findings, and initial radiographic and laboratory data in the context of their medical condition is felt to place them at decreased risk for further clinical deterioration. Furthermore, it is anticipated that the patient will be medically stable for discharge from the hospital within 2 midnights of admission.    Marcelyn Bruins MD [facility]  How to contact the [facility] Attending or Consulting provider [place] or covering provider during after hours [place], for this patient?   Check the care team in [facility] and look for a) attending/consulting [facility] provider listed and b) the [facility] team listed Log into [email or web address] and use [facility] universal password to access. If you do not have the password, please contact the hospital operator. Locate the [facility] provider you are looking for under [facility] and page to a number that you can be directly reached. If you still have difficulty reaching the provider, please page the [facility] (Director on Call) for the Hospitalists listed on amion for assistance.  01/22/2022, 11:46 PM

## 2022-01-22 NOTE — ED Provider Notes (Signed)
[facility] EMERGENCY DEPARTMENT Provider Note   CSN: [id number] Arrival date & time: 01/22/22  2000     History  Chief Complaint  Patient presents with   Fatigue    Devin Garza is a 81 y.o. male.  Patient c/o general weakness in the past 2-3 days. States poor appetite and poor po intake for a couple days. No vomiting. Denies abd pain. Did have a couple loose stools, no severe diarrhea. No recent blood loss, rectal bleeding or melena. No faintness or syncope. Denies headache. No chest pain or discomfort. No cough or uri symptoms. No sob.denies dysuria or gu c/o. No change in meds/new meds. Mild bilateral leg swelling - states has had previously. No leg pain, no sores or skin lesions. Denies specific known ill contacts. Denies any focal or unilateral weakness or numbness. No change in speech or vision. No problems w balance/coordination compared to baseline.   The history is provided by the patient, medical records and the EMS personnel.      Home Medications Prior to Admission medications   Medication Sig Start Date End Date Taking? Authorizing Provider  amLODipine (NORVASC) 10 MG tablet Take 1 tablet (10 mg total) by mouth daily. 07/21/21   Isaac Bliss, Rayford Halsted, MD  atorvastatin (LIPITOR) 10 MG tablet Take 1 tablet (10 mg total) by mouth daily. 07/21/21   Isaac Bliss, Rayford Halsted, MD  buPROPion (WELLBUTRIN XL) 150 MG 24 hr tablet Take 1 tablet (150 mg total) by mouth daily. 12/10/21   Isaac Bliss, Rayford Halsted, MD  colchicine 0.6 MG tablet TAKE 1 TABLET BY MOUTH 2 TIMES DAILY. 12/10/20   Isaac Bliss, Rayford Halsted, MD  Diclofenac Sodium 1.5 % SOLN Apply 1 mL topically 4 (four) times daily as needed. 02/04/21   Mcarthur Rossetti, MD  DIOVAN HCT 320-12.5 MG tablet Take 1 tablet by mouth daily. 12/10/21   Isaac Bliss, Rayford Halsted, MD  esomeprazole (NEXIUM 24HR) 20 MG capsule Take 2 capsules (40 mg total) by mouth 2 (two) times daily before a meal.  10/24/20   Medina-Vargas, Monina C, NP  eszopiclone (LUNESTA) 2 MG TABS tablet Take 1 tablet (2 mg total) by mouth at bedtime as needed for sleep. Take immediately before bedtime 12/10/21   Isaac Bliss, Rayford Halsted, MD  loratadine (CLARITIN) 10 MG tablet Take 1 tablet (10 mg total) by mouth daily as needed for allergies. 10/24/20   Medina-Vargas, Monina C, NP  magnesium oxide (MAG-OX) 400 MG tablet Take 1 tablet by mouth morning, noon and at bedtime 07/21/21   Isaac Bliss, Rayford Halsted, MD  metoprolol succinate (TOPROL-XL) 100 MG 24 hr tablet TAKE 1 TABLET DAILY  TAKE WITH OR IMMEDIATELY FOLLOWING A MEAL 12/10/21   Isaac Bliss, Rayford Halsted, MD  Multiple Vitamin (MULTIVITAMIN) tablet Take 1 tablet by mouth daily.    [provider]      Allergies    Patient has no known allergies.    Review of Systems   Review of Systems  Constitutional:  Negative for chills, diaphoresis and fever.  HENT:  Negative for sore throat and trouble swallowing.   Eyes:  Negative for pain, redness and visual disturbance.  Respiratory:  Negative for cough and shortness of breath.   Cardiovascular:  Positive for leg swelling. Negative for chest pain.  Gastrointestinal:  Positive for diarrhea. Negative for abdominal pain, blood in stool and vomiting.  Genitourinary:  Negative for dysuria and flank pain.  Musculoskeletal:  Negative for back pain and neck  pain.  Skin:  Negative for rash.  Neurological:  Negative for headaches.  Hematological:  Does not bruise/bleed easily.  Psychiatric/Behavioral:  Negative for agitation.    Physical Exam Updated Vital Signs BP (!) 142/85 (BP Location: Right Arm)    Pulse (!) 108    Temp 97.8 F (36.6 C) (Oral)    Resp (!) 26    Ht 1.778 m (5\' 10" )    Wt 74.8 kg    SpO2 98%    BMI 23.68 kg/m  Physical Exam Vitals and nursing note reviewed.  Constitutional:      Appearance: Normal appearance. He is well-developed.  HENT:     Head: Atraumatic.     Nose: Nose normal.      Mouth/Throat:     Mouth: Mucous membranes are dry.     Pharynx: Oropharynx is clear. No oropharyngeal exudate or posterior oropharyngeal erythema.  Eyes:     General: No scleral icterus.    Conjunctiva/sclera: Conjunctivae normal.     Pupils: Pupils are equal, round, and reactive to light.  Neck:     Trachea: No tracheal deviation.     Comments: No stiffness or rigidity.  Cardiovascular:     Rate and Rhythm: Regular rhythm. Tachycardia present.     Pulses: Normal pulses.     Heart sounds: Normal heart sounds. No murmur heard.   No friction rub. No gallop.  Pulmonary:     Effort: Pulmonary effort is normal. No accessory muscle usage or respiratory distress.     Breath sounds: Normal breath sounds.  Abdominal:     General: Bowel sounds are normal. There is no distension.     Palpations: Abdomen is soft. There is no mass.     Tenderness: There is no abdominal tenderness. There is no guarding.  Genitourinary:    Comments: No cva tenderness. Liquid black stool, heme positive.  Musculoskeletal:        General: No tenderness.     Cervical back: Normal range of motion and neck supple. No rigidity.     Comments: Symmetric, bilateral ankle/lower leg swelling.   Skin:    General: Skin is warm and dry.     Findings: No rash.     Comments: No skin lesions, sores, or cellulitis noted.   Neurological:     Mental Status: He is alert.     Comments: Alert, speech clear. Motor/sens grossly intact bil.   Psychiatric:        Mood and Affect: Mood normal.    ED Results / Procedures / Treatments   Labs (all labs ordered are listed, but only abnormal results are displayed) Results for orders placed or performed during the hospital encounter of 01/22/22  CBC with Differential  Result Value Ref Range   WBC 8.6 4.0 - 10.5 K/uL   RBC 3.51 (L) 4.22 - 5.81 MIL/uL   Hemoglobin 11.0 (L) 13.0 - 17.0 g/dL   HCT 32.4 (L) 39.0 - 52.0 %   MCV 92.3 80.0 - 100.0 fL   MCH 31.3 26.0 - 34.0 pg   MCHC  34.0 30.0 - 36.0 g/dL   RDW 12.5 11.5 - 15.5 %   Platelets 243 150 - 400 K/uL   nRBC 0.0 0.0 - 0.2 %   Neutrophils Relative % 70 %   Neutro Abs 5.9 1.7 - 7.7 K/uL   Lymphocytes Relative 13 %   Lymphs Abs 1.1 0.7 - 4.0 K/uL   Monocytes Relative 17 %   Monocytes Absolute 1.5 (H)  0.1 - 1.0 K/uL   Eosinophils Relative 0 %   Eosinophils Absolute 0.0 0.0 - 0.5 K/uL   Basophils Relative 0 %   Basophils Absolute 0.0 0.0 - 0.1 K/uL   Immature Granulocytes 0 %   Abs Immature Granulocytes 0.02 0.00 - 0.07 K/uL  Comprehensive metabolic panel  Result Value Ref Range   Sodium 139 135 - 145 mmol/L   Potassium 2.9 (L) 3.5 - 5.1 mmol/L   Chloride 106 98 - 111 mmol/L   CO2 13 (L) 22 - 32 mmol/L   Glucose, Bld 110 (H) 70 - 99 mg/dL   BUN 40 (H) 8 - 23 mg/dL   Creatinine, Ser 2.65 (H) 0.61 - 1.24 mg/dL   Calcium 5.4 (LL) 8.9 - 10.3 mg/dL   Total Protein 6.6 6.5 - 8.1 g/dL   Albumin 3.2 (L) 3.5 - 5.0 g/dL   AST 67 (H) 15 - 41 U/L   ALT 32 0 - 44 U/L   Alkaline Phosphatase 51 38 - 126 U/L   Total Bilirubin 0.8 0.3 - 1.2 mg/dL   GFR, Estimated 23 (L) >60 mL/min   Anion gap 20 (H) 5 - 15  Brain natriuretic peptide  Result Value Ref Range   B Natriuretic Peptide 108.5 (H) 0.0 - 100.0 pg/mL  POC occult blood, ED Provider will collect  Result Value Ref Range   Fecal Occult Bld POSITIVE (A) NEGATIVE  Troponin I (High Sensitivity)  Result Value Ref Range   Troponin I (High Sensitivity) 154 (HH) <18 ng/L   CT Head Wo Contrast  Result Date: 01/22/2022 CLINICAL DATA:  Delirium EXAM: CT HEAD WITHOUT CONTRAST TECHNIQUE: Contiguous axial images were obtained from the base of the skull through the vertex without intravenous contrast. RADIATION DOSE REDUCTION: This exam was performed according to the departmental dose-optimization program which includes automated exposure control, adjustment of the mA and/or kV according to patient size and/or use of iterative reconstruction technique. COMPARISON:   10/15/2020 FINDINGS: Brain: There is no mass, hemorrhage or extra-axial collection. There is generalized atrophy without lobar predilection. Hypodensity of the white matter is most commonly associated with chronic microvascular disease. Vascular: Atherosclerotic calcification of the vertebral and internal carotid arteries at the skull base. No abnormal hyperdensity of the major intracranial arteries or dural venous sinuses. Skull: The visualized skull base, calvarium and extracranial soft tissues are normal. Sinuses/Orbits: No fluid levels or advanced mucosal thickening of the visualized paranasal sinuses. No mastoid or middle ear effusion. The orbits are normal. IMPRESSION: 1. No acute intracranial abnormality. 2. Chronic microvascular ischemia and generalized atrophy. Electronically Signed   By: Ulyses Jarred M.D.   On: 01/22/2022 21:41   DG Chest Portable 1 View  Result Date: 01/22/2022 CLINICAL DATA:  Shortness of breath. EXAM: PORTABLE CHEST 1 VIEW COMPARISON:  October 15, 2020. FINDINGS: Stable cardiomediastinal silhouette. Both lungs are clear. The visualized skeletal structures are unremarkable. IMPRESSION: No active disease. Electronically Signed   By: Marijo Conception M.D.   On: 01/22/2022 21:21     EKG EKG Interpretation  Date/Time:  Friday January 22 2022 22:01:46 EST Ventricular Rate:  111 PR Interval:    QRS Duration: 93 QT Interval:  366 QTC Calculation: 498 R Axis:   6 Text Interpretation: Sinus tachycardia Non-specific ST-t changes Confirmed by Lajean Saver [phone]) on 01/22/2022 11:07:21 PM  Radiology CT Head Wo Contrast  Result Date: 01/22/2022 CLINICAL DATA:  Delirium EXAM: CT HEAD WITHOUT CONTRAST TECHNIQUE: Contiguous axial images were obtained from the base of  the skull through the vertex without intravenous contrast. RADIATION DOSE REDUCTION: This exam was performed according to the departmental dose-optimization program which includes automated exposure control,  adjustment of the mA and/or kV according to patient size and/or use of iterative reconstruction technique. COMPARISON:  10/15/2020 FINDINGS: Brain: There is no mass, hemorrhage or extra-axial collection. There is generalized atrophy without lobar predilection. Hypodensity of the white matter is most commonly associated with chronic microvascular disease. Vascular: Atherosclerotic calcification of the vertebral and internal carotid arteries at the skull base. No abnormal hyperdensity of the major intracranial arteries or dural venous sinuses. Skull: The visualized skull base, calvarium and extracranial soft tissues are normal. Sinuses/Orbits: No fluid levels or advanced mucosal thickening of the visualized paranasal sinuses. No mastoid or middle ear effusion. The orbits are normal. IMPRESSION: 1. No acute intracranial abnormality. 2. Chronic microvascular ischemia and generalized atrophy. Electronically Signed   By: Ulyses Jarred M.D.   On: 01/22/2022 21:41   DG Chest Portable 1 View  Result Date: 01/22/2022 CLINICAL DATA:  Shortness of breath. EXAM: PORTABLE CHEST 1 VIEW COMPARISON:  October 15, 2020. FINDINGS: Stable cardiomediastinal silhouette. Both lungs are clear. The visualized skeletal structures are unremarkable. IMPRESSION: No active disease. Electronically Signed   By: Marijo Conception M.D.   On: 01/22/2022 21:21    Procedures Procedures    Medications Ordered in ED Medications - No data to display  ED Course/ Medical Decision Making/ A&P                           Medical Decision Making Problems Addressed: AKI (acute kidney injury) [facility]): acute illness or injury with systemic symptoms that poses a threat to life or bodily functions Dehydration: acute illness or injury with systemic symptoms that poses a threat to life or bodily functions Diarrhea, unspecified type: acute illness or injury Elevated troponin: acute illness or injury that poses a threat to life or bodily  functions Generalized weakness: acute illness or injury with systemic symptoms Heme positive stool: acute illness or injury that poses a threat to life or bodily functions Hypocalcemia: acute illness or injury that poses a threat to life or bodily functions Hypokalemia: acute illness or injury Poor appetite: acute illness or injury with systemic symptoms  Amount and/or Complexity of Data Reviewed Independent Historian: EMS    Details: additional hx from ems. External Data Reviewed: labs, radiology, ECG and notes. Labs: ordered. Decision-making details documented in ED Course. Radiology: ordered and independent interpretation performed. Decision-making details documented in ED Course. ECG/medicine tests: ordered and independent interpretation performed. Decision-making details documented in ED Course. Discussion of management or test interpretation with external provider(s): Discussed with medicine doc, incl labs, hx - will admit.   Risk Prescription drug management. Decision regarding hospitalization.   Iv ns. Continuous pulse ox and cardiac monitoring. Stat labs and imaging.   Reviewed nursing notes and prior charts for additional history. External results reviewed, additional hx from ems.   Labs reviewed/interpreted by me - mg low, hx same. Mg iv. Ecg.   Cardiac monitor-  sinus tach, rate 104.   Ns bolus.   CXR reviewed/interpreted by me - no pna.   CT reviewed/interpreted by me - no hem.   Additional labs reviewed/interpreted by me - ca low, aki, hc03 low c/w dehydration, k low. Kcl iv. Caglu iv. Mg pending.   UA pending.   Stool heme positive.   Pt remains alert, denies pain. No headache, no  chest pain, no abd pain. Abd soft non tender.   Medicine consulted for admission.   CRITICAL CARE RE: severe dehydration with AKI, hypokalemia, sev hypocalcemia, weakness, heme pos stools.  Performed by: Mirna Mires Total critical care time: 45 minutes Critical care time was  exclusive of separately billable procedures and treating other patients. Critical care was necessary to treat or prevent imminent or life-threatening deterioration. Critical care was time spent personally by me on the following activities: development of treatment plan with patient and/or surrogate as well as nursing, discussions with consultants, evaluation of patient's response to treatment, examination of patient, obtaining history from patient or surrogate, ordering and performing treatments and interventions, ordering and review of laboratory studies, ordering and review of radiographic studies, pulse oximetry and re-evaluation of patient's condition.           Final Clinical Impression(s) / ED Diagnoses Final diagnoses:  None    Rx / DC Orders ED Discharge Orders     None         Lajean Saver, MD 01/22/22 2313

## 2022-01-22 NOTE — ED Notes (Signed)
Steinl MD notified of critical calcium 5.4 and troponin 154

## 2022-01-22 NOTE — ED Triage Notes (Signed)
Pt bib [facility] from [place] at [facility] facility after reporting fatigue, SOB, diarrhea, and poor oral intake for two days. No feve, chills, or cough reported. Slight tremor at baseline, more prominent the last two days. HR 120s, BP 156/72, CBG 223, O2 100%. 500 NS bolus given PTA.

## 2022-01-22 NOTE — ED Notes (Signed)
Unable to obtain labs 

## 2022-01-22 NOTE — ED Notes (Signed)
Pt successfully drank cup of water

## 2022-01-23 ENCOUNTER — Observation Stay (HOSPITAL_COMMUNITY): Payer: Medicare Other

## 2022-01-23 DIAGNOSIS — I2510 Atherosclerotic heart disease of native coronary artery without angina pectoris: Secondary | ICD-10-CM | POA: Diagnosis present

## 2022-01-23 DIAGNOSIS — K219 Gastro-esophageal reflux disease without esophagitis: Secondary | ICD-10-CM | POA: Diagnosis present

## 2022-01-23 DIAGNOSIS — J690 Pneumonitis due to inhalation of food and vomit: Secondary | ICD-10-CM | POA: Diagnosis not present

## 2022-01-23 DIAGNOSIS — R1310 Dysphagia, unspecified: Secondary | ICD-10-CM | POA: Diagnosis present

## 2022-01-23 DIAGNOSIS — R531 Weakness: Secondary | ICD-10-CM

## 2022-01-23 DIAGNOSIS — R001 Bradycardia, unspecified: Secondary | ICD-10-CM | POA: Diagnosis present

## 2022-01-23 DIAGNOSIS — Z85828 Personal history of other malignant neoplasm of skin: Secondary | ICD-10-CM | POA: Diagnosis not present

## 2022-01-23 DIAGNOSIS — M7989 Other specified soft tissue disorders: Secondary | ICD-10-CM | POA: Diagnosis not present

## 2022-01-23 DIAGNOSIS — G9340 Encephalopathy, unspecified: Secondary | ICD-10-CM | POA: Diagnosis not present

## 2022-01-23 DIAGNOSIS — R0602 Shortness of breath: Secondary | ICD-10-CM | POA: Diagnosis not present

## 2022-01-23 DIAGNOSIS — M25571 Pain in right ankle and joints of right foot: Secondary | ICD-10-CM | POA: Diagnosis not present

## 2022-01-23 DIAGNOSIS — R339 Retention of urine, unspecified: Secondary | ICD-10-CM | POA: Diagnosis not present

## 2022-01-23 DIAGNOSIS — F10931 Alcohol use, unspecified with withdrawal delirium: Secondary | ICD-10-CM | POA: Diagnosis not present

## 2022-01-23 DIAGNOSIS — I739 Peripheral vascular disease, unspecified: Secondary | ICD-10-CM | POA: Diagnosis present

## 2022-01-23 DIAGNOSIS — E782 Mixed hyperlipidemia: Secondary | ICD-10-CM | POA: Diagnosis present

## 2022-01-23 DIAGNOSIS — J9601 Acute respiratory failure with hypoxia: Secondary | ICD-10-CM | POA: Diagnosis not present

## 2022-01-23 DIAGNOSIS — F10939 Alcohol use, unspecified with withdrawal, unspecified: Secondary | ICD-10-CM | POA: Diagnosis not present

## 2022-01-23 DIAGNOSIS — E876 Hypokalemia: Secondary | ICD-10-CM | POA: Diagnosis not present

## 2022-01-23 DIAGNOSIS — R0902 Hypoxemia: Secondary | ICD-10-CM | POA: Diagnosis not present

## 2022-01-23 DIAGNOSIS — R197 Diarrhea, unspecified: Secondary | ICD-10-CM

## 2022-01-23 DIAGNOSIS — S022XXA Fracture of nasal bones, initial encounter for closed fracture: Secondary | ICD-10-CM | POA: Diagnosis not present

## 2022-01-23 DIAGNOSIS — R410 Disorientation, unspecified: Secondary | ICD-10-CM | POA: Diagnosis not present

## 2022-01-23 DIAGNOSIS — M6282 Rhabdomyolysis: Secondary | ICD-10-CM | POA: Diagnosis present

## 2022-01-23 DIAGNOSIS — R569 Unspecified convulsions: Secondary | ICD-10-CM | POA: Diagnosis not present

## 2022-01-23 DIAGNOSIS — E860 Dehydration: Secondary | ICD-10-CM | POA: Diagnosis not present

## 2022-01-23 DIAGNOSIS — I10 Essential (primary) hypertension: Secondary | ICD-10-CM | POA: Diagnosis not present

## 2022-01-23 DIAGNOSIS — F10231 Alcohol dependence with withdrawal delirium: Secondary | ICD-10-CM | POA: Diagnosis present

## 2022-01-23 DIAGNOSIS — R0609 Other forms of dyspnea: Secondary | ICD-10-CM | POA: Diagnosis not present

## 2022-01-23 DIAGNOSIS — E440 Moderate protein-calorie malnutrition: Secondary | ICD-10-CM | POA: Diagnosis not present

## 2022-01-23 DIAGNOSIS — I6529 Occlusion and stenosis of unspecified carotid artery: Secondary | ICD-10-CM | POA: Diagnosis present

## 2022-01-23 DIAGNOSIS — Z20822 Contact with and (suspected) exposure to covid-19: Secondary | ICD-10-CM | POA: Diagnosis present

## 2022-01-23 DIAGNOSIS — N179 Acute kidney failure, unspecified: Secondary | ICD-10-CM | POA: Diagnosis not present

## 2022-01-23 DIAGNOSIS — K921 Melena: Secondary | ICD-10-CM | POA: Diagnosis present

## 2022-01-23 DIAGNOSIS — D649 Anemia, unspecified: Secondary | ICD-10-CM | POA: Diagnosis present

## 2022-01-23 LAB — PHOSPHORUS
Phosphorus: 5.8 mg/dL — ABNORMAL HIGH (ref 2.5–4.6)
Phosphorus: 3.5 mg/dL (ref 2.5–4.6)
Phosphorus: 2.8 mg/dL (ref 2.5–4.6)

## 2022-01-23 LAB — BASIC METABOLIC PANEL
Anion gap: 20 — ABNORMAL HIGH (ref 5–15)
Anion gap: 19 — ABNORMAL HIGH (ref 5–15)
Anion gap: 17 — ABNORMAL HIGH (ref 5–15)
Anion gap: 15 (ref 5–15)
BUN: 40 mg/dL — ABNORMAL HIGH (ref 8–23)
BUN: 38 mg/dL — ABNORMAL HIGH (ref 8–23)
BUN: 41 mg/dL — ABNORMAL HIGH (ref 8–23)
BUN: 35 mg/dL — ABNORMAL HIGH (ref 8–23)
CO2: 13 mmol/L — ABNORMAL LOW (ref 22–32)
CO2: 11 mmol/L — ABNORMAL LOW (ref 22–32)
CO2: 11 mmol/L — ABNORMAL LOW (ref 22–32)
CO2: 13 mmol/L — ABNORMAL LOW (ref 22–32)
Calcium: 6.1 mg/dL — CL (ref 8.9–10.3)
Calcium: 5.9 mg/dL — CL (ref 8.9–10.3)
Calcium: 6.2 mg/dL — CL (ref 8.9–10.3)
Calcium: 7 mg/dL — ABNORMAL LOW (ref 8.9–10.3)
Chloride: 106 mmol/L (ref 98–111)
Chloride: 109 mmol/L (ref 98–111)
Chloride: 109 mmol/L (ref 98–111)
Chloride: 109 mmol/L (ref 98–111)
Creatinine, Ser: 2.48 mg/dL — ABNORMAL HIGH (ref 0.61–1.24)
Creatinine, Ser: 2.06 mg/dL — ABNORMAL HIGH (ref 0.61–1.24)
Creatinine, Ser: 1.75 mg/dL — ABNORMAL HIGH (ref 0.61–1.24)
Creatinine, Ser: 1.44 mg/dL — ABNORMAL HIGH (ref 0.61–1.24)
GFR, Estimated: 25 mL/min — ABNORMAL LOW (ref >60)
GFR, Estimated: 32 mL/min — ABNORMAL LOW (ref >60)
GFR, Estimated: 39 mL/min — ABNORMAL LOW (ref >60)
GFR, Estimated: 49 mL/min — ABNORMAL LOW (ref >60)
Glucose, Bld: 108 mg/dL — ABNORMAL HIGH (ref 70–99)
Glucose, Bld: 121 mg/dL — ABNORMAL HIGH (ref 70–99)
Glucose, Bld: 117 mg/dL — ABNORMAL HIGH (ref 70–99)
Glucose, Bld: 158 mg/dL — ABNORMAL HIGH (ref 70–99)
Potassium: 2.8 mmol/L — ABNORMAL LOW (ref 3.5–5.1)
Potassium: 3.3 mmol/L — ABNORMAL LOW (ref 3.5–5.1)
Potassium: 3.8 mmol/L (ref 3.5–5.1)
Potassium: 3.6 mmol/L (ref 3.5–5.1)
Sodium: 139 mmol/L (ref 135–145)
Sodium: 139 mmol/L (ref 135–145)
Sodium: 137 mmol/L (ref 135–145)
Sodium: 137 mmol/L (ref 135–145)

## 2022-01-23 LAB — GASTROINTESTINAL PANEL BY PCR, STOOL (REPLACES STOOL CULTURE)

## 2022-01-23 LAB — CBC
HCT: 30.4 % — ABNORMAL LOW (ref 39.0–52.0)
HCT: 31 % — ABNORMAL LOW (ref 39.0–52.0)
Hemoglobin: 10.4 g/dL — ABNORMAL LOW (ref 13.0–17.0)
Hemoglobin: 10.9 g/dL — ABNORMAL LOW (ref 13.0–17.0)
MCH: 31.8 pg (ref 26.0–34.0)
MCH: 32.2 pg (ref 26.0–34.0)
MCHC: 34.2 g/dL (ref 30.0–36.0)
MCHC: 35.2 g/dL (ref 30.0–36.0)
MCV: 93 fL (ref 80.0–100.0)
MCV: 91.4 fL (ref 80.0–100.0)
Platelets: 213 10*3/uL (ref 150–400)
Platelets: 216 10*3/uL (ref 150–400)
RBC: 3.27 MIL/uL — ABNORMAL LOW (ref 4.22–5.81)
RBC: 3.39 MIL/uL — ABNORMAL LOW (ref 4.22–5.81)
RDW: 12.4 % (ref 11.5–15.5)
RDW: 12.4 % (ref 11.5–15.5)
WBC: 7.9 10*3/uL (ref 4.0–10.5)
WBC: 8.5 10*3/uL (ref 4.0–10.5)
nRBC: 0 % (ref 0.0–0.2)
nRBC: 0 % (ref 0.0–0.2)

## 2022-01-23 LAB — TROPONIN I (HIGH SENSITIVITY): Troponin I (High Sensitivity): 154 ng/L (ref <18)

## 2022-01-23 LAB — MAGNESIUM
Magnesium: <0.5 mg/dL — CL (ref 1.7–2.4)
Magnesium: 1 mg/dL — ABNORMAL LOW (ref 1.7–2.4)
Magnesium: 1.9 mg/dL (ref 1.7–2.4)

## 2022-01-23 LAB — C DIFFICILE QUICK SCREEN W PCR REFLEX
C Diff antigen: NEGATIVE
C Diff interpretation: NOT DETECTED
C Diff toxin: NEGATIVE

## 2022-01-23 LAB — CK: Total CK: 2944 U/L — ABNORMAL HIGH (ref 49–397)

## 2022-01-23 LAB — VITAMIN D 25 HYDROXY (VIT D DEFICIENCY, FRACTURES): Vit D, 25-Hydroxy: 38.7 ng/mL (ref 30–100)

## 2022-01-23 IMAGING — DX DG ANKLE PORT 2V*R*
1 series · 2 of 2 positions shown · non-contrast
Comparison: Right foot radiographs [DATE]

CLINICAL DATA: Right ankle pain

EXAM:
PORTABLE RIGHT ANKLE - 2 VIEW

[Series 1: ankle · 0.14mm/px · 2 of 2 slices shown]
[im 1/2]
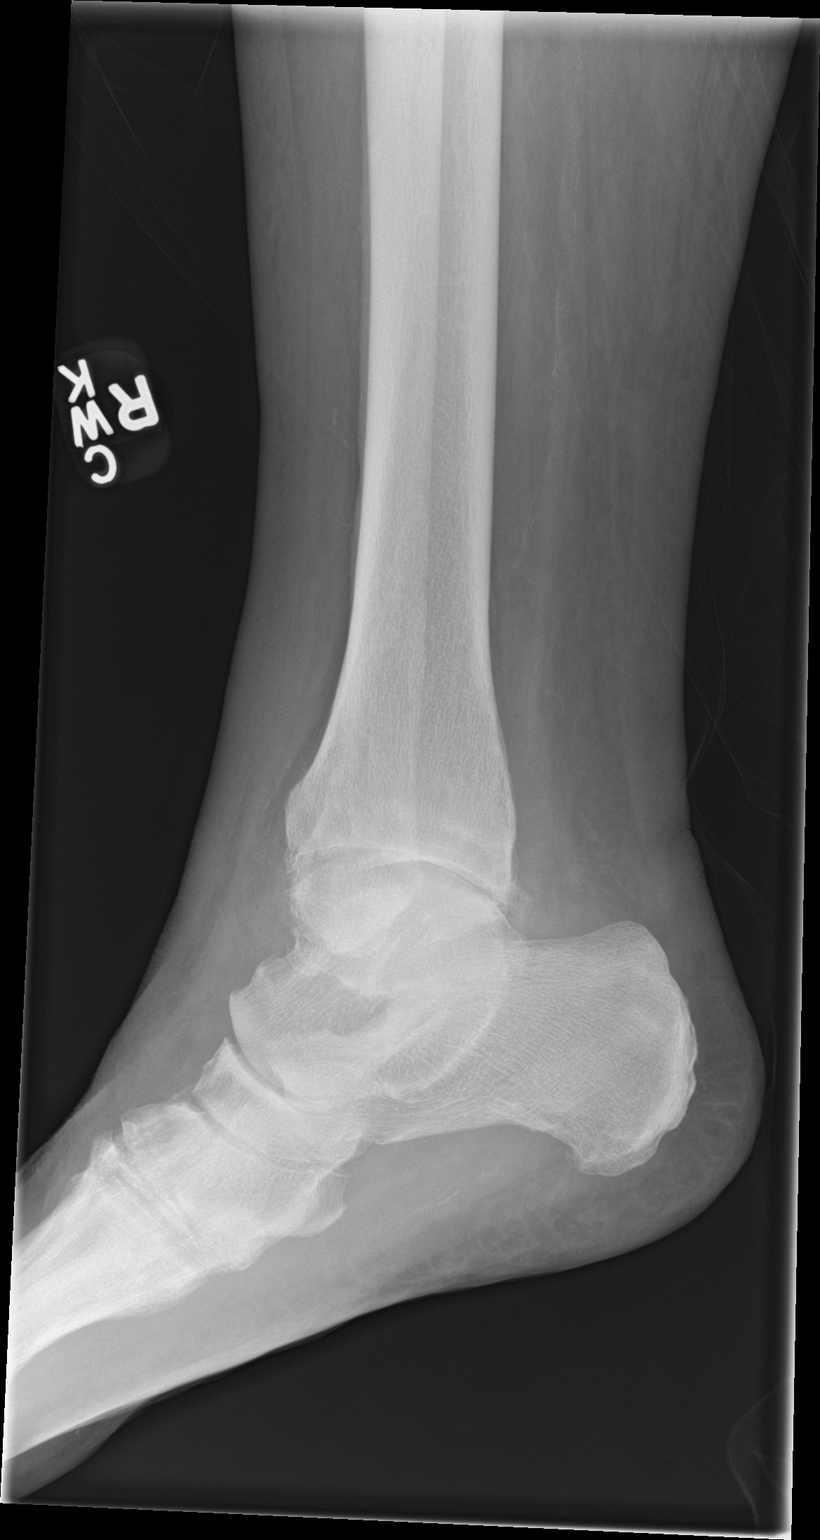
[im 2/2]
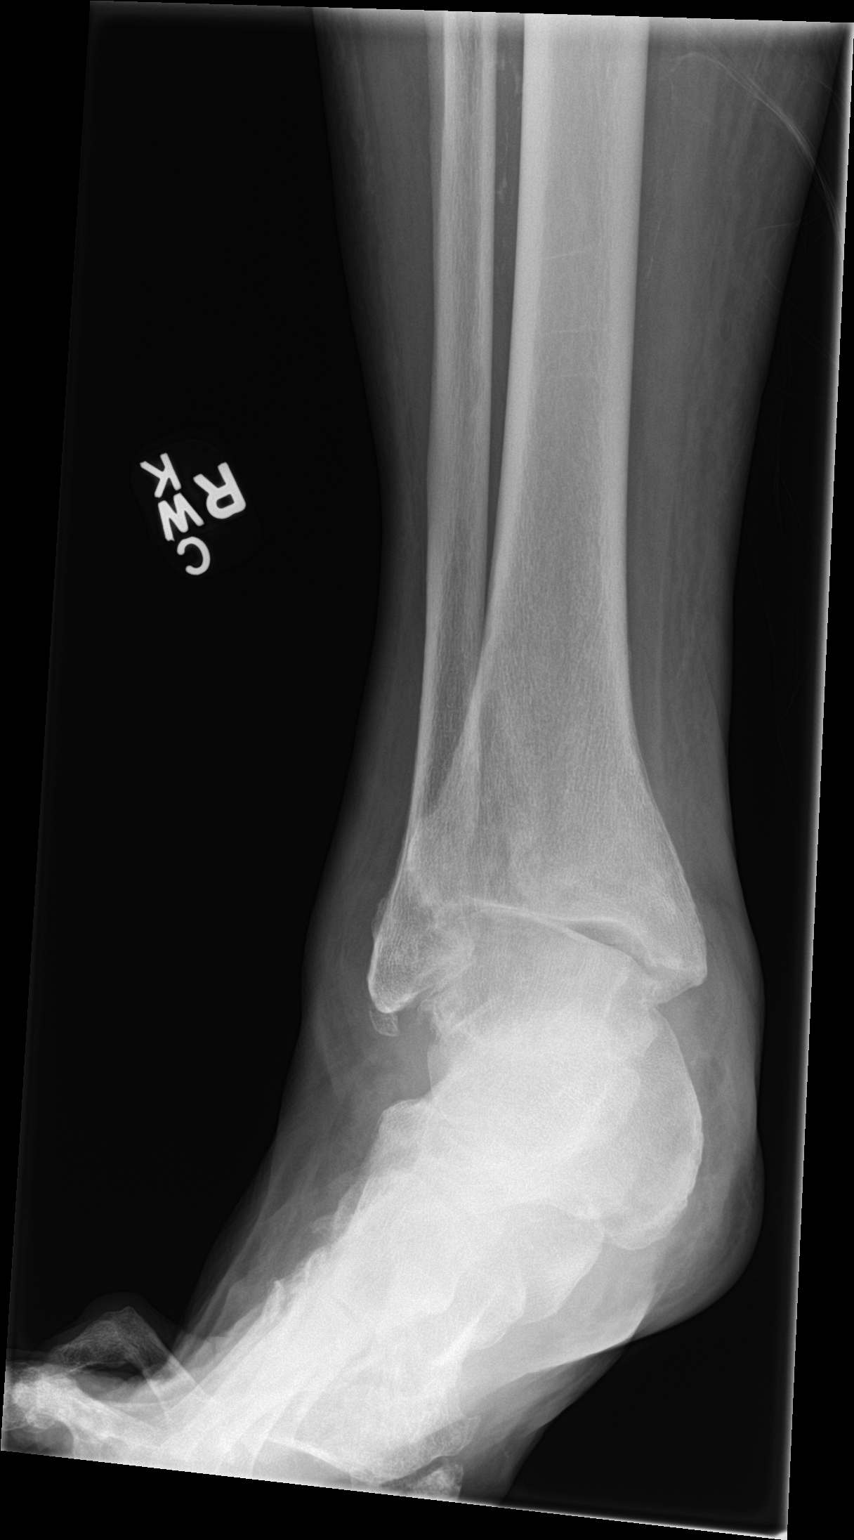

[2 of 2 positions shown; findings below may reference images not displayed]

FINDINGS: There is a well corticated ossific fragment adjacent to the lateral
malleolus likely reflecting sequela of prior trauma. There is marked
narrowing of the tibiotalar joint space with associated subchondral
sclerosis and osteophytosis. Irregularity of the talus may reflect
sequela of prior trauma. There is soft tissue swelling about the
ankle.
IMPRESSION: 1. Marked narrowing of the tibiotalar joint space and irregularity
of the talus may reflect sequela of prior trauma and posttraumatic
osteoarthritis.
2. Well corticated ossific fragment adjacent to the lateral
malleolus suggesting sequela of prior trauma.
3. No definite evidence of acute fracture.
4. Soft tissue swelling about the ankle.

## 2022-01-23 MED ORDER — MELATONIN 5 MG PO TABS
5.0000 mg | ORAL_TABLET | Freq: Every day | ORAL | Status: DC
  Administered 2022-01-23 – 2022-01-28 (×5): 5 mg via ORAL
  Filled 2022-01-23 (×5): qty 1

## 2022-01-23 MED ORDER — TAMSULOSIN HCL 0.4 MG PO CAPS
0.4000 mg | ORAL_CAPSULE | Freq: Every day | ORAL | Status: DC
  Administered 2022-01-23 – 2022-01-28 (×5): 0.4 mg via ORAL
  Filled 2022-01-23 (×6): qty 1

## 2022-01-23 MED ORDER — SODIUM BICARBONATE 650 MG PO TABS
1300.0000 mg | ORAL_TABLET | Freq: Four times a day (QID) | ORAL | Status: DC
  Administered 2022-01-23: 1300 mg via ORAL
  Filled 2022-01-23 (×2): qty 2

## 2022-01-23 MED ORDER — CHLORHEXIDINE GLUCONATE CLOTH 2 % EX PADS
6.0000 | MEDICATED_PAD | Freq: Every day | CUTANEOUS | Status: DC
  Administered 2022-01-23 – 2022-01-28 (×6): 6 via TOPICAL

## 2022-01-23 MED ORDER — MAGNESIUM SULFATE 4 GM/100ML IV SOLN
4.0000 g | Freq: Once | INTRAVENOUS | Status: AC
Start: 2022-01-23 — End: 2022-01-23
  Administered 2022-01-23: 4 g via INTRAVENOUS
  Filled 2022-01-23: qty 100

## 2022-01-23 MED ORDER — TRAZODONE HCL 50 MG PO TABS
50.0000 mg | ORAL_TABLET | Freq: Every evening | ORAL | Status: DC | PRN
  Administered 2022-01-23 – 2022-01-28 (×4): 50 mg via ORAL
  Filled 2022-01-23 (×4): qty 1

## 2022-01-23 MED ORDER — POTASSIUM CHLORIDE CRYS ER 20 MEQ PO TBCR
20.0000 meq | EXTENDED_RELEASE_TABLET | Freq: Once | ORAL | Status: AC
  Administered 2022-01-23: 20 meq via ORAL
  Filled 2022-01-23: qty 1

## 2022-01-23 MED ORDER — POTASSIUM CHLORIDE 10 MEQ/100ML IV SOLN
10.0000 meq | INTRAVENOUS | Status: AC
  Administered 2022-01-23 (×4): 10 meq via INTRAVENOUS
  Filled 2022-01-23 (×5): qty 100

## 2022-01-23 MED ORDER — MAGNESIUM SULFATE 4 GM/100ML IV SOLN
4.0000 g | Freq: Once | INTRAVENOUS | Status: AC
  Administered 2022-01-23: 4 g via INTRAVENOUS
  Filled 2022-01-23: qty 100

## 2022-01-23 MED ORDER — HYDROMORPHONE HCL 1 MG/ML IJ SOLN
0.5000 mg | INTRAMUSCULAR | Status: DC | PRN
  Filled 2022-01-23: qty 0.5

## 2022-01-23 NOTE — Progress Notes (Signed)
Patient ID: Devin Garza, male   DOB: 28-Sep-1941, 81 y.o.   MRN: [id number]  PROGRESS NOTE    Isabella Ida  JME:[id number] DOB: 12/20/1941 DOA: 01/22/2022 PCP: Isaac Bliss, Rayford Halsted, MD   Chief Complaint  Patient presents with   Fatigue    Brief Narrative:    Jonuel Butterfield is a 81 y.o. male with medical history significant of seizures, carotid artery disease, hyperlipidemia, GI bleed, gout, hypertension, anemia, basal cell carcinoma, GERD presenting with ongoing weakness and fatigue.  Patient states for last 7 to 10 days, he has felt generally weak with increased fatigue.  Also with decreased appetite and decreased p.o. intake for the past 4-5 days.  Denies any vomiting nor abdominal pain.  Reports some loose stools but denies any diarrhea nor blood.  Does report some chronic lower extremity edema.  He denies fevers, chills, chest pain, shortness of breath.    ED Course: Vital signs in the ED significant for heart rate in the 90s to 100s, respiratory rate in the 20s, blood pressure in the 229N 989Q systolic.  Lab work-up showed CMP with potassium 2.9, bicarb 13 with gap of 20, BUN 40 with creatinine of 2.65 from a baseline of around 1, glucose 110, calcium of 5.4 which corrects to 6 considering albumin of 3.2, AST 67.  CBC with hemoglobin of 11 which is stable.  BNP borderline at 108, troponin mildly elevated at 154 with repeat pending.  FOBT positive.  Respiratory panel for flu and COVID-negative.  Urinalysis pending.  Chest x-ray showed no acute abnormality and CT head also with no acute abnormality.  Patient received 1 g calcium gluconate in the ED as well as 2 L of IV fluids and 20 mEq of IV potassium over 2 hours.   Assessment & Plan:   Principal Problem:   Hypocalcemia Active Problems:   Mixed hyperlipidemia   Essential hypertension   Carotid artery stenosis   GERD without esophagitis   Hypokalemia   AKI (acute kidney injury) ([place])   Diarrhea -Patient with watery  diarrhea, will check C. difficile, will check GI panel -Hemoccult positive stool secondary to infectious etiology  AKI -creatinine significantly elevated, baseline around 1, it is 2.65 on admission, most likely volume depletion/Prerenal due to diarrhea -Continue with IV fluids, avoid nephrotoxic medications  Hypocalcemia -Repleted, continue to monitor closely  Severe hypokalemia-repleting with IV fluid, continue to monitor closely  Severe hypomagnesemia -Continue to monitor on telemetry, repleting aggressively as well, will recheck level this afternoon and replete as needed  Failure to thrive/deconditioning/Multiple falls/Right ankle pain -This is most likely due to renal failure, dehydration, and multiple electrolytes abnormalities causing multiple falls, PT/OT is consulted -We will check right ankle x-ray due to pain and tenderness to palpation.  Elevated BNP Elevated troponin - Patient noted to have borderline BNP at 108 and troponin at 154 on first check with repeat pending. - No chest pain or shortness of breath. - Has had some tachycardia this may represent demand ischemia. -  EKG without ischemic features -154Non-ACS pattern as troponin is stable>154 -As well troponin mainly elevated in the setting of AKI.  Urinary Retention - Started on Flomax, will request daily as staff were  unable to do any and out due to resistance.  Hyperlipidemia Carotid artery disease - Continue home atorvastatin   History of GI bleed Anemia > Hemoglobin stable at 11 > Patient did not notice any blood per rectum however FOBT was positive in ED. - Any GI bleeding is very  slow at this time we will continue to trend CBC for now  Hypertension -Blood pressure is elevated, continue with amlodipine and metoprolol, will add as needed hydralazine, will hold valsartan due to AKI.    GERD - Continue PPI  Pressure ulcers -Wound care consulted  DVT prophylaxis: SCD's Code Status: Full Family  Communication: none at ebdside. D/W patient at length and answered all questions. Disposition:   Status is: Inpatient  Remains inpatient appropriate because:  AKI, multiple severe electrolyte derangements.       Consultants:  none  Subjective:  Patient reports generalized weakness, fatigue, poor appetite  Objective: Vitals:   01/22/22 2153 01/23/22 0030 01/23/22 0200 01/23/22 0800  BP: 116/74 (!) 147/69 (!) 124/59 (!) 160/81  Pulse: (!) 105 94 85 (!) 122  Resp: (!) 26 20 20    Temp:   98.4 F (36.9 C) 97.6 F (36.4 C)  TempSrc:   Oral Oral  SpO2: 96% 95% 98% 98%  Weight:      Height:        Intake/Output Summary (Last 24 hours) at 01/23/2022 1031 Last data filed at 01/23/2022 0456 Gross per 24 hour  Intake 376.61 ml  Output --  Net 376.61 ml   Filed Weights   01/22/22 2034  Weight: 74.8 kg    Examination:  Awake Alert, Oriented X 3, frail, deconditioned Symmetrical Chest wall movement, Good air movement bilaterally, CTAB RRR,No Gallops,Rubs or new Murmurs, No Parasternal Heave +ve B.Sounds, Abd Soft, suprapubic fullness and tenderness to palpation  Right ankle edema and tenderness to palpation Skin wound in left upper arm, see pictures below.       Data Reviewed: I have personally reviewed following labs and imaging studies  CBC: Recent Labs  Lab 01/22/22 2213 01/23/22 0437  WBC 8.6 7.9  NEUTROABS 5.9  --   HGB 11.0* 10.4*  HCT 32.4* 30.4*  MCV 92.3 93.0  PLT 243 785    Basic Metabolic Panel: Recent Labs  Lab 01/22/22 2213 01/23/22 0000 01/23/22 0437 01/23/22 0930  NA 139 139 139 137  K 2.9* 2.8* 3.3* 3.8  CL 106 106 109 109  CO2 13* 13* 11* 11*  GLUCOSE 110* 108* 121* 117*  BUN 40* 40* 38* 41*  CREATININE 2.65* 2.48* 2.06* 1.75*  CALCIUM 5.4* 6.1* 5.9* 6.2*  MG  --  <0.5*  --  1.0*  PHOS  --  5.8*  --   --     GFR: Estimated Creatinine Clearance: 34.2 mL/min (A) (by C-G formula based on SCr of 1.75 mg/dL (H)).  Liver  Function Tests: Recent Labs  Lab 01/22/22 2213  AST 67*  ALT 32  ALKPHOS 51  BILITOT 0.8  PROT 6.6  ALBUMIN 3.2*    CBG: No results for input(s): GLUCAP in the last 168 hours.   Recent Results (from the past 240 hour(s))  Resp Panel by RT-PCR (Flu A&B, Covid) Nasopharyngeal Swab     Status: None   Collection Time: 01/22/22 10:13 PM   Specimen: Nasopharyngeal Swab; Nasopharyngeal(NP) swabs in vial transport medium  Result Value Ref Range Status   SARS Coronavirus 2 by RT PCR NEGATIVE NEGATIVE Final    Comment: (NOTE) SARS-CoV-2 target nucleic acids are NOT DETECTED.  The SARS-CoV-2 RNA is generally detectable in upper respiratory specimens during the acute phase of infection. The lowest concentration of SARS-CoV-2 viral copies this assay can detect is 138 copies/mL. A negative result does not preclude SARS-Cov-2 infection and should not be used as the sole  basis for treatment or other patient management decisions. A negative result may occur with  improper specimen collection/handling, submission of specimen other than nasopharyngeal swab, presence of viral mutation(s) within the areas targeted by this assay, and inadequate number of viral copies(<138 copies/mL). A negative result must be combined with clinical observations, patient history, and epidemiological information. The expected result is Negative.  Fact Sheet for Patients:  EntrepreneurPulse.com.au  Fact Sheet for Healthcare Providers:  IncredibleEmployment.be  This test is no t yet approved or cleared by the [place] FDA and  has been authorized for detection and/or diagnosis of SARS-CoV-2 by FDA under an Emergency Use Authorization (EUA). This EUA will remain  in effect (meaning this test can be used) for the duration of the COVID-19 declaration under Section 564(b)(1) of the Act, 21 U.S.C.section 360bbb-3(b)(1), unless the authorization is terminated  or revoked  sooner.       Influenza A by PCR NEGATIVE NEGATIVE Final   Influenza B by PCR NEGATIVE NEGATIVE Final    Comment: (NOTE) The Xpert Xpress SARS-CoV-2/FLU/RSV plus assay is intended as an aid in the diagnosis of influenza from Nasopharyngeal swab specimens and should not be used as a sole basis for treatment. Nasal washings and aspirates are unacceptable for Xpert Xpress SARS-CoV-2/FLU/RSV testing.  Fact Sheet for Patients: EntrepreneurPulse.com.au  Fact Sheet for Healthcare Providers: IncredibleEmployment.be  This test is not yet approved or cleared by the [place] FDA and has been authorized for detection and/or diagnosis of SARS-CoV-2 by FDA under an Emergency Use Authorization (EUA). This EUA will remain in effect (meaning this test can be used) for the duration of the COVID-19 declaration under Section 564(b)(1) of the Act, 21 U.S.C. section 360bbb-3(b)(1), unless the authorization is terminated or revoked.  Performed at [facility], [place] [address], [place], [place] [address]          Radiology Studies: CT Head Wo Contrast  Result Date: 01/22/2022 CLINICAL DATA:  Delirium EXAM: CT HEAD WITHOUT CONTRAST TECHNIQUE: Contiguous axial images were obtained from the base of the skull through the vertex without intravenous contrast. RADIATION DOSE REDUCTION: This exam was performed according to the departmental dose-optimization program which includes automated exposure control, adjustment of the mA and/or kV according to patient size and/or use of iterative reconstruction technique. COMPARISON:  10/15/2020 FINDINGS: Brain: There is no mass, hemorrhage or extra-axial collection. There is generalized atrophy without lobar predilection. Hypodensity of the white matter is most commonly associated with chronic microvascular disease. Vascular: Atherosclerotic calcification of the vertebral and internal carotid arteries at the skull base. No  abnormal hyperdensity of the major intracranial arteries or dural venous sinuses. Skull: The visualized skull base, calvarium and extracranial soft tissues are normal. Sinuses/Orbits: No fluid levels or advanced mucosal thickening of the visualized paranasal sinuses. No mastoid or middle ear effusion. The orbits are normal. IMPRESSION: 1. No acute intracranial abnormality. 2. Chronic microvascular ischemia and generalized atrophy. Electronically Signed   By: Ulyses Jarred M.D.   On: 01/22/2022 21:41   DG Chest Portable 1 View  Result Date: 01/22/2022 CLINICAL DATA:  Shortness of breath. EXAM: PORTABLE CHEST 1 VIEW COMPARISON:  October 15, 2020. FINDINGS: Stable cardiomediastinal silhouette. Both lungs are clear. The visualized skeletal structures are unremarkable. IMPRESSION: No active disease. Electronically Signed   By: Marijo Conception M.D.   On: 01/22/2022 21:21        Scheduled Meds:  amLODipine  10 mg Oral Daily   atorvastatin  10 mg Oral Daily  metoprolol succinate  100 mg Oral Daily   pantoprazole  40 mg Oral Daily   sodium chloride flush  3 mL Intravenous Q12H   tamsulosin  0.4 mg Oral QPC supper   Continuous Infusions:  calcium gluconate CONTINUOUS infusion (930 mg elemental calcium/L) 0.5 mg/kg/hr of elemental calcium (01/23/22 0104)     LOS: 0 days      Phillips Climes, MD [facility]   To contact the attending provider between 7A-7P or the covering provider during after hours 7P-7A, please log into the web site [email or web address] and access using universal [place] password for that web site. If you do not have the password, please call the hospital operator.  01/23/2022, 10:31 AM

## 2022-01-23 NOTE — Plan of Care (Signed)

## 2022-01-24 ENCOUNTER — Inpatient Hospital Stay (HOSPITAL_COMMUNITY): Payer: Medicare Other

## 2022-01-24 DIAGNOSIS — F10939 Alcohol use, unspecified with withdrawal, unspecified: Secondary | ICD-10-CM

## 2022-01-24 DIAGNOSIS — R569 Unspecified convulsions: Secondary | ICD-10-CM

## 2022-01-24 DIAGNOSIS — N179 Acute kidney failure, unspecified: Secondary | ICD-10-CM

## 2022-01-24 DIAGNOSIS — F10931 Alcohol use, unspecified with withdrawal delirium: Secondary | ICD-10-CM | POA: Diagnosis not present

## 2022-01-24 LAB — BASIC METABOLIC PANEL
Anion gap: 16 — ABNORMAL HIGH (ref 5–15)
Anion gap: 14 (ref 5–15)
BUN: 29 mg/dL — ABNORMAL HIGH (ref 8–23)
BUN: 22 mg/dL (ref 8–23)
CO2: 13 mmol/L — ABNORMAL LOW (ref 22–32)
CO2: 16 mmol/L — ABNORMAL LOW (ref 22–32)
Calcium: 7.2 mg/dL — ABNORMAL LOW (ref 8.9–10.3)
Calcium: 7.3 mg/dL — ABNORMAL LOW (ref 8.9–10.3)
Chloride: 107 mmol/L (ref 98–111)
Chloride: 109 mmol/L (ref 98–111)
Creatinine, Ser: 1.2 mg/dL (ref 0.61–1.24)
Creatinine, Ser: 1.14 mg/dL (ref 0.61–1.24)
GFR, Estimated: >60 mL/min (ref >60)
GFR, Estimated: >60 mL/min (ref >60)
Glucose, Bld: 141 mg/dL — ABNORMAL HIGH (ref 70–99)
Glucose, Bld: 148 mg/dL — ABNORMAL HIGH (ref 70–99)
Potassium: 4.4 mmol/L (ref 3.5–5.1)
Potassium: 3.6 mmol/L (ref 3.5–5.1)
Sodium: 136 mmol/L (ref 135–145)
Sodium: 139 mmol/L (ref 135–145)

## 2022-01-24 LAB — GLUCOSE, CAPILLARY
Glucose-Capillary: 147 mg/dL — ABNORMAL HIGH (ref 70–99)
Glucose-Capillary: 133 mg/dL — ABNORMAL HIGH (ref 70–99)
Glucose-Capillary: 98 mg/dL (ref 70–99)
Glucose-Capillary: 99 mg/dL (ref 70–99)

## 2022-01-24 LAB — CBC
HCT: 29.1 % — ABNORMAL LOW (ref 39.0–52.0)
Hemoglobin: 10.3 g/dL — ABNORMAL LOW (ref 13.0–17.0)
MCH: 32.4 pg (ref 26.0–34.0)
MCHC: 35.4 g/dL (ref 30.0–36.0)
MCV: 91.5 fL (ref 80.0–100.0)
Platelets: 223 10*3/uL (ref 150–400)
RBC: 3.18 MIL/uL — ABNORMAL LOW (ref 4.22–5.81)
RDW: 12.3 % (ref 11.5–15.5)
WBC: 7.4 10*3/uL (ref 4.0–10.5)
nRBC: 0 % (ref 0.0–0.2)

## 2022-01-24 LAB — PHOSPHORUS: Phosphorus: 2.5 mg/dL (ref 2.5–4.6)

## 2022-01-24 LAB — PARATHYROID HORMONE, INTACT (NO CA): PTH: 64 pg/mL (ref 15–65)

## 2022-01-24 LAB — MAGNESIUM
Magnesium: 1.6 mg/dL — ABNORMAL LOW (ref 1.7–2.4)
Magnesium: 2.5 mg/dL — ABNORMAL HIGH (ref 1.7–2.4)

## 2022-01-24 IMAGING — CT CT HEAD W/O CM
4 series · 15 of 47 positions shown, 17 images · non-contrast
Comparison: [DATE] [DATE], [DATE], [DATE] [DATE], [DATE]

CLINICAL DATA: Seizure, new-onset, no history of trauma



[Series 3: head wo · axial · 0.44mm/px · z∈[-63,+57]mm · 7 of 33 slices shown, 9 images]
[im 5/33  brain]
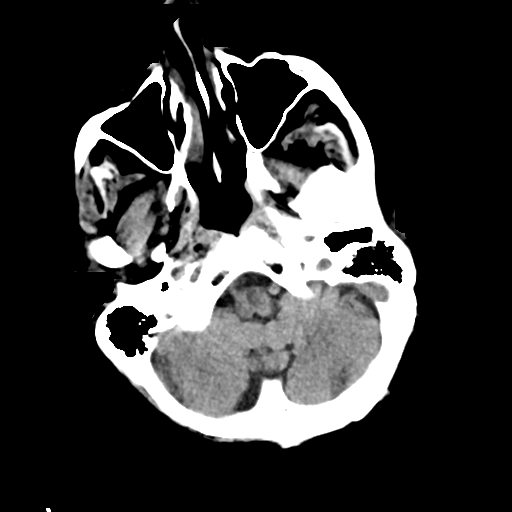
[im 5/33  bone]
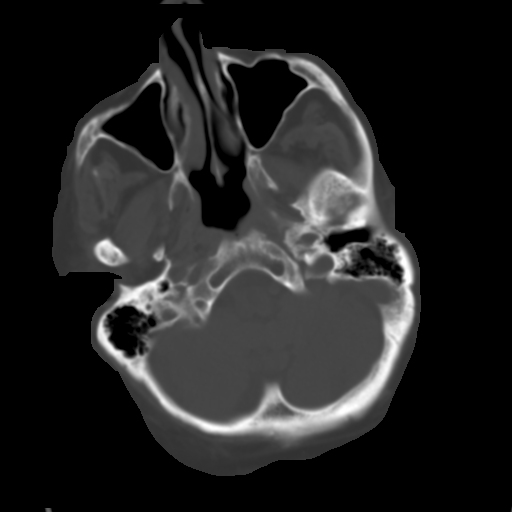
[im 9/33  brain]
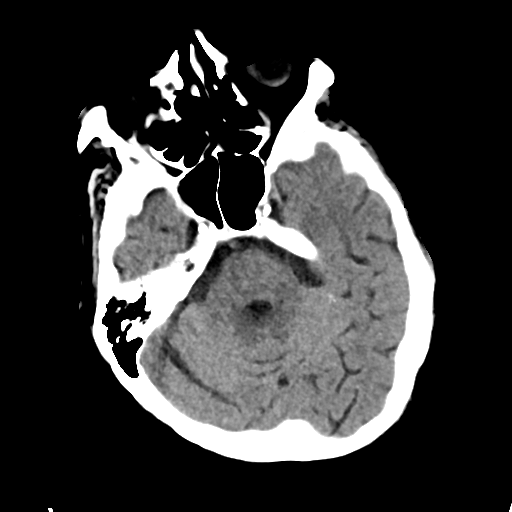
[im 13/33  brain]
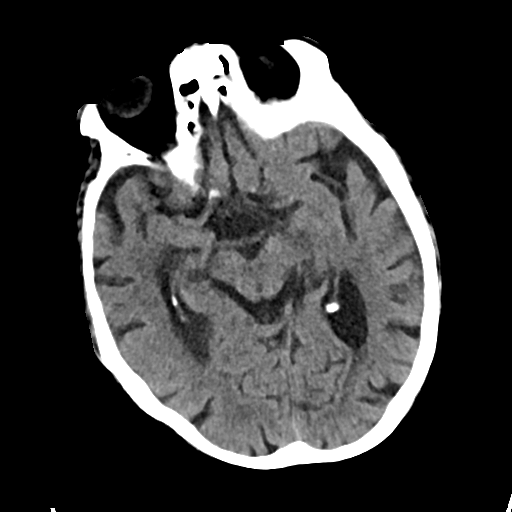
[im 17/33  brain]
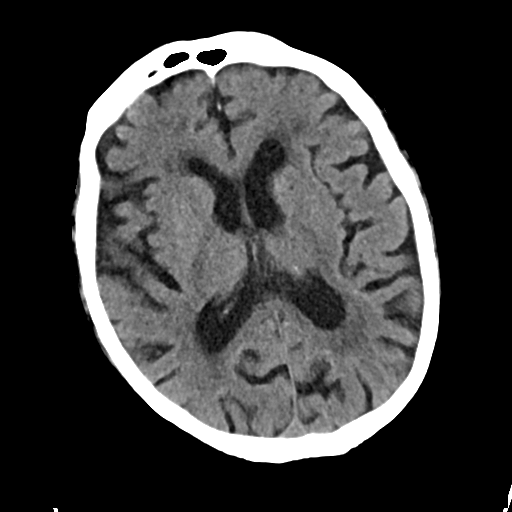
[im 21/33  brain]
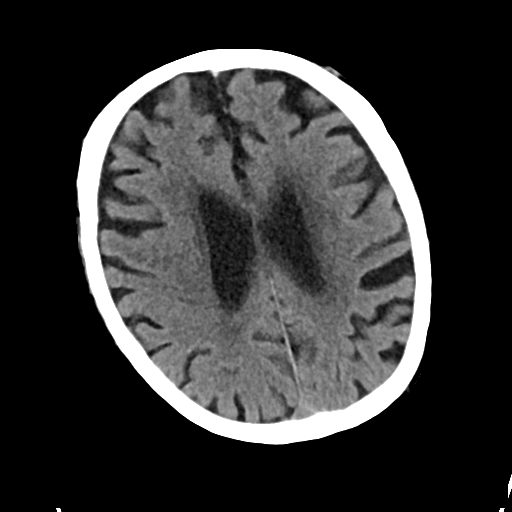
[im 21/33  bone]
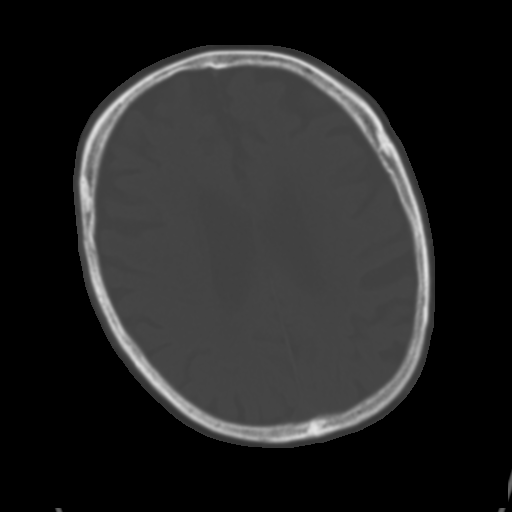
[im 25/33  brain]
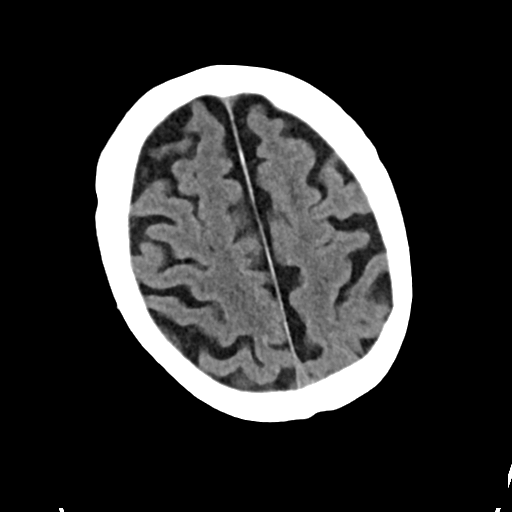
[im 29/33  brain]
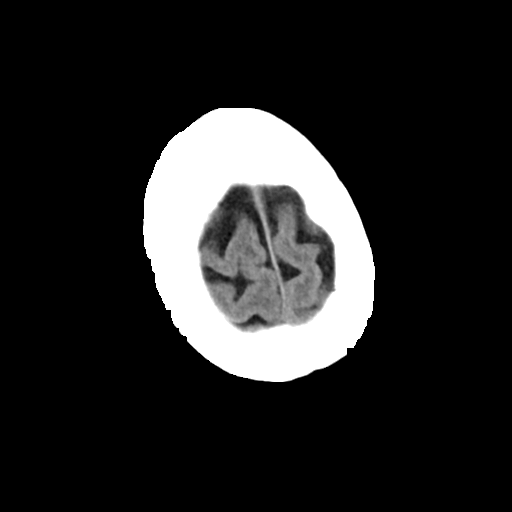

[Series 4: head bone · axial · 0.44mm/px · z∈[-67,-51]mm · 2 of 83 slices shown]
[im 9/83  bone]
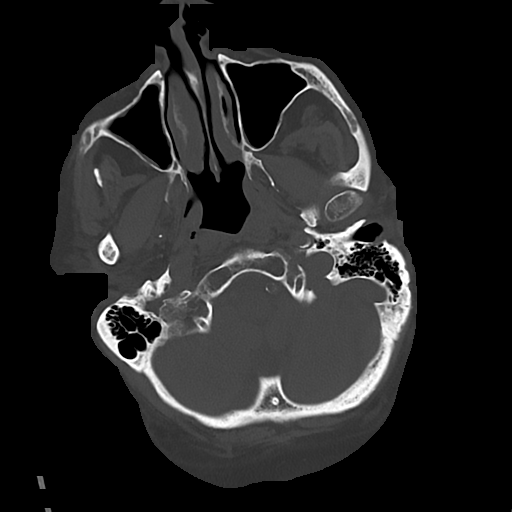
[im 17/83  bone]
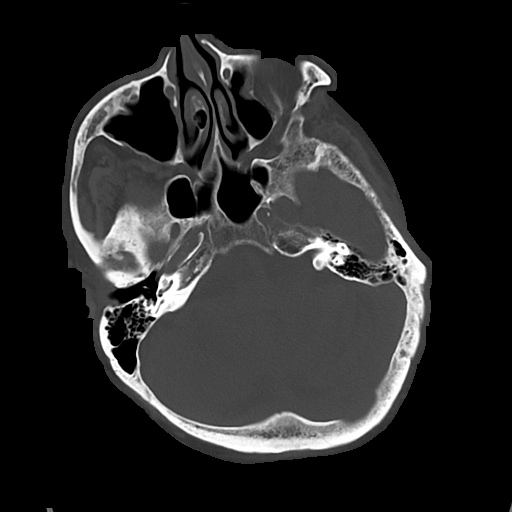

[Series 5: cor soft · coronal · 0.29mm/px · 3 of 76 slices shown]
[im 27/76  brain]
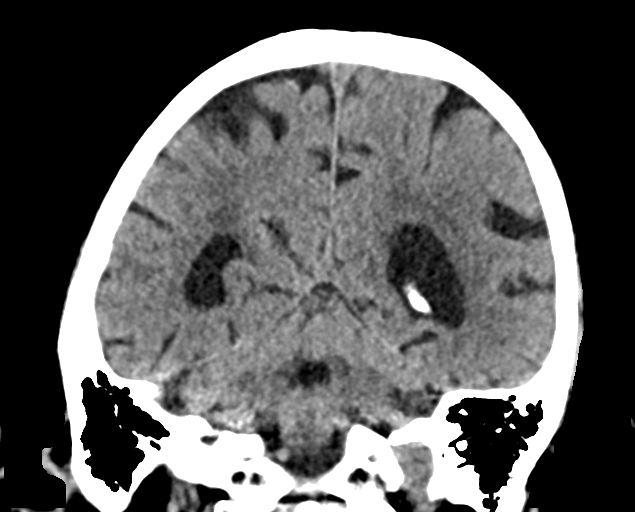
[im 34/76  brain]
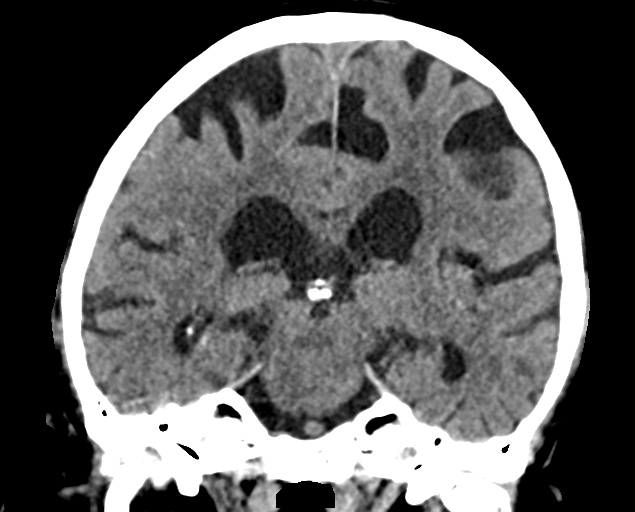
[im 42/76  brain]
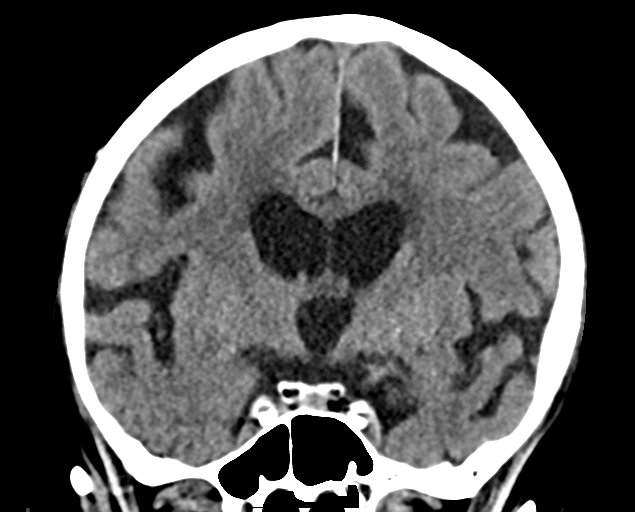

[Series 6: sag soft · sagittal · 0.29mm/px · 3 of 64 slices shown]
[im 22/64  brain]
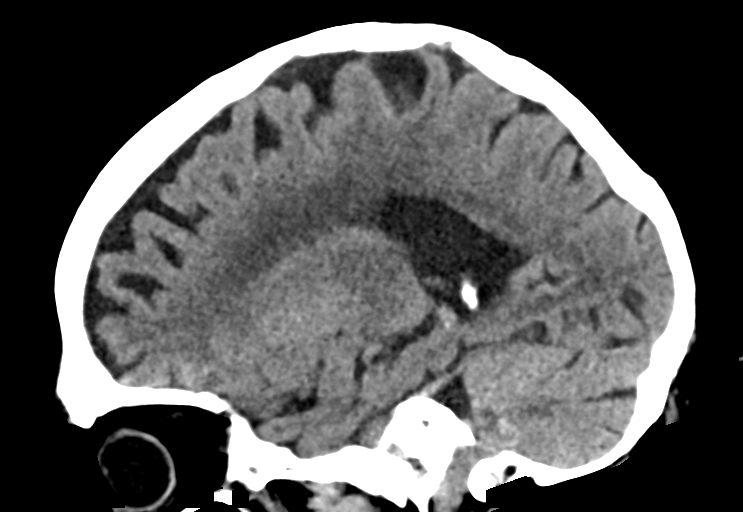
[im 32/64  brain]
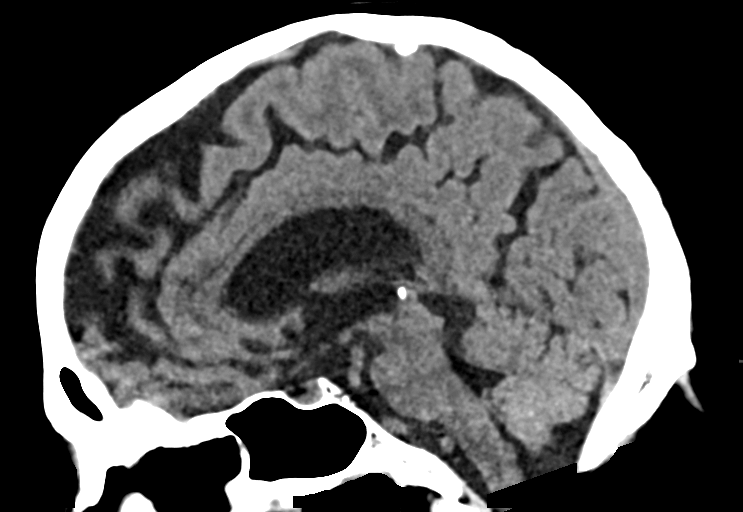
[im 43/64  brain]
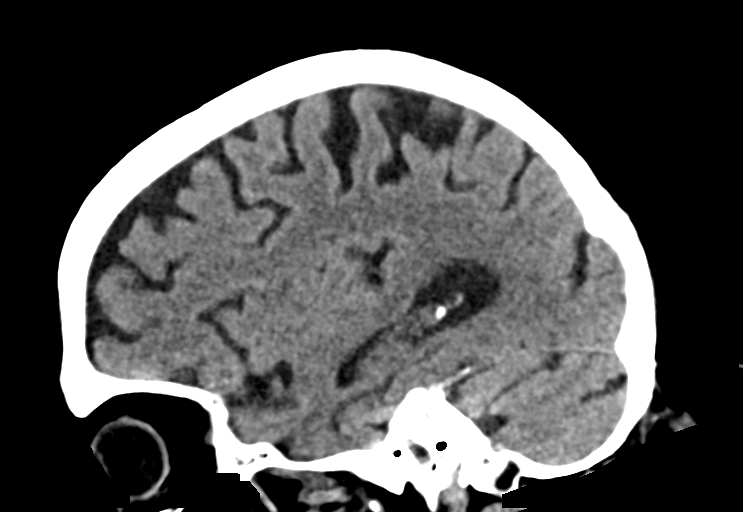

[15 of 47 positions shown; findings below may reference images not displayed]

FINDINGS: Brain: No evidence of acute infarction, hemorrhage, hydrocephalus,
extra-axial collection or mass lesion/mass effect. Global
parenchymal volume loss. Periventricular white matter hypodensities
consistent with sequela of chronic microvascular ischemic disease.

Vascular: Vascular calcifications of the carotid siphons.

Skull: Osteopenia. No acute skull fracture. Remote LEFT nasal bone
fracture.

Sinuses/Orbits: Partial opacification of multiple ethmoid air cells

Other: None.
IMPRESSION: No acute intracranial abnormality.

## 2022-01-24 MED ORDER — LORAZEPAM 2 MG/ML IJ SOLN
1.0000 mg | INTRAMUSCULAR | Status: DC | PRN
  Administered 2022-01-24 (×4): 2 mg via INTRAVENOUS
  Administered 2022-01-24 – 2022-01-26 (×2): 1 mg via INTRAVENOUS
  Filled 2022-01-24: qty 2
  Filled 2022-01-24: qty 1
  Filled 2022-01-24: qty 2
  Filled 2022-01-24 (×2): qty 1

## 2022-01-24 MED ORDER — THIAMINE HCL 100 MG/ML IJ SOLN: 100.0000 mg | Freq: Every day | INTRAMUSCULAR | Status: DC

## 2022-01-24 MED ORDER — LORAZEPAM 2 MG/ML IJ SOLN
0.5000 mg | Freq: Once | INTRAMUSCULAR | Status: AC
  Administered 2022-01-24: 0.5 mg via INTRAVENOUS
  Filled 2022-01-24: qty 1

## 2022-01-24 MED ORDER — CALCIUM GLUCONATE-NACL 1-0.675 GM/50ML-% IV SOLN
1.0000 g | Freq: Once | INTRAVENOUS | Status: AC
  Administered 2022-01-24: 1000 mg via INTRAVENOUS
  Filled 2022-01-24: qty 50

## 2022-01-24 MED ORDER — LOPERAMIDE HCL 2 MG PO CAPS
4.0000 mg | ORAL_CAPSULE | ORAL | Status: DC | PRN
  Administered 2022-01-27: 4 mg via ORAL
  Filled 2022-01-24: qty 2

## 2022-01-24 MED ORDER — POTASSIUM CHLORIDE 10 MEQ/100ML IV SOLN
10.0000 meq | INTRAVENOUS | Status: AC
  Administered 2022-01-24 (×4): 10 meq via INTRAVENOUS
  Filled 2022-01-24: qty 100

## 2022-01-24 MED ORDER — SODIUM CHLORIDE 0.9 % IV BOLUS: 500.0000 mL | Freq: Once | INTRAVENOUS | Status: DC | PRN

## 2022-01-24 MED ORDER — LEVETIRACETAM IN NACL 500 MG/100ML IV SOLN
500.0000 mg | Freq: Two times a day (BID) | INTRAVENOUS | Status: DC
  Administered 2022-01-24 – 2022-01-25 (×3): 500 mg via INTRAVENOUS
  Filled 2022-01-24 (×3): qty 100

## 2022-01-24 MED ORDER — THIAMINE HCL 100 MG PO TABS: 100.0000 mg | ORAL_TABLET | Freq: Every day | ORAL | Status: DC

## 2022-01-24 MED ORDER — DEXMEDETOMIDINE HCL IN NACL 400 MCG/100ML IV SOLN
0.4000 ug/kg/h | INTRAVENOUS | Status: DC
  Administered 2022-01-24: 0.4 ug/kg/h via INTRAVENOUS
  Administered 2022-01-25: 0.5 ug/kg/h via INTRAVENOUS
  Filled 2022-01-24 (×2): qty 100

## 2022-01-24 MED ORDER — ADULT MULTIVITAMIN W/MINERALS CH
1.0000 | ORAL_TABLET | Freq: Every day | ORAL | Status: DC
  Administered 2022-01-24 – 2022-01-29 (×5): 1 via ORAL
  Filled 2022-01-24 (×5): qty 1

## 2022-01-24 MED ORDER — THIAMINE HCL 100 MG/ML IJ SOLN
500.0000 mg | Freq: Three times a day (TID) | INTRAVENOUS | Status: AC
  Administered 2022-01-24 – 2022-01-26 (×9): 500 mg via INTRAVENOUS
  Filled 2022-01-24 (×10): qty 5

## 2022-01-24 MED ORDER — THIAMINE HCL 100 MG/ML IJ SOLN
100.0000 mg | Freq: Every day | INTRAMUSCULAR | Status: DC
  Administered 2022-01-27: 100 mg via INTRAVENOUS
  Filled 2022-01-24: qty 2

## 2022-01-24 MED ORDER — K PHOS MONO-SOD PHOS DI & MONO 155-852-130 MG PO TABS
250.0000 mg | ORAL_TABLET | Freq: Three times a day (TID) | ORAL | Status: DC
Start: 2022-01-24 — End: 2022-01-26
  Administered 2022-01-24 – 2022-01-25 (×3): 250 mg via ORAL
  Filled 2022-01-24 (×3): qty 1

## 2022-01-24 MED ORDER — THIAMINE HCL 100 MG PO TABS
100.0000 mg | ORAL_TABLET | Freq: Every day | ORAL | Status: DC
  Administered 2022-01-26 – 2022-01-29 (×3): 100 mg via ORAL
  Filled 2022-01-24 (×4): qty 1

## 2022-01-24 MED ORDER — SODIUM CHLORIDE 0.9 % IV SOLN
2000.0000 mg | Freq: Once | INTRAVENOUS | Status: AC
  Administered 2022-01-24: 2000 mg via INTRAVENOUS
  Filled 2022-01-24: qty 20

## 2022-01-24 MED ORDER — MAGNESIUM SULFATE 4 GM/100ML IV SOLN
4.0000 g | Freq: Once | INTRAVENOUS | Status: AC
  Administered 2022-01-24: 4 g via INTRAVENOUS
  Filled 2022-01-24: qty 100

## 2022-01-24 MED ORDER — PANTOPRAZOLE SODIUM 40 MG IV SOLR
40.0000 mg | Freq: Two times a day (BID) | INTRAVENOUS | Status: DC
  Administered 2022-01-24 – 2022-01-25 (×3): 40 mg via INTRAVENOUS
  Filled 2022-01-24 (×3): qty 40

## 2022-01-24 MED ORDER — LORAZEPAM 1 MG PO TABS
1.0000 mg | ORAL_TABLET | ORAL | Status: DC | PRN
  Administered 2022-01-24: 2 mg via ORAL
  Administered 2022-01-26: 1 mg via ORAL
  Administered 2022-01-26: 2 mg via ORAL
  Filled 2022-01-24: qty 1
  Filled 2022-01-24 (×2): qty 2

## 2022-01-24 MED ORDER — SODIUM BICARBONATE 650 MG PO TABS
1300.0000 mg | ORAL_TABLET | Freq: Four times a day (QID) | ORAL | Status: AC
  Administered 2022-01-24: 1300 mg via ORAL
  Filled 2022-01-24 (×2): qty 2

## 2022-01-24 MED ORDER — FOLIC ACID 1 MG PO TABS
1.0000 mg | ORAL_TABLET | Freq: Every day | ORAL | Status: DC
  Administered 2022-01-24 – 2022-01-29 (×5): 1 mg via ORAL
  Filled 2022-01-24 (×5): qty 1

## 2022-01-24 NOTE — Progress Notes (Signed)
In the ICU now. EEG with no seizures. Opening eyes to voice. Not  following commands consistently yet. Plan as in consult note.  -- Amie Portland, MD Neurologist [facility] Pager: [phone]

## 2022-01-24 NOTE — Progress Notes (Signed)
Pharmacy Electrolyte Replacement  Recent Labs:  Recent Labs    01/24/22 0229 01/24/22 1421  K 4.4 3.6  MG 1.6* 2.5*  PHOS 2.5  --   CREATININE 1.20 1.14    Low Critical Values (K </= 2.5, Phos </= 1, Mg </= 1) Present: None  MD Contacted: n/a  Plan: KCl 10 mEq IV x4. Repeat in AM.

## 2022-01-24 NOTE — Progress Notes (Signed)
[facility] Progress Note Patient Name: Devin Garza DOB: 1941-05-16 MRN: [id number]   Date of Service  01/24/2022  HPI/Events of Note  Bedside RN is requesting a precedex infusion since patient has been getting ativan and it is not helping and he is agitated  eICU Interventions  Seen on camera Restless, agitated HR 90. MAP 89. No distress Precedex ordered Watch closely , may end up needing intubation if his condition declines     Intervention Category Major Interventions: Delirium, psychosis, severe agitation - evaluation and management  Inaki Vantine G Mirage Pfefferkorn 01/24/2022, 11:01 PM

## 2022-01-24 NOTE — Progress Notes (Signed)
Pt has had increased confusion throughout shift.  Pt is becoming less redirectable.  Pt is only oriented to self at this time.  Pt thinks that he has been in multiple rooms and that he is sitting in a wheelchair, but patient has not been able to move out of bed.  Pt is argumentative with staff.  Earlier in shift, pt ripped off all of his monitoring equipment and his IV during agitation fit, but was able to be calmed down at that time.  Dr. Tonie Griffith and charge RN Lexine Baton have been kept in the loop regarding patient behavior.

## 2022-01-24 NOTE — Progress Notes (Signed)
PT Cancellation Note  Patient Details Name: Goldman Birchall MRN: [id number] DOB: October 03, 1941   Cancelled Treatment:    Reason Eval/Treat Not Completed: Medical issues which prohibited therapy  Noted seizure earlier today with transfer to 91M ICU;  Will follow up for PT eval tomorrow;   Roney Marion, [place] Pager [phone] Office [phone]    Colletta [place] 01/24/2022, 2:03 PM

## 2022-01-24 NOTE — Significant Event (Signed)
Rapid Response Event Note   Reason for Call :  Alcohol withdrawal induced seizure   Initial Focused Assessment:  Called to see this patient who had gone into a generalized tonic clonic seizure that lasted for about 3 minutes. Staff was in the room when it happened as well as MD. Per staff patient has been increasingly agitated and disoriented this shift.  When I got to the room the Rns were pushing ativan IV. The patient had received 4 mg previously. Patient protecting his airway but having sonorous respirations.   101/63 HR 91 RR 44 O2 sat 96  Plan of Care:  Patient will be transferred to ICU for further management. Patient given Keppra for his seizures.   Event Summary:   MD Notified: Dr. Waldron Labs bedside, CCM NP bedside Call Time: 0920 Arrival Time: 6720   Venetia Maxon, RN

## 2022-01-24 NOTE — Progress Notes (Signed)
EEG done at bedside. No skin breakdown noted. Results pending. 

## 2022-01-24 NOTE — Consult Note (Addendum)
NAME:  Devin Garza, MRN:  [id number], DOB:  1941/07/21, LOS: 1 ADMISSION DATE:  01/22/2022, CONSULTATION DATE:  01/24/2022 REFERRING MD:  Dr. Imogene Burn, CHIEF COMPLAINT:  ETOH withdrawal seizure    History of Present Illness:  Devin Garza is a 81 y.o. male with a PMH significant for 2 basal cell carcinoma, hypertension, hyperlipidemia, CAD, tobacco abuse, EtOH abuse, and anemia who presented to the ER 1/27 with complaints of fatigue, shortness of breath, and diarrhea that began 7 to 10 days prior to admission.  Patient also reported decreased oral intake but denies vomiting or diarrhea.  Patient was admitted per [facility] for treatment of multiple metabolic derangements including hypokalemia, hypomagnesemia hypokalemia glycemia hypocalcemia.   Mid morning of 1/29 patient had witnessed tonic-clonic seizure lasting approximately 3 minutes.  Seizure activity felt secondary to EtOH withdrawal.  He received 2 mg oral Ativan, 2 mg IM Ativan, and later 2 mg of IV Ativan with eventual cessation of seizure activity.  Post seizure patient seen post ictal with tachypnea currently protecting airway.  PCCM consulted for further management and transfer to ICU.  Neurology also consulted.  Pertinent  Medical History  Basal cell carcinoma GERD Hyperlipidemia Hypertension Osteoarthritis PVD CAD Anemia  Significant Hospital Events: Including procedures, antibiotic start and stop dates in addition to other pertinent events   1/27 admitted for failure to thrive and multiple metabolic derangements 1/29 patient had 3-minute tonic-clonic seizure felt secondary to EtOH withdrawal  Interim History / Subjective:  Somnolent  Objective   Blood pressure (!) 142/55, pulse 97, temperature 98 F (36.7 C), temperature source Axillary, resp. rate (!) 29, height 5\' 10"  (1.778 m), weight 74.8 kg, SpO2 98 %.        Intake/Output Summary (Last 24 hours) at 01/24/2022 0943 Last data filed at 01/23/2022 1858 Gross per 24  hour  Intake 97.6 ml  Output 1325 ml  Net -1227.4 ml   Filed Weights   01/22/22 2034  Weight: 74.8 kg    Examination: General: Acute on chronic ill-appearing elderly male lying in bed in no acute distress HEENT: [place]/AT, MM pink/moist, PERRL,  Neuro: Postictal/somnolent CV: s1s2 regular rate and rhythm, no murmur, rubs, or gallops,  PULM: Tachypneic, oxygen saturations mid 90s on room air, no increased work of breathing, no added breath sounds GI: soft, bowel sounds active in all 4 quadrants, non-tender, non-distended Extremities: warm/dry, no edema  Skin: no rashes or lesions  Resolved Hospital Problem list     Assessment & Plan:  Tonic-clonic seizure felt secondary to alcohol withdrawal -2-minute tonic-clonic seizure seen morning of 1/29.  Patient received 2 mg of oral Ativan followed by 2 mg of IM Ativan and later once IV established received 2 mg IV Ativan with eventual cessation of seizure activity History of EtOH use currently and EtOH withdrawal -Patient currently post-ictal but chart review reveals 2 glasses of wine per day P: Neuro called appreciate assistance Keppra load EEG per neuro  Seizure precautions CIWA scale Currently protecting airway but at risk for intubation Transfer ICU for close monitoring  Multiple metabolic derangements including: Hyperkalemia Hypocalcemia Hypomagnesemia P: Continue to trend be met Supplement as needed Correction of GI losses as able  Diarrhea -Patient continues to significant watery diarrhea -Hemoccult stool positive -C. difficile and GI panel negative P: Continue Flexi-Seal Strict intake and output  IV hydration Electrolyte management as above  Acute Kidney Injury -improved -Secondary to likely dehydration in the setting of significant GI losses P: Follow renal function  Monitor urine output  Trend Bmet Avoid nephrotoxins, Ensure adequate renal perfusion  IV hydration  Elevated BNP Elevated troponin History  of CAD Hypertension/hyperlipidemia -Home medications include Norvasc, Lipitor and metoprolol P: Continuous telemetry  Once able to safely take orals continue statin Trend BNP Strict intake and output  Daily weight to assess volume status Closely monitor renal function and electrolytes  Provide supplemtal oxygen as needed to sats greater than 90%  Urinary retention P: Continue Foley catheter Voiding trials in the near future  History of GERD P: PPI  Pressure ulcers P: Pressure alleviating devices Local wound care Optimize nutrition  Best Practice (right click and "Reselect all SmartList Selections" daily)   Diet/type: NPO DVT prophylaxis: SCD GI prophylaxis: PPI Lines: N/A Foley:  Yes, and it is still needed Code Status:  full code Last date of multidisciplinary goals of care discussion: Pending   Labs   CBC: Recent Labs  Lab 01/22/22 2213 01/23/22 0437 01/23/22 1651 01/24/22 0229  WBC 8.6 7.9 8.5 7.4  NEUTROABS 5.9  --   --   --   HGB 11.0* 10.4* 10.9* 10.3*  HCT 32.4* 30.4* 31.0* 29.1*  MCV 92.3 93.0 91.4 91.5  PLT 243 213 216 382    Basic Metabolic Panel: Recent Labs  Lab 01/23/22 0000 01/23/22 0437 01/23/22 0930 01/23/22 1651 01/24/22 0229  NA 139 139 137 137 136  K 2.8* 3.3* 3.8 3.6 4.4  CL 106 109 109 109 107  CO2 13* 11* 11* 13* 13*  GLUCOSE 108* 121* 117* 158* 141*  BUN 40* 38* 41* 35* 29*  CREATININE 2.48* 2.06* 1.75* 1.44* 1.20  CALCIUM 6.1* 5.9* 6.2* 7.0* 7.2*  MG <0.5*  --  1.0* 1.9 1.6*  PHOS 5.8*  --  3.5 2.8 2.5   GFR: Estimated Creatinine Clearance: 49.8 mL/min (by C-G formula based on SCr of 1.2 mg/dL). Recent Labs  Lab 01/22/22 2213 01/23/22 0437 01/23/22 1651 01/24/22 0229  WBC 8.6 7.9 8.5 7.4    Liver Function Tests: Recent Labs  Lab 01/22/22 2213  AST 67*  ALT 32  ALKPHOS 51  BILITOT 0.8  PROT 6.6  ALBUMIN 3.2*   No results for input(s): LIPASE, AMYLASE in the last 168 hours. No results for input(s):  AMMONIA in the last 168 hours.  ABG    Component Value Date/Time   HCO3 23.0 10/16/2020 2258   TCO2 21 (L) 10/15/2020 2211   ACIDBASEDEF 0.5 10/16/2020 2258   O2SAT 35.3 10/16/2020 2258     Coagulation Profile: No results for input(s): INR, PROTIME in the last 168 hours.  Cardiac Enzymes: Recent Labs  Lab 01/23/22 1651  CKTOTAL 2,944*    HbA1C: Hgb A1c MFr Bld  Date/Time Value Ref Range Status  10/16/2020 02:51 AM 5.7 (H) 4.8 - 5.6 % Final    Comment:    (NOTE)         Prediabetes: 5.7 - 6.4         Diabetes: >6.4         Glycemic control for adults with diabetes: <7.0   02/25/2020 12:52 PM 5.2 4.6 - 6.5 % Final    Comment:    Glycemic Control Guidelines for People with Diabetes:Non Diabetic:  <6%Goal of Therapy: <7%Additional Action Suggested:  >8%     CBG: No results for input(s): GLUCAP in the last 168 hours.  Review of Systems:   Unable to assess  Past Medical History:  He,  has a past medical history of Actinic keratosis, Anemia, Basal cell adenocarcinoma (09/03/2008),  Basal cell carcinoma (11/06/2008), Basal cell carcinoma (11/13/2008), Basal cell carcinoma (10/09/2009), Basal cell carcinoma (06/11/2013), Basal cell carcinoma (07/30/2015), Basal cell carcinoma (07/30/2015), Basal cell carcinoma (08/25/2016), Basal cell carcinoma (03/16/2017), Basal cell carcinoma (05/10/2018), Basal cell carcinoma of lip (2007), Blood transfusion without reported diagnosis, ED (erectile dysfunction), GERD (gastroesophageal reflux disease), HLD (hyperlipidemia), squamous cell carcinoma of skin (08/25/2016), Hypertension, Osteoarthritis, Peripheral vascular disease ([place]), Squamous cell carcinoma of skin (08/25/2016), Tobacco abuse, Tubular adenoma of colon (2005), and Wears dentures.   Surgical History:   Past Surgical History:  Procedure Laterality Date   ANKLE ARTHROSCOPY W/ OPEN REPAIR Left    COLONOSCOPY  2006   [place]-Tubular adenoma   COLONOSCOPY N/A 07/23/2015   Procedure:  COLONOSCOPY;  Surgeon: Lucilla Lame, MD;  Location: [place];  Service: Gastroenterology;  Laterality: N/A;  WITH BIOPSIES---- TRANSVERSE COLON POLYP DESCENDING COLON POLYP  X  4   ESOPHAGOGASTRODUODENOSCOPY N/A 07/23/2015   Procedure: ESOPHAGOGASTRODUODENOSCOPY (EGD);  Surgeon: Lucilla Lame, MD;  Location: [place];  Service: Gastroenterology;  Laterality: N/A;  WITH BIOPSY--- DUODENAL BIOPSY GASTRIC BIOPSY   ESOPHAGOGASTRODUODENOSCOPY N/A 10/28/2016   Procedure: ESOPHAGOGASTRODUODENOSCOPY (EGD);  Surgeon: Manya Silvas, MD;  Location[facility] ENDOSCOPY;  Service: Endoscopy;  Laterality: N/A;   ESOPHAGOGASTRODUODENOSCOPY (EGD) WITH PROPOFOL N/A 09/02/2017   Procedure: ESOPHAGOGASTRODUODENOSCOPY (EGD) WITH PROPOFOL;  Surgeon: Manya Silvas, MD;  Location[facility];  Service: Endoscopy;  Laterality: N/A;   SKIN CANCER EXCISION     LIP   TONSILLECTOMY     age 71   UPPER GI ENDOSCOPY  12/2009   Dr. Ivory Broad gastritisl negative for H. pylori; duodenal biopsies neg for sprue     Social History:   reports that he quit smoking about 22 months ago. His smoking use included cigarettes. He has a 20.00 pack-year smoking history. He has never used smokeless tobacco. He reports current alcohol use of about 14.0 standard drinks per week. He reports that he does not use drugs.   Family History:  His family history includes CAD in his father; Hearing loss in his father; Heart attack in his father; Lung cancer (age of onset: 66) in his mother; Ovarian cancer in his sister; Throat cancer in his father.   Allergies No Known Allergies   Home Medications  Prior to Admission medications   Medication Sig Start Date End Date Taking? Authorizing Provider  amLODipine (NORVASC) 10 MG tablet Take 1 tablet (10 mg total) by mouth daily. 07/21/21  Yes Isaac Bliss, Rayford Halsted, MD  atorvastatin (LIPITOR) 10 MG tablet Take 1 tablet (10 mg total) by mouth daily. 07/21/21  Yes Isaac Bliss, Rayford Halsted, MD  buPROPion (WELLBUTRIN XL) 150 MG 24 hr tablet Take 1 tablet (150 mg total) by mouth daily. 12/10/21  Yes Erline Hau, MD  colchicine 0.6 MG tablet TAKE 1 TABLET BY MOUTH 2 TIMES DAILY. Patient taking differently: Take 0.6 mg by mouth 2 (two) times daily. 12/10/20  Yes Isaac Bliss, Rayford Halsted, MD  Diclofenac Sodium 1.5 % SOLN Apply 1 mL topically 4 (four) times daily as needed. Patient taking differently: Apply 1 mL topically 4 (four) times daily as needed (pain). 02/04/21  Yes Mcarthur Rossetti, MD  DIOVAN HCT 320-12.5 MG tablet Take 1 tablet by mouth daily. 12/10/21  Yes Isaac Bliss, Rayford Halsted, MD  esomeprazole (NEXIUM 24HR) 20 MG capsule Take 2 capsules (40 mg total) by mouth 2 (two) times daily before a meal. 10/24/20  Yes Medina-Vargas, Monina C, NP  eszopiclone (LUNESTA) 2 MG TABS tablet Take 1 tablet (2 mg total) by mouth at bedtime as needed for sleep. Take immediately before bedtime 12/10/21  Yes Isaac Bliss, Rayford Halsted, MD  loratadine (CLARITIN) 10 MG tablet Take 1 tablet (10 mg total) by mouth daily as needed for allergies. 10/24/20  Yes Medina-Vargas, Monina C, NP  magnesium oxide (MAG-OX) 400 MG tablet Take 1 tablet by mouth morning, noon and at bedtime 07/21/21  Yes Isaac Bliss, Rayford Halsted, MD  metoprolol succinate (TOPROL-XL) 100 MG 24 hr tablet TAKE 1 TABLET DAILY  TAKE WITH OR IMMEDIATELY FOLLOWING A MEAL 12/10/21  Yes Isaac Bliss, Rayford Halsted, MD  Multiple Vitamin (MULTIVITAMIN) tablet Take 1 tablet by mouth daily.   Yes [provider]     Critical care time:     Performed by: Dany Harten D. Harris  Total critical care time: 45 minutes  Critical care time was exclusive of separately billable procedures and treating other patients.  Critical care was necessary to treat or prevent imminent or life-threatening deterioration.  Critical care was time spent personally by me on the following activities: development of  treatment plan with patient and/or surrogate as well as nursing, discussions with consultants, evaluation of patient's response to treatment, examination of patient, obtaining history from patient or surrogate, ordering and performing treatments and interventions, ordering and review of laboratory studies, ordering and review of radiographic studies, pulse oximetry and re-evaluation of patient's condition.  Filipe Greathouse D. Kenton Kingfisher, NP-C [facility] Personal contact information can be found on Amion  01/24/2022, 10:04 AM

## 2022-01-24 NOTE — Consult Note (Signed)
Neurology Consultation  Reason for Consult: Seizure Referring Physician: Dr. Waldron Labs  CC: Seizure  History is obtained from: Chart  HPI: Devin Garza is a 81 y.o. male prior history of provoked seizure not on antiepileptic, coronary artery disease, hyperlipidemia, GI bleeding, hypertension, presented to the hospital for admission due to weakness and fatigue of 7 to 10 days with worsening symptoms.  Also had decreased appetite and p.o. intake.  No abdominal pain or vomiting but had some loose stools.  He was admitted and found to have hypocalcemia and hypokalemia along with AKI which was being managed by the primary team.  He was admitted the night of January 22, 2022. He started become somewhat confused over the shift overnight per nursing report and appears to have gone into alcohol withdrawal-has a history of significant alcohol intake per charting. I was called this morning due to witnessed tonic-clonic seizure lasting about 3 minutes on the floor.  He was given 2 mg of Ativan which led to cessation of the seizure activity.  He remained tachypneic with sonorous breathing and confused and less responsive after this.  Neurology was consulted for further management  He was seen in 2020 by my service for concern for seizures in the setting of toxic metabolic derangements and no antibiotic was started due to first event and provoked by electrolyte derangements.  Unclear if he had any other recurrence of seizure activity since then prior to this hospitalization.  No family member at bedside or reachable at this time  LKW: Sometime last night-no clear time tpa given?: no, unclear last known well Premorbid modified Rankin scale (mRS): Unable to ascertain from the chart  ROS: Unable to obtain due to altered mental status.   Past Medical History:  Diagnosis Date   Actinic keratosis    Anemia    Basal cell adenocarcinoma 09/03/2008   Qualifier: Diagnosis of  By: Lorelei Pont MD, Frederico Hamman    Overview:   Overview:  Qualifier: Diagnosis of  By: Copland MD, Spencer    Basal cell carcinoma 11/06/2008   Right mastoid/neck.    Basal cell carcinoma 11/13/2008   Left lateral nose medial infraorbital ~2cm lat. to med. canthus. Excised 01/08/2009, margins free.   Basal cell carcinoma 10/09/2009   Right upper back, paraspinal inf. to base of neck. Excised 12/03/2009   Basal cell carcinoma 06/11/2013   Left lateral forehead. Excised 07/18/2013, margins free.   Basal cell carcinoma 07/30/2015   Right nasal ala. Nodular pattern.   Basal cell carcinoma 07/30/2015   Right mid to sup. helix. Nodular.   Basal cell carcinoma 08/25/2016   Right mid to sup. helix. Superficial with focal infiltration. Excised 09/28/2016, margins free.   Basal cell carcinoma 03/16/2017   Right mid to sup. helix. Mixed pattern, ulcerated   Basal cell carcinoma 05/10/2018   Right nasal tip. BCC with focal sclerosis.   Basal cell carcinoma of lip 2007   multiple sites   Blood transfusion without reported diagnosis    ED (erectile dysfunction)    GERD (gastroesophageal reflux disease)    HLD (hyperlipidemia)    Hx of squamous cell carcinoma of skin 08/25/2016   L superior pectoral   Hypertension    Osteoarthritis    Peripheral vascular disease ([place])    Occlusion and Stenosis of Carotid Artery   Squamous cell carcinoma of skin 08/25/2016   Left superior pectoral. KA pattern. EDC.   Tobacco abuse    Tubular adenoma of colon 2005   Wears dentures  full upper and lower     Family History  Problem Relation Age of Onset   Lung cancer Mother 40   Hearing loss Father    Throat cancer Father    Heart attack Father    CAD Father    Ovarian cancer Sister      Social History:   reports that he quit smoking about 22 months ago. His smoking use included cigarettes. He has a 20.00 pack-year smoking history. He has never used smokeless tobacco. He reports current alcohol use of about 14.0 standard drinks per week. He  reports that he does not use drugs.  Medications  Current Facility-Administered Medications:    acetaminophen (TYLENOL) tablet 650 mg, 650 mg, Oral, Q6H PRN, 650 mg at 01/24/22 1027 **OR** acetaminophen (TYLENOL) suppository 650 mg, 650 mg, Rectal, Q6H PRN, Marcelyn Bruins, MD   amLODipine (NORVASC) tablet 10 mg, 10 mg, Oral, Daily, Marcelyn Bruins, MD, 10 mg at 01/24/22 2536   calcium gluconate 1 g/ 50 mL sodium chloride IVPB, 1 g, Intravenous, Once, Elgergawy, Silver Huguenin, MD   Chlorhexidine Gluconate Cloth 2 % PADS 6 each, 6 each, Topical, Daily, Elgergawy, Silver Huguenin, MD, 6 each at 64/40/34 7425   folic acid (FOLVITE) tablet 1 mg, 1 mg, Oral, Daily, Elgergawy, Silver Huguenin, MD, 1 mg at 01/24/22 9563   HYDROmorphone (DILAUDID) injection 0.5 mg, 0.5 mg, Intravenous, Q3H PRN, Chotiner, Yevonne Aline, MD   levETIRAcetam (KEPPRA) 2,000 mg in sodium chloride 0.9 % 250 mL IVPB, 2,000 mg, Intravenous, Once, Elgergawy, Silver Huguenin, MD   loperamide (IMODIUM) capsule 4 mg, 4 mg, Oral, PRN, Elgergawy, Silver Huguenin, MD   LORazepam (ATIVAN) tablet 1-4 mg, 1-4 mg, Oral, Q1H PRN, 2 mg at 01/24/22 8756 **OR** LORazepam (ATIVAN) injection 1-4 mg, 1-4 mg, Intravenous, Q1H PRN, Elgergawy, Silver Huguenin, MD, 2 mg at 01/24/22 0929   magnesium sulfate IVPB 4 g 100 mL, 4 g, Intravenous, Once, Elgergawy, Silver Huguenin, MD   melatonin tablet 5 mg, 5 mg, Oral, QHS, Elgergawy, Silver Huguenin, MD, 5 mg at 01/23/22 2019   metoprolol succinate (TOPROL-XL) 24 hr tablet 100 mg, 100 mg, Oral, Daily, Marcelyn Bruins, MD, 100 mg at 01/24/22 4332   multivitamin with minerals tablet 1 tablet, 1 tablet, Oral, Daily, Elgergawy, Silver Huguenin, MD, 1 tablet at 01/24/22 0829   pantoprazole (PROTONIX) EC tablet 40 mg, 40 mg, Oral, Daily, Marcelyn Bruins, MD, 40 mg at 01/24/22 9518   phosphorus (K PHOS NEUTRAL) tablet 250 mg, 250 mg, Oral, TID, Elgergawy, Silver Huguenin, MD, 250 mg at 01/24/22 8416   sodium bicarbonate tablet 1,300 mg, 1,300 mg, Oral, QID, Elgergawy,  Silver Huguenin, MD, 1,300 mg at 01/24/22 6063   sodium chloride flush (NS) 0.9 % injection 3 mL, 3 mL, Intravenous, Q12H, Marcelyn Bruins, MD, 3 mL at 01/23/22 2220   tamsulosin (FLOMAX) capsule 0.4 mg, 0.4 mg, Oral, QPC supper, Elgergawy, Silver Huguenin, MD, 0.4 mg at 01/23/22 1022   [START ON 01/27/2022] thiamine tablet 100 mg, 100 mg, Oral, Daily **OR** [START ON 01/27/2022] thiamine (B-1) injection 100 mg, 100 mg, Intravenous, Daily, Elgergawy, Silver Huguenin, MD   thiamine 500mg  in normal saline (74ml) IVPB, 500 mg, Intravenous, Q8H, Elgergawy, Silver Huguenin, MD   traZODone (DESYREL) tablet 50 mg, 50 mg, Oral, QHS PRN, Chotiner, Yevonne Aline, MD, 50 mg at 01/23/22 2258   Exam: Current vital signs: BP (!) 142/55    Pulse 97    Temp 98 F (36.7 C) (Axillary)  Resp (!) 29    Ht 5\' 10"  (1.778 m)    Wt 74.8 kg    SpO2 98%    BMI 23.68 kg/m  Vital signs in last 24 hours: Temp:  [98 F (36.7 C)-99.9 F (37.7 C)] 98 F (36.7 C) (01/29 0744) Pulse Rate:  [85-161] 97 (01/29 0823) Resp:  [17-29] 29 (01/29 0744) BP: (122-152)/(55-85) 142/55 (01/29 0823) SpO2:  [96 %-98 %] 98 % (01/29 0744) General: Obtunded, does not open eyes to voice HEENT: Normocephalic/atraumatic CVS: Regular rate rhythm Respiratory: Somewhat sonorous breathing but still protecting his airway Abdomen nondistended nontender Extremities warm well perfused Neurological exam Is obtunded Does not open eyes to voice Does not follow commands To noxious simulation, localizes with the right upper extremity somewhat stronger than the left. Cranial nerves: Pupils are equal round reactive light, no gaze preference or deviation, does not blink to threat from either side, no facial asymmetry, tongue and palate midline. Motor examination: To noxious stimulation brisk localization with right upper extremity.  Localization with left upper extremity is somewhat weaker than right.  Withdraws both lower extremities to noxious stimulation Sensory examination: As  above NIH stroke scale--10   Labs I have reviewed labs in epic and the results pertinent to this consultation are:   CBC    Component Value Date/Time   WBC 7.4 01/24/2022 0229   RBC 3.18 (L) 01/24/2022 0229   HGB 10.3 (L) 01/24/2022 0229   HCT 29.1 (L) 01/24/2022 0229   PLT 223 01/24/2022 0229   MCV 91.5 01/24/2022 0229   MCH 32.4 01/24/2022 0229   MCHC 35.4 01/24/2022 0229   RDW 12.3 01/24/2022 0229   LYMPHSABS 1.1 01/22/2022 2213   MONOABS 1.5 (H) 01/22/2022 2213   EOSABS 0.0 01/22/2022 2213   BASOSABS 0.0 01/22/2022 2213    CMP     Component Value Date/Time   NA 136 01/24/2022 0229   K 4.4 01/24/2022 0229   CL 107 01/24/2022 0229   CO2 13 (L) 01/24/2022 0229   GLUCOSE 141 (H) 01/24/2022 0229   BUN 29 (H) 01/24/2022 0229   CREATININE 1.20 01/24/2022 0229   CREATININE 0.88 10/31/2020 1024   CALCIUM 7.2 (L) 01/24/2022 0229   PROT 6.6 01/22/2022 2213   ALBUMIN 3.2 (L) 01/22/2022 2213   AST 67 (H) 01/22/2022 2213   ALT 32 01/22/2022 2213   ALKPHOS 51 01/22/2022 2213   BILITOT 0.8 01/22/2022 2213   GFRNONAA >60 01/24/2022 0229   GFRAA >60 05/12/2020 1020    Lipid Panel     Component Value Date/Time   CHOL 190 02/22/2020 0746   TRIG 56.0 02/22/2020 0746   HDL 86.20 02/22/2020 0746   CHOLHDL 2 02/22/2020 0746   VLDL 11.2 02/22/2020 0746   LDLCALC 93 02/22/2020 0746   LDLDIRECT 132.0 01/18/2011 0914     Imaging I have reviewed the images obtained: CT head 01/22/2022-no acute abnormalities. Ordered 1 for today-pending  Assessment: 81 year old past history of provoked seizure not on antiepileptic, CAD, hyperlipidemia, GI bleeding, hypertension presented due to weakness and fatigue with hypokalemia and hypocalcemia, went into alcohol withdrawal overnight and had a seizure witnessed by the nursing staff requiring Ativan for resolution this morning. Remained postictal and lethargic on my exam. Question some focal left-sided weakness at this time. Not a  candidate for thrombolysis due to unknown last known well. Likely seizure in the setting of alcohol withdrawal. Now this being more than 1 seizure although still might be provoked, I would continue him on  antiepileptics.  Impression: Seizure-evaluate for status epilepticus Possible alcohol withdrawal  Recommendations: Loaded with Keppra 2 g IV x1 Continue Keppra 500 twice daily Head CT ordered. MRI brain when able to EEG-I have notified the technologist Will need continuous EEG if there is any evidence of ongoing seizure activity on the spot EEG Seizure precautions Correction of toxic metabolic derangements including renal dysfunction per primary team CIWA protocol Discussed my plan with PCCM APP Gerald Leitz. Neurology will follow -- Amie Portland, MD Neurologist [facility] Pager: [phone]  [place] Performed by: Amie Portland, MD Total critical care time: 32 minutes Critical care time was exclusive of separately billable procedures and treating other patients and/or supervising APPs/Residents/Students Critical care was necessary to treat or prevent imminent or life-threatening deterioration due to seizure versus status epilepticus This patient is critically ill and at significant risk for neurological worsening and/or death and care requires constant monitoring. Critical care was time spent personally by me on the following activities: development of treatment plan with patient and/or surrogate as well as nursing, discussions with consultants, evaluation of patient's response to treatment, examination of patient, obtaining history from patient or surrogate, ordering and performing treatments and interventions, ordering and review of laboratory studies, ordering and review of radiographic studies, pulse oximetry, re-evaluation of patient's condition, participation in multidisciplinary rounds and medical decision making of high complexity in the care  of this patient.

## 2022-01-24 NOTE — Procedures (Signed)
Patient Name: Devin Garza  MRN: [id number]  Epilepsy Attending: Lora Havens  Referring Physician/Provider: Amie [place], MD Date: 01/24/2022 Duration: 22.05 mins  Patient history: 81 year old past history of provoked seizure not on antiepileptic, CAD, hyperlipidemia, GI bleeding, hypertension presented due to weakness and fatigue with hypokalemia and hypocalcemia, went into alcohol withdrawal overnight and had a seizure witnessed by the nursing staff requiring Ativan for resolution this morning. EEG to evaluate for seizure  Level of alertness: Awake, asleep  AEDs during EEG study: Tax inspector aspects: This EEG study was done with scalp electrodes positioned according to the 10-20 International system of electrode placement. Electrical activity was acquired at a sampling rate of 500Hz  and reviewed with a high frequency filter of 70Hz  and a low frequency filter of 1Hz . EEG data were recorded continuously and digitally stored.   Description: The posterior dominant rhythm consists of 9 Hz activity of moderate voltage (25-35 uV) seen predominantly in posterior head regions, symmetric and reactive to eye opening and eye closing. Sleep was characterized by vertex waves, sleep spindles (12 to 14 Hz), maximal frontocentral region.  There is an excessive amount of 15 to 18 Hz  beta activity distributed symmetrically and diffusely.   Hyperventilation and photic stimulation were not performed.      ABNORMALITY -Excessive beta, generalized   IMPRESSION: This study is within normal limits. No seizures or epileptiform discharges were seen throughout the recording. The excessive beta activity seen in the background is most likely due to the effect of benzodiazepine and is a benign EEG pattern.     Sheray Grist Barbra Sarks

## 2022-01-24 NOTE — Progress Notes (Signed)
patient ID: Devin Garza, male   DOB: 1941-08-09, 81 y.o.   MRN: [id number]  PROGRESS NOTE    Devin Garza  DXI:[id number] DOB: 1941-01-31 DOA: 01/22/2022 PCP: Devin Garza, Devin Halsted, MD   Chief Complaint  Patient presents with   Fatigue    Brief Narrative:   Devin Garza is a 81 y.o. male with medical history significant of seizures, carotid artery disease, hyperlipidemia, GI bleed, gout, hypertension, anemia, basal cell carcinoma, GERD presenting with ongoing weakness and fatigue.  Patient states for last 7 to 10 days, he has felt generally weak with increased fatigue.  Also with decreased appetite and decreased p.o. intake for the past 4-5 days.  Denies any vomiting nor abdominal pain.  Reports some loose stools but denies any diarrhea nor blood.  Does report some chronic lower extremity edema.  His work-up was significant for severe dehydration, and multiple electrolyte derangements, and AKI/rhabdomyolysis, initially patient reports last alcohol drink was few weeks ago, but he did start to develop symptoms of alcohol withdrawal, then he endorses last drink was actually 6 days ago, and he is drinking 1 to 2 glasses of wine per day, and before that he was drinking 2 shots of whiskey nightly, he was started on CIWA protocol, 1/29 he did develop alcohol withdrawal seizures for which she required multiple dosing of Ativan and transferred to ICU.     Assessment & Plan:   Principal Problem:   Hypocalcemia Active Problems:   Mixed hyperlipidemia   Essential hypertension   Carotid artery stenosis   GERD without esophagitis   Hypokalemia   AKI (acute kidney injury) ([place])  Acute encephalopathy -This is multifactorial, he did develop alcohol withdrawal with DTs, started on CIWA protocol, but he did develop alcohol withdrawal seizures as well. -Texting his airway, but high risk for intubation.  Alcohol withdrawal with DTs -patient this morning is confused, restless, tachycardic with  tremors this morning, started on CIWA protocol with-Valium dosing - initially patient reports last alcohol drink was few weeks ago, but he did start to develop symptoms of alcohol withdrawal, then he endorses last drink was actually 6 days ago, and he is drinking 1 to 2 glasses of wine per day, and before that he was drinking 2 shots of whiskey nightly.  Seizures -Likely alcohol withdrawal seizures, quired multiple Ativan dosing, transferred to ICU for monitoring, neurology consulted, initial recommendation for 2 g of Keppra loading. -Continue with seizure precautions.  Diarrhea -Patient with watery diarrhea, C. difficile and GI panel is negative, will start on Imodium as needed -Hemoccult positive stool, but hemoglobin remained stable.  AKI/rhabdomyolysis -creatinine significantly elevated, baseline around 1, it is 2.65 on admission, most likely volume depletion/Prerenal due to diarrhea/and rhabdomyolysis -Continue with IV fluids, avoid nephrotoxic medications -Total CK significantly elevated 3000 range,  Hypocalcemia -Repleted  Severe hypokalemia -repleted  Severe hypomagnesemia -repleted  Failure to thrive/deconditioning/Multiple falls/Right ankle pain -This is most likely due to renal failure, dehydration, and multiple electrolytes abnormalities causing multiple falls, PT/OT is consulted -Right knee portable x-ray with no evidence of acute fracture.  Elevated BNP Elevated troponin - Patient noted to have borderline BNP at 108 and troponin at 154 on first check with repeat pending. - No chest pain or shortness of breath. - Has had some tachycardia this may represent demand ischemia. -  EKG without ischemic features -154Non-ACS pattern as troponin is stable>154 -As well troponin mainly elevated in the setting of AKI.  Urinary Retention - Started on Flomax, will request daily as  staff were  unable to do any and out due to resistance.  Hyperlipidemia Carotid artery  disease - Continue home atorvastatin   History of GI bleed Anemia > Hemoglobin stable at 11  Hypertension -Blood pressure is elevated, continue with amlodipine and metoprolol, will add as needed hydralazine, will hold valsartan due to AKI.    GERD - Continue PPI  Pressure ulcers in Left upper extremity -Wound care consulted  DVT prophylaxis: SCD's Code Status: Full Family Communication: Son Devin Garza was updated by phone, and about his dad's transfer to ICU.  U Disposition:   Status is: Inpatient  Remains inpatient appropriate because:  transfer to ICU.   consults  Neuro PCCM  Subjective:  Patient with increased confusion, restlessness overnight, pulled his IV, he developed seizure this morning.  Objective: Vitals:   01/24/22 0000 01/24/22 0328 01/24/22 0744 01/24/22 0823  BP: (!) 122/58 124/73 (!) 152/66 (!) 142/55  Pulse: 90 85 (!) 161 97  Resp: 17 17 (!) 29   Temp: 98.4 F (36.9 C) 98.9 F (37.2 C) 98 F (36.7 C)   TempSrc: Axillary Axillary Axillary   SpO2: 98% 98% 98%   Weight:      Height:        Intake/Output Summary (Last 24 hours) at 01/24/2022 1103 Last data filed at 01/24/2022 1027 Gross per 24 hour  Intake 104.97 ml  Output 1325 ml  Net -1220.03 ml   Filed Weights   01/22/22 2034  Weight: 74.8 kg    Examination:  Awake confused, restless this morning, with significant tremors, when seen earlier today, he did develop seizures later in the day.  Symmetrical Chest wall movement, Good air movement bilaterally, CTAB RRR,No Gallops,Rubs or new Murmurs, No Parasternal Heave +ve B.Sounds, Abd Soft, No tenderness, No rebound - guarding or rigidity. No Cyanosis, Clubbing or edema, No new Rash or bruise   .       Data Reviewed: I have personally reviewed following labs and imaging studies  CBC: Recent Labs  Lab 01/22/22 2213 01/23/22 0437 01/23/22 1651 01/24/22 0229  WBC 8.6 7.9 8.5 7.4  NEUTROABS 5.9  --   --   --   HGB 11.0* 10.4*  10.9* 10.3*  HCT 32.4* 30.4* 31.0* 29.1*  MCV 92.3 93.0 91.4 91.5  PLT 243 213 216 756    Basic Metabolic Panel: Recent Labs  Lab 01/23/22 0000 01/23/22 0437 01/23/22 0930 01/23/22 1651 01/24/22 0229  NA 139 139 137 137 136  K 2.8* 3.3* 3.8 3.6 4.4  CL 106 109 109 109 107  CO2 13* 11* 11* 13* 13*  GLUCOSE 108* 121* 117* 158* 141*  BUN 40* 38* 41* 35* 29*  CREATININE 2.48* 2.06* 1.75* 1.44* 1.20  CALCIUM 6.1* 5.9* 6.2* 7.0* 7.2*  MG <0.5*  --  1.0* 1.9 1.6*  PHOS 5.8*  --  3.5 2.8 2.5    GFR: Estimated Creatinine Clearance: 49.8 mL/min (by C-G formula based on SCr of 1.2 mg/dL).  Liver Function Tests: Recent Labs  Lab 01/22/22 2213  AST 67*  ALT 32  ALKPHOS 51  BILITOT 0.8  PROT 6.6  ALBUMIN 3.2*    CBG: Recent Labs  Lab 01/24/22 1056  GLUCAP 147*     Recent Results (from the past 240 hour(s))  Resp Panel by RT-PCR (Flu A&B, Covid) Nasopharyngeal Swab     Status: None   Collection Time: 01/22/22 10:13 PM   Specimen: Nasopharyngeal Swab; Nasopharyngeal(NP) swabs in vial transport medium  Result Value Ref Range  Status   SARS Coronavirus 2 by RT PCR NEGATIVE NEGATIVE Final    Comment: (NOTE) SARS-CoV-2 target nucleic acids are NOT DETECTED.  The SARS-CoV-2 RNA is generally detectable in upper respiratory specimens during the acute phase of infection. The lowest concentration of SARS-CoV-2 viral copies this assay can detect is 138 copies/mL. A negative result does not preclude SARS-Cov-2 infection and should not be used as the sole basis for treatment or other patient management decisions. A negative result may occur with  improper specimen collection/handling, submission of specimen other than nasopharyngeal swab, presence of viral mutation(s) within the areas targeted by this assay, and inadequate number of viral copies(<138 copies/mL). A negative result must be combined with clinical observations, patient history, and epidemiological information. The  expected result is Negative.  Fact Sheet for Patients:  EntrepreneurPulse.com.au  Fact Sheet for Healthcare Providers:  IncredibleEmployment.be  This test is no t yet approved or cleared by the [place] FDA and  has been authorized for detection and/or diagnosis of SARS-CoV-2 by FDA under an Emergency Use Authorization (EUA). This EUA will remain  in effect (meaning this test can be used) for the duration of the COVID-19 declaration under Section 564(b)(1) of the Act, 21 U.S.C.section 360bbb-3(b)(1), unless the authorization is terminated  or revoked sooner.       Influenza A by PCR NEGATIVE NEGATIVE Final   Influenza B by PCR NEGATIVE NEGATIVE Final    Comment: (NOTE) The Xpert Xpress SARS-CoV-2/FLU/RSV plus assay is intended as an aid in the diagnosis of influenza from Nasopharyngeal swab specimens and should not be used as a sole basis for treatment. Nasal washings and aspirates are unacceptable for Xpert Xpress SARS-CoV-2/FLU/RSV testing.  Fact Sheet for Patients: EntrepreneurPulse.com.au  Fact Sheet for Healthcare Providers: IncredibleEmployment.be  This test is not yet approved or cleared by the [place] FDA and has been authorized for detection and/or diagnosis of SARS-CoV-2 by FDA under an Emergency Use Authorization (EUA). This EUA will remain in effect (meaning this test can be used) for the duration of the COVID-19 declaration under Section 564(b)(1) of the Act, 21 U.S.C. section 360bbb-3(b)(1), unless the authorization is terminated or revoked.  Performed at [facility], [place] [address], [place], [place] [address]   C Difficile Quick Screen w PCR reflex     Status: None   Collection Time: 01/23/22 10:24 AM   Specimen: STOOL  Result Value Ref Range Status   C Diff antigen NEGATIVE NEGATIVE Final   C Diff toxin NEGATIVE NEGATIVE Final   C Diff interpretation No C.  difficile detected.  Final    Comment: Performed at [facility], [place] [address], [place], [place] [address]  Gastrointestinal Panel by PCR , Stool     Status: None   Collection Time: 01/23/22 10:24 AM   Specimen: STOOL  Result Value Ref Range Status   Campylobacter species NOT DETECTED NOT DETECTED Final   Plesimonas shigelloides NOT DETECTED NOT DETECTED Final   Salmonella species NOT DETECTED NOT DETECTED Final   Yersinia enterocolitica NOT DETECTED NOT DETECTED Final   Vibrio species NOT DETECTED NOT DETECTED Final   Vibrio cholerae NOT DETECTED NOT DETECTED Final   Enteroaggregative E coli (EAEC) NOT DETECTED NOT DETECTED Final   Enteropathogenic E coli (EPEC) NOT DETECTED NOT DETECTED Final   Enterotoxigenic E coli (ETEC) NOT DETECTED NOT DETECTED Final   Shiga like toxin producing E coli (STEC) NOT DETECTED NOT DETECTED Final   Shigella/Enteroinvasive E coli (EIEC) NOT  DETECTED NOT DETECTED Final   Cryptosporidium NOT DETECTED NOT DETECTED Final   Cyclospora cayetanensis NOT DETECTED NOT DETECTED Final   Entamoeba histolytica NOT DETECTED NOT DETECTED Final   Giardia lamblia NOT DETECTED NOT DETECTED Final   Adenovirus F40/41 NOT DETECTED NOT DETECTED Final   Astrovirus NOT DETECTED NOT DETECTED Final   Norovirus GI/GII NOT DETECTED NOT DETECTED Final   Rotavirus A NOT DETECTED NOT DETECTED Final   Sapovirus (I, II, IV, and V) NOT DETECTED NOT DETECTED Final    Comment: Performed at [facility], [address] [place], [place] [address]         Radiology Studies: CT Head Wo Contrast  Result Date: 01/22/2022 CLINICAL DATA:  Delirium EXAM: CT HEAD WITHOUT CONTRAST TECHNIQUE: Contiguous axial images were obtained from the base of the skull through the vertex without intravenous contrast. RADIATION DOSE REDUCTION: This exam was performed according to the departmental dose-optimization program which includes automated exposure control, adjustment of the mA  and/or kV according to patient size and/or use of iterative reconstruction technique. COMPARISON:  10/15/2020 FINDINGS: Brain: There is no mass, hemorrhage or extra-axial collection. There is generalized atrophy without lobar predilection. Hypodensity of the white matter is most commonly associated with chronic microvascular disease. Vascular: Atherosclerotic calcification of the vertebral and internal carotid arteries at the skull base. No abnormal hyperdensity of the major intracranial arteries or dural venous sinuses. Skull: The visualized skull base, calvarium and extracranial soft tissues are normal. Sinuses/Orbits: No fluid levels or advanced mucosal thickening of the visualized paranasal sinuses. No mastoid or middle ear effusion. The orbits are normal. IMPRESSION: 1. No acute intracranial abnormality. 2. Chronic microvascular ischemia and generalized atrophy. Electronically Signed   By: Ulyses Jarred M.D.   On: 01/22/2022 21:41   DG Chest Portable 1 View  Result Date: 01/22/2022 CLINICAL DATA:  Shortness of breath. EXAM: PORTABLE CHEST 1 VIEW COMPARISON:  October 15, 2020. FINDINGS: Stable cardiomediastinal silhouette. Both lungs are clear. The visualized skeletal structures are unremarkable. IMPRESSION: No active disease. Electronically Signed   By: Marijo Conception M.D.   On: 01/22/2022 21:21   DG Ankle Right Port  Result Date: 01/23/2022 CLINICAL DATA:  Right ankle pain EXAM: PORTABLE RIGHT ANKLE - 2 VIEW COMPARISON:  Right foot radiographs 01/04/2014 FINDINGS: There is a well corticated ossific fragment adjacent to the lateral malleolus likely reflecting sequela of prior trauma. There is marked narrowing of the tibiotalar joint space with associated subchondral sclerosis and osteophytosis. Irregularity of the talus may reflect sequela of prior trauma. There is soft tissue swelling about the ankle. IMPRESSION: 1. Marked narrowing of the tibiotalar joint space and irregularity of the talus may  reflect sequela of prior trauma and posttraumatic osteoarthritis. 2. Well corticated ossific fragment adjacent to the lateral malleolus suggesting sequela of prior trauma. 3. No definite evidence of acute fracture. 4. Soft tissue swelling about the ankle. Electronically Signed   By: Valetta Mole M.D.   On: 01/23/2022 10:58        Scheduled Meds:  amLODipine  10 mg Oral Daily   Chlorhexidine Gluconate Cloth  6 each Topical Daily   folic acid  1 mg Oral Daily   melatonin  5 mg Oral QHS   metoprolol succinate  100 mg Oral Daily   multivitamin with minerals  1 tablet Oral Daily   pantoprazole  40 mg Oral Daily   phosphorus  250 mg Oral TID   sodium bicarbonate  1,300 mg Oral QID  sodium chloride flush  3 mL Intravenous Q12H   tamsulosin  0.4 mg Oral QPC supper   [START ON 01/27/2022] thiamine  100 mg Oral Daily   Or   [START ON 01/27/2022] thiamine  100 mg Intravenous Daily   Continuous Infusions:  calcium gluconate 1,000 mg (01/24/22 1006)   magnesium sulfate bolus IVPB 4 g (01/24/22 1012)   sodium chloride     thiamine injection 500 mg (01/24/22 0959)     LOS: 1 day      Phillips Climes, MD [facility]   To contact the attending provider between 7A-7P or the covering provider during after hours 7P-7A, please log into the web site [email or web address] and access using universal [place] password for that web site. If you do not have the password, please call the hospital operator.  01/24/2022, 11:03 AM   Patient ID: Devin Garza, male   DOB: [birth year], 81 y.o.   MRN: [id number]

## 2022-01-24 NOTE — Progress Notes (Signed)
°  Transition of Care [facility]) Screening Note   Patient Details  Name: Devin Garza Date of Birth: 04/30/1941   Transition of Care [facility] CM/SW Contact:    Bartholomew Crews, RN Phone Number: [phone] 01/24/2022, 5:44 PM    Transition of Care Department [facility]) has reviewed patient and no TOC needs have been identified at this time. We will continue to monitor patient advancement through interdisciplinary progression rounds. If new patient transition needs arise, please place a TOC consult.  Noted patient is from [facility] - will follow for appropriate transition needs as patient progresses.

## 2022-01-25 ENCOUNTER — Inpatient Hospital Stay (HOSPITAL_COMMUNITY): Payer: Medicare Other

## 2022-01-25 DIAGNOSIS — E440 Moderate protein-calorie malnutrition: Secondary | ICD-10-CM | POA: Insufficient documentation

## 2022-01-25 DIAGNOSIS — E876 Hypokalemia: Secondary | ICD-10-CM | POA: Diagnosis not present

## 2022-01-25 DIAGNOSIS — R0609 Other forms of dyspnea: Secondary | ICD-10-CM

## 2022-01-25 DIAGNOSIS — N179 Acute kidney failure, unspecified: Secondary | ICD-10-CM | POA: Diagnosis not present

## 2022-01-25 LAB — ECHOCARDIOGRAM COMPLETE
AR max vel: 2.7 cm2
AV Area VTI: 2.49 cm2
AV Area mean vel: 2.52 cm2
AV Mean grad: 6 mmHg
AV Peak grad: 10.9 mmHg
Ao pk vel: 1.65 m/s
Area-P 1/2: 2.54 cm2
Height: 70 in
P 1/2 time: 548 msec
S' Lateral: 3.2 cm
Weight: 2624.36 oz

## 2022-01-25 LAB — GLUCOSE, CAPILLARY
Glucose-Capillary: 106 mg/dL — ABNORMAL HIGH (ref 70–99)
Glucose-Capillary: 157 mg/dL — ABNORMAL HIGH (ref 70–99)
Glucose-Capillary: 151 mg/dL — ABNORMAL HIGH (ref 70–99)
Glucose-Capillary: 117 mg/dL — ABNORMAL HIGH (ref 70–99)
Glucose-Capillary: 112 mg/dL — ABNORMAL HIGH (ref 70–99)

## 2022-01-25 LAB — BASIC METABOLIC PANEL
Anion gap: 14 (ref 5–15)
BUN: 19 mg/dL (ref 8–23)
CO2: 14 mmol/L — ABNORMAL LOW (ref 22–32)
Calcium: 6.9 mg/dL — ABNORMAL LOW (ref 8.9–10.3)
Chloride: 113 mmol/L — ABNORMAL HIGH (ref 98–111)
Creatinine, Ser: 1.05 mg/dL (ref 0.61–1.24)
GFR, Estimated: >60 mL/min (ref >60)
Glucose, Bld: 110 mg/dL — ABNORMAL HIGH (ref 70–99)
Potassium: 3.6 mmol/L (ref 3.5–5.1)
Sodium: 141 mmol/L (ref 135–145)

## 2022-01-25 LAB — MAGNESIUM: Magnesium: 1.7 mg/dL (ref 1.7–2.4)

## 2022-01-25 LAB — PHOSPHORUS: Phosphorus: 1.9 mg/dL — ABNORMAL LOW (ref 2.5–4.6)

## 2022-01-25 LAB — MRSA NEXT GEN BY PCR, NASAL: MRSA by PCR Next Gen: NOT DETECTED

## 2022-01-25 MED ORDER — CHLORDIAZEPOXIDE HCL 25 MG PO CAPS: 25.0000 mg | ORAL_CAPSULE | Freq: Two times a day (BID) | ORAL | Status: DC

## 2022-01-25 MED ORDER — CHLORHEXIDINE GLUCONATE 0.12 % MT SOLN
15.0000 mL | Freq: Two times a day (BID) | OROMUCOSAL | Status: DC
  Administered 2022-01-25 – 2022-01-28 (×7): 15 mL via OROMUCOSAL
  Filled 2022-01-25 (×5): qty 15

## 2022-01-25 MED ORDER — ORAL CARE MOUTH RINSE
15.0000 mL | Freq: Two times a day (BID) | OROMUCOSAL | Status: DC
  Administered 2022-01-26 – 2022-01-28 (×5): 15 mL via OROMUCOSAL

## 2022-01-25 MED ORDER — CHLORDIAZEPOXIDE HCL 25 MG PO CAPS
50.0000 mg | ORAL_CAPSULE | Freq: Three times a day (TID) | ORAL | Status: DC
  Administered 2022-01-25 – 2022-01-26 (×2): 50 mg via ORAL
  Filled 2022-01-25 (×3): qty 2

## 2022-01-25 MED ORDER — CHLORDIAZEPOXIDE HCL 25 MG PO CAPS: 25.0000 mg | ORAL_CAPSULE | Freq: Three times a day (TID) | ORAL | Status: DC

## 2022-01-25 MED ORDER — LACTATED RINGERS IV BOLUS
1000.0000 mL | Freq: Once | INTRAVENOUS | Status: AC
  Administered 2022-01-25: 1000 mL via INTRAVENOUS

## 2022-01-25 MED ORDER — MUPIROCIN 2 % EX OINT: 1.0000 "application " | TOPICAL_OINTMENT | Freq: Two times a day (BID) | CUTANEOUS | Status: DC

## 2022-01-25 MED ORDER — ENSURE ENLIVE PO LIQD
237.0000 mL | Freq: Two times a day (BID) | ORAL | Status: DC
  Administered 2022-01-26 – 2022-01-28 (×4): 237 mL via ORAL

## 2022-01-25 MED ORDER — MAGNESIUM SULFATE 2 GM/50ML IV SOLN
2.0000 g | Freq: Once | INTRAVENOUS | Status: AC
  Administered 2022-01-25: 2 g via INTRAVENOUS
  Filled 2022-01-25: qty 50

## 2022-01-25 MED ORDER — DEXTROSE 5 % IV SOLN
30.0000 mmol | Freq: Once | INTRAVENOUS | Status: AC
  Administered 2022-01-25: 30 mmol via INTRAVENOUS
  Filled 2022-01-25: qty 10

## 2022-01-25 MED ORDER — BETHANECHOL CHLORIDE 10 MG PO TABS
10.0000 mg | ORAL_TABLET | Freq: Three times a day (TID) | ORAL | Status: DC
  Administered 2022-01-25 – 2022-01-29 (×11): 10 mg via ORAL
  Filled 2022-01-25 (×14): qty 1

## 2022-01-25 MED ORDER — LACTATED RINGERS IV SOLN: INTRAVENOUS | Status: DC

## 2022-01-25 MED ORDER — CHLORDIAZEPOXIDE HCL 25 MG PO CAPS: 25.0000 mg | ORAL_CAPSULE | Freq: Every day | ORAL | Status: DC

## 2022-01-25 MED ORDER — CALCIUM GLUCONATE-NACL 2-0.675 GM/100ML-% IV SOLN
2.0000 g | Freq: Once | INTRAVENOUS | Status: AC
  Administered 2022-01-25: 2000 mg via INTRAVENOUS
  Filled 2022-01-25: qty 100

## 2022-01-25 NOTE — Evaluation (Signed)
Physical Therapy Evaluation Patient Details Name: Devin Garza MRN: [id number] DOB: [birth year] Today's Date: 01/25/2022  History of Present Illness  Devin Garza is a 81 y.o. male presented with ongoing weakness and fatigue. Found to have Hypocalcemia,Hypokalemia, and AKI/rhabdomyolysis. Developed alcohol withdrawal with DTs. 1/29 pt with generalized tonic clonic seizure that lasted for about 3 minutes (? ETOH withdrawl).  PHMx: seizures, carotid artery disease, hyperlipidemia, GI bleed, gout, hypertension, anemia, basal cell carcinoma, GERD  Clinical Impression  Pt presents to PT with decreased mobility due to decreased strength, decreased balance, decr cognition and decreased functional activity tolerance. Pt has no support at home so will likely need SNF at DC.         Recommendations for follow up therapy are one component of a multi-disciplinary discharge planning process, led by the attending physician.  Recommendations may be updated based on patient status, additional functional criteria and insurance authorization.  Follow Up Recommendations Skilled nursing-short term rehab (<3 hours/day)    Assistance Recommended at Discharge Frequent or constant Supervision/Assistance  Patient can return home with the following  Two people to help with walking and/or transfers;Two people to help with bathing/dressing/bathroom;Assist for transportation;Direct supervision/assist for medications management;Assistance with cooking/housework    Equipment Recommendations None recommended by PT  Recommendations for Other Services       Functional Status Assessment Patient has had a recent decline in their functional status and demonstrates the ability to make significant improvements in function in a reasonable and predictable amount of time.     Precautions / Restrictions Precautions Precautions: Fall Restrictions Weight Bearing Restrictions: No      Mobility  Bed Mobility Overal bed  mobility: Needs Assistance Bed Mobility: Rolling, Supine to Sit, Sit to Supine Rolling: Total assist, +2 for physical assistance   Supine to sit: Total assist, +2 for physical assistance Sit to supine: Total assist, +2 for physical assistance   General bed mobility comments: Assist for all aspects    Transfers                        Ambulation/Gait                  Stairs            Wheelchair Mobility    Modified Rankin (Stroke Patients Only)       Balance Overall balance assessment: Needs assistance Sitting-balance support: Bilateral upper extremity supported, Feet supported Sitting balance-Leahy Scale: Zero Sitting balance - Comments: total A +2 with pt leaning posteriorly and legs extending Postural control: Posterior lean                                   Pertinent Vitals/Pain Pain Assessment Pain Assessment: Faces Faces Pain Scale: Hurts little more Pain Location: "all over" Pain Descriptors / Indicators: Grimacing, Guarding, Moaning    Home Living Family/patient expects to be discharged to:: Private residence Living Arrangements: Alone Available Help at Discharge: Available PRN/intermittently Type of Home: Independent living facility Home Access: Level entry       Home Layout: One level Home Equipment: Conservation officer, nature (2 wheels);Cane - single point Additional Comments: Some of above information taken from prior chart due to pt's current decreased cognitive status    Prior Function Prior Level of Function : Independent/Modified Independent;Driving;History of Falls (last six months)             Mobility Comments:  reports he does not use an AD for ambulation       Hand Dominance   Dominant Hand: Right    Extremity/Trunk Assessment   Upper Extremity Assessment Upper Extremity Assessment: Defer to OT evaluation    Lower Extremity Assessment Lower Extremity Assessment: Generalized weakness        Communication   Communication: Expressive difficulties (mummbles--reports he has dentures, but they are not currently in)  Cognition Arousal/Alertness: Lethargic, Suspect due to medications Behavior During Therapy: Anxious, Restless Overall Cognitive Status: No family/caregiver present to determine baseline cognitive functioning Area of Impairment: Orientation, Attention, Following commands, Safety/judgement, Awareness, Problem solving                 Orientation Level: Disoriented to, Place, Situation Current Attention Level: Focused   Following Commands: Follows one step commands inconsistently, Follows one step commands with increased time Safety/Judgement: Decreased awareness of safety, Decreased awareness of deficits Awareness: Intellectual Problem Solving: Slow processing, Decreased initiation, Difficulty sequencing, Requires verbal cues, Requires tactile cues General Comments: Initially not oriented to place, at end of session oriented to place, year and month with increased time        General Comments      Exercises     Assessment/Plan    PT Assessment Patient needs continued PT services  PT Problem List Decreased strength;Decreased balance;Decreased cognition;Decreased mobility;Decreased activity tolerance       PT Treatment Interventions DME instruction;Functional mobility training;Balance training;Gait training;Therapeutic activities;Therapeutic exercise    PT Goals (Current goals can be found in the Care Plan section)  Acute Rehab PT Goals Patient Stated Goal: Unable to state PT Goal Formulation: Patient unable to participate in goal setting Time For Goal Achievement: 02/08/22 Potential to Achieve Goals: Fair    Frequency Min 3X/week     Co-evaluation PT/OT/SLP Co-Evaluation/Treatment: Yes Reason for Co-Treatment: Necessary to address cognition/behavior during functional activity;For patient/therapist safety PT goals addressed during session:  Mobility/safety with mobility;Balance OT goals addressed during session: Strengthening/ROM       AM-PAC PT "6 Clicks" Mobility  Outcome Measure Help needed turning from your back to your side while in a flat bed without using bedrails?: Total Help needed moving from lying on your back to sitting on the side of a flat bed without using bedrails?: Total Help needed moving to and from a bed to a chair (including a wheelchair)?: Total Help needed standing up from a chair using your arms (e.g., wheelchair or bedside chair)?: Total Help needed to walk in hospital room?: Total Help needed climbing 3-5 steps with a railing? : Total 6 Click Score: 6    End of Session Equipment Utilized During Treatment: Oxygen Activity Tolerance: Patient limited by lethargy Patient left: in bed;with call bell/phone within reach;with bed alarm set Nurse Communication: Mobility status PT Visit Diagnosis: Other abnormalities of gait and mobility (R26.89);History of falling (Z91.81);Difficulty in walking, not elsewhere classified (R26.2);Muscle weakness (generalized) (M62.81)    Time: 2778-2423 PT Time Calculation (min) (ACUTE ONLY): 26 min   Charges:   PT Evaluation $PT Eval Moderate Complexity: 1 [place] Pager [phone] Office [place] 01/25/2022, 9:51 AM

## 2022-01-25 NOTE — Progress Notes (Signed)
NAME:  Devin Garza, MRN:  [id number], DOB:  Nov 05, 1941, LOS: 2 ADMISSION DATE:  01/22/2022, CONSULTATION DATE:  01/24/2022 REFERRING MD:  Dr. Walker Shadow, CHIEF COMPLAINT:  ETOH withdrawal seizure    History of Present Illness:  Devin Garza is a 81 y.o. male with a PMH significant for 2 basal cell carcinoma, hypertension, hyperlipidemia, CAD, tobacco abuse, EtOH abuse, and anemia who presented to the ER 1/27 with complaints of fatigue, shortness of breath, and diarrhea that began 7 to 10 days prior to admission.  Patient also reported decreased oral intake but denies vomiting or diarrhea.  Patient was admitted per [facility] for treatment of multiple metabolic derangements including hypokalemia, hypomagnesemia hypokalemia glycemia hypocalcemia.   Mid morning of 1/29 patient had witnessed tonic-clonic seizure lasting approximately 3 minutes.  Seizure activity felt secondary to EtOH withdrawal.  He received 2 mg oral Ativan, 2 mg IM Ativan, and later 2 mg of IV Ativan with eventual cessation of seizure activity.  Post seizure patient seen post ictal with tachypnea currently protecting airway.  PCCM consulted for further management and transfer to ICU.  Neurology also consulted.  Pertinent  Medical History  Basal cell carcinoma GERD Hyperlipidemia Hypertension Osteoarthritis PVD CAD Anemia  Significant Hospital Events: Including procedures, antibiotic start and stop dates in addition to other pertinent events   1/27 admitted for failure to thrive and multiple metabolic derangements 2/63 patient had 3-minute tonic-clonic seizure felt secondary to EtOH withdrawal  Interim History / Subjective:  Patient required Precedex infusion overnight, this morning it was turned off Patient started waking up, following simple commands   Objective   Blood pressure 122/88, pulse 78, temperature 98.9 F (37.2 C), temperature source Axillary, resp. rate (!) 35, height 5\' 10"  (1.778 m), weight 74.4 kg, SpO2 95  %.        Intake/Output Summary (Last 24 hours) at 01/25/2022 1705 Last data filed at 01/25/2022 1400 Gross per 24 hour  Intake 3121.26 ml  Output 650 ml  Net 2471.26 ml   Filed Weights   01/22/22 2034 01/24/22 1200 01/25/22 0342  Weight: 74.8 kg 74.9 kg 74.4 kg    Examination: General: Acute on chronic ill-appearing elderly male lying in bed HEENT: [place]/AT, MM pink/moist, PERRL,  Neuro: Sleepy, opens eyes with vocal stimuli, following simple commands CV: s1s2 regular rate and rhythm, no murmur, rubs, or gallops,  PULM: Tachypneic, oxygen saturations mid 90s on room air, no increased work of breathing, no added breath sounds GI: soft, bowel sounds active in all 4 quadrants, non-tender, non-distended Extremities: warm/dry, no edema  Skin: no rashes or lesions  Resolved Hospital Problem list     Assessment & Plan:  Tonic-clonic alcohol withdrawal seizure Patient had 1 episode of seizure yesterday, received 2 mg of IV Ativan in the setting of alcohol withdrawal He remained postictal for few hours after that he became agitated requiring Precedex infusion No more seizures on EEG Patient was loaded with Keppra Continue Keppra Continue CIWA scale Started on Librium protocol Will try to keep Precedex off Continue thiamine and folate  Hypokalemia/hypophosphatemia/Hypocalcemia/Hypomagnesemia Continue aggressive electrolyte supplement and monitor  Acute Kidney Injury -prerenal due to dehydration Acute rhabdomyolysis Secondary to likely dehydration in the setting of significant GI losses Serum creatinine is improving Continue IV fluid Monitor urine output Trend Bmet Avoid nephrotoxins, Ensure adequate renal perfusion   Hypertension/hyperlipidemia Resume Norvasc, Lipitor and metoprolol  Acute urinary retention Continue Foley catheter Started on bethanechol Voiding trial tomorrow  History of GERD Continue Protonix  Pressure ulcers,  unstageable, POA Continue local wound  care Optimize nutrition  Dysphagia with high risk of aspiration Evaluated by speech and swallow, recommend nectar thick dysphagia diet  Best Practice (right click and "Reselect all SmartList Selections" daily)   Diet/type: Dysphagia diet DVT prophylaxis: SCD GI prophylaxis: PPI Lines: N/A Foley:  Yes, and it is still needed Code Status:  full code Last date of multidisciplinary goals of care discussion: Pending   Labs   CBC: Recent Labs  Lab 01/22/22 2213 01/23/22 0437 01/23/22 1651 01/24/22 0229  WBC 8.6 7.9 8.5 7.4  NEUTROABS 5.9  --   --   --   HGB 11.0* 10.4* 10.9* 10.3*  HCT 32.4* 30.4* 31.0* 29.1*  MCV 92.3 93.0 91.4 91.5  PLT 243 213 216 841    Basic Metabolic Panel: Recent Labs  Lab 01/23/22 0000 01/23/22 0437 01/23/22 0930 01/23/22 1651 01/24/22 0229 01/24/22 1421 01/25/22 0021  NA 139   < > 137 137 136 139 141  K 2.8*   < > 3.8 3.6 4.4 3.6 3.6  CL 106   < > 109 109 107 109 113*  CO2 13*   < > 11* 13* 13* 16* 14*  GLUCOSE 108*   < > 117* 158* 141* 148* 110*  BUN 40*   < > 41* 35* 29* 22 19  CREATININE 2.48*   < > 1.75* 1.44* 1.20 1.14 1.05  CALCIUM 6.1*   < > 6.2* 7.0* 7.2* 7.3* 6.9*  MG <0.5*  --  1.0* 1.9 1.6* 2.5* 1.7  PHOS 5.8*  --  3.5 2.8 2.5  --  1.9*   < > = values in this interval not displayed.   GFR: Estimated Creatinine Clearance: 57 mL/min (by C-G formula based on SCr of 1.05 mg/dL). Recent Labs  Lab 01/22/22 2213 01/23/22 0437 01/23/22 1651 01/24/22 0229  WBC 8.6 7.9 8.5 7.4    Liver Function Tests: Recent Labs  Lab 01/22/22 2213  AST 67*  ALT 32  ALKPHOS 51  BILITOT 0.8  PROT 6.6  ALBUMIN 3.2*   No results for input(s): LIPASE, AMYLASE in the last 168 hours. No results for input(s): AMMONIA in the last 168 hours.  ABG    Component Value Date/Time   HCO3 23.0 10/16/2020 2258   TCO2 21 (L) 10/15/2020 2211   ACIDBASEDEF 0.5 10/16/2020 2258   O2SAT 35.3 10/16/2020 2258     Coagulation Profile: No results for  input(s): INR, PROTIME in the last 168 hours.  Cardiac Enzymes: Recent Labs  Lab 01/23/22 1651  CKTOTAL 2,944*    HbA1C: Hgb A1c MFr Bld  Date/Time Value Ref Range Status  10/16/2020 02:51 AM 5.7 (H) 4.8 - 5.6 % Final    Comment:    (NOTE)         Prediabetes: 5.7 - 6.4         Diabetes: >6.4         Glycemic control for adults with diabetes: <7.0   02/25/2020 12:52 PM 5.2 4.6 - 6.5 % Final    Comment:    Glycemic Control Guidelines for People with Diabetes:Non Diabetic:  <6%Goal of Therapy: <7%Additional Action Suggested:  >8%     CBG: Recent Labs  Lab 01/24/22 2326 01/25/22 0335 01/25/22 0816 01/25/22 1159 01/25/22 1611  GLUCAP 99 106* 157* 151* 117*   Critical care time:     Total critical care time: 37 minutes  Performed by: [place] care time was exclusive of separately billable procedures  and treating other patients.   Critical care was necessary to treat or prevent imminent or life-threatening deterioration.   Critical care was time spent personally by me on the following activities: development of treatment plan with patient and/or surrogate as well as nursing, discussions with consultants, evaluation of patient's response to treatment, examination of patient, obtaining history from patient or surrogate, ordering and performing treatments and interventions, ordering and review of laboratory studies, ordering and review of radiographic studies, pulse oximetry and re-evaluation of patient's condition.   Jacky Kindle MD [facility] See Amion for pager If no response to pager, please call [phone] until 7pm After 7pm, Please call E-link [phone]

## 2022-01-25 NOTE — Evaluation (Signed)
Clinical/Bedside Swallow Evaluation Patient Details  Name: Devin Garza MRN: [id number] Date of Birth: 1941-06-13  Today's Date: 01/25/2022 Time: SLP Start Time (ACUTE ONLY): 1407 SLP Stop Time (ACUTE ONLY): 1427 SLP Time Calculation (min) (ACUTE ONLY): 20 min  Past Medical History:  Past Medical History:  Diagnosis Date   Actinic keratosis    Anemia    Basal cell adenocarcinoma 09/03/2008   Qualifier: Diagnosis of  By: Lorelei Pont MD, Frederico Hamman    Overview:  Overview:  Qualifier: Diagnosis of  By: Copland MD, [place]    Basal cell carcinoma 11/06/2008   Right mastoid/neck.    Basal cell carcinoma 11/13/2008   Left lateral nose medial infraorbital ~2cm lat. to med. canthus. Excised 01/08/2009, margins free.   Basal cell carcinoma 10/09/2009   Right upper back, paraspinal inf. to base of neck. Excised 12/03/2009   Basal cell carcinoma 06/11/2013   Left lateral forehead. Excised 07/18/2013, margins free.   Basal cell carcinoma 07/30/2015   Right nasal ala. Nodular pattern.   Basal cell carcinoma 07/30/2015   Right mid to sup. helix. Nodular.   Basal cell carcinoma 08/25/2016   Right mid to sup. helix. Superficial with focal infiltration. Excised 09/28/2016, margins free.   Basal cell carcinoma 03/16/2017   Right mid to sup. helix. Mixed pattern, ulcerated   Basal cell carcinoma 05/10/2018   Right nasal tip. BCC with focal sclerosis.   Basal cell carcinoma of lip 2007   multiple sites   Blood transfusion without reported diagnosis    ED (erectile dysfunction)    GERD (gastroesophageal reflux disease)    HLD (hyperlipidemia)    Hx of squamous cell carcinoma of skin 08/25/2016   L superior pectoral   Hypertension    Osteoarthritis    Peripheral vascular disease ([place])    Occlusion and Stenosis of Carotid Artery   Squamous cell carcinoma of skin 08/25/2016   Left superior pectoral. KA pattern. EDC.   Tobacco abuse    Tubular adenoma of colon 2005   Wears dentures    full upper and  lower   Past Surgical History:  Past Surgical History:  Procedure Laterality Date   ANKLE ARTHROSCOPY W/ OPEN REPAIR Left    COLONOSCOPY  2006   [place]-Tubular adenoma   COLONOSCOPY N/A 07/23/2015   Procedure: COLONOSCOPY;  Surgeon: Lucilla Lame, MD;  Location: [place];  Service: Gastroenterology;  Laterality: N/A;  WITH BIOPSIES---- TRANSVERSE COLON POLYP DESCENDING COLON POLYP  X  4   ESOPHAGOGASTRODUODENOSCOPY N/A 07/23/2015   Procedure: ESOPHAGOGASTRODUODENOSCOPY (EGD);  Surgeon: Lucilla Lame, MD;  Location: [facility];  Service: Gastroenterology;  Laterality: N/A;  WITH BIOPSY--- DUODENAL BIOPSY GASTRIC BIOPSY   ESOPHAGOGASTRODUODENOSCOPY N/A 10/28/2016   Procedure: ESOPHAGOGASTRODUODENOSCOPY (EGD);  Surgeon: Manya Silvas, MD;  [facility] ENDOSCOPY;  Service: Endoscopy;  Laterality: N/A;   ESOPHAGOGASTRODUODENOSCOPY (EGD) WITH PROPOFOL N/A 09/02/2017   Procedure: ESOPHAGOGASTRODUODENOSCOPY (EGD) WITH PROPOFOL;  Surgeon: Manya Silvas, MD;  Location: [facility] ENDOSCOPY;  Service: Endoscopy;  Laterality: N/A;   SKIN CANCER EXCISION     LIP   TONSILLECTOMY     age 81   UPPER GI ENDOSCOPY  12/2009   Dr. Ivory Broad gastritisl negative for H. pylori; duodenal biopsies neg for sprue   HPI:  81 y.o. male presented with ongoing weakness and fatigue. Found to have Hypocalcemia,Hypokalemia, and AKI/rhabdomyolysis. Developed alcohol withdrawal with DTs. 1/29 pt with generalized tonic clonic seizure that lasted for about 3 minutes (? ETOH withdrawl). SInce admission has been coughing intermittently with  POs.  PHMx:  mild Schatzki ring (acquired) was found in the distal esophagus per endoscopy 2018; seizures, carotid artery disease, hyperlipidemia, GI bleed, gout, hypertension, anemia, basal cell carcinoma, GERD    Assessment / Plan / Recommendation  Clinical Impression  Pt presents with s/s of dysphagia with marked though intermittent coughing associated with swallowing  thin liquids.  Purees and nectars were tolerated without incident.  Oral mechanism exam was normal; pt is edentulous (dentures are here but he needs adhesive). Pt demonstrates good oral control, palpable swallow.  Concern is for dyssynchrony of swallow and respiratory cycles.  His RR ranged between 29-39; WOB was increased.  In the presence of tachypnea, it can be difficult to achieve laryngeal closure for the duration of time necessary for the liquid bolus to safely bypass the larynx.  Also notable is pt's hx of Schatzki's ring and last dilatation in 2018.  He may benefit from another EGD at some point in the future.  Given clinical improvement when drinking nectars, recommend dysphagia 2, nectar liquids for today. SLP will f/u next date to determine readiness to advance vs need for instrumental study. [name] was willing to try nectars for the short term. SLP Visit Diagnosis: Dysphagia, unspecified (R13.10)           Diet Recommendation   Dysphagia 2, nectar thick liquids  Medication Administration: Whole meds with puree    Other  Recommendations Oral Care Recommendations: Oral care BID    Recommendations for follow up therapy are one component of a multi-disciplinary discharge planning process, led by the attending physician.  Recommendations may be updated based on patient status, additional functional criteria and insurance authorization.  Follow up Recommendations Other (comment) (tba)              Frequency and Duration min 2x/week  2 weeks       Prognosis Prognosis for Safe Diet Advancement: Good      Swallow Study   General HPI: 81 y.o. male presented with ongoing weakness and fatigue. Found to have Hypocalcemia,Hypokalemia, and AKI/rhabdomyolysis. Developed alcohol withdrawal with DTs. 1/29 pt with generalized tonic clonic seizure that lasted for about 3 minutes (? ETOH withdrawl). SInce admission has been coughing intermittently with POs.  PHMx:  mild Schatzki ring  (acquired) was found in the distal esophagus per endoscopy 2018; seizures, carotid artery disease, hyperlipidemia, GI bleed, gout, hypertension, anemia, basal cell carcinoma, GERD Type of Study: Bedside Swallow Evaluation Previous Swallow Assessment: no Diet Prior to this Study: Dysphagia 2 (chopped);Thin liquids Temperature Spikes Noted: No Respiratory Status: Nasal cannula History of Recent Intubation: No Behavior/Cognition: Alert;Cooperative Oral Cavity Assessment: Within Functional Limits Oral Care Completed by SLP: Recent completion by staff Oral Cavity - Dentition: Edentulous (needs denture adhesive) Vision: Functional for self-feeding Self-Feeding Abilities: Needs assist Patient Positioning: Upright in bed Baseline Vocal Quality: Wet Volitional Cough: Strong Volitional Swallow: Able to elicit    Oral/Motor/Sensory Function Overall Oral Motor/Sensory Function: Within functional limits   Ice Chips Ice chips: Within functional limits   Thin Liquid Thin Liquid: Impaired Presentation: Cup Pharyngeal  Phase Impairments: Cough - Immediate    Nectar Thick Nectar Thick Liquid: Within functional limits Presentation: Cup   Honey Thick Honey Thick Liquid: Not tested   Puree Puree: Within functional limits   Solid     Solid: Not tested      Devin Garza 01/25/2022,2:56 PM  Estill Bamberg L. Tivis Ringer, [place] [facility] [place] Office number [phone] Pager [phone]

## 2022-01-25 NOTE — Progress Notes (Signed)
[facility] ADULT ICU REPLACEMENT PROTOCOL   The patient does apply for the [facility] Adult ICU Electrolyte Replacment Protocol based on the criteria listed below:   1.Exclusion criteria: TCTS patients, ECMO patients, and Dialysis patients 2. Is GFR >/= 30 ml/min? Yes.    Patient's GFR today is >60 3. Is SCr </= 2? Yes.   Patient's SCr is 1.05 mg/dL 4. Did SCr increase >/= 0.5 in 24 hours? No. 5.Pt's weight >40kg  Yes.   6. Abnormal electrolyte(s): Phos Mag  7. Electrolytes replaced per protocol 8.  Call MD STAT for K+ </= 2.5, Phos </= 1, or Mag </= 1 Physician:  Elisabeth Cara [facility] 01/25/2022 5:37 AM

## 2022-01-25 NOTE — TOC Progression Note (Signed)
Transition of Care [facility]) - Initial/Assessment Note    Patient Details  Name: Devin Garza MRN: [id number] Date of Birth: 11-Aug-1941  Transition of Care [facility]) CM/SW Contact:    Milinda Antis, [place] Phone Number: 01/25/2022, 3:00 PM  Clinical Narrative:                 CSW received consult for SA.  Per RN, patient is only oriented to self at this time.  CSW contacted [facility] where the patient resides.  The patient was in the independent living section of the facility and does not provide Skilled Nursing.  The patient could return with a Banker.          Patient Goals and CMS Choice        Expected Discharge Plan and Services                                                Prior Living Arrangements/Services                       Activities of Daily Living Home Assistive Devices/Equipment: Grab bars around toilet, Walker (specify type), Cane (specify quad or straight) ADL Screening (condition at time of admission) Patient's cognitive ability adequate to safely complete daily activities?: No Is the patient deaf or have difficulty hearing?: No Does the patient have difficulty seeing, even when wearing glasses/contacts?: Yes Does the patient have difficulty concentrating, remembering, or making decisions?: No Patient able to express need for assistance with ADLs?: Yes Does the patient have difficulty dressing or bathing?: No Independently performs ADLs?: Yes (appropriate for developmental age) (per pt report) Does the patient have difficulty walking or climbing stairs?: Yes Weakness of Legs: Right Weakness of Arms/Hands: Right (R shoulder per pt and tremors)  Permission Sought/Granted                  Emotional Assessment              Admission diagnosis:  Hypocalcemia [E83.51] Dehydration [E86.0] Hypokalemia [E87.6] Poor appetite [R63.0] Heme positive stool [R19.5] Elevated troponin [R77.8] Generalized weakness  [R53.1] AKI (acute kidney injury) ([place]) [N17.9] Diarrhea, unspecified type [R19.7] Patient Active Problem List   Diagnosis Date Noted   Hypocalcemia 01/22/2022   AKI (acute kidney injury) ([place]) 01/22/2022   Fall 10/16/2020   Pressure injury of skin 10/16/2020   Seizure ([place]) 10/15/2020   Hypokalemia 10/15/2020   Hypomagnesemia 10/15/2020   Hyperglycemia 10/15/2020   Right hip pain 10/15/2020   Acute urinary retention 10/15/2020   History of alcohol abuse 10/15/2020   Gout 03/21/2017   History of upper gastrointestinal bleeding 11/10/2016   Angiodysplasia of stomach and duodenum without hemorrhage    Benign neoplasm of transverse colon    Benign neoplasm of descending colon    GERD without esophagitis 05/28/2015   Tubular adenoma of colon 05/28/2015   Prediabetes 04/21/2011   Alcohol abuse 01/21/2011   Anemia, unspecified 11/12/2009   Basal cell carcinoma of skin 09/03/2008   Unspecified malignant neoplasm of other specified sites of skin 09/03/2008   Carotid artery stenosis 08/28/2008   Insomnia 08/28/2008   Occlusion and stenosis of carotid artery 08/28/2008   Mixed hyperlipidemia 08/27/2008   Essential hypertension 08/27/2008   PCP:  Isaac Bliss, Rayford Halsted, MD Pharmacy:   [place], [place] [address] [place] [address] Phone: [phone] Fax: [phone]  [facility] [place], [place] [address] Phone: [phone] Fax: [phone]  [id number] - [place], [place] AT [address] [place]. [place] [address] Phone: [phone] Fax: [phone]  [place], [place] [address] Phone: [phone] Fax: [phone]     Social Determinants of Health (SDOH) Interventions     Readmission Risk Interventions Readmission Risk Prevention Plan 10/21/2020  Transportation Screening Complete  PCP or Specialist Appt within 3-5 Days Complete  HRI or [place] Complete  Social Work Consult for [place] Planning/Counseling Complete  Palliative Care Screening Not Applicable  Medication Review Press photographer) Complete  Some recent data might be hidden

## 2022-01-25 NOTE — Plan of Care (Signed)

## 2022-01-25 NOTE — Progress Notes (Addendum)
Neurology Progress Note  S: Patient is still not entirely awake. He is speaking to NP but it is garbled. He is following some commands.    O: Current vital signs: BP (!) 103/54    Pulse (!) 54    Temp (!) 97.3 F (36.3 C) (Oral)    Resp (!) 25    Ht 5\' 10"  (1.778 m)    Wt 74.4 kg    SpO2 99%    BMI 23.53 kg/m  Vital signs in last 24 hours: Temp:  [97.1 F (36.2 C)-98.5 F (36.9 C)] 97.3 F (36.3 C) (01/30 1100) Pulse Rate:  [53-95] 54 (01/30 1000) Resp:  [14-40] 25 (01/30 1000) BP: (93-148)/(46-135) 103/54 (01/30 1000) SpO2:  [88 %-100 %] 99 % (01/30 1000) Weight:  [74.4 kg] 74.4 kg (01/30 0342)  GENERAL: Acute on chronically ill appearing. Wakes up but does not keep eyes open. NAD. HEENT: Normocephalic and atraumatic. LUNGS: Normal respiratory effort.  CV: RRR on tele.  Ext: warm.  NEURO:  Mental Status: Easily aroused to loud voice. Opens his eyes but immediately closes them and says he is tired. He can tell NP his name, city, and state. Not oriented otherwise.  Speech/Language: soft, garbled, but not truly dysarthria. Will not name or repeat. His comprehension is slowed but follows commands.   Cranial Nerves:  Smile is symmetrical at rest. Hearing intact to loud clapping. Palate elevates symmetrically. Tongue is midline without fasciculations.  He grips with hands, but only softly, but he has mittens on. He is able to lift his legs off bed and plantar/dorsiflexion are 5/5.  Tone is normal and bulk is normal. Can not grade sensory due to inability to answer consistently. Unable to perform HKS.  Gait is deferred due to bedrest.   Medications  Current Facility-Administered Medications:    acetaminophen (TYLENOL) tablet 650 mg, 650 mg, Oral, Q6H PRN, 650 mg at 01/24/22 0823 **OR** acetaminophen (TYLENOL) suppository 650 mg, 650 mg, Rectal, Q6H PRN, Marcelyn Bruins, MD   amLODipine (NORVASC) tablet 10 mg, 10 mg, Oral, Daily, Marcelyn Bruins, MD, 10 mg at 01/24/22 8768    bethanechol (URECHOLINE) tablet 10 mg, 10 mg, Oral, TID, Jacky Kindle, MD   calcium gluconate 2 g/ 100 mL sodium chloride IVPB, 2 g, Intravenous, Once, Jacky Kindle, MD, Last Rate: 100 mL/hr at 01/25/22 1108, 2,000 mg at 01/25/22 1108   chlordiazePOXIDE (LIBRIUM) capsule 50 mg, 50 mg, Oral, Q8H **FOLLOWED BY** [START ON 01/27/2022] chlordiazePOXIDE (LIBRIUM) capsule 25 mg, 25 mg, Oral, Q8H **FOLLOWED BY** [START ON 01/29/2022] chlordiazePOXIDE (LIBRIUM) capsule 25 mg, 25 mg, Oral, Q12H **FOLLOWED BY** [START ON 01/31/2022] chlordiazePOXIDE (LIBRIUM) capsule 25 mg, 25 mg, Oral, Daily, Chand, Sudham, MD   chlorhexidine (PERIDEX) 0.12 % solution 15 mL, 15 mL, Mouth Rinse, BID, Kara Mead V, MD, 15 mL at 01/25/22 0243   Chlorhexidine Gluconate Cloth 2 % PADS 6 each, 6 each, Topical, Daily, Elgergawy, Silver Huguenin, MD, 6 each at 01/24/22 1103   dexmedetomidine (PRECEDEX) 400 MCG/100ML (4 mcg/mL) infusion, 0.4-1.2 mcg/kg/hr, Intravenous, Titrated, Kamat, Sunil G, MD, Stopped at 01/25/22 0801   feeding supplement (ENSURE ENLIVE / ENSURE PLUS) liquid 237 mL, 237 mL, Oral, BID BM, Chand, Sudham, MD   folic acid (FOLVITE) tablet 1 mg, 1 mg, Oral, Daily, Elgergawy, Silver Huguenin, MD, 1 mg at 01/24/22 0829   HYDROmorphone (DILAUDID) injection 0.5 mg, 0.5 mg, Intravenous, Q3H PRN, Chotiner, Yevonne Aline, MD   lactated ringers infusion, , Intravenous, Continuous, Jacky Kindle, MD, Last  Rate: 75 mL/hr at 01/25/22 1000, Infusion Verify at 01/25/22 1000   levETIRAcetam (KEPPRA) IVPB 500 mg/100 mL premix, 500 mg, Intravenous, Q12H, Amie Portland, MD, Last Rate: 400 mL/hr at 01/25/22 1046, 500 mg at 01/25/22 1046   loperamide (IMODIUM) capsule 4 mg, 4 mg, Oral, PRN, Elgergawy, Silver Huguenin, MD   LORazepam (ATIVAN) tablet 1-4 mg, 1-4 mg, Oral, Q1H PRN, 2 mg at 01/24/22 1610 **OR** LORazepam (ATIVAN) injection 1-4 mg, 1-4 mg, Intravenous, Q1H PRN, Elgergawy, Silver Huguenin, MD, 2 mg at 01/24/22 2312   MEDLINE mouth rinse, 15 mL, Mouth Rinse,  q12n4p, Kara Mead V, MD   melatonin tablet 5 mg, 5 mg, Oral, QHS, Elgergawy, Silver Huguenin, MD, 5 mg at 01/23/22 2019   metoprolol succinate (TOPROL-XL) 24 hr tablet 100 mg, 100 mg, Oral, Daily, Marcelyn Bruins, MD, 100 mg at 01/24/22 9604   multivitamin with minerals tablet 1 tablet, 1 tablet, Oral, Daily, Elgergawy, Silver Huguenin, MD, 1 tablet at 01/24/22 0829   pantoprazole (PROTONIX) injection 40 mg, 40 mg, Intravenous, Q12H, Harris, Whitney D, NP, 40 mg at 01/24/22 2103   phosphorus (K PHOS NEUTRAL) tablet 250 mg, 250 mg, Oral, TID, Elgergawy, Silver Huguenin, MD, 250 mg at 01/24/22 5409   potassium PHOSPHATE 30 mmol in dextrose 5 % 500 mL infusion, 30 mmol, Intravenous, Once, Kamat, Sunil G, MD, Last Rate: 85 mL/hr at 01/25/22 1000, Infusion Verify at 01/25/22 1000   sodium bicarbonate tablet 1,300 mg, 1,300 mg, Oral, QID, Elgergawy, Silver Huguenin, MD, 1,300 mg at 01/24/22 8119   sodium chloride flush (NS) 0.9 % injection 3 mL, 3 mL, Intravenous, Q12H, Marcelyn Bruins, MD, 3 mL at 01/24/22 2104   tamsulosin (FLOMAX) capsule 0.4 mg, 0.4 mg, Oral, QPC supper, Elgergawy, Silver Huguenin, MD, 0.4 mg at 01/23/22 1022   [START ON 01/27/2022] thiamine tablet 100 mg, 100 mg, Oral, Daily **OR** [START ON 01/27/2022] thiamine (B-1) injection 100 mg, 100 mg, Intravenous, Daily, Elgergawy, Silver Huguenin, MD   thiamine 500mg  in normal saline (65ml) IVPB, 500 mg, Intravenous, Q8H, Elgergawy, Silver Huguenin, MD, Stopped at 01/25/22 0540   traZODone (DESYREL) tablet 50 mg, 50 mg, Oral, QHS PRN, Chotiner, Yevonne Aline, MD, 50 mg at 01/23/22 2258  Pertinent Labs Vitamin B12 normal an Vit D normal.   Imaging MD has reviewed images in epic and the results pertinent to this consultation are:  CT Head No acute abnormality.   MRI Brain  Have not been able to obtain as of yet.   Assessment: 81 yo male who presented AMS thinking it was ETOH withdrawal. He had a 3 minute GTC seizure on the floor which aborted with Ativan 2mg . He was given a load  of Keppra and started on Keppra due to history of seizure in 2020 combined with unknown # of seizures over years and suspicion for ETOH related withdrawal seizures. No seizure activity noted on EEG. MRI is pending. He remains drowsy on exam today, but perhaps a small improvement compared to prior exam.   Recommendations/Plan:  -Continue Keppra 500mg  IV q12 hours.  -MRI brain when able. We will f/up.  -continue seizure precautions.  -Precedex for ETOH withdrawal per PCCM.    Pt seen by Clance Boll, MSN, APN-BC/Nurse Practitioner/Neuro and later by MD. Note and plan to be edited as needed by MD.  Pager: [phone]  The patient was seen and examined with Clance Boll, NP. The chart and diagnostic studies were reviewed, discussed assessment and agree with plan as plan as outlined  in the note above.   Electronically signed by:  Lynnae Sandhoff, MD Page: [phone] 01/28/2022, 4:09 PM

## 2022-01-25 NOTE — Evaluation (Signed)
Occupational Therapy Evaluation Patient Details Name: Devin Garza MRN: [id number] DOB: 11-30-1941 Today's Date: 01/25/2022   History of Present Illness Constant [place] is a 81 y.o. male presented with ongoing weakness and fatigue. Found to have Hypocalcemia,Hypokalemia, and AKI. Developed alcohol withdrawal with DTs. 1/29 pt with generalized tonic clonic seizure that lasted for about 3 minutes (? ETOH withdrawl).  PHMx: seizures, carotid artery disease, hyperlipidemia, GI bleed, gout, hypertension, anemia, basal cell carcinoma, GERD   Clinical Impression   This 81 yo male admitted with above presents to acute OT with PLOF per his report of being independent with basic ADLs, driving, and ambulation without use of AD at ILF--pt currently withdrawing from ETOH so not sure of accuracy. He currently is total A -total A +2 for all basic ADLs and mobility with keeping his eyes closed 95% of session. He will continue to benefit from acute OT with follow up at SNF.     Recommendations for follow up therapy are one component of a multi-disciplinary discharge planning process, led by the attending physician.  Recommendations may be updated based on patient status, additional functional criteria and insurance authorization.   Follow Up Recommendations  Skilled nursing-short term rehab (<3 hours/day)    Assistance Recommended at Discharge Frequent or constant Supervision/Assistance  Patient can return home with the following Two people to help with bathing/dressing/bathroom;Two people to help with walking and/or transfers;Assistance with feeding;Assistance with cooking/housework;Assist for transportation;Direct supervision/assist for financial management;Direct supervision/assist for medications management    Functional Status Assessment  Patient has had a recent decline in their functional status and demonstrates the ability to make significant improvements in function in a reasonable and predictable amount  of time.  Equipment Recommendations  None recommended by OT       Precautions / Restrictions Precautions Precautions: Fall Restrictions Weight Bearing Restrictions: No      Mobility Bed Mobility Overal bed mobility: Needs Assistance Bed Mobility: Rolling, Supine to Sit, Sit to Supine Rolling: Total assist, +2 for physical assistance   Supine to sit: Total assist, +2 for physical assistance Sit to supine: Total assist, +2 for physical assistance            Balance Overall balance assessment: Needs assistance Sitting-balance support: Bilateral upper extremity supported, Feet supported Sitting balance-Leahy Scale: Zero Sitting balance - Comments: total A +2 with pt leaning posteriorly and legs extending                                   ADL either performed or assessed with clinical judgement   ADL Overall ADL's : Needs assistance/impaired                                       General ADL Comments: total A     Vision Baseline Vision/History: 1 Wears glasses Ability to See in Adequate Light: 0 Adequate Additional Comments: Kept eyes closed 95% of session (only opened them intermittenlty while seatd EOB)            Pertinent Vitals/Pain Pain Assessment Pain Assessment: Faces Pain Location: "all over" Pain Descriptors / Indicators: Grimacing, Guarding, Moaning Pain Intervention(s): Limited activity within patient's tolerance, Monitored during session, Repositioned     Hand Dominance Right   Extremity/Trunk Assessment Upper Extremity Assessment Upper Extremity Assessment: Generalized weakness   Lower Extremity Assessment Lower Extremity Assessment:  Generalized weakness       Communication Communication Communication: Expressive difficulties (mummbles--reports he has dentures, but they are not currently in)   Cognition Arousal/Alertness: Lethargic, Suspect due to medications (and ETOH WD) Behavior During Therapy: Anxious,  Restless Overall Cognitive Status: No family/caregiver present to determine baseline cognitive functioning Area of Impairment: Orientation, Attention, Following commands, Safety/judgement, Awareness, Problem solving                   Current Attention Level: Focused   Following Commands: Follows one step commands inconsistently, Follows one step commands with increased time Safety/Judgement: Decreased awareness of safety, Decreased awareness of deficits Awareness: Intellectual Problem Solving: Slow processing, Decreased initiation, Difficulty sequencing, Requires verbal cues, Requires tactile cues General Comments: Initially not oriented to place, at end of session oriented to place, year and month with increased time                Home Living Family/patient expects to be discharged to:: Private residence Living Arrangements: Alone Available Help at Discharge: Available PRN/intermittently Type of Home: Independent living facility Home Access: Level entry     Home Layout: One level     Bathroom Shower/Tub: Occupational psychologist: Standard     Home Equipment: Conservation officer, nature (2 wheels);Cane - single point   Additional Comments: Some of above information taken from prior chart due to pt's current decreased cognitive status      Prior Functioning/Environment Prior Level of Function : Independent/Modified Independent;Driving;History of Falls (last six months)             Mobility Comments: reports he does not use an AD for ambulation          OT Problem List: Impaired balance (sitting and/or standing);Decreased cognition;Decreased safety awareness;Pain      OT Treatment/Interventions: Self-care/ADL training;DME and/or AE instruction;Patient/family education;Balance training    OT Goals(Current goals can be found in the care plan section) Acute Rehab OT Goals Patient Stated Goal: to lay back down OT Goal Formulation: With patient Time For Goal  Achievement: 02/08/22 Potential to Achieve Goals: Good  OT Frequency: Min 2X/week    Co-evaluation PT/OT/SLP Co-Evaluation/Treatment: Yes Reason for Co-Treatment: For patient/therapist safety;Necessary to address cognition/behavior during functional activity PT goals addressed during session: Mobility/safety with mobility;Balance;Strengthening/ROM OT goals addressed during session: Strengthening/ROM      AM-PAC OT "6 Clicks" Daily Activity     Outcome Measure Help from another person eating meals?: Total Help from another person taking care of personal grooming?: Total Help from another person toileting, which includes using toliet, bedpan, or urinal?: Total Help from another person bathing (including washing, rinsing, drying)?: Total Help from another person to put on and taking off regular upper body clothing?: Total Help from another person to put on and taking off regular lower body clothing?: Total 6 Click Score: 6   End of Session Nurse Communication: Mobility status (cogntive status)  Activity Tolerance: Patient tolerated treatment well Patient left: in bed;with bed alarm set;with restraints reapplied (Bil hand mits')  OT Visit Diagnosis: Other abnormalities of gait and mobility (R26.89);Repeated falls (R29.6);History of falling (Z91.81);Pain;Other symptoms and signs involving cognitive function Pain - part of body:  (all over)                Time: 6237-6283 OT Time Calculation (min): 27 min Charges:  OT General Charges $OT Visit: 1 Visit OT Evaluation $OT Eval Moderate Complexity: [place], OTR/L Acute [facility] [phone] Office [phone]  Almon Register 01/25/2022, 9:39 AM

## 2022-01-25 NOTE — Progress Notes (Signed)
Echocardiogram 2D Echocardiogram has been performed.  Devin Garza 01/25/2022, 10:14 AM

## 2022-01-25 NOTE — Progress Notes (Signed)
Patient ID: Devin Garza, male   DOB: 1941-03-20, 81 y.o.   MRN: [id number] Patient is transferred to ICU, currently on Precedex Drip, under ICU care, please Notify [facility] hospitalist to resume care when patient is stable to transfer out of ICU. Phillips Climes MD

## 2022-01-25 NOTE — Progress Notes (Addendum)
Initial Nutrition Assessment  DOCUMENTATION CODES:  Non-severe (moderate) malnutrition in context of social or environmental circumstances  INTERVENTION:  Continue current diet as ordered, encourage PO intake If cough is persistent, pt may benefit from an SLP evaluation Consider cortrak tube placement if pt agreeable and intake is poor given malnutrition Ensure Enlive po BID, each supplement provides 350 kcal and 20 grams of protein Continue current vitamin regimen Pt is a refeeding risk, monitor potassium, magnesium, and phosphorus, replace as needed  NUTRITION DIAGNOSIS:  Moderate Malnutrition (in the context of social/environmental circumstances) related to  (lack of nutrient dense foods due to EtOH abuse) as evidenced by mild fat depletion, moderate muscle depletion.  GOAL:  Provide needs based on ASPEN/SCCM guidelines  MONITOR:  PO intake, Skin, Weight trends, Supplement acceptance  REASON FOR ASSESSMENT:  Malnutrition Screening Tool    ASSESSMENT:  81 y.o. male with history of seizures, CAD, PVD, HLD, gout, HTN, EtOH abuse, and GERD presented to ED with ongoing weakness, fatigue, and poor PO for the past week. Found to have significant electrolyte abnormalities and and AKI on admission  After admission 1/28, pt had increased confusion which was attributed to withdrawal and then experienced a tonic-clonic seizure 1/29. Moved to ICU for closer monitoring.  Discussed with RN, precedex was paused this AM to determine if pt would be alert enough for PO intake. Initially, pt failed bedside swallow due to coughing. Cortrak tube placement was recommended, but pt adamantly refused. After refusing cortrak, passed repeat test.   Pt resting in bed at the time of assessment. Slightly confused during conversation, but able to provide some hx. Pt reports that he normally eats 2-3x/d at his ALF. Eats cereal and fruit for breakfast, a sandwich for lunch, and a larger meal for dinner (hamburger,  steak, etc). Pt wear dentures at baseline, not currently in place. Pt reports that they do not fit well.   Reviewed wt hx, 7.5% weight loss noted in the last 6 months (7/22-1/30) which is not severe but concerning for age and hx of EtOH abuse.   Discussed nutrition supplements to help in meeting nutrition needs, pt agreeable to ensure and prefers chocolate.    Intake/Output Summary (Last 24 hours) at 01/25/2022 1245 Last data filed at 01/25/2022 1200 Gross per 24 hour  Intake 2687.5 ml  Output 1250 ml  Net 1437.5 ml  Net IO Since Admission: 999.25 mL [01/25/22 1245]  Nutritionally Relevant Medications: Scheduled Meds:  folic acid  1 mg Oral Daily   multivitamin with minerals  1 tablet Oral Daily   pantoprazole IV  40 mg Intravenous Q12H   phosphorus  250 mg Oral TID   sodium bicarbonate  1,300 mg Oral QID   Continuous Infusions:  calcium gluconate     dexmedetomidine (PRECEDEX) IV infusion 0.5 mcg/kg/hr (01/25/22 0801)   lactated ringers 75 mL/hr at 01/25/22 0835   levETIRAcetam Stopped (01/24/22 2118)   potassium PHOSPHATE IVPB (in mmol) 85 mL/hr at 01/25/22 0800   thiamine injection Stopped (01/25/22 0540)   Labs Reviewed: CCM Electrolyte replacement protocol in place Chloride 113 Calcium corrects to 7.54mg /dL for low albumin Phosphorus 1.9   NUTRITION - FOCUSED PHYSICAL EXAM: Flowsheet Row Most Recent Value  Orbital Region Mild depletion  Upper Arm Region Moderate depletion  Thoracic and Lumbar Region Mild depletion  Buccal Region Mild depletion  Temple Region Mild depletion  Clavicle Bone Region Moderate depletion  Clavicle and Acromion Bone Region Moderate depletion  Scapular Bone Region Moderate depletion  Dorsal  Hand Unable to assess  [mittens]  Patellar Region Moderate depletion  Anterior Thigh Region Severe depletion  Posterior Calf Region Moderate depletion  Edema (RD Assessment) Mild  Hair Reviewed  Eyes Reviewed  Mouth Reviewed  Skin Reviewed  [dry,  bruising]  Nails Unable to assess  [mittens]   Diet Order:   Diet Order             DIET DYS 2 Room service appropriate? Yes; Fluid consistency: Thin  Diet effective now                   EDUCATION NEEDS:  Education needs have been addressed  Skin:  Skin Assessment: Reviewed RN Assessment (abrasions/scabs to the left arm and leg, stage 1 pressure injury to the sacrum)  Last BM:  1/29 - type 7  Height:  Ht Readings from Last 1 Encounters:  01/22/22 5\' 10"  (1.778 m)    Weight:  Wt Readings from Last 1 Encounters:  01/25/22 74.4 kg    Ideal Body Weight:  75.5 kg  BMI:  Body mass index is 23.53 kg/m.  Estimated Nutritional Needs:  Kcal:  1850-2050 kcal/d Protein:  80-90 g/d Fluid:  >2 L/d   Ranell Patrick, RD, LDN Clinical Dietitian RD pager # available in [place]  After hours/weekend pager # available in [facility]

## 2022-01-25 NOTE — Consult Note (Signed)
[facility] Nurse wound consult note Consultation was completed by review of records, images and assistance from the bedside nurse/clinical staff.  Reason for Consult: left arm wound Wound type: non pressure wound, partial thickness Pressure Injury POA: NA Measurement: see nursing flow sheet Wound bed: scabbed, with some thicker scabbed material along the proximal wound edge Drainage (amount, consistency, odor) none Periwound: intact  Dressing procedure/placement/frequency:  Single layer of xeroform over the arm wound, top with foam. Change every 3 days and PRN.    Re consult if needed, will not follow at this time. Thanks  [place] President [employer], RN,CWOCN, CNS, [place] [phone])

## 2022-01-26 DIAGNOSIS — N179 Acute kidney failure, unspecified: Secondary | ICD-10-CM | POA: Diagnosis not present

## 2022-01-26 DIAGNOSIS — R410 Disorientation, unspecified: Secondary | ICD-10-CM

## 2022-01-26 LAB — PHOSPHORUS: Phosphorus: 2.5 mg/dL (ref 2.5–4.6)

## 2022-01-26 LAB — POCT I-STAT 7, (LYTES, BLD GAS, ICA,H+H)
Acid-base deficit: 2 mmol/L (ref 0.0–2.0)
Bicarbonate: 21.3 mmol/L (ref 20.0–28.0)
Calcium, Ion: 1.04 mmol/L — ABNORMAL LOW (ref 1.15–1.40)
HCT: 22 % — ABNORMAL LOW (ref 39.0–52.0)
Hemoglobin: 7.5 g/dL — ABNORMAL LOW (ref 13.0–17.0)
O2 Saturation: 97 %
Patient temperature: 98.1
Potassium: 4.2 mmol/L (ref 3.5–5.1)
Sodium: 137 mmol/L (ref 135–145)
TCO2: 22 mmol/L (ref 22–32)
pCO2 arterial: 28.8 mmHg — ABNORMAL LOW (ref 32.0–48.0)
pH, Arterial: 7.475 — ABNORMAL HIGH (ref 7.350–7.450)
pO2, Arterial: 78 mmHg — ABNORMAL LOW (ref 83.0–108.0)

## 2022-01-26 LAB — CBC WITH DIFFERENTIAL/PLATELET
Abs Immature Granulocytes: 0.03 10*3/uL (ref 0.00–0.07)
Basophils Absolute: 0 10*3/uL (ref 0.0–0.1)
Basophils Relative: 1 %
Eosinophils Absolute: 0.1 10*3/uL (ref 0.0–0.5)
Eosinophils Relative: 2 %
HCT: 24.7 % — ABNORMAL LOW (ref 39.0–52.0)
Hemoglobin: 8.7 g/dL — ABNORMAL LOW (ref 13.0–17.0)
Immature Granulocytes: 1 %
Lymphocytes Relative: 17 %
Lymphs Abs: 1 10*3/uL (ref 0.7–4.0)
MCH: 32.5 pg (ref 26.0–34.0)
MCHC: 35.2 g/dL (ref 30.0–36.0)
MCV: 92.2 fL (ref 80.0–100.0)
Monocytes Absolute: 0.8 10*3/uL (ref 0.1–1.0)
Monocytes Relative: 14 %
Neutro Abs: 3.7 10*3/uL (ref 1.7–7.7)
Neutrophils Relative %: 65 %
Platelets: 226 10*3/uL (ref 150–400)
RBC: 2.68 MIL/uL — ABNORMAL LOW (ref 4.22–5.81)
RDW: 12.3 % (ref 11.5–15.5)
WBC: 5.7 10*3/uL (ref 4.0–10.5)
nRBC: 0 % (ref 0.0–0.2)

## 2022-01-26 LAB — BASIC METABOLIC PANEL
Anion gap: 10 (ref 5–15)
BUN: 14 mg/dL (ref 8–23)
CO2: 18 mmol/L — ABNORMAL LOW (ref 22–32)
Calcium: 7.1 mg/dL — ABNORMAL LOW (ref 8.9–10.3)
Chloride: 108 mmol/L (ref 98–111)
Creatinine, Ser: 0.78 mg/dL (ref 0.61–1.24)
GFR, Estimated: >60 mL/min (ref >60)
Glucose, Bld: 140 mg/dL — ABNORMAL HIGH (ref 70–99)
Potassium: 3.4 mmol/L — ABNORMAL LOW (ref 3.5–5.1)
Sodium: 136 mmol/L (ref 135–145)

## 2022-01-26 LAB — MAGNESIUM
Magnesium: 1.2 mg/dL — ABNORMAL LOW (ref 1.7–2.4)
Magnesium: 2.2 mg/dL (ref 1.7–2.4)

## 2022-01-26 LAB — CK: Total CK: 327 U/L (ref 49–397)

## 2022-01-26 LAB — GLUCOSE, CAPILLARY: Glucose-Capillary: 136 mg/dL — ABNORMAL HIGH (ref 70–99)

## 2022-01-26 MED ORDER — PHENOBARBITAL SODIUM 130 MG/ML IJ SOLN
130.0000 mg | Freq: Once | INTRAMUSCULAR | Status: AC
  Administered 2022-01-26: 130 mg via INTRAVENOUS
  Filled 2022-01-26: qty 1

## 2022-01-26 MED ORDER — PHENOBARBITAL 32.4 MG PO TABS
97.2000 mg | ORAL_TABLET | Freq: Three times a day (TID) | ORAL | Status: DC
  Administered 2022-01-26 – 2022-01-27 (×3): 97.2 mg via ORAL
  Filled 2022-01-26 (×3): qty 3

## 2022-01-26 MED ORDER — POTASSIUM CHLORIDE CRYS ER 20 MEQ PO TBCR
40.0000 meq | EXTENDED_RELEASE_TABLET | Freq: Once | ORAL | Status: AC
  Administered 2022-01-26: 40 meq via ORAL
  Filled 2022-01-26: qty 2

## 2022-01-26 MED ORDER — DEXMEDETOMIDINE HCL IN NACL 400 MCG/100ML IV SOLN
0.2000 ug/kg/h | INTRAVENOUS | Status: DC
  Administered 2022-01-26 – 2022-01-27 (×4): 0.5 ug/kg/h via INTRAVENOUS
  Administered 2022-01-27: 0.25 ug/kg/h via INTRAVENOUS
  Filled 2022-01-26 (×4): qty 100

## 2022-01-26 MED ORDER — PHENOBARBITAL 32.4 MG PO TABS: 64.8000 mg | ORAL_TABLET | Freq: Three times a day (TID) | ORAL | Status: DC

## 2022-01-26 MED ORDER — MAGNESIUM SULFATE 2 GM/50ML IV SOLN
2.0000 g | Freq: Once | INTRAVENOUS | Status: AC
  Administered 2022-01-26: 2 g via INTRAVENOUS
  Filled 2022-01-26: qty 50

## 2022-01-26 MED ORDER — PANTOPRAZOLE SODIUM 40 MG PO TBEC
40.0000 mg | DELAYED_RELEASE_TABLET | Freq: Two times a day (BID) | ORAL | Status: DC
  Administered 2022-01-26: 40 mg via ORAL
  Filled 2022-01-26 (×2): qty 1

## 2022-01-26 MED ORDER — PHENOBARBITAL 32.4 MG PO TABS
16.2000 mg | ORAL_TABLET | Freq: Three times a day (TID) | ORAL | Status: DC
Start: 2022-02-01 — End: 2022-01-27

## 2022-01-26 MED ORDER — LEVETIRACETAM 500 MG PO TABS
500.0000 mg | ORAL_TABLET | Freq: Two times a day (BID) | ORAL | Status: DC
  Administered 2022-01-26 (×2): 500 mg via ORAL
  Filled 2022-01-26 (×3): qty 1

## 2022-01-26 MED ORDER — MAGNESIUM SULFATE 4 GM/100ML IV SOLN
4.0000 g | Freq: Once | INTRAVENOUS | Status: AC
  Administered 2022-01-26: 4 g via INTRAVENOUS
  Filled 2022-01-26: qty 100

## 2022-01-26 MED ORDER — PHENOBARBITAL 32.4 MG PO TABS: 32.4000 mg | ORAL_TABLET | Freq: Three times a day (TID) | ORAL | Status: DC

## 2022-01-26 NOTE — Progress Notes (Signed)
SLP Cancellation Note  Patient Details Name: Devin Garza MRN: [id number] DOB: 02/28/1941   Cancelled treatment:       Reason Eval/Treat Not Completed: Fatigue/lethargy limiting ability to participate;Patient's level of consciousness. SLP to continue to follow patient for PO toleration and ability to upgrade solid and liquid consistencies.  Sonia Baller, MA, CCC-SLP Speech Therapy

## 2022-01-26 NOTE — Progress Notes (Signed)
Center Progress Note Patient Name: Devin Garza DOB: 14-Jan-1941 MRN: [id number]   Date of Service  01/26/2022  HPI/Events of Note  Patient became lethargic on a Precedex IV infusion at 0.8 mcg/kg/hour. Precedex decreased and patient is more alert.   eICU Interventions  Plan: Change Precedex dosing to 0.2-0.5 mcg/kg/hour. Titrate to RASS = 0.      Intervention Category Major Interventions: Change in mental status - evaluation and management  Devin Garza [name] 01/26/2022, 11:49 PM

## 2022-01-26 NOTE — Progress Notes (Signed)
NAME:  Devin Garza, MRN:  [id number], DOB:  05-11-1941, LOS: 3 ADMISSION DATE:  01/22/2022, CONSULTATION DATE:  01/24/2022 REFERRING MD:  Dr. Walker Shadow, CHIEF COMPLAINT:  ETOH withdrawal seizure    History of Present Illness:  Devin Garza is a 81 y.o. male with a PMH significant for 2 basal cell carcinoma, hypertension, hyperlipidemia, CAD, tobacco abuse, EtOH abuse, and anemia who presented to the ER 1/27 with complaints of fatigue, shortness of breath, and diarrhea that began 7 to 10 days prior to admission.  Patient also reported decreased oral intake but denies vomiting or diarrhea.  Patient was admitted per [facility] for treatment of multiple metabolic derangements including hypokalemia, hypomagnesemia hypokalemia glycemia hypocalcemia.   Mid morning of 1/29 patient had witnessed tonic-clonic seizure lasting approximately 3 minutes.  Seizure activity felt secondary to EtOH withdrawal.  He received 2 mg oral Ativan, 2 mg IM Ativan, and later 2 mg of IV Ativan with eventual cessation of seizure activity.  Post seizure patient seen post ictal with tachypnea currently protecting airway.  PCCM consulted for further management and transfer to ICU.  Neurology also consulted.  Pertinent  Medical History  Basal cell carcinoma GERD Hyperlipidemia Hypertension Osteoarthritis PVD CAD Anemia  Significant Hospital Events: Including procedures, antibiotic start and stop dates in addition to other pertinent events   1/27 admitted for failure to thrive and multiple metabolic derangements 5/36 patient had 3-minute tonic-clonic seizure felt secondary to EtOH withdrawal  Interim History / Subjective:  Remains off Precedex gtt. Started on Librium Taper 1/30, required multiple doses of ativan overnight for continued agitation    Objective   Blood pressure (!) 134/93, pulse (!) 113, temperature 97.9 F (36.6 C), temperature source Oral, resp. rate (!) 28, height 5\' 10"  (1.778 m), weight 78.3 kg, SpO2 92  %.        Intake/Output Summary (Last 24 hours) at 01/26/2022 0755 Last data filed at 01/26/2022 0400 Gross per 24 hour  Intake 4468.35 ml  Output 875 ml  Net 3593.35 ml   Filed Weights   01/24/22 1200 01/25/22 0342 01/26/22 0630  Weight: 74.9 kg 74.4 kg 78.3 kg    Examination: General: Acute on chronic ill-appearing elderly male lying in bed HEENT: Dry MM  Neuro: Alert, follows commands, pupils intact and reactive  CV: s1s2 regular rate and rhythm, no murmur, rubs, or gallops,  PULM: Mild use of accessory muscles, clear breath sounds  GI: soft, bowel sounds active in all 4 quadrants, non-tender, non-distended, foley in place  Extremities: warm/dry, no edema  Skin: no rashes or lesions  Resolved Hospital Problem list     Assessment & Plan:   Tonic-clonic alcohol withdrawal seizure Patient had 1 episode of seizure 1/29 received 2 mg of IV Ativan in the setting of alcohol withdrawal He remained postictal for few hours after that he became agitated requiring Precedex infusion No more seizures on EEG Patient was loaded with Keppra Plan Continue Keppra Continue CIWA scale Started on Librium protocol 1/30. Has required multiple doses of ativan overnight, will give phenobarbital IV and if patient responds switch to PO phenobarbital taper  Continue thiamine and folate  Hypokalemia/hypophosphatemia/Hypocalcemia/Hypomagnesemia Acute Kidney Injury -prerenal due to dehydration Acute rhabdomyolysis Secondary to likely dehydration in the setting of significant GI losses Serum creatinine is improving Plan Continue IV fluid Monitor urine output Trend Bmet Avoid nephrotoxins, Ensure adequate renal perfusion   Hypertension Hyperlipidemia Plan Continue Norvasc and metoprolol Continue Lipitor   Acute urinary retention Plan Continue Foley catheter Continue bethanechol  Continue Flomax  Voiding trial tomorrow (only received one dose of bethanechol)    History of  GERD Continue Protonix  Pressure ulcers, unstageable, POA Continue local wound care Optimize nutrition  Dysphagia with high risk of aspiration Evaluated by speech and swallow, continue nectar thick dysphagia diet  Best Practice (right click and "Reselect all SmartList Selections" daily)   Diet/type: Dysphagia diet DVT prophylaxis: SCD GI prophylaxis: PPI Foley:  Yes, and it is still needed. Urinary Retention  Code Status:  full code Last date of multidisciplinary goals of care discussion: Pending   Labs   CBC: Recent Labs  Lab 01/22/22 2213 01/23/22 0437 01/23/22 1651 01/24/22 0229 01/26/22 0237  WBC 8.6 7.9 8.5 7.4 5.7  NEUTROABS 5.9  --   --   --  3.7  HGB 11.0* 10.4* 10.9* 10.3* 8.7*  HCT 32.4* 30.4* 31.0* 29.1* 24.7*  MCV 92.3 93.0 91.4 91.5 92.2  PLT 243 213 216 223 329    Basic Metabolic Panel: Recent Labs  Lab 01/23/22 0930 01/23/22 1651 01/24/22 0229 01/24/22 1421 01/25/22 0021 01/26/22 0237  NA 137 137 136 139 141 136  K 3.8 3.6 4.4 3.6 3.6 3.4*  CL 109 109 107 109 113* 108  CO2 11* 13* 13* 16* 14* 18*  GLUCOSE 117* 158* 141* 148* 110* 140*  BUN 41* 35* 29* 22 19 14   CREATININE 1.75* 1.44* 1.20 1.14 1.05 0.78  CALCIUM 6.2* 7.0* 7.2* 7.3* 6.9* 7.1*  MG 1.0* 1.9 1.6* 2.5* 1.7 1.2*  PHOS 3.5 2.8 2.5  --  1.9* 2.5   GFR: Estimated Creatinine Clearance: 74.8 mL/min (by C-G formula based on SCr of 0.78 mg/dL). Recent Labs  Lab 01/23/22 0437 01/23/22 1651 01/24/22 0229 01/26/22 0237  WBC 7.9 8.5 7.4 5.7    Liver Function Tests: Recent Labs  Lab 01/22/22 2213  AST 67*  ALT 32  ALKPHOS 51  BILITOT 0.8  PROT 6.6  ALBUMIN 3.2*   No results for input(s): LIPASE, AMYLASE in the last 168 hours. No results for input(s): AMMONIA in the last 168 hours.  ABG    Component Value Date/Time   HCO3 23.0 10/16/2020 2258   TCO2 21 (L) 10/15/2020 2211   ACIDBASEDEF 0.5 10/16/2020 2258   O2SAT 35.3 10/16/2020 2258     Coagulation Profile: No  results for input(s): INR, PROTIME in the last 168 hours.  Cardiac Enzymes: Recent Labs  Lab 01/23/22 1651  CKTOTAL 2,944*    HbA1C: Hgb A1c MFr Bld  Date/Time Value Ref Range Status  10/16/2020 02:51 AM 5.7 (H) 4.8 - 5.6 % Final    Comment:    (NOTE)         Prediabetes: 5.7 - 6.4         Diabetes: >6.4         Glycemic control for adults with diabetes: <7.0   02/25/2020 12:52 PM 5.2 4.6 - 6.5 % Final    Comment:    Glycemic Control Guidelines for People with Diabetes:Non Diabetic:  <6%Goal of Therapy: <7%Additional Action Suggested:  >8%     CBG: Recent Labs  Lab 01/25/22 0335 01/25/22 0816 01/25/22 1159 01/25/22 1611 01/25/22 2147  GLUCAP 106* 157* 151* 117* 112*   Critical care time:     Total critical care time: 32 minutes  Critical care time was exclusive of separately billable procedures and treating other patients.   Critical care was necessary to treat or prevent imminent or life-threatening deterioration.   Critical care was time spent  personally by me on the following activities: development of treatment plan with patient and/or surrogate as well as nursing, discussions with consultants, evaluation of patient's response to treatment, examination of patient, obtaining history from patient or surrogate, ordering and performing treatments and interventions, ordering and review of laboratory studies, ordering and review of radiographic studies, pulse oximetry and re-evaluation of patient's condition.   Hayden Pedro, AGACNP-BC [facility]  PCCM Pgr: [phone]

## 2022-01-27 ENCOUNTER — Inpatient Hospital Stay (HOSPITAL_COMMUNITY): Payer: Medicare Other

## 2022-01-27 DIAGNOSIS — J9601 Acute respiratory failure with hypoxia: Secondary | ICD-10-CM | POA: Diagnosis not present

## 2022-01-27 DIAGNOSIS — R410 Disorientation, unspecified: Secondary | ICD-10-CM | POA: Diagnosis not present

## 2022-01-27 LAB — CBC
HCT: 25.4 % — ABNORMAL LOW (ref 39.0–52.0)
Hemoglobin: 8.7 g/dL — ABNORMAL LOW (ref 13.0–17.0)
MCH: 31.8 pg (ref 26.0–34.0)
MCHC: 34.3 g/dL (ref 30.0–36.0)
MCV: 92.7 fL (ref 80.0–100.0)
Platelets: 227 10*3/uL (ref 150–400)
RBC: 2.74 MIL/uL — ABNORMAL LOW (ref 4.22–5.81)
RDW: 12.2 % (ref 11.5–15.5)
WBC: 6.2 10*3/uL (ref 4.0–10.5)
nRBC: 0 % (ref 0.0–0.2)

## 2022-01-27 LAB — CALCIUM, IONIZED: Calcium, Ionized, Serum: 4.4 mg/dL — ABNORMAL LOW (ref 4.5–5.6)

## 2022-01-27 LAB — PHOSPHORUS: Phosphorus: 3.3 mg/dL (ref 2.5–4.6)

## 2022-01-27 LAB — BASIC METABOLIC PANEL
Anion gap: 9 (ref 5–15)
BUN: 12 mg/dL (ref 8–23)
CO2: 20 mmol/L — ABNORMAL LOW (ref 22–32)
Calcium: 7.2 mg/dL — ABNORMAL LOW (ref 8.9–10.3)
Chloride: 107 mmol/L (ref 98–111)
Creatinine, Ser: 0.89 mg/dL (ref 0.61–1.24)
GFR, Estimated: >60 mL/min (ref >60)
Glucose, Bld: 135 mg/dL — ABNORMAL HIGH (ref 70–99)
Potassium: 4.2 mmol/L (ref 3.5–5.1)
Sodium: 136 mmol/L (ref 135–145)

## 2022-01-27 LAB — MAGNESIUM: Magnesium: 1.7 mg/dL (ref 1.7–2.4)

## 2022-01-27 IMAGING — DX DG CHEST 1V PORT
1 series · 1 of 1 positions shown · non-contrast
Comparison: [DATE]

CLINICAL DATA: Shortness of breath, hypoxia

EXAM:
PORTABLE CHEST 1 VIEW

[chest ap]
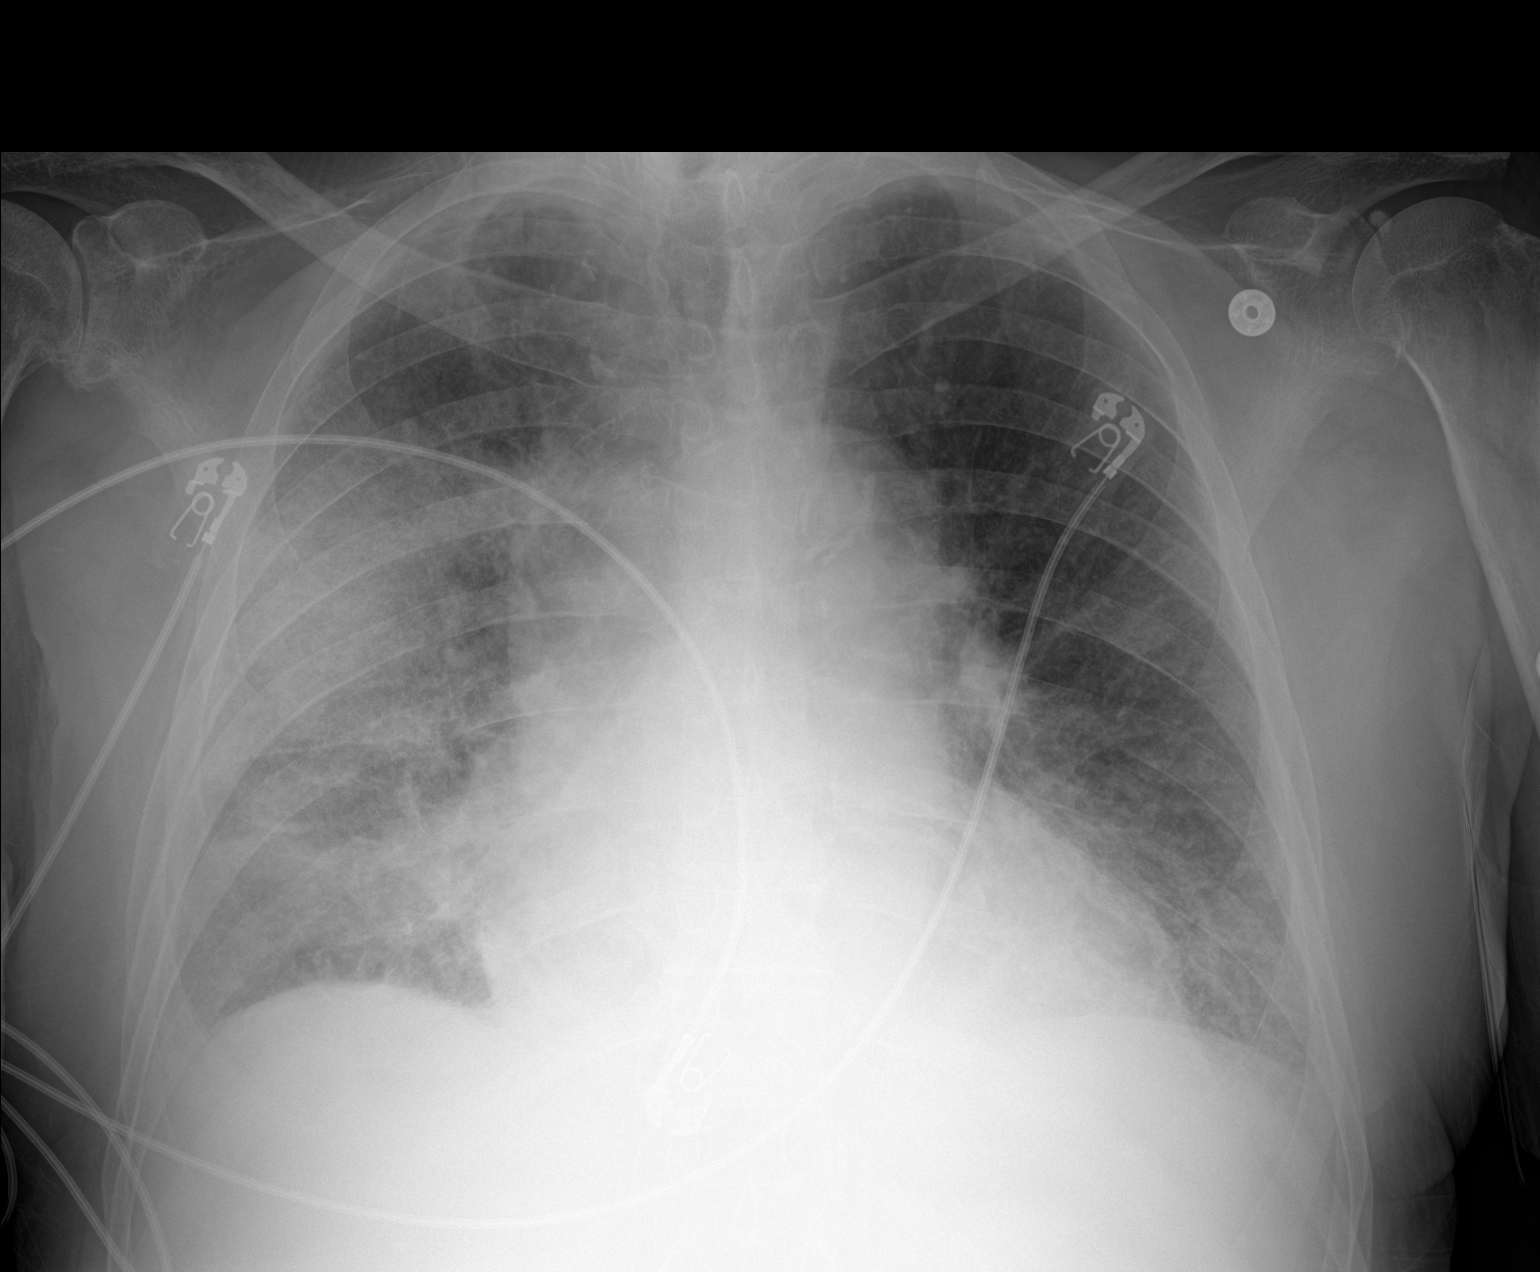

[1 of 1 positions shown; findings below may reference images not displayed]

FINDINGS: New right greater than left opacities are present. No significant
pleural effusion. No pneumothorax. Similar cardiomediastinal
contours.
IMPRESSION: New right greater than left opacities. Favor edema with pneumonia
also possible in the appropriate setting.

## 2022-01-27 MED ORDER — SODIUM CHLORIDE 0.9 % IV SOLN
3.0000 g | Freq: Four times a day (QID) | INTRAVENOUS | Status: DC
  Administered 2022-01-27 – 2022-01-29 (×10): 3 g via INTRAVENOUS
  Filled 2022-01-27 (×9): qty 8

## 2022-01-27 MED ORDER — MAGNESIUM SULFATE 2 GM/50ML IV SOLN
2.0000 g | Freq: Once | INTRAVENOUS | Status: AC
  Administered 2022-01-27: 2 g via INTRAVENOUS
  Filled 2022-01-27: qty 50

## 2022-01-27 MED ORDER — IPRATROPIUM-ALBUTEROL 0.5-2.5 (3) MG/3ML IN SOLN
3.0000 mL | Freq: Four times a day (QID) | RESPIRATORY_TRACT | Status: DC | PRN
  Administered 2022-01-27: 3 mL via RESPIRATORY_TRACT
  Filled 2022-01-27: qty 3

## 2022-01-27 MED ORDER — LEVETIRACETAM IN NACL 500 MG/100ML IV SOLN
500.0000 mg | Freq: Two times a day (BID) | INTRAVENOUS | Status: DC
  Administered 2022-01-27 – 2022-01-28 (×4): 500 mg via INTRAVENOUS
  Filled 2022-01-27 (×4): qty 100

## 2022-01-27 MED ORDER — PANTOPRAZOLE SODIUM 40 MG IV SOLR
40.0000 mg | INTRAVENOUS | Status: DC
  Administered 2022-01-27 – 2022-01-28 (×2): 40 mg via INTRAVENOUS
  Filled 2022-01-27 (×2): qty 40

## 2022-01-27 MED ORDER — ENOXAPARIN SODIUM 40 MG/0.4ML IJ SOSY
40.0000 mg | PREFILLED_SYRINGE | Freq: Every day | INTRAMUSCULAR | Status: DC
  Administered 2022-01-27 – 2022-01-29 (×3): 40 mg via SUBCUTANEOUS
  Filled 2022-01-27 (×3): qty 0.4

## 2022-01-27 MED ORDER — FUROSEMIDE 10 MG/ML IJ SOLN
40.0000 mg | Freq: Two times a day (BID) | INTRAMUSCULAR | Status: AC
  Administered 2022-01-27 (×2): 40 mg via INTRAVENOUS
  Filled 2022-01-27 (×2): qty 4

## 2022-01-27 NOTE — Progress Notes (Addendum)
Interval Progress Note:   Patient has been transitioned to [place], 25 L @ 50%, with oxygen saturation between 88-95%. He appears more comfortable with nasal cannula rather than mask. Patient continues to be altered with orientation only to self at this time.   Voiding trial initiated at this time.   Attempted to update family. Left voicemail for patient's sister, Carles Collet, and son, Trevel Dillenbeck. Unable to call brother Albino Bufford.    Dr. Jose Persia Internal Medicine PGY-3  01/27/2022, 2:06 PM

## 2022-01-27 NOTE — Progress Notes (Addendum)
NAME:  Devin Garza, MRN:  [id number], DOB:  1941/09/06, LOS: 4 ADMISSION DATE:  01/22/2022, CONSULTATION DATE:  01/24/2022 REFERRING MD:  Dr. Walker Shadow, CHIEF COMPLAINT:  ETOH withdrawal seizure    History of Present Illness:  Devin Garza is a 81 y.o. male with a PMH significant for 2 basal cell carcinoma, hypertension, hyperlipidemia, CAD, tobacco abuse, EtOH abuse, and anemia who presented to the ER 1/27 with complaints of fatigue, shortness of breath, and diarrhea that began 7 to 10 days prior to admission.  Patient also reported decreased oral intake but denies vomiting or diarrhea.  Patient was admitted per [facility] for treatment of multiple metabolic derangements including hypokalemia, hypomagnesemia hypokalemia glycemia hypocalcemia.   Mid morning of 1/29 patient had witnessed tonic-clonic seizure lasting approximately 3 minutes.  Seizure activity felt secondary to EtOH withdrawal.  He received 2 mg oral Ativan, 2 mg IM Ativan, and later 2 mg of IV Ativan with eventual cessation of seizure activity.  Post seizure patient seen post ictal with tachypnea currently protecting airway.  PCCM consulted for further management and transfer to ICU.  Neurology also consulted.  Pertinent  Medical History  Basal cell carcinoma GERD Hyperlipidemia Hypertension Osteoarthritis PVD CAD Anemia  Significant Hospital Events: Including procedures, antibiotic start and stop dates in addition to other pertinent events   1/27 admitted for failure to thrive and multiple metabolic derangements 9/62 patient had 3-minute tonic-clonic seizure felt secondary to EtOH withdrawal 1/31 increased oxygen requirements  Interim History / Subjective:   No acute complaints this AM. He endorses a cough that has been ongoing for a short period of time. He denies chronic cough. He denies SOB, chest pain.   Objective   Blood pressure (!) 112/54, pulse 63, temperature 98.2 F (36.8 C), temperature source Oral, resp. rate  (!) 34, height 5\' 10"  (1.778 m), weight 78.9 kg, SpO2 95 %.    FiO2 (%):  [55 %] 55 %   Intake/Output Summary (Last 24 hours) at 01/27/2022 0757 Last data filed at 01/27/2022 0600 Gross per 24 hour  Intake 1731.56 ml  Output 1800 ml  Net -68.44 ml    Filed Weights   01/25/22 0342 01/26/22 0630 01/27/22 0600  Weight: 74.4 kg 78.3 kg 78.9 kg   Examination: General: Acute on chronic ill-appearing elderly male lying in bed HEENT: Dry MM  Neuro: Alert and following commands. Moving all extremities. No tremor.  CV: s1s2 regular rate and rhythm PULM:  Diffuse rhonchi throughout with some expiratory wheezing in the bases. No rales. Minimally increased work of breathing.  GI: soft, non-tender  Extremities: warm/dry, no edema  Skin: no rashes or lesions  Resolved Hospital Problem list   Acute Kidney Injury  Acute rhabdomyolysis  Assessment & Plan:   # Alcohol Withdrawal Seizure # Acute Encephalopathy 2/2 Alcohol Withdrawal  Patient had 1 episode of seizure 1/29 received 2 mg of IV Ativan in the setting of alcohol withdrawal. Will discontinue Phenobarb taper as he is only on low dose Precedex.   - Monitor CIWA  - Continue Keppra 500 mg BID - Continue Precedex gtt . Wean as tolerated  - Discontinue Phenobarbital taper - Continue thiamine and folate supplements  # Acute Hypoxic Respiratory Failure  # Dysphagia Initially suspected to be secondary to seizure and subsequent postictal state, however significant increase in oxygen requirement overnight with tachypnea. Patient has as history of dysphagia with high risk for aspiration. CXR with new airspace opacities bilaterally, but right much  worse than left. Some pulmonary  edema noted as well.   - Continue supplemental oxygen PRN to maintain oxygen saturation above 88% - Start empiric antibiotics w/ Unasyn  - Discontinue IVF - Start Lasix 40 mg IV BID for 24 hours  - Given encephalopathy, high risk for intubation   # Acute urinary  retention - Continue Foley catheter - Continue Bethanechol - Continue Flomax  - Hold off on voiding trial till respiratory status improves  # Hypokalemia/hypophosphatemia/Hypocalcemia/Hypomagnesemia Secondary to likely dehydration in the setting of significant GI losses and poor nutrition 2/2 AUD.   - Monitor electrolytes daily and replenish PRN  # Chronic Normocytic Anemia Hemoglobin trending down during admission, however receiving significant amount of fluids. No suspicion for active bleed.   - Monitor hemoglobin while admitted  # Hypertension # Hyperlipidemia - Continue Norvasc and metoprolol - Holding home Diovan  - Continue Lipitor   # History of GERD - Continue Protonix  Best Practice (right click and "Reselect all SmartList Selections" daily)   Diet/type: Dysphagia diet DVT prophylaxis: SCD GI prophylaxis: PPI Foley:  Yes, and it is still needed. Urinary Retention  Code Status:  full code Last date of multidisciplinary goals of care discussion: Pending   Labs   CBC: Recent Labs  Lab 01/22/22 2213 01/23/22 0437 01/23/22 1651 01/24/22 0229 01/26/22 0237 01/26/22 2304 01/27/22 0247  WBC 8.6 7.9 8.5 7.4 5.7  --  6.2  NEUTROABS 5.9  --   --   --  3.7  --   --   HGB 11.0* 10.4* 10.9* 10.3* 8.7* 7.5* 8.7*  HCT 32.4* 30.4* 31.0* 29.1* 24.7* 22.0* 25.4*  MCV 92.3 93.0 91.4 91.5 92.2  --  92.7  PLT 243 213 216 223 226  --  950    Basic Metabolic Panel: Recent Labs  Lab 01/23/22 1651 01/24/22 0229 01/24/22 1421 01/25/22 0021 01/26/22 0237 01/26/22 1559 01/26/22 2304 01/27/22 0247  NA 137 136 139 141 136  --  137 136  K 3.6 4.4 3.6 3.6 3.4*  --  4.2 4.2  CL 109 107 109 113* 108  --   --  107  CO2 13* 13* 16* 14* 18*  --   --  20*  GLUCOSE 158* 141* 148* 110* 140*  --   --  135*  BUN 35* 29* 22 19 14   --   --  12  CREATININE 1.44* 1.20 1.14 1.05 0.78  --   --  0.89  CALCIUM 7.0* 7.2* 7.3* 6.9* 7.1*  --   --  7.2*  MG 1.9 1.6* 2.5* 1.7 1.2* 2.2  --   1.7  PHOS 2.8 2.5  --  1.9* 2.5  --   --  3.3    GFR: Estimated Creatinine Clearance: 67.2 mL/min (by C-G formula based on SCr of 0.89 mg/dL). Recent Labs  Lab 01/23/22 1651 01/24/22 0229 01/26/22 0237 01/27/22 0247  WBC 8.5 7.4 5.7 6.2    Liver Function Tests: Recent Labs  Lab 01/22/22 2213  AST 67*  ALT 32  ALKPHOS 51  BILITOT 0.8  PROT 6.6  ALBUMIN 3.2*    ABG    Component Value Date/Time   PHART 7.475 (H) 01/26/2022 2304   PCO2ART 28.8 (L) 01/26/2022 2304   PO2ART 78 (L) 01/26/2022 2304   HCO3 21.3 01/26/2022 2304   TCO2 22 01/26/2022 2304   ACIDBASEDEF 2.0 01/26/2022 2304   O2SAT 97.0 01/26/2022 2304     Coagulation Profile: No results for input(s): INR, PROTIME in the last 168 hours.  Cardiac  Enzymes: Recent Labs  Lab 01/23/22 1651 01/26/22 0237  CKTOTAL 2,944* 327    HbA1C: Hgb A1c MFr Bld  Date/Time Value Ref Range Status  10/16/2020 02:51 AM 5.7 (H) 4.8 - 5.6 % Final    Comment:    (NOTE)         Prediabetes: 5.7 - 6.4         Diabetes: >6.4         Glycemic control for adults with diabetes: <7.0   02/25/2020 12:52 PM 5.2 4.6 - 6.5 % Final    Comment:    Glycemic Control Guidelines for People with Diabetes:Non Diabetic:  <6%Goal of Therapy: <7%Additional Action Suggested:  >8%    Dr. Jose Persia Internal Medicine PGY-3  01/27/2022, 10:00 AM

## 2022-01-27 NOTE — Progress Notes (Signed)
Speech Language Pathology Treatment: Dysphagia  Patient Details Name: Devin Garza MRN: [id number] DOB: 12-Sep-1941 Today's Date: 01/27/2022 Time: 6389-3734 SLP Time Calculation (min) (ACUTE ONLY): 20 min  Assessment / Plan / Recommendation Clinical Impression  Patient seen by SLP for dysphagia treatment and skilled observation of his toleration of PO's. Per RN, patient continues to be fluctuating between lethargy and restlessness/agitation but MD has decreased his precidex. When SLP arrived in room with student SLP present as well, patient was in chair position in bed with eyes closed. He did wake up with verbal and tactile cues but overall was lethargic. SLP removed oxygen mask and monitored patient, with SpO2 decreasing from 96% to 90%. Patient had poor awareness to PO's when SLP holding cup of nectar thick liquids to his lips and exhibited anterior spillage. When SLP assisted by holding patient's elbow and assisting with him holding cup in left hand, he was able to bring to mouth and give self sips. Swallow initiation was more timely when patient holding cup (assisted). One instance of congested sounding cough but otherwise, he appears to be tolerating nectar thick consistency liquids. SLP downgraded patient's diet to Dys 1 (from Dys 2) and spoke with RN regarding this change. Patient will likely not take in enough PO's to achieve nutritional goals however plan is to feed patient small amounts when he is awake/alert. SLP will continue to follow patient for diet toleration and readiness to upgrade versus needing objective swallow study.    HPI HPI: 81 y.o. male presented with ongoing weakness and fatigue. Found to have Hypocalcemia,Hypokalemia, and AKI/rhabdomyolysis. Developed alcohol withdrawal with DTs. 1/29 pt with generalized tonic clonic seizure that lasted for about 3 minutes (? ETOH withdrawl). SInce admission has been coughing intermittently with POs.  PHMx:  mild Schatzki ring (acquired) was  found in the distal esophagus per endoscopy 2018; seizures, carotid artery disease, hyperlipidemia, GI bleed, gout, hypertension, anemia, basal cell carcinoma, GERD      SLP Plan         Recommendations for follow up therapy are one component of a multi-disciplinary discharge planning process, led by the attending physician.  Recommendations may be updated based on patient status, additional functional criteria and insurance authorization.    Recommendations  Diet recommendations: Dysphagia 1 (puree);Nectar-thick liquid Liquids provided via: Cup Medication Administration: Whole meds with puree Supervision: Full supervision/cueing for compensatory strategies;Staff to assist with self feeding Compensations: Slow rate;Small sips/bites Postural Changes and/or Swallow Maneuvers: Seated upright 90 degrees                Oral Care Recommendations: Oral care BID;Staff/trained caregiver to provide oral care Follow Up Recommendations: Other (comment) (TBD)          Sonia Baller, MA, CCC-SLP Speech Therapy

## 2022-01-27 NOTE — Progress Notes (Signed)
[facility] ADULT ICU REPLACEMENT PROTOCOL   The patient does apply for the [facility] Adult ICU Electrolyte Replacment Protocol based on the criteria listed below:   1.Exclusion criteria: TCTS patients, ECMO patients, and Dialysis patients 2. Is GFR >/= 30 ml/min? Yes.    Patient's GFR today is >60 3. Is SCr </= 2? Yes.   Patient's SCr is 0.89 mg/dL 4. Did SCr increase >/= 0.5 in 24 hours? No. 5.Pt's weight >40kg  Yes.   6. Abnormal electrolyte(s): mag 1.7  7. Electrolytes replaced per protocol 8.  Call MD STAT for K+ </= 2.5, Phos </= 1, or Mag </= 1 Physician:  n/a  Devin Garza 01/27/2022 4:21 AM

## 2022-01-27 NOTE — Progress Notes (Signed)
Physical Therapy Treatment Patient Details Name: Keymani Mclean MRN: [id number] DOB: [birth year] Today's Date: 01/27/2022   History of Present Illness Nicholad Kautzman is a 81 y.o. male presented with ongoing weakness and fatigue. Found to have Hypocalcemia,Hypokalemia, and AKI/rhabdomyolysis. Developed alcohol withdrawal with DTs. 1/29 pt with generalized tonic clonic seizure that lasted for about 3 minutes (? ETOH withdrawl).  PHMx: seizures, carotid artery disease, hyperlipidemia, GI bleed, gout, hypertension, anemia, basal cell carcinoma, GERD    PT Comments    Pt more alert and interactive than on eval. Worked on sitting and trunk stability from chair position in the bed. Needs 2 person assist to attempt any further mobility.    Recommendations for follow up therapy are one component of a multi-disciplinary discharge planning process, led by the attending physician.  Recommendations may be updated based on patient status, additional functional criteria and insurance authorization.  Follow Up Recommendations  Skilled nursing-short term rehab (<3 hours/day)     Assistance Recommended at Discharge Frequent or constant Supervision/Assistance  Patient can return home with the following Two people to help with walking and/or transfers;Two people to help with bathing/dressing/bathroom;Assist for transportation;Direct supervision/assist for medications management;Assistance with cooking/housework   Equipment Recommendations  None recommended by PT    Recommendations for Other Services       Precautions / Restrictions Precautions Precautions: Fall     Mobility  Bed Mobility Overal bed mobility: Needs Assistance             General bed mobility comments: Placed bed in chair position. Worked on pt pulling trunk forward into unsupported sitting with mod to max assist. Repeated 8 times.    Transfers                   General transfer comment: Did not attempt     Ambulation/Gait                   Stairs             Wheelchair Mobility    Modified Rankin (Stroke Patients Only)       Balance Overall balance assessment: Needs assistance Sitting-balance support: Single extremity supported Sitting balance-Leahy Scale: Poor Sitting balance - Comments: maintains sitting forward from chair position in bed with mod assist Postural control: Posterior lean                                  Cognition Arousal/Alertness: Awake/alert Behavior During Therapy: Restless Overall Cognitive Status: No family/caregiver present to determine baseline cognitive functioning Area of Impairment: Orientation, Attention, Following commands, Safety/judgement, Awareness, Problem solving                 Orientation Level: Disoriented to, Situation, Time Current Attention Level: Sustained   Following Commands: Follows one step commands with increased time, Follows one step commands consistently Safety/Judgement: Decreased awareness of safety, Decreased awareness of deficits Awareness: Intellectual Problem Solving: Slow processing, Decreased initiation, Difficulty sequencing, Requires verbal cues, Requires tactile cues          Exercises General Exercises - Upper Extremity Shoulder Flexion: AAROM, Left, 10 reps, Seated General Exercises - Lower Extremity Long Arc Quad: AROM, Both, 10 reps, Seated    General Comments        Pertinent Vitals/Pain Pain Assessment Faces Pain Scale: Hurts little more Pain Location: RUE with shoulder movement Pain Descriptors / Indicators: Grimacing, Guarding, Moaning Pain Intervention(s): Limited activity within  patient's tolerance, Repositioned    Home Living                          Prior Function            PT Goals (current goals can now be found in the care plan section) Acute Rehab PT Goals Patient Stated Goal: Unable to state Progress towards PT goals: Progressing  toward goals    Frequency    Min 3X/week      PT Plan Current plan remains appropriate    Co-evaluation              AM-PAC PT "6 Clicks" Mobility   Outcome Measure  Help needed turning from your back to your side while in a flat bed without using bedrails?: Total Help needed moving from lying on your back to sitting on the side of a flat bed without using bedrails?: Total Help needed moving to and from a bed to a chair (including a wheelchair)?: Total Help needed standing up from a chair using your arms (e.g., wheelchair or bedside chair)?: Total Help needed to walk in hospital room?: Total Help needed climbing 3-5 steps with a railing? : Total 6 Click Score: 6    End of Session Equipment Utilized During Treatment: Oxygen Activity Tolerance: Patient limited by fatigue Patient left: in bed;with call bell/phone within reach;with bed alarm set Nurse Communication: Mobility status PT Visit Diagnosis: Other abnormalities of gait and mobility (R26.89);History of falling (Z91.81);Difficulty in walking, not elsewhere classified (R26.2);Muscle weakness (generalized) (M62.81)     Time: 7530-0511 PT Time Calculation (min) (ACUTE ONLY): 18 min  Charges:  $Therapeutic Activity: 8-22 mins                     [place] Pager [phone] Office [place] 01/27/2022, 12:02 PM

## 2022-01-27 NOTE — Progress Notes (Signed)
Nutrition Follow-up  DOCUMENTATION CODES:   Non-severe (moderate) malnutrition in context of social or environmental circumstances  INTERVENTION:   - Increase Ensure Enlive po to TID, each supplement provides 350 kcal and 20 grams of protein  - MVI with minerals daily  - Continue to encourage PO intake  NUTRITION DIAGNOSIS:   Moderate Malnutrition (in the context of social/environmental circumstances) related to (lack of nutrient dense foods due to EtOH abuse) as evidenced by mild fat depletion, moderate muscle depletion.  Ongoing, being addressed via oral nutrition supplements  GOAL:   Patient will meet greater than or equal to 90% of their needs  Progressing  MONITOR:   PO intake, Supplement acceptance, Labs, Skin, Weight trends  REASON FOR ASSESSMENT:   Malnutrition Screening Tool    ASSESSMENT:   81 y.o. male with history of seizures, CAD, PVD, HLD, gout, HTN, EtOH abuse, and GERD presented to ED with ongoing weakness, fatigue, and poor PO for the past week. Found to have significant electrolyte abnormalities and and AKI on admission.  02/01 - diet downgraded to dysphagia 1 with nectar-thick liquids  Discussed pt with RN. Plan to discuss pt during ICU rounds. Pt eating much better. Per RN, pt consumed 70% of dinner last night and ate well at breakfast today. Pt also just finished drinking 100% of an Ensure Enlive (350 kcal, 20 grams of protein).  Pt asking if we have scotch available. Pt states that he is eating well. RD encouraged pt to continue to eat well. Per RN, pt also likes [employer] which will come on his meal trays if available.  Admit weight: 74.9 kg Current weight: 79.3 kg  Pt with mild pitting edema to BLE.  Meal Completion: 25-70%  Medications reviewed and include: Ensure Enlive BID, folic acid, IV lasix, melatonin, MVI with minerals, IV protonix, thiamine, IV abx, precedex, IV magnesium sulfate 2 grams once followed by 4 grams once  Labs  reviewed: magnesium 1.3, hemoglobin 8.4  UOP: 2955 ml x 24 hours I/O's: +2.0 L since admit  Diet Order:   Diet Order             DIET DYS 2 Room service appropriate? Yes; Fluid consistency: Thin  Diet effective now                   EDUCATION NEEDS:   Education needs have been addressed  Skin:  Skin Assessment: Reviewed RN Assessment (abrasions/scabs to the left arm and leg, stage 1 pressure injury to the sacrum)  Last BM:  01/27/22 large type 6  Height:   Ht Readings from Last 1 Encounters:  01/22/22 5\' 10"  (1.778 m)    Weight:   Wt Readings from Last 1 Encounters:  01/28/22 79.3 kg    Ideal Body Weight:  75.5 kg  BMI:  Body mass index is 25.08 kg/m.  Estimated Nutritional Needs:   Kcal:  1850-2050 kcal/d  Protein:  80-90 g/d  Fluid:  >2 L/d    Gustavus Bryant, MS, RD, LDN Inpatient Clinical Dietitian Please see AMiON for contact information.

## 2022-01-27 NOTE — TOC Progression Note (Addendum)
Transition of Care [facility]) - Initial/Assessment Note    Patient Details  Name: Devin Garza MRN: [id number] Date of Birth: Mar 20, 1941  [facility]) CM/SW Contact:    Milinda Antis, [place] Phone Number: 01/27/2022, 3:35 PM  Clinical Narrative:                  CSW received consult for SNF placement.  Due to the patient only being oriented to person, attempts were made to contact the patients family.  CSW called [place], [facility] [phone]);  Mein,Jill (Sister [phone]), and [place], Wery  [facility] [phone]).  CSW was unable to speak with a live person, but left VM's on all of the above phone numbers requesting a returned call.    16:00-  CSW received a returned call from the patient's son Devin Garza.  Devin Garza informed CSW that he lives in [place] and the patient's closest family member lives in [place].  The son is agreeable to the patient going to a SNF in the [place] area and does not have a facility choice.  The son will update other family members and ask that the patient's sister return call to CSW.  16:15-  CSW received a returned call from the patient's sister Devin Garza.  She is also onboard with the patient going to a SNF for rehab.       Patient Goals and CMS Choice        Expected Discharge Plan and Services                                                Prior Living Arrangements/Services                       Activities of Daily Living Home Assistive Devices/Equipment: Grab bars around toilet, Walker (specify type), Cane (specify quad or straight) ADL Screening (condition at time of admission) Patient's cognitive ability adequate to safely complete daily activities?: No Is the patient deaf or have difficulty hearing?: No Does the patient have difficulty seeing, even when wearing glasses/contacts?: Yes Does the patient have difficulty concentrating, remembering, or making decisions?: No Patient able to express need for assistance with  ADLs?: Yes Does the patient have difficulty dressing or bathing?: No Independently performs ADLs?: Yes (appropriate for developmental age) (per pt report) Does the patient have difficulty walking or climbing stairs?: Yes Weakness of Legs: Right Weakness of Arms/Hands: Right (R shoulder per pt and tremors)  Permission Sought/Granted                  Emotional Assessment              Admission diagnosis:  Hypocalcemia [E83.51] Dehydration [E86.0] Hypokalemia [E87.6] Poor appetite [R63.0] Heme positive stool [R19.5] Elevated troponin [R77.8] Generalized weakness [R53.1] AKI (acute kidney injury) ([place]) [N17.9] Diarrhea, unspecified type [R19.7] Patient Active Problem List   Diagnosis Date Noted   Acute respiratory failure with hypoxia ([place])    Delirium    Malnutrition of moderate degree 01/25/2022   Hypocalcemia 01/22/2022   AKI (acute kidney injury) ([place]) 01/22/2022   Fall 10/16/2020   Pressure injury of skin 10/16/2020   Seizure ([place]) 10/15/2020   Hypokalemia 10/15/2020   Hypomagnesemia 10/15/2020   Hyperglycemia 10/15/2020   Right hip pain 10/15/2020   Acute urinary retention 10/15/2020   History of alcohol abuse 10/15/2020  Gout 03/21/2017   History of upper gastrointestinal bleeding 11/10/2016   Angiodysplasia of stomach and duodenum without hemorrhage    Benign neoplasm of transverse colon    Benign neoplasm of descending colon    GERD without esophagitis 05/28/2015   Tubular adenoma of colon 05/28/2015   Prediabetes 04/21/2011   Alcohol abuse 01/21/2011   Anemia, unspecified 11/12/2009   Basal cell carcinoma of skin 09/03/2008   Unspecified malignant neoplasm of other specified sites of skin 09/03/2008   Carotid artery stenosis 08/28/2008   Insomnia 08/28/2008   Occlusion and stenosis of carotid artery 08/28/2008   Mixed hyperlipidemia 08/27/2008   Essential hypertension 08/27/2008   PCP:  Isaac Bliss, Rayford Halsted, MD Pharmacy:   [facility], [address] [place] [address] Phone: [phone] Fax: [phone]  [facility] - [address], [place] [address] Phone: [phone] Fax: [phone]  [id number] - [place], [address]. AT [address] [place] [address] Phone: [phone] Fax: [phone]  [place], [address] [place] [address] Phone: [phone] Fax: [phone]     Social Determinants of Health (SDOH) Interventions    Readmission Risk Interventions Readmission Risk Prevention Plan 10/21/2020  Transportation Screening Complete  PCP or Specialist Appt within 3-5 Days Complete  HRI or [place] Complete  Social Work Consult for [place] Planning/Counseling Complete  Palliative Care Screening Not Applicable  Medication Review Press photographer) Complete  Some recent data might be hidden

## 2022-01-28 DIAGNOSIS — J9601 Acute respiratory failure with hypoxia: Secondary | ICD-10-CM | POA: Diagnosis not present

## 2022-01-28 DIAGNOSIS — R410 Disorientation, unspecified: Secondary | ICD-10-CM | POA: Diagnosis not present

## 2022-01-28 LAB — CBC
HCT: 24.9 % — ABNORMAL LOW (ref 39.0–52.0)
Hemoglobin: 8.4 g/dL — ABNORMAL LOW (ref 13.0–17.0)
MCH: 31.2 pg (ref 26.0–34.0)
MCHC: 33.7 g/dL (ref 30.0–36.0)
MCV: 92.6 fL (ref 80.0–100.0)
Platelets: 254 10*3/uL (ref 150–400)
RBC: 2.69 MIL/uL — ABNORMAL LOW (ref 4.22–5.81)
RDW: 12.3 % (ref 11.5–15.5)
WBC: 6 10*3/uL (ref 4.0–10.5)
nRBC: 0 % (ref 0.0–0.2)

## 2022-01-28 LAB — BASIC METABOLIC PANEL WITH GFR
Anion gap: 9 (ref 5–15)
BUN: 12 mg/dL (ref 8–23)
CO2: 24 mmol/L (ref 22–32)
Calcium: 7.2 mg/dL — ABNORMAL LOW (ref 8.9–10.3)
Chloride: 102 mmol/L (ref 98–111)
Creatinine, Ser: 1.03 mg/dL (ref 0.61–1.24)
GFR, Estimated: >60 mL/min
Glucose, Bld: 135 mg/dL — ABNORMAL HIGH (ref 70–99)
Potassium: 3.5 mmol/L (ref 3.5–5.1)
Sodium: 135 mmol/L (ref 135–145)

## 2022-01-28 LAB — MAGNESIUM
Magnesium: 1.3 mg/dL — ABNORMAL LOW (ref 1.7–2.4)
Magnesium: 2.1 mg/dL (ref 1.7–2.4)

## 2022-01-28 LAB — PHOSPHORUS: Phosphorus: 3.4 mg/dL (ref 2.5–4.6)

## 2022-01-28 LAB — GLUCOSE, CAPILLARY: Glucose-Capillary: 168 mg/dL — ABNORMAL HIGH (ref 70–99)

## 2022-01-28 MED ORDER — PANTOPRAZOLE SODIUM 40 MG PO TBEC
40.0000 mg | DELAYED_RELEASE_TABLET | Freq: Every day | ORAL | Status: DC
  Administered 2022-01-29: 40 mg via ORAL
  Filled 2022-01-28: qty 1

## 2022-01-28 MED ORDER — FUROSEMIDE 10 MG/ML IJ SOLN
40.0000 mg | Freq: Once | INTRAMUSCULAR | Status: AC
  Administered 2022-01-28: 40 mg via INTRAVENOUS
  Filled 2022-01-28: qty 4

## 2022-01-28 MED ORDER — MAGNESIUM SULFATE 2 GM/50ML IV SOLN
2.0000 g | Freq: Once | INTRAVENOUS | Status: DC
  Filled 2022-01-28: qty 50

## 2022-01-28 MED ORDER — ENSURE ENLIVE PO LIQD
237.0000 mL | Freq: Three times a day (TID) | ORAL | Status: DC
  Administered 2022-01-28 (×2): 237 mL via ORAL

## 2022-01-28 MED ORDER — POTASSIUM CHLORIDE CRYS ER 20 MEQ PO TBCR
40.0000 meq | EXTENDED_RELEASE_TABLET | Freq: Once | ORAL | Status: AC
  Administered 2022-01-28: 40 meq via ORAL
  Filled 2022-01-28: qty 2

## 2022-01-28 MED ORDER — MAGNESIUM SULFATE 4 GM/100ML IV SOLN
4.0000 g | Freq: Once | INTRAVENOUS | Status: AC
  Administered 2022-01-28: 4 g via INTRAVENOUS
  Filled 2022-01-28 (×2): qty 100

## 2022-01-28 MED ORDER — MAGNESIUM SULFATE 2 GM/50ML IV SOLN
2.0000 g | Freq: Once | INTRAVENOUS | Status: AC
  Administered 2022-01-28: 2 g via INTRAVENOUS

## 2022-01-28 MED ORDER — METOPROLOL TARTRATE 25 MG PO TABS
25.0000 mg | ORAL_TABLET | Freq: Two times a day (BID) | ORAL | Status: DC
  Administered 2022-01-28 – 2022-01-29 (×2): 25 mg via ORAL
  Filled 2022-01-28 (×2): qty 1

## 2022-01-28 MED ORDER — MAGNESIUM SULFATE 4 GM/100ML IV SOLN
4.0000 g | Freq: Once | INTRAVENOUS | Status: AC
  Administered 2022-01-28: 4 g via INTRAVENOUS
  Filled 2022-01-28: qty 100

## 2022-01-28 NOTE — Progress Notes (Signed)
Physical Therapy Treatment Patient Details Name: Devin Garza MRN: [id number] DOB: 25-Nov-1941 Today's Date: 01/28/2022   History of Present Illness Devin Garza is a 81 y.o. male presented with ongoing weakness and fatigue. Found to have Hypocalcemia,Hypokalemia, and AKI/rhabdomyolysis. Developed alcohol withdrawal with DTs. 1/29 pt with generalized tonic clonic seizure that lasted for about 3 minutes (? ETOH withdrawl).  PHMx: seizures, carotid artery disease, hyperlipidemia, GI bleed, gout, hypertension, anemia, basal cell carcinoma, GERD    PT Comments    Pt much more alert and interactive and with improved mobility. Able to tolerate standing and OOB to chair using Stedy. Expect next visit will be able to initiate ambulation. Continue to recommend SNF since pt lives alone in independent living and is currently requiring assist with all mobility.    Recommendations for follow up therapy are one component of a multi-disciplinary discharge planning process, led by the attending physician.  Recommendations may be updated based on patient status, additional functional criteria and insurance authorization.  Follow Up Recommendations  Skilled nursing-short term rehab (<3 hours/day)     Assistance Recommended at Discharge Frequent or constant Supervision/Assistance  Patient can return home with the following Two people to help with walking and/or transfers;Two people to help with bathing/dressing/bathroom;Assist for transportation;Direct supervision/assist for medications management;Assistance with cooking/housework   Equipment Recommendations  None recommended by PT    Recommendations for Other Services       Precautions / Restrictions Precautions Precautions: Fall     Mobility  Bed Mobility Overal bed mobility: Needs Assistance Bed Mobility: Supine to Sit     Supine to sit: +2 for physical assistance, Mod assist     General bed mobility comments: Assist to bring legs off of bed,  elevate trunk into sitting and bring hips to EOB    Transfers Overall transfer level: Needs assistance Equipment used: Ambulation equipment used Transfers: Sit to/from Stand, Bed to chair/wheelchair/BSC Sit to Stand: +2 physical assistance, Mod assist           General transfer comment: Stood from bed with Stedy and +2 mod assist for bring hips up. Verbal/tactile cues to extend hips/trunk. Used [employer] for bed to Management consultant: Stedy  Ambulation/Gait             Pre-gait activities: Stood x 2 in [place]. On 2nd stand stood ~90 sec with min assist to maintain. Verbal/tactile cues to stand more erect. Worked on weight shifting in standing in [employer] Mobility    Modified Rankin (Stroke Patients Only)       Balance Overall balance assessment: Needs assistance Sitting-balance support: Single extremity supported Sitting balance-Leahy Scale: Poor Sitting balance - Comments: Sat EOB x 10 minutes with min assist initially and improving to min guard. Initial rt side lean Postural control: Right lateral lean                                  Cognition Arousal/Alertness: Awake/alert Behavior During Therapy: WFL for tasks assessed/performed Overall Cognitive Status: No family/caregiver present to determine baseline cognitive functioning Area of Impairment: Orientation, Attention, Following commands, Safety/judgement, Awareness, Problem solving, Memory                 Orientation Level: Disoriented to, Situation, Time Current Attention Level: Sustained Memory: Decreased short-term memory Following Commands: Follows one step commands  with increased time, Follows one step commands consistently Safety/Judgement: Decreased awareness of safety, Decreased awareness of deficits Awareness: Intellectual Problem Solving: Slow processing, Decreased initiation, Difficulty sequencing, Requires verbal cues, Requires  tactile cues          Exercises      General Comments General comments (skin integrity, edema, etc.): Pt on 10L HHFNC (couldn't tolerate face mask) and VSS      Pertinent Vitals/Pain Pain Assessment Pain Assessment: Faces Faces Pain Scale: Hurts little more Pain Location: all over Pain Descriptors / Indicators: Grimacing, Guarding Pain Intervention(s): Limited activity within patient's tolerance, Monitored during session, Repositioned    Home Living                          Prior Function            PT Goals (current goals can now be found in the care plan section) Acute Rehab PT Goals Patient Stated Goal: To get out of here Progress towards PT goals: Progressing toward goals    Frequency    Min 2X/week      PT Plan Current plan remains appropriate;Frequency needs to be updated    Co-evaluation PT/OT/SLP Co-Evaluation/Treatment: Yes Reason for Co-Treatment: For patient/therapist safety;Complexity of the patient's impairments (multi-system involvement) PT goals addressed during session: Mobility/safety with mobility;Balance        AM-PAC PT "6 Clicks" Mobility   Outcome Measure  Help needed turning from your back to your side while in a flat bed without using bedrails?: Total Help needed moving from lying on your back to sitting on the side of a flat bed without using bedrails?: Total Help needed moving to and from a bed to a chair (including a wheelchair)?: Total Help needed standing up from a chair using your arms (e.g., wheelchair or bedside chair)?: Total Help needed to walk in hospital room?: Total Help needed climbing 3-5 steps with a railing? : Total 6 Click Score: 6    End of Session Equipment Utilized During Treatment: Oxygen;Gait belt Activity Tolerance: Patient tolerated treatment well Patient left: with call bell/phone within reach;in chair;with chair alarm set;Other (comment) (SLP present) Nurse Communication: Mobility status;Need  for lift equipment PT Visit Diagnosis: Other abnormalities of gait and mobility (R26.89);History of falling (Z91.81);Difficulty in walking, not elsewhere classified (R26.2);Muscle weakness (generalized) (M62.81)     Time: 7510-2585 PT Time Calculation (min) (ACUTE ONLY): 24 min  Charges:  $Therapeutic Activity: 8-22 mins                     [place] Pager [phone] Office [place] 01/28/2022, 11:19 AM

## 2022-01-28 NOTE — Progress Notes (Signed)
Occupational Therapy Treatment Patient Details Name: Devin Garza MRN: [id number] DOB: 1941/05/20 Today's Date: 01/28/2022   History of present illness Devin Garza is a 81 y.o. male presented with ongoing weakness and fatigue. Found to have Hypocalcemia,Hypokalemia, and AKI/rhabdomyolysis. Developed alcohol withdrawal with DTs. 1/29 pt with generalized tonic clonic seizure that lasted for about 3 minutes (? ETOH withdrawl).  PHMx: seizures, carotid artery disease, hyperlipidemia, GI bleed, gout, hypertension, anemia, basal cell carcinoma, GERD   OT comments  This 81 yo male doing better today with overall with cognition, mobility, self care. He will continue to benefit from acute OT with follow up still recommended for SNF.   Recommendations for follow up therapy are one component of a multi-disciplinary discharge planning process, led by the attending physician.  Recommendations may be updated based on patient status, additional functional criteria and insurance authorization.    Follow Up Recommendations  Skilled nursing-short term rehab (<3 hours/day)    Assistance Recommended at Discharge Frequent or constant Supervision/Assistance  Patient can return home with the following  Two people to help with bathing/dressing/bathroom;Two people to help with walking and/or transfers;Assistance with feeding;Assistance with cooking/housework;Assist for transportation;Direct supervision/assist for financial management;Direct supervision/assist for medications management   Equipment Recommendations  Other (comment) (TBD next venue)       Precautions / Restrictions Precautions Precautions: Fall Restrictions Weight Bearing Restrictions: No       Mobility Bed Mobility Overal bed mobility: Needs Assistance Bed Mobility: Supine to Sit     Supine to sit: +2 for physical assistance, Mod assist     General bed mobility comments: Assist to bring legs off of bed, elevate trunk into sitting and  bring hips to EOB    Transfers Overall transfer level: Needs assistance Equipment used: Ambulation equipment used Transfers: Sit to/from Stand, Bed to chair/wheelchair/BSC Sit to Stand: +2 physical assistance, Mod assist           General transfer comment: Stood from bed with Stedy and +2 mod assist for bring hips up. Verbal/tactile cues to extend hips/trunk. Used Stedy for bed to Management consultant: [employer] Overall balance assessment: Needs assistance Sitting-balance support: Single extremity supported Sitting balance-Leahy Scale: Poor Sitting balance - Comments: Sat EOB x 10 minutes with min assist initially and improving to min guard. Initial rt side lean Postural control: Right lateral lean Standing balance support: Bilateral upper extremity supported Standing balance-Leahy Scale: Poor                             ADL either performed or assessed with clinical judgement   ADL Overall ADL's : Needs assistance/impaired Eating/Feeding: Set up;Supervision/ safety;Sitting Eating/Feeding Details (indicate cue type and reason): in recliner                     Toilet Transfer: Moderate assistance;+2 for physical assistance Toilet Transfer Details (indicate cue type and reason): use of sara stedy Toileting- Clothing Manipulation and Hygiene: Total assistance Toileting - Clothing Manipulation Details (indicate cue type and reason): Mod A +2 sit<>stand            Extremity/Trunk Assessment Upper Extremity Assessment Upper Extremity Assessment: Generalized weakness            Vision Baseline Vision/History: 1 Wears glasses Ability to See in Adequate Light: 0 Adequate            Cognition Arousal/Alertness: Awake/alert Behavior During Therapy: Devin Garza for tasks  assessed/performed Overall Cognitive Status: No family/caregiver present to determine baseline cognitive functioning Area of Impairment: Orientation, Attention, Following  commands, Safety/judgement, Awareness, Problem solving, Memory                 Orientation Level: Disoriented to, Situation, Time Current Attention Level: Sustained Memory: Decreased short-term memory Following Commands: Follows one step commands with increased time, Follows one step commands consistently Safety/Judgement: Decreased awareness of safety, Decreased awareness of deficits Awareness: Intellectual Problem Solving: Slow processing, Decreased initiation, Difficulty sequencing, Requires verbal cues, Requires tactile cues                General Comments Pt on 10L HHFNC (couldn't tolerate face mask) and VSS    Pertinent Vitals/ Pain       Pain Assessment Pain Assessment: Faces Faces Pain Scale: Hurts little more Pain Location: all over Pain Descriptors / Indicators: Grimacing, Guarding Pain Intervention(s): Limited activity within patient's tolerance, Monitored during session, Repositioned         Frequency  Min 2X/week        Progress Toward Goals  OT Goals(current goals can now be found in the care plan section)  Progress towards OT goals: Progressing toward goals  Acute Rehab OT Goals Patient Stated Goal: to get out of this place OT Goal Formulation: With patient Time For Goal Achievement: 02/08/22 Potential to Achieve Goals: Good  Plan Discharge plan remains appropriate    Co-evaluation    PT/OT/SLP Co-Evaluation/Treatment: Yes Reason for Co-Treatment: For patient/therapist safety;To address functional/ADL transfers PT goals addressed during session: Mobility/safety with mobility;Balance OT goals addressed during session: Strengthening/ROM;ADL's and self-care      AM-PAC OT "6 Clicks" Daily Activity     Outcome Measure   Help from another person eating meals?: A Little Help from another person taking care of personal grooming?: A Little Help from another person toileting, which includes using toliet, bedpan, or urinal?: A Lot Help from  another person bathing (including washing, rinsing, drying)?: A Lot Help from another person to put on and taking off regular upper body clothing?: A Lot Help from another person to put on and taking off regular lower body clothing?: A Lot 6 Click Score: 14    End of Session Equipment Utilized During Treatment: Gait belt  OT Visit Diagnosis: Other abnormalities of gait and mobility (R26.89);Repeated falls (R29.6);History of falling (Z91.81);Pain;Other symptoms and signs involving cognitive function Pain - part of body:  (all oever)   Activity Tolerance Patient tolerated treatment well   Patient Left in chair;with call bell/phone within reach;with chair alarm set   Nurse Communication Mobility status;Need for lift equipment        Time: [phone] OT Time Calculation (min): 25 min  Charges: OT General Charges $OT Visit: 1 Visit OT Treatments $Self Care/Home Management : 8-22 mins  Golden Circle, OTR/L Acute [employer] Pager [phone] Office [phone]    Almon Register 01/28/2022, 12:33 PM

## 2022-01-28 NOTE — Progress Notes (Signed)
[facility] ADULT ICU REPLACEMENT PROTOCOL   The patient does apply for the [facility] Adult ICU Electrolyte Replacment Protocol based on the criteria listed below:   1.Exclusion criteria: TCTS patients, ECMO patients, and Dialysis patients 2. Is GFR >/= 30 ml/min? Yes.    Patient's GFR today is >60 3. Is SCr </= 2? Yes.   Patient's SCr is 1.03 mg/dL 4. Did SCr increase >/= 0.5 in 24 hours? No. 5.Pt's weight >40kg  Yes.   6. Abnormal electrolyte(s): mag 1.3, K+ 3.5  7. Electrolytes replaced per protocol 8.  Call MD STAT for K+ </= 2.5, Phos </= 1, or Mag </= 1 Physician:  n/a  Darlys Gales 01/28/2022 4:39 AM

## 2022-01-28 NOTE — Progress Notes (Signed)
NAME:  Devin Garza, MRN:  [id number], DOB:  02-Mar-1941, LOS: 5 ADMISSION DATE:  01/22/2022, CONSULTATION DATE:  01/24/2022 REFERRING MD:  Dr. Walker Shadow, CHIEF COMPLAINT:  ETOH withdrawal seizure    History of Present Illness:  Devin Garza is a 81 y.o. male with a PMH significant for 2 basal cell carcinoma, hypertension, hyperlipidemia, CAD, tobacco abuse, EtOH abuse, and anemia who presented to the ER 1/27 with complaints of fatigue, shortness of breath, and diarrhea that began 7 to 10 days prior to admission.  Patient also reported decreased oral intake but denies vomiting or diarrhea.  Patient was admitted per [facility] for treatment of multiple metabolic derangements including hypokalemia, hypomagnesemia hypokalemia glycemia hypocalcemia.   Mid morning of 1/29 patient had witnessed tonic-clonic seizure lasting approximately 3 minutes.  Seizure activity felt secondary to EtOH withdrawal.  He received 2 mg oral Ativan, 2 mg IM Ativan, and later 2 mg of IV Ativan with eventual cessation of seizure activity.  Post seizure patient seen post ictal with tachypnea currently protecting airway.  PCCM consulted for further management and transfer to ICU.  Neurology also consulted.  Pertinent  Medical History  Basal cell carcinoma GERD Hyperlipidemia Hypertension Osteoarthritis PVD CAD Anemia  Significant Hospital Events: Including procedures, antibiotic start and stop dates in addition to other pertinent events   1/27 admitted for failure to thrive and multiple metabolic derangements 0/93 patient had 3-minute tonic-clonic seizure felt secondary to EtOH withdrawal 1/31 increasing hypoxia with requirement of NRB  Interim History / Subjective:   Devin Garza states he feels the need to urinate but has been unable to go. He endorses abdominal distention. He denies any other complaints at this time.   Overnight, patient was noted to be bradycardic, however asymptomatic. He has one episode of fever up  to 100.8 with improvement after receiving Tylenol.   Objective   Blood pressure (!) 107/52, pulse (!) 57, temperature 97.7 F (36.5 C), temperature source Oral, resp. rate (!) 27, height 5\' 10"  (1.778 m), weight 79.3 kg, SpO2 99 %.    FiO2 (%):  [50 %-55 %] 50 %   Intake/Output Summary (Last 24 hours) at 01/28/2022 0751 Last data filed at 01/28/2022 0700 Gross per 24 hour  Intake 2611.77 ml  Output 2955 ml  Net -343.23 ml    Filed Weights   01/26/22 0630 01/27/22 0600 01/28/22 0410  Weight: 78.3 kg 78.9 kg 79.3 kg   Examination: General: Acute on chronic ill-appearing elderly male lying in bed HEENT: Moist MM  Neuro: Alert and oriented to self only. Moving all extremities. No tremor.  CV: irregularly irregular, bradycardic, no murmurs PULM:  Diffuse rhonchi throughout although improved from prior day. No increased work of breathing.  GI: soft, non-distended.  Extremities: warm/dry, no edema  Skin: no rashes or lesions  Resolved Hospital Problem list   Acute Kidney Injury  Acute rhabdomyolysis  Assessment & Plan:   # Alcohol Withdrawal Seizure # Acute Encephalopathy 2/2 Alcohol Withdrawal  Patient had 1 episode of seizure 1/29 received 2 mg of IV Ativan in the setting of alcohol withdrawal. Patient continues to be encephalopathic, question if delirium is contributing.   - Neurology following; will follow up their recommendations  - Monitor CIWA  - Continue Keppra 500 mg BID - Continue Precedex gtt. Wean as tolerated  - Continue thiamine and folate supplements  # Acute Hypoxic Respiratory Failure 2/2 Aspiration Pneumonia # Dysphagia Suspected aspiration pneumonia with some aspect of pulmonary edema as well. Oxygen requirements improving.   -  Continue supplemental oxygen PRN to maintain oxygen saturation above 88% - Continue antibiotics w/ Unasyn, Day 2  - Additional dose of Lasix today, 40 mg IV once - Given encephalopathy, high risk for intubation   # Sinus  Bradycardia # Sinus Arrhythmia  Bradycardia noted in the early hours of 2/2 with irregularity although clear P waves present. Sinus arrhythmia has been present prior to bradycardia. Patient is on Precedex, which may be contributing. He is on chronic Metoprolol but did not receive dose yesterday.   - Hold home Metoprolol - Continue telemetry monitoring  # Acute urinary retention Foley removed on 2/1.   - Continue Bethanechol - Continue Flomax  # Hypokalemia/hypophosphatemia/Hypocalcemia/Hypomagnesemia Secondary to likely dehydration in the setting of significant GI losses and poor nutrition 2/2 AUD.   - Monitor electrolytes daily and replenish PRN  # Chronic Normocytic Anemia Hemoglobin trending down during admission, however receiving significant amount of fluids. No suspicion for active bleed. Hemoglobin stable today  - Monitor hemoglobin while admitted  # Hypertension # Hyperlipidemia - Continue Norvasc and metoprolol - Holding home Diovan  - Continue Lipitor   # History of GERD - Continue Protonix  Best Practice (right click and "Reselect all SmartList Selections" daily)   Diet/type: Dysphagia 1 diet DVT prophylaxis: SCD GI prophylaxis: PPI Foley:  No Code Status:  full code Last date of multidisciplinary goals of care discussion: Pending   Labs   CBC: Recent Labs  Lab 01/22/22 2213 01/23/22 0437 01/23/22 1651 01/24/22 0229 01/26/22 0237 01/26/22 2304 01/27/22 0247 01/28/22 0121  WBC 8.6   < > 8.5 7.4 5.7  --  6.2 6.0  NEUTROABS 5.9  --   --   --  3.7  --   --   --   HGB 11.0*   < > 10.9* 10.3* 8.7* 7.5* 8.7* 8.4*  HCT 32.4*   < > 31.0* 29.1* 24.7* 22.0* 25.4* 24.9*  MCV 92.3   < > 91.4 91.5 92.2  --  92.7 92.6  PLT 243   < > 216 223 226  --  227 254   < > = values in this interval not displayed.    Basic Metabolic Panel: Recent Labs  Lab 01/24/22 0229 01/24/22 1421 01/25/22 0021 01/26/22 0237 01/26/22 1559 01/26/22 2304 01/27/22 0247  01/28/22 0121  NA 136 139 141 136  --  137 136 135  K 4.4 3.6 3.6 3.4*  --  4.2 4.2 3.5  CL 107 109 113* 108  --   --  107 102  CO2 13* 16* 14* 18*  --   --  20* 24  GLUCOSE 141* 148* 110* 140*  --   --  135* 135*  BUN 29* 22 19 14   --   --  12 12  CREATININE 1.20 1.14 1.05 0.78  --   --  0.89 1.03  CALCIUM 7.2* 7.3* 6.9* 7.1*  --   --  7.2* 7.2*  MG 1.6* 2.5* 1.7 1.2* 2.2  --  1.7 1.3*  PHOS 2.5  --  1.9* 2.5  --   --  3.3 3.4    GFR: Estimated Creatinine Clearance: 58.1 mL/min (by C-G formula based on SCr of 1.03 mg/dL). Recent Labs  Lab 01/24/22 0229 01/26/22 0237 01/27/22 0247 01/28/22 0121  WBC 7.4 5.7 6.2 6.0    Liver Function Tests: Recent Labs  Lab 01/22/22 2213  AST 67*  ALT 32  ALKPHOS 51  BILITOT 0.8  PROT 6.6  ALBUMIN 3.2*  ABG    Component Value Date/Time   PHART 7.475 (H) 01/26/2022 2304   PCO2ART 28.8 (L) 01/26/2022 2304   PO2ART 78 (L) 01/26/2022 2304   HCO3 21.3 01/26/2022 2304   TCO2 22 01/26/2022 2304   ACIDBASEDEF 2.0 01/26/2022 2304   O2SAT 97.0 01/26/2022 2304   Coagulation Profile: No results for input(s): INR, PROTIME in the last 168 hours.  Cardiac Enzymes: Recent Labs  Lab 01/23/22 1651 01/26/22 0237  CKTOTAL 2,944* 327    HbA1C: Hgb A1c MFr Bld  Date/Time Value Ref Range Status  10/16/2020 02:51 AM 5.7 (H) 4.8 - 5.6 % Final    Comment:    (NOTE)         Prediabetes: 5.7 - 6.4         Diabetes: >6.4         Glycemic control for adults with diabetes: <7.0   02/25/2020 12:52 PM 5.2 4.6 - 6.5 % Final    Comment:    Glycemic Control Guidelines for People with Diabetes:Non Diabetic:  <6%Goal of Therapy: <7%Additional Action Suggested:  >8%    Dr. Jose Persia Internal Medicine PGY-3  01/28/2022, 7:51 AM

## 2022-01-28 NOTE — Progress Notes (Signed)
Speech Language Pathology Treatment: Dysphagia  Patient Details Name: Barnie Sopko MRN: [id number] DOB: [birth year] Today's Date: 01/28/2022 Time: [id number] SLP Time Calculation (min) (ACUTE ONLY): 20 min  Assessment / Plan / Recommendation Clinical Impression  Patient seen by SLP for dysphagia treatment. SLP entered room as PT and OT were finishing up a co tx session. He was seated in recliner and awake and alert. He was not able to answer any temporal orientation questions but after moderate delay, he was able to correctly give his street address and told SLP it was "for old people". SLP observed patient with drinking thin liquids (juice). After assistance getting cup into hand, patient able to give self cup sips and consumed approximately 5-6 ounces of juice. Swallow initiation appeared functional, perhaps mildly delayed. No immediate coughing or throat clearing however patient did have dry, congested but non-productive coughing following liquid intake. SLP does not highly suspect cough response to be related to PO toleration but will continue to monitor. SLP recommending upgrade of diet to Dys 2 solids (fine chop) and thin liquids and will continue to follow patient for diet toleration and ability to advance solids.   HPI HPI: 81 y.o. male presented with ongoing weakness and fatigue. Found to have Hypocalcemia,Hypokalemia, and AKI/rhabdomyolysis. Developed alcohol withdrawal with DTs. 1/29 pt with generalized tonic clonic seizure that lasted for about 3 minutes (? ETOH withdrawl). SInce admission has been coughing intermittently with POs.  PHMx:  mild Schatzki ring (acquired) was found in the distal esophagus per endoscopy 2018; seizures, carotid artery disease, hyperlipidemia, GI bleed, gout, hypertension, anemia, basal cell carcinoma, GERD      SLP Plan  Continue with current plan of care      Recommendations for follow up therapy are one component of a multi-disciplinary discharge planning  process, led by the attending physician.  Recommendations may be updated based on patient status, additional functional criteria and insurance authorization.    Recommendations  Diet recommendations: Dysphagia 2 (fine chop);Thin liquid Liquids provided via: Cup;Straw Medication Administration: Whole meds with puree Supervision: Full supervision/cueing for compensatory strategies;Staff to assist with self feeding Compensations: Slow rate;Small sips/bites Postural Changes and/or Swallow Maneuvers: Seated upright 90 degrees                Oral Care Recommendations: Oral care BID;Staff/trained caregiver to provide oral care Follow Up Recommendations: Other (comment) (TBD) Assistance recommended at discharge: Frequent or constant Supervision/Assistance Plan: Continue with current plan of care          Sonia Baller, MA, CCC-SLP Speech Therapy

## 2022-01-29 ENCOUNTER — Inpatient Hospital Stay (HOSPITAL_COMMUNITY): Payer: Medicare Other

## 2022-01-29 ENCOUNTER — Inpatient Hospital Stay: Admission: AD | Admit: 2022-01-29 | Discharge: 2022-02-24 | Payer: Self-pay | Source: Other Acute Inpatient Hospital

## 2022-01-29 DIAGNOSIS — R4182 Altered mental status, unspecified: Secondary | ICD-10-CM | POA: Diagnosis not present

## 2022-01-29 DIAGNOSIS — Z66 Do not resuscitate: Secondary | ICD-10-CM | POA: Diagnosis not present

## 2022-01-29 DIAGNOSIS — I10 Essential (primary) hypertension: Secondary | ICD-10-CM | POA: Diagnosis not present

## 2022-01-29 DIAGNOSIS — G9340 Encephalopathy, unspecified: Secondary | ICD-10-CM | POA: Diagnosis not present

## 2022-01-29 DIAGNOSIS — E785 Hyperlipidemia, unspecified: Secondary | ICD-10-CM | POA: Diagnosis not present

## 2022-01-29 DIAGNOSIS — D649 Anemia, unspecified: Secondary | ICD-10-CM | POA: Diagnosis not present

## 2022-01-29 DIAGNOSIS — Z85828 Personal history of other malignant neoplasm of skin: Secondary | ICD-10-CM | POA: Diagnosis not present

## 2022-01-29 DIAGNOSIS — R0689 Other abnormalities of breathing: Secondary | ICD-10-CM | POA: Diagnosis not present

## 2022-01-29 DIAGNOSIS — R1310 Dysphagia, unspecified: Secondary | ICD-10-CM | POA: Diagnosis not present

## 2022-01-29 DIAGNOSIS — R410 Disorientation, unspecified: Secondary | ICD-10-CM | POA: Diagnosis not present

## 2022-01-29 DIAGNOSIS — S022XXA Fracture of nasal bones, initial encounter for closed fracture: Secondary | ICD-10-CM | POA: Diagnosis not present

## 2022-01-29 DIAGNOSIS — K219 Gastro-esophageal reflux disease without esophagitis: Secondary | ICD-10-CM | POA: Diagnosis not present

## 2022-01-29 DIAGNOSIS — J690 Pneumonitis due to inhalation of food and vomit: Secondary | ICD-10-CM | POA: Diagnosis not present

## 2022-01-29 DIAGNOSIS — R339 Retention of urine, unspecified: Secondary | ICD-10-CM | POA: Diagnosis not present

## 2022-01-29 DIAGNOSIS — F10139 Alcohol abuse with withdrawal, unspecified: Secondary | ICD-10-CM | POA: Diagnosis not present

## 2022-01-29 DIAGNOSIS — R1312 Dysphagia, oropharyngeal phase: Secondary | ICD-10-CM | POA: Diagnosis not present

## 2022-01-29 DIAGNOSIS — I739 Peripheral vascular disease, unspecified: Secondary | ICD-10-CM | POA: Diagnosis not present

## 2022-01-29 DIAGNOSIS — Z87891 Personal history of nicotine dependence: Secondary | ICD-10-CM | POA: Diagnosis not present

## 2022-01-29 DIAGNOSIS — E512 Wernicke's encephalopathy: Secondary | ICD-10-CM | POA: Diagnosis not present

## 2022-01-29 DIAGNOSIS — Z6824 Body mass index (BMI) 24.0-24.9, adult: Secondary | ICD-10-CM | POA: Diagnosis not present

## 2022-01-29 DIAGNOSIS — G9349 Other encephalopathy: Secondary | ICD-10-CM | POA: Diagnosis not present

## 2022-01-29 DIAGNOSIS — J189 Pneumonia, unspecified organism: Secondary | ICD-10-CM

## 2022-01-29 DIAGNOSIS — E46 Unspecified protein-calorie malnutrition: Secondary | ICD-10-CM | POA: Diagnosis not present

## 2022-01-29 DIAGNOSIS — J984 Other disorders of lung: Secondary | ICD-10-CM | POA: Diagnosis not present

## 2022-01-29 DIAGNOSIS — R918 Other nonspecific abnormal finding of lung field: Secondary | ICD-10-CM | POA: Diagnosis not present

## 2022-01-29 DIAGNOSIS — E440 Moderate protein-calorie malnutrition: Secondary | ICD-10-CM | POA: Diagnosis not present

## 2022-01-29 DIAGNOSIS — M109 Gout, unspecified: Secondary | ICD-10-CM | POA: Diagnosis not present

## 2022-01-29 DIAGNOSIS — F411 Generalized anxiety disorder: Secondary | ICD-10-CM | POA: Diagnosis not present

## 2022-01-29 DIAGNOSIS — J9601 Acute respiratory failure with hypoxia: Secondary | ICD-10-CM | POA: Diagnosis not present

## 2022-01-29 LAB — CBC
HCT: 25.6 % — ABNORMAL LOW (ref 39.0–52.0)
Hemoglobin: 8.4 g/dL — ABNORMAL LOW (ref 13.0–17.0)
MCH: 31 pg (ref 26.0–34.0)
MCHC: 32.8 g/dL (ref 30.0–36.0)
MCV: 94.5 fL (ref 80.0–100.0)
Platelets: 315 10*3/uL (ref 150–400)
RBC: 2.71 MIL/uL — ABNORMAL LOW (ref 4.22–5.81)
RDW: 12.3 % (ref 11.5–15.5)
WBC: 8.3 10*3/uL (ref 4.0–10.5)
nRBC: 0 % (ref 0.0–0.2)

## 2022-01-29 LAB — BASIC METABOLIC PANEL
Anion gap: 11 (ref 5–15)
BUN: 14 mg/dL (ref 8–23)
CO2: 26 mmol/L (ref 22–32)
Calcium: 7.7 mg/dL — ABNORMAL LOW (ref 8.9–10.3)
Chloride: 98 mmol/L (ref 98–111)
Creatinine, Ser: 0.95 mg/dL (ref 0.61–1.24)
GFR, Estimated: >60 mL/min (ref >60)
Glucose, Bld: 155 mg/dL — ABNORMAL HIGH (ref 70–99)
Potassium: 4 mmol/L (ref 3.5–5.1)
Sodium: 135 mmol/L (ref 135–145)

## 2022-01-29 LAB — PHOSPHORUS: Phosphorus: 2.6 mg/dL (ref 2.5–4.6)

## 2022-01-29 LAB — MAGNESIUM: Magnesium: 1.7 mg/dL (ref 1.7–2.4)

## 2022-01-29 MED ORDER — BETHANECHOL CHLORIDE 10 MG PO TABS: 10.0000 mg | ORAL_TABLET | Freq: Three times a day (TID) | ORAL | 0 refills | Status: AC

## 2022-01-29 MED ORDER — IPRATROPIUM-ALBUTEROL 0.5-2.5 (3) MG/3ML IN SOLN: 3.0000 mL | Freq: Four times a day (QID) | RESPIRATORY_TRACT | 0 refills | Status: AC | PRN

## 2022-01-29 MED ORDER — MAGNESIUM SULFATE 2 GM/50ML IV SOLN
2.0000 g | Freq: Once | INTRAVENOUS | Status: AC
  Administered 2022-01-29: 2 g via INTRAVENOUS
  Filled 2022-01-29: qty 50

## 2022-01-29 MED ORDER — THIAMINE HCL 100 MG PO TABS: 100.0000 mg | ORAL_TABLET | Freq: Every day | ORAL | 0 refills | Status: AC

## 2022-01-29 MED ORDER — LEVETIRACETAM 500 MG PO TABS
500.0000 mg | ORAL_TABLET | Freq: Two times a day (BID) | ORAL | Status: DC
  Administered 2022-01-29: 500 mg via ORAL
  Filled 2022-01-29: qty 1

## 2022-01-29 MED ORDER — AMOXICILLIN-POT CLAVULANATE 875-125 MG PO TABS: 1.0000 | ORAL_TABLET | Freq: Two times a day (BID) | ORAL | 0 refills | Status: AC

## 2022-01-29 MED ORDER — METOPROLOL TARTRATE 25 MG PO TABS: 25.0000 mg | ORAL_TABLET | Freq: Two times a day (BID) | ORAL | 0 refills | Status: AC

## 2022-01-29 MED ORDER — TAMSULOSIN HCL 0.4 MG PO CAPS: 0.4000 mg | ORAL_CAPSULE | Freq: Every day | ORAL | 0 refills | Status: AC

## 2022-01-29 MED ORDER — FOLIC ACID 1 MG PO TABS: 1.0000 mg | ORAL_TABLET | Freq: Every day | ORAL | 0 refills | Status: AC

## 2022-01-29 NOTE — Progress Notes (Signed)
Patient's diet advanced to thin liquids during day shift. Over course of night patient noted to be coughing more after drinking thin liquids. Liquids thickened to nectar thick (per previous orders) and patient doing better. Will continue to monitor.   Milford Cage, RN

## 2022-01-29 NOTE — Progress Notes (Signed)
NAME:  Devin Garza, MRN:  [id number], DOB:  1941-07-10, LOS: 6 ADMISSION DATE:  01/22/2022, CONSULTATION DATE:  01/24/2022 REFERRING MD:  Dr. Walker Shadow, CHIEF COMPLAINT:  ETOH withdrawal seizure    History of Present Illness:  Devin Garza is a 81 y.o. male with a PMH significant for 2 basal cell carcinoma, hypertension, hyperlipidemia, CAD, tobacco abuse, EtOH abuse, and anemia who presented to the ER 1/27 with complaints of fatigue, shortness of breath, and diarrhea that began 7 to 10 days prior to admission.  Patient also reported decreased oral intake but denies vomiting or diarrhea.  Patient was admitted per [facility] for treatment of multiple metabolic derangements including hypokalemia, hypomagnesemia hypokalemia glycemia hypocalcemia.   Mid morning of 1/29 patient had witnessed tonic-clonic seizure lasting approximately 3 minutes.  Seizure activity felt secondary to EtOH withdrawal.  He received 2 mg oral Ativan, 2 mg IM Ativan, and later 2 mg of IV Ativan with eventual cessation of seizure activity.  Post seizure patient seen post ictal with tachypnea currently protecting airway.  PCCM consulted for further management and transfer to ICU.  Neurology also consulted.  Pertinent  Medical History  Basal cell carcinoma GERD Hyperlipidemia Hypertension Osteoarthritis PVD CAD Anemia  Significant Hospital Events: Including procedures, antibiotic start and stop dates in addition to other pertinent events   1/27 admitted for failure to thrive and multiple metabolic derangements 7/10 patient had 3-minute tonic-clonic seizure felt secondary to EtOH withdrawal 1/31 increasing hypoxia with requirement of NRB 2/2 precedex weaned off  Interim History / Subjective:   No acute complaints this AM. Alert and oriented x 3.   Objective   Blood pressure 112/73, pulse (!) 101, temperature 98.8 F (37.1 C), temperature source Oral, resp. rate (!) 28, height 5\' 10"  (1.778 m), weight 79.4 kg, SpO2 92  %.        Intake/Output Summary (Last 24 hours) at 01/29/2022 0827 Last data filed at 01/29/2022 0700 Gross per 24 hour  Intake 2044.07 ml  Output 3010 ml  Net -965.93 ml    Filed Weights   01/27/22 0600 01/28/22 0410 01/29/22 0500  Weight: 78.9 kg 79.3 kg 79.4 kg   Examination: General: Chronically ill-appearing elderly male lying in bed, eating breakfast HEENT: Moist MM  Neuro: Alert and oriented x3. Moving all extremities. No tremor.  CV: regular rate and rhythm, no murmurs PULM:  Coarse breathe sounds but continues to be improved from day prior.  GI: soft, non-distended.  Extremities: warm/dry, no edema  Skin: no rashes or lesions  Resolved Hospital Problem list   Acute Kidney Injury  Acute rhabdomyolysis  Assessment & Plan:   # Alcohol Withdrawal Seizure # Acute Encephalopathy 2/2 Alcohol Withdrawal  Patient had 1 episode of seizure 1/29 received 2 mg of IV Ativan in the setting of alcohol withdrawal.   Discussed case with Dr. Theda Sers, Neurology. Given etiology of seizures are secondary to alcohol withdrawal, no indication for long term AED. No need for further brain imaging.   Precedex has been weaned off for 24 hours now with patient remaining stable. Mentation is improved significant and he is alert/oriented x3. No further intervention at this time. Stable for discharge to [facility].   - Continue thiamine and folate supplements  # Acute Hypoxic Respiratory Failure 2/2 Aspiration Pneumonia # Dysphagia Suspected aspiration pneumonia with some aspect of pulmonary edema as well.  Lasix given for 2 days with improvement in volume status. Suspect edema was iatrogenic in nature. Oxygen requirements continue to improve, down to 6L  now.   - Continue supplemental oxygen PRN to maintain oxygen saturation above 88% - Continue antibiotics w/ Unasyn, Day 3. Plan to complete 5 days total.   # Sinus Bradycardia # Sinus Arrhythmia  Bradycardia noted in the early hours of 2/2 with  irregularity although clear P waves present. Sinus arrhythmia has been present prior to bradycardia.  Since discontinuing Precedex and decreasing dose of Metoprolol, bradycardia has resolved and sinus arrhytmia has improved.   - Continue Metoprolol tartrate 25 mg BID. Considering transitioning back to succinate in the next 24-48 hours.  - Continue telemetry monitoring  # Acute urinary retention Foley removed on 2/1. Post-void residuals were elevated yesterday requiring in/out cath x2 however PVR today have been below 300.   - Continue Bethanechol - Continue Flomax  # Hypokalemia/hypophosphatemia/Hypocalcemia/Hypomagnesemia Secondary to likely dehydration in the setting of significant GI losses and poor nutrition 2/2 AUD.   - Monitor electrolytes daily and replenish PRN  # Chronic Normocytic Anemia Hemoglobin trending down during admission, however receiving significant amount of fluids. No suspicion for active bleed. Hemoglobin has remained stable.   - Monitor hemoglobin while admitted  # Hypertension # Hyperlipidemia - Continue Norvasc and metoprolol - Holding home Diovan  - Continue Lipitor   # History of GERD - Continue Protonix  Best Practice (right click and "Reselect all SmartList Selections" daily)   Diet/type: Dysphagia 1 diet DVT prophylaxis: SCD GI prophylaxis: PPI Foley:  No Code Status:  full code Last date of multidisciplinary goals of care discussion: Unable to reach family. Patient updated on plan.   Labs   CBC: Recent Labs  Lab 01/22/22 2213 01/23/22 0437 01/24/22 0229 01/26/22 0237 01/26/22 2304 01/27/22 0247 01/28/22 0121 01/29/22 0119  WBC 8.6   < > 7.4 5.7  --  6.2 6.0 8.3  NEUTROABS 5.9  --   --  3.7  --   --   --   --   HGB 11.0*   < > 10.3* 8.7* 7.5* 8.7* 8.4* 8.4*  HCT 32.4*   < > 29.1* 24.7* 22.0* 25.4* 24.9* 25.6*  MCV 92.3   < > 91.5 92.2  --  92.7 92.6 94.5  PLT 243   < > 223 226  --  227 254 315   < > = values in this interval not  displayed.    Basic Metabolic Panel: Recent Labs  Lab 01/25/22 0021 01/26/22 0237 01/26/22 1559 01/26/22 2304 01/27/22 0247 01/28/22 0121 01/28/22 1932 01/29/22 0119  NA 141 136  --  137 136 135  --  135  K 3.6 3.4*  --  4.2 4.2 3.5  --  4.0  CL 113* 108  --   --  107 102  --  98  CO2 14* 18*  --   --  20* 24  --  26  GLUCOSE 110* 140*  --   --  135* 135*  --  155*  BUN 19 14  --   --  12 12  --  14  CREATININE 1.05 0.78  --   --  0.89 1.03  --  0.95  CALCIUM 6.9* 7.1*  --   --  7.2* 7.2*  --  7.7*  MG 1.7 1.2* 2.2  --  1.7 1.3* 2.1 1.7  PHOS 1.9* 2.5  --   --  3.3 3.4  --  2.6    GFR: Estimated Creatinine Clearance: 63 mL/min (by C-G formula based on SCr of 0.95 mg/dL). Recent Labs  Lab 01/26/22  5462 01/27/22 0247 01/28/22 0121 01/29/22 0119  WBC 5.7 6.2 6.0 8.3    Liver Function Tests: Recent Labs  Lab 01/22/22 2213  AST 67*  ALT 32  ALKPHOS 51  BILITOT 0.8  PROT 6.6  ALBUMIN 3.2*    ABG    Component Value Date/Time   PHART 7.475 (H) 01/26/2022 2304   PCO2ART 28.8 (L) 01/26/2022 2304   PO2ART 78 (L) 01/26/2022 2304   HCO3 21.3 01/26/2022 2304   TCO2 22 01/26/2022 2304   ACIDBASEDEF 2.0 01/26/2022 2304   O2SAT 97.0 01/26/2022 2304   Coagulation Profile: No results for input(s): INR, PROTIME in the last 168 hours.  Cardiac Enzymes: Recent Labs  Lab 01/23/22 1651 01/26/22 0237  CKTOTAL 2,944* 327    HbA1C: Hgb A1c MFr Bld  Date/Time Value Ref Range Status  10/16/2020 02:51 AM 5.7 (H) 4.8 - 5.6 % Final    Comment:    (NOTE)         Prediabetes: 5.7 - 6.4         Diabetes: >6.4         Glycemic control for adults with diabetes: <7.0   02/25/2020 12:52 PM 5.2 4.6 - 6.5 % Final    Comment:    Glycemic Control Guidelines for People with Diabetes:Non Diabetic:  <6%Goal of Therapy: <7%Additional Action Suggested:  >8%    Dr. Jose Persia Internal Medicine PGY-3  01/29/2022, 8:27 AM

## 2022-01-29 NOTE — Discharge Summary (Addendum)
Physician Discharge Summary         Patient ID: Devin Garza MRN: [id number] DOB/AGE: 1941/02/06 81 y.o.  Admit date: 01/22/2022 Discharge date: 01/29/2022  Discharge Diagnoses:    Alcohol Withdrawal c/b seizures Alcohol Use Disorder  Acute Hypoxic Respiratory Failure 2/2 Aspiration Pneumonia Dysphagia  Electrolyte Imbalances Acute Urinary Retention Sinus Bradycardia Sinus arrhythmia  Chronic Normocytic Anemia Hypertension Hyperlipidemia GERD  Discharge summary    Joaquin Knebel is a 81 y.o. male with a PMH significant for 2 basal cell carcinoma, hypertension, hyperlipidemia, CAD, tobacco abuse, EtOH abuse, and anemia who presented to the ER 1/27 with complaints of fatigue, shortness of breath, and diarrhea that began 7 to 10 days prior to admission.  Patient also reported decreased oral intake but denies vomiting or diarrhea.   Patient was admitted per [facility] for treatment of multiple metabolic derangements including hypokalemia, hypomagnesemia hypokalemia glycemia hypocalcemia.    Mid morning of 1/29 patient had witnessed tonic-clonic seizure lasting approximately 3 minutes.  Seizure activity felt secondary to EtOH withdrawal.  He received 2 mg oral Ativan, 2 mg IM Ativan, and later 2 mg of IV Ativan with eventual cessation of seizure activity.  Post seizure patient seen post ictal with tachypnea currently protecting airway.  PCCM consulted for further management and transfer to ICU.  Neurology also consulted.  Discharge Plan by Active Problems    # Alcohol Withdrawal Seizure # Acute Encephalopathy 2/2 Alcohol Withdrawal  Patient had 1 episode of seizure 1/29 received 2 mg of IV Ativan in the setting of alcohol withdrawal.    Discussed case with Dr. Theda Sers, Neurology. Given etiology of seizures are secondary to alcohol withdrawal, no indication for long term AED. No need for further brain imaging.    Precedex has been weaned off for 24 hours now with patient remaining stable.  Mentation is improved significant and he is alert/oriented x3. No further intervention at this time. Stable for discharge to [facility].   - Strongly encouraged patient to consider future alcohol rehabilitation.  - Continue daily thiamine and folate supplements - Continue to hold Wellbutrin given risk for lowering seizure threshold.    # Acute Hypoxic Respiratory Failure 2/2 Aspiration Pneumonia Suspected aspiration pneumonia with some aspect of pulmonary edema as well.  Lasix given for 2 days with improvement in volume status. Suspect edema was iatrogenic in nature. Oxygen requirements continue to improve, down to 6L now.    - Continue supplemental oxygen PRN to maintain oxygen saturation above 88% - Continue antibiotics however will transition to oral Augmentin this afternoon. Plan to complete 5 days total. Last dose PM on 01/31/22.   # Dysphagia Evaluated by SLP with recommendations for Dysphagia 2 diet. Continued work up with MBS planned.    # Sinus Bradycardia # Sinus Arrhythmia  Bradycardia noted in the early hours of 2/2 with irregularity although clear P waves present. Sinus arrhythmia has been present prior to bradycardia.  Since discontinuing Precedex and decreasing dose of Metoprolol, bradycardia has resolved and sinus arrhytmia has improved.    - Continue Metoprolol tartrate 25 mg BID. Considering transitioning back to succinate in the next 24-48 hours.    # Acute urinary retention Foley removed on 2/1. Post-void residuals were elevated yesterday requiring in/out cath x2 however PVR today have been below 300.    - Continue Bethanechol - Continue Flomax   # Hypokalemia/hypophosphatemia/Hypocalcemia/Hypomagnesemia Secondary to poor PO intake and poor nutrition 2/2 AUD.    - Monitor electrolytes and replenish PRN   # Chronic Normocytic  Anemia Hemoglobin trending down during admission, however receiving significant amount of fluids. No suspicion for active bleed. Hemoglobin has  remained stable.   # Hypertension # Hyperlipidemia - Continue Norvasc and metoprolol - Holding home Diovan given normotension on current regimen. Can add back if room in blood pressure.  - Continue Lipitor    # History of GERD - Continue Protonix   Significant Hospital tests/ studies    Procedures    EEG IMPRESSION: This study is within normal limits. No seizures or epileptiform discharges were seen throughout the recording. The excessive beta activity seen in the background is most likely due to the effect of benzodiazepine and is a benign EEG pattern.  Culture data/antimicrobials    COVID-19 and Influenza: Negative  C. Diff and GI panel: Negative  MRSA nasal swab: Negative    Consults  Neuro   Discharge Exam: BP (!) 142/73    Pulse (!) 101    Temp 99.2 F (37.3 C) (Oral)    Resp (!) 27    Ht 5\' 10"  (1.778 m)    Wt 79.4 kg    SpO2 100%    BMI 25.12 kg/m  General: Chronically ill-appearing elderly male lying in bed, eating breakfast HEENT: Moist MM  Neuro: Alert and oriented x3. Moving all extremities. No tremor.  CV: regular rate and rhythm, no murmurs PULM:  Coarse breathe sounds but continues to be improved from day prior.  GI: soft, non-distended.  Extremities: warm/dry, no edema  Skin: no rashes or lesions  Labs at discharge   Lab Results  Component Value Date   CREATININE 0.95 01/29/2022   BUN 14 01/29/2022   NA 135 01/29/2022   K 4.0 01/29/2022   CL 98 01/29/2022   CO2 26 01/29/2022   Lab Results  Component Value Date   WBC 8.3 01/29/2022   HGB 8.4 (L) 01/29/2022   HCT 25.6 (L) 01/29/2022   MCV 94.5 01/29/2022   PLT 315 01/29/2022   Lab Results  Component Value Date   ALT 32 01/22/2022   AST 67 (H) 01/22/2022   ALKPHOS 51 01/22/2022   BILITOT 0.8 01/22/2022   Lab Results  Component Value Date   INR 1.2 10/15/2020   INR 1.0 ratio 12/12/2009    Current radiological studies    DG Chest Port 1 View CLINICAL DATA:  Shortness of breath,  hypoxia  EXAM: PORTABLE CHEST 1 VIEW  COMPARISON:  01/22/2022  FINDINGS: New right greater than left opacities are present. No significant pleural effusion. No pneumothorax. Similar cardiomediastinal contours.  IMPRESSION: New right greater than left opacities. Favor edema with pneumonia also possible in the appropriate setting.  Electronically Signed   By: Macy Mis M.D.   On: 01/27/2022 09:05    Disposition:    Discharge disposition: 03-Skilled Nursing Facility      Discharge Instructions     Call MD for:  difficulty breathing, headache or visual disturbances   Complete by: As directed    Call MD for:  extreme fatigue   Complete by: As directed    Call MD for:  hives   Complete by: As directed    Call MD for:  persistant dizziness or light-headedness   Complete by: As directed    Call MD for:  persistant nausea and vomiting   Complete by: As directed    Call MD for:  severe uncontrolled pain   Complete by: As directed    Call MD for:  temperature >100.4   Complete by:  As directed    Diet - low sodium heart healthy   Complete by: As directed    Discharge instructions   Complete by: As directed    Mr. Eisenmenger,   You were admitted to the hospital after developing alcohol withdrawal, which was complicated by seizures related to alcohol withdrawal. It is extremely important to quit drinking alcohol to help prevent this from occurring again. If you need resources, please reach out to your primary care doctor.   It was a pleasure meeting you and we wish you the best.   Sincerely, Dr. Charleen Kirks   Increase activity slowly   Complete by: As directed    No wound care   Complete by: As directed        Allergies as of 01/29/2022   No Known Allergies      Medication List     STOP taking these medications    buPROPion 150 MG 24 hr tablet Commonly known as: WELLBUTRIN XL   Diovan HCT 320-12.5 MG tablet Generic drug: valsartan-hydrochlorothiazide    eszopiclone 2 MG Tabs tablet Commonly known as: LUNESTA   metoprolol succinate 100 MG 24 hr tablet Commonly known as: TOPROL-XL       TAKE these medications    amLODipine 10 MG tablet Commonly known as: NORVASC Take 1 tablet (10 mg total) by mouth daily.   amoxicillin-clavulanate 875-125 MG tablet Commonly known as: Augmentin Take 1 tablet by mouth every 12 (twelve) hours for 5 doses.   atorvastatin 10 MG tablet Commonly known as: LIPITOR Take 1 tablet (10 mg total) by mouth daily.   bethanechol 10 MG tablet Commonly known as: URECHOLINE Take 1 tablet (10 mg total) by mouth 3 (three) times daily.   colchicine 0.6 MG tablet TAKE 1 TABLET BY MOUTH 2 TIMES DAILY.   Diclofenac Sodium 1.5 % Soln Apply 1 mL topically 4 (four) times daily as needed. What changed: reasons to take this   esomeprazole 20 MG capsule Commonly known as: NexIUM 24HR Take 2 capsules (40 mg total) by mouth 2 (two) times daily before a meal.   folic acid 1 MG tablet Commonly known as: FOLVITE Take 1 tablet (1 mg total) by mouth daily. Start taking on: January 30, 2022   ipratropium-albuterol 0.5-2.5 (3) MG/3ML Soln Commonly known as: DUONEB Take 3 mLs by nebulization every 6 (six) hours as needed.   loratadine 10 MG tablet Commonly known as: CLARITIN Take 1 tablet (10 mg total) by mouth daily as needed for allergies.   magnesium oxide 400 MG tablet Commonly known as: MAG-OX Take 1 tablet by mouth morning, noon and at bedtime   metoprolol tartrate 25 MG tablet Commonly known as: LOPRESSOR Take 1 tablet (25 mg total) by mouth 2 (two) times daily.   multivitamin tablet Take 1 tablet by mouth daily.   tamsulosin 0.4 MG Caps capsule Commonly known as: FLOMAX Take 1 capsule (0.4 mg total) by mouth daily after supper.   thiamine 100 MG tablet Take 1 tablet (100 mg total) by mouth daily. Start taking on: January 30, 2022         Follow-up appointment   Follow up with PCP in 1  week  Discharge Condition:   Fair  Signed: Dr. Jose Persia Internal Medicine PGY-3   01/29/2022, 11:56 AM  PCCM attending:  81 year old gentleman admitted for agitated delirium seizure presumed related to alcohol withdrawal.  Has aspiration pneumonia.  Now mental status much improved off Precedex no longer requiring as needed Ativan.  Following commands.  Still low-grade fevers on antibiotics.  BP 112/73    Pulse (!) 101    Temp 98.8 F (37.1 C) (Oral)    Resp (!) 28    Ht 5\' 10"  (1.778 m)    Wt 79.4 kg    SpO2 92%    BMI 25.12 kg/m   General: Elderly male calm resting in bed no distress on 6 L HEENT: Tracking appropriately, following commands Neuro: Alert and oriented times person and place. Abdomen: Mildly distended Heart: Regular rhythm S1-S2 Lungs: Clear to auscultation, no crackles  Labs: Reviewed  Assessment: Acute delirium, resolved Acute alcohol withdrawal, resolved Seizure, secondary to above, resolved Acute hypoxemic respiratory failure, improving on 6 L secondary to aspiration pneumonia Acute renal failure resolved Urinary retention, In-N-Out as needed Hypomagnesemia Bradycardia related to medication, resolved  Plan: No keppra needed per neuro recs  Can have outpatient follow-up with neurology regarding seizure Needs alcohol cessation Complete 7 days of Unasyn Lungs were able to progress his speech and swallow needs may be able to moved Augmentin. Continued Lasix to maintain euvolemia Correct electrolytes  Stable for transfer from intensive care unit.  [facility] 01/29/2022 9:01 AM

## 2022-01-29 NOTE — Progress Notes (Signed)
Attempted to contact patient's sister to provide an update. VM left.

## 2022-01-29 NOTE — Progress Notes (Signed)
[facility] ADULT ICU REPLACEMENT PROTOCOL   The patient does apply for the [facility] Adult ICU Electrolyte Replacment Protocol based on the criteria listed below:   1.Exclusion criteria: TCTS patients, ECMO patients, and Dialysis patients 2. Is GFR >/= 30 ml/min? Yes.    Patient's GFR today is >60 3. Is SCr </= 2? Yes.   Patient's SCr is 0.95 mg/dL 4. Did SCr increase >/= 0.5 in 24 hours? No. 5.Pt's weight >40kg  Yes.   6. Abnormal electrolyte(s):  Mg 1.7  7. Electrolytes replaced per protocol 8.  Call MD STAT for K+ </= 2.5, Phos </= 1, or Mag </= 1 Physician:  Lowella Bandy R Maymuna Detzel 01/29/2022 6:05 AM

## 2022-01-29 NOTE — Progress Notes (Signed)
Speech Language Pathology Treatment: Dysphagia  Patient Details Name: Devin Garza MRN: [id number] DOB: 02-May-1941 Today's Date: 01/29/2022 Time: [id number] SLP Time Calculation (min) (ACUTE ONLY): 14 min  Assessment / Plan / Recommendation Clinical Impression  Pt had been advanced to thin liquids on previous date by SLP, but overnight was changed back to nectar thick liquids by other staff dur to concerns about coughing during PO intake. He is alert and cooperative in the room and following commands well, but does not understand why he would be on thickened liquids. He asked several times for a chemist to help him determine their purpose. SLP provided education on the rationale for their use but did provide thin liquids trials. Despite assistance from SLP as needed for feeding, pt does still have immediate coughing s/p sips of thin liquids. This is reduced with cues for slower pacing, but not eliminated. Given ongoing concern for s/s of aspiration despite mentation gradually improving, recommend proceeding with MBS to better evaluate oropharyngeal function. Although this was tentatively scheduled for this afternoon, notified by RN that pt is discharging to [facility]. Would maintain Dys 2 diet and nectar thick liquids for now with consideration for MBS at that level of care.    HPI HPI: 81 y.o. male presented with ongoing weakness and fatigue. Found to have Hypocalcemia,Hypokalemia, and AKI/rhabdomyolysis. Developed alcohol withdrawal with DTs. 1/29 pt with generalized tonic clonic seizure that lasted for about 3 minutes (? ETOH withdrawl). SInce admission has been coughing intermittently with POs.  PHMx:  mild Schatzki ring (acquired) was found in the distal esophagus per endoscopy 2018; seizures, carotid artery disease, hyperlipidemia, GI bleed, gout, hypertension, anemia, basal cell carcinoma, GERD      SLP Plan  MBS      Recommendations for follow up therapy are one component of a multi-disciplinary  discharge planning process, led by the attending physician.  Recommendations may be updated based on patient status, additional functional criteria and insurance authorization.    Recommendations  Diet recommendations: Dysphagia 2 (fine chop);Nectar-thick liquid Liquids provided via: Cup;Straw Medication Administration: Whole meds with puree Supervision: Full supervision/cueing for compensatory strategies;Staff to assist with self feeding Compensations: Slow rate;Small sips/bites Postural Changes and/or Swallow Maneuvers: Seated upright 90 degrees                Oral Care Recommendations: Oral care BID;Staff/trained caregiver to provide oral care Follow Up Recommendations: Skilled nursing-short term rehab (<3 hours/day) Assistance recommended at discharge: Frequent or constant Supervision/Assistance SLP Visit Diagnosis: Dysphagia, unspecified (R13.10) Plan: MBS           Osie Bond., M.A. [place] [facility] Pager [phone] Office [phone]  01/29/2022, 12:00 PM

## 2022-01-29 NOTE — TOC Transition Note (Signed)
Transition of Care [facility]) - CM/SW Discharge Note   Patient Details  Name: Devin Garza MRN: [id number] Date of Birth: [birth year]  Transition of Care [facility]) CM/SW Contact:  Milinda Antis, [place] Phone Number: 01/29/2022, 12:41 PM   Clinical Narrative:    Patient will DC to: [facility] Anticipated DC date: 01/29/2022 Family notified: Yes Transport by: RN   Per MD patient ready for DC to [facility]. RN to call report prior to discharge [phone]. RN, patient, patient's family, and facility notified of DC.   CSW will sign off for now as social work intervention is no longer needed. Please consult [place] again if new needs arise.     Final next level of care: Long Term Acute Care (LTAC) Barriers to Discharge: No Barriers Identified   Patient Goals and CMS Choice Patient states their goals for this hospitalization and ongoing recovery are:: To get back to [facility].gov Compare Post Acute Care list provided to:: Patient Choice offered to / list presented to : Patient  Discharge Placement              Patient chooses bed at:  ([facility])   Name of family member notified: Mein,Jill Sister [phone] Patient and family notified of of transfer: 01/29/22  Discharge Plan and Services                                     Social Determinants of Health (SDOH) Interventions     Readmission Risk Interventions Readmission Risk Prevention Plan 10/21/2020  Transportation Screening Complete  PCP or Specialist Appt within 3-5 Days Complete  HRI or [place] Complete  Social Work Consult for [place] Planning/Counseling Complete  Palliative Care Screening Not Applicable  Medication Review [employer] photographer) Complete  Some recent data might be hidden

## 2022-01-30 LAB — COMPREHENSIVE METABOLIC PANEL
ALT: 18 U/L (ref 0–44)
AST: 21 U/L (ref 15–41)
Albumin: 2.3 g/dL — ABNORMAL LOW (ref 3.5–5.0)
Alkaline Phosphatase: 55 U/L (ref 38–126)
Anion gap: 11 (ref 5–15)
BUN: 9 mg/dL (ref 8–23)
CO2: 24 mmol/L (ref 22–32)
Calcium: 8.3 mg/dL — ABNORMAL LOW (ref 8.9–10.3)
Chloride: 99 mmol/L (ref 98–111)
Creatinine, Ser: 0.91 mg/dL (ref 0.61–1.24)
GFR, Estimated: >60 mL/min (ref >60)
Glucose, Bld: 140 mg/dL — ABNORMAL HIGH (ref 70–99)
Potassium: 3.8 mmol/L (ref 3.5–5.1)
Sodium: 134 mmol/L — ABNORMAL LOW (ref 135–145)
Total Bilirubin: 0.4 mg/dL (ref 0.3–1.2)
Total Protein: 5.9 g/dL — ABNORMAL LOW (ref 6.5–8.1)

## 2022-01-30 LAB — CBC WITH DIFFERENTIAL/PLATELET
Abs Immature Granulocytes: 0.04 10*3/uL (ref 0.00–0.07)
Basophils Absolute: 0 10*3/uL (ref 0.0–0.1)
Basophils Relative: 0 %
Eosinophils Absolute: 0.1 10*3/uL (ref 0.0–0.5)
Eosinophils Relative: 1 %
HCT: 26.2 % — ABNORMAL LOW (ref 39.0–52.0)
Hemoglobin: 8.9 g/dL — ABNORMAL LOW (ref 13.0–17.0)
Immature Granulocytes: 0 %
Lymphocytes Relative: 9 %
Lymphs Abs: 0.8 10*3/uL (ref 0.7–4.0)
MCH: 31.6 pg (ref 26.0–34.0)
MCHC: 34 g/dL (ref 30.0–36.0)
MCV: 92.9 fL (ref 80.0–100.0)
Monocytes Absolute: 1.3 10*3/uL — ABNORMAL HIGH (ref 0.1–1.0)
Monocytes Relative: 15 %
Neutro Abs: 6.6 10*3/uL (ref 1.7–7.7)
Neutrophils Relative %: 75 %
Platelets: 350 10*3/uL (ref 150–400)
RBC: 2.82 MIL/uL — ABNORMAL LOW (ref 4.22–5.81)
RDW: 12 % (ref 11.5–15.5)
WBC: 8.9 10*3/uL (ref 4.0–10.5)
nRBC: 0 % (ref 0.0–0.2)

## 2022-02-02 ENCOUNTER — Other Ambulatory Visit (HOSPITAL_COMMUNITY): Payer: Self-pay

## 2022-02-02 LAB — BASIC METABOLIC PANEL
Anion gap: 11 (ref 5–15)
BUN: 26 mg/dL — ABNORMAL HIGH (ref 8–23)
CO2: 23 mmol/L (ref 22–32)
Calcium: 9 mg/dL (ref 8.9–10.3)
Chloride: 102 mmol/L (ref 98–111)
Creatinine, Ser: 0.78 mg/dL (ref 0.61–1.24)
GFR, Estimated: >60 mL/min (ref >60)
Glucose, Bld: 134 mg/dL — ABNORMAL HIGH (ref 70–99)
Potassium: 3.6 mmol/L (ref 3.5–5.1)
Sodium: 136 mmol/L (ref 135–145)

## 2022-02-02 LAB — CBC
HCT: 30.6 % — ABNORMAL LOW (ref 39.0–52.0)
Hemoglobin: 10 g/dL — ABNORMAL LOW (ref 13.0–17.0)
MCH: 30.3 pg (ref 26.0–34.0)
MCHC: 32.7 g/dL (ref 30.0–36.0)
MCV: 92.7 fL (ref 80.0–100.0)
Platelets: 528 10*3/uL — ABNORMAL HIGH (ref 150–400)
RBC: 3.3 MIL/uL — ABNORMAL LOW (ref 4.22–5.81)
RDW: 11.9 % (ref 11.5–15.5)
WBC: 7 10*3/uL (ref 4.0–10.5)
nRBC: 0 % (ref 0.0–0.2)

## 2022-02-02 LAB — MAGNESIUM: Magnesium: 1.6 mg/dL — ABNORMAL LOW (ref 1.7–2.4)

## 2022-02-04 LAB — BASIC METABOLIC PANEL
Anion gap: 11 (ref 5–15)
BUN: 19 mg/dL (ref 8–23)
CO2: 22 mmol/L (ref 22–32)
Calcium: 9.2 mg/dL (ref 8.9–10.3)
Chloride: 105 mmol/L (ref 98–111)
Creatinine, Ser: 0.79 mg/dL (ref 0.61–1.24)
GFR, Estimated: >60 mL/min (ref >60)
Glucose, Bld: 122 mg/dL — ABNORMAL HIGH (ref 70–99)
Potassium: 3.8 mmol/L (ref 3.5–5.1)
Sodium: 138 mmol/L (ref 135–145)

## 2022-02-04 LAB — CBC
HCT: 33 % — ABNORMAL LOW (ref 39.0–52.0)
Hemoglobin: 11.3 g/dL — ABNORMAL LOW (ref 13.0–17.0)
MCH: 31.7 pg (ref 26.0–34.0)
MCHC: 34.2 g/dL (ref 30.0–36.0)
MCV: 92.4 fL (ref 80.0–100.0)
Platelets: 589 10*3/uL — ABNORMAL HIGH (ref 150–400)
RBC: 3.57 MIL/uL — ABNORMAL LOW (ref 4.22–5.81)
RDW: 11.9 % (ref 11.5–15.5)
WBC: 10.2 10*3/uL (ref 4.0–10.5)
nRBC: 0 % (ref 0.0–0.2)

## 2022-02-04 LAB — MAGNESIUM: Magnesium: 1.7 mg/dL (ref 1.7–2.4)

## 2022-02-07 LAB — COMPREHENSIVE METABOLIC PANEL
ALT: 33 U/L (ref 0–44)
AST: 23 U/L (ref 15–41)
Albumin: 3.1 g/dL — ABNORMAL LOW (ref 3.5–5.0)
Alkaline Phosphatase: 90 U/L (ref 38–126)
Anion gap: 12 (ref 5–15)
BUN: 20 mg/dL (ref 8–23)
CO2: 20 mmol/L — ABNORMAL LOW (ref 22–32)
Calcium: 9.2 mg/dL (ref 8.9–10.3)
Chloride: 114 mmol/L — ABNORMAL HIGH (ref 98–111)
Creatinine, Ser: 0.89 mg/dL (ref 0.61–1.24)
GFR, Estimated: >60 mL/min (ref >60)
Glucose, Bld: 175 mg/dL — ABNORMAL HIGH (ref 70–99)
Potassium: 3.5 mmol/L (ref 3.5–5.1)
Sodium: 146 mmol/L — ABNORMAL HIGH (ref 135–145)
Total Bilirubin: 0.5 mg/dL (ref 0.3–1.2)
Total Protein: 6.6 g/dL (ref 6.5–8.1)

## 2022-02-07 LAB — MAGNESIUM: Magnesium: 1.5 mg/dL — ABNORMAL LOW (ref 1.7–2.4)

## 2022-02-07 LAB — CBC
HCT: 33.7 % — ABNORMAL LOW (ref 39.0–52.0)
Hemoglobin: 10.9 g/dL — ABNORMAL LOW (ref 13.0–17.0)
MCH: 30.7 pg (ref 26.0–34.0)
MCHC: 32.3 g/dL (ref 30.0–36.0)
MCV: 94.9 fL (ref 80.0–100.0)
Platelets: 626 10*3/uL — ABNORMAL HIGH (ref 150–400)
RBC: 3.55 MIL/uL — ABNORMAL LOW (ref 4.22–5.81)
RDW: 12.8 % (ref 11.5–15.5)
WBC: 9.5 10*3/uL (ref 4.0–10.5)
nRBC: 0 % (ref 0.0–0.2)

## 2022-02-07 LAB — PHOSPHORUS: Phosphorus: 4 mg/dL (ref 2.5–4.6)

## 2022-02-08 LAB — MAGNESIUM: Magnesium: 1.4 mg/dL — ABNORMAL LOW (ref 1.7–2.4)

## 2022-02-09 LAB — CBC
HCT: 32.4 % — ABNORMAL LOW (ref 39.0–52.0)
Hemoglobin: 10.6 g/dL — ABNORMAL LOW (ref 13.0–17.0)
MCH: 31.4 pg (ref 26.0–34.0)
MCHC: 32.7 g/dL (ref 30.0–36.0)
MCV: 95.9 fL (ref 80.0–100.0)
Platelets: 493 10*3/uL — ABNORMAL HIGH (ref 150–400)
RBC: 3.38 MIL/uL — ABNORMAL LOW (ref 4.22–5.81)
RDW: 13.1 % (ref 11.5–15.5)
WBC: 13.7 10*3/uL — ABNORMAL HIGH (ref 4.0–10.5)
nRBC: 0 % (ref 0.0–0.2)

## 2022-02-09 LAB — MAGNESIUM: Magnesium: 1.2 mg/dL — ABNORMAL LOW (ref 1.7–2.4)

## 2022-02-09 LAB — BASIC METABOLIC PANEL
Anion gap: 9 (ref 5–15)
BUN: 12 mg/dL (ref 8–23)
CO2: 23 mmol/L (ref 22–32)
Calcium: 8.8 mg/dL — ABNORMAL LOW (ref 8.9–10.3)
Chloride: 115 mmol/L — ABNORMAL HIGH (ref 98–111)
Creatinine, Ser: 0.85 mg/dL (ref 0.61–1.24)
GFR, Estimated: >60 mL/min (ref >60)
Glucose, Bld: 143 mg/dL — ABNORMAL HIGH (ref 70–99)
Potassium: 3.2 mmol/L — ABNORMAL LOW (ref 3.5–5.1)
Sodium: 147 mmol/L — ABNORMAL HIGH (ref 135–145)

## 2022-02-10 ENCOUNTER — Other Ambulatory Visit (HOSPITAL_COMMUNITY): Payer: Self-pay

## 2022-02-10 LAB — CBC
HCT: 29.8 % — ABNORMAL LOW (ref 39.0–52.0)
Hemoglobin: 9.6 g/dL — ABNORMAL LOW (ref 13.0–17.0)
MCH: 30.6 pg (ref 26.0–34.0)
MCHC: 32.2 g/dL (ref 30.0–36.0)
MCV: 94.9 fL (ref 80.0–100.0)
Platelets: 368 10*3/uL (ref 150–400)
RBC: 3.14 MIL/uL — ABNORMAL LOW (ref 4.22–5.81)
RDW: 13 % (ref 11.5–15.5)
WBC: 7 10*3/uL (ref 4.0–10.5)
nRBC: 0 % (ref 0.0–0.2)

## 2022-02-10 LAB — COMPREHENSIVE METABOLIC PANEL
ALT: 25 U/L (ref 0–44)
AST: 26 U/L (ref 15–41)
Albumin: 2.5 g/dL — ABNORMAL LOW (ref 3.5–5.0)
Alkaline Phosphatase: 72 U/L (ref 38–126)
Anion gap: 10 (ref 5–15)
BUN: 12 mg/dL (ref 8–23)
CO2: 21 mmol/L — ABNORMAL LOW (ref 22–32)
Calcium: 8.5 mg/dL — ABNORMAL LOW (ref 8.9–10.3)
Chloride: 115 mmol/L — ABNORMAL HIGH (ref 98–111)
Creatinine, Ser: 0.81 mg/dL (ref 0.61–1.24)
GFR, Estimated: >60 mL/min (ref >60)
Glucose, Bld: 119 mg/dL — ABNORMAL HIGH (ref 70–99)
Potassium: 3 mmol/L — ABNORMAL LOW (ref 3.5–5.1)
Sodium: 146 mmol/L — ABNORMAL HIGH (ref 135–145)
Total Bilirubin: 0.6 mg/dL (ref 0.3–1.2)
Total Protein: 5.6 g/dL — ABNORMAL LOW (ref 6.5–8.1)

## 2022-02-10 LAB — URINALYSIS, ROUTINE W REFLEX MICROSCOPIC
Bilirubin Urine: NEGATIVE
Glucose, UA: NEGATIVE mg/dL
Hgb urine dipstick: NEGATIVE
Ketones, ur: 20 mg/dL — AB
Leukocytes,Ua: NEGATIVE
Nitrite: NEGATIVE
Protein, ur: NEGATIVE mg/dL
Specific Gravity, Urine: 1.018 (ref 1.005–1.030)
pH: 5 (ref 5.0–8.0)

## 2022-02-10 LAB — PHOSPHORUS: Phosphorus: 3.6 mg/dL (ref 2.5–4.6)

## 2022-02-10 LAB — MAGNESIUM: Magnesium: 1.8 mg/dL (ref 1.7–2.4)

## 2022-02-11 LAB — CBC
HCT: 32.4 % — ABNORMAL LOW (ref 39.0–52.0)
Hemoglobin: 10.5 g/dL — ABNORMAL LOW (ref 13.0–17.0)
MCH: 31 pg (ref 26.0–34.0)
MCHC: 32.4 g/dL (ref 30.0–36.0)
MCV: 95.6 fL (ref 80.0–100.0)
Platelets: 439 10*3/uL — ABNORMAL HIGH (ref 150–400)
RBC: 3.39 MIL/uL — ABNORMAL LOW (ref 4.22–5.81)
RDW: 13.2 % (ref 11.5–15.5)
WBC: 9 10*3/uL (ref 4.0–10.5)
nRBC: 0 % (ref 0.0–0.2)

## 2022-02-11 LAB — BASIC METABOLIC PANEL
Anion gap: 12 (ref 5–15)
BUN: 14 mg/dL (ref 8–23)
CO2: 20 mmol/L — ABNORMAL LOW (ref 22–32)
Calcium: 9.1 mg/dL (ref 8.9–10.3)
Chloride: 116 mmol/L — ABNORMAL HIGH (ref 98–111)
Creatinine, Ser: 0.94 mg/dL (ref 0.61–1.24)
GFR, Estimated: >60 mL/min (ref >60)
Glucose, Bld: 139 mg/dL — ABNORMAL HIGH (ref 70–99)
Potassium: 3.7 mmol/L (ref 3.5–5.1)
Sodium: 148 mmol/L — ABNORMAL HIGH (ref 135–145)

## 2022-02-11 LAB — URINE CULTURE: Culture: NO GROWTH

## 2022-02-11 LAB — MAGNESIUM: Magnesium: 1.7 mg/dL (ref 1.7–2.4)

## 2022-02-13 LAB — MAGNESIUM: Magnesium: 1.7 mg/dL (ref 1.7–2.4)

## 2022-02-14 LAB — MAGNESIUM: Magnesium: 1.9 mg/dL (ref 1.7–2.4)

## 2022-02-14 LAB — BASIC METABOLIC PANEL
Anion gap: 12 (ref 5–15)
BUN: 21 mg/dL (ref 8–23)
CO2: 21 mmol/L — ABNORMAL LOW (ref 22–32)
Calcium: 8.8 mg/dL — ABNORMAL LOW (ref 8.9–10.3)
Chloride: 111 mmol/L (ref 98–111)
Creatinine, Ser: 0.84 mg/dL (ref 0.61–1.24)
GFR, Estimated: >60 mL/min (ref >60)
Glucose, Bld: 125 mg/dL — ABNORMAL HIGH (ref 70–99)
Potassium: 3.5 mmol/L (ref 3.5–5.1)
Sodium: 144 mmol/L (ref 135–145)

## 2022-02-15 ENCOUNTER — Ambulatory Visit: Payer: Medicare Other | Admitting: Podiatry

## 2022-02-15 LAB — BLOOD GAS, ARTERIAL
Acid-Base Excess: 4.3 mmol/L — ABNORMAL HIGH (ref 0.0–2.0)
Bicarbonate: 27.1 mmol/L (ref 20.0–28.0)
FIO2: 21 %
O2 Saturation: 97.8 %
Patient temperature: 37
pCO2 arterial: 34 mmHg (ref 32–48)
pH, Arterial: 7.51 — ABNORMAL HIGH (ref 7.35–7.45)
pO2, Arterial: 81 mmHg — ABNORMAL LOW (ref 83–108)

## 2022-02-16 ENCOUNTER — Other Ambulatory Visit (HOSPITAL_COMMUNITY): Payer: Self-pay

## 2022-02-16 IMAGING — CT CT HEAD W/O CM
3 series · 17 of 37 positions shown, 19 images · non-contrast
Comparison: [DATE]

CLINICAL DATA: Altered mental status.



[Series 3: head bone · axial · 0.46mm/px · z∈[+1400,+1536]mm · 7 of 98 slices shown]
[im 10/98  bone]
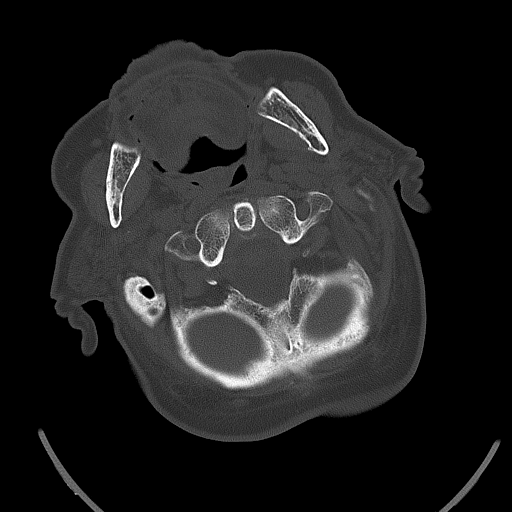
[im 20/98  bone]
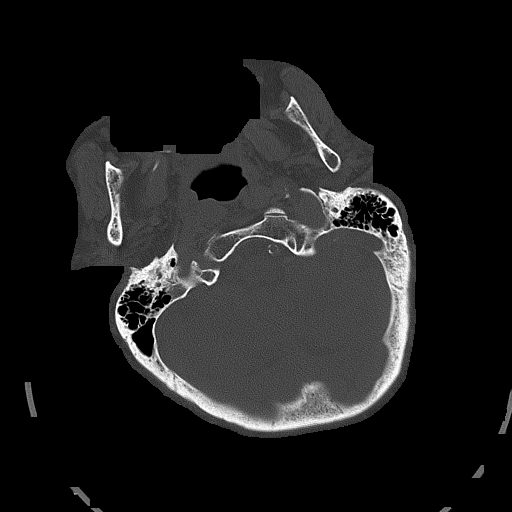
[im 30/98  bone]
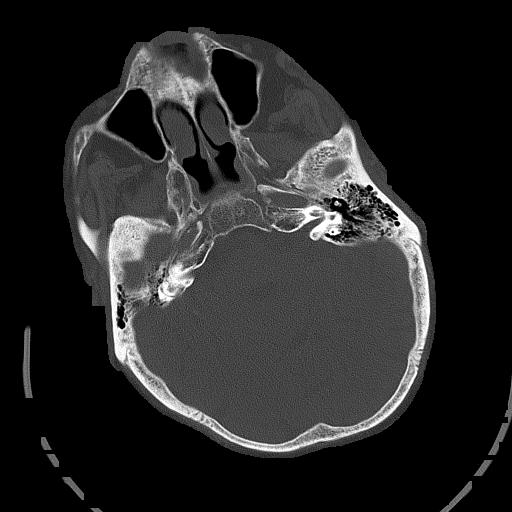
[im 44/98  bone]
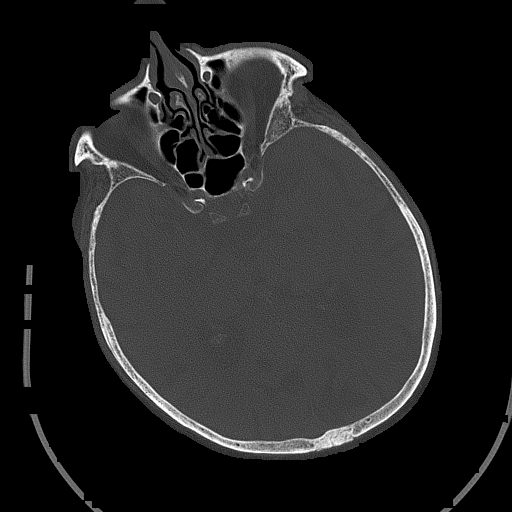
[im 54/98  bone]
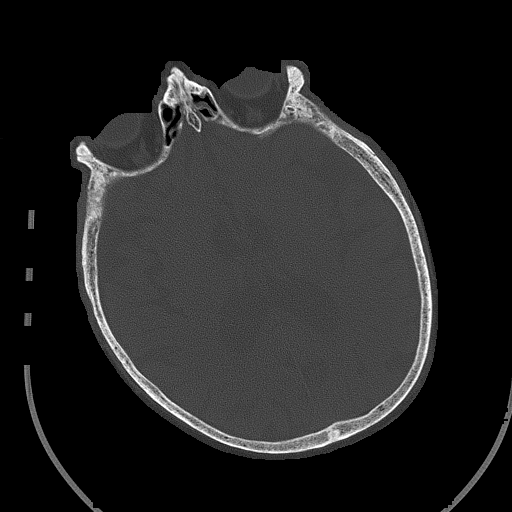
[im 68/98  bone]
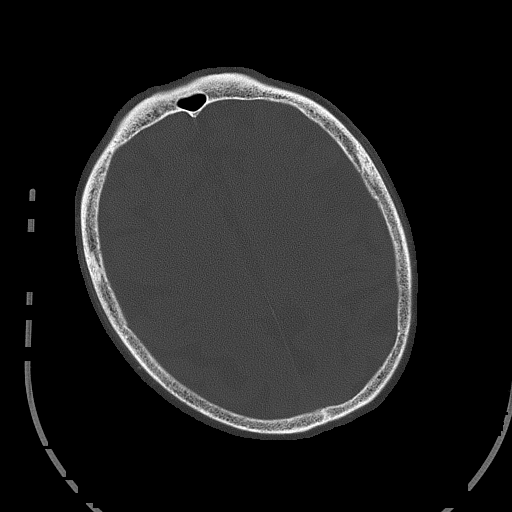
[im 78/98  bone]
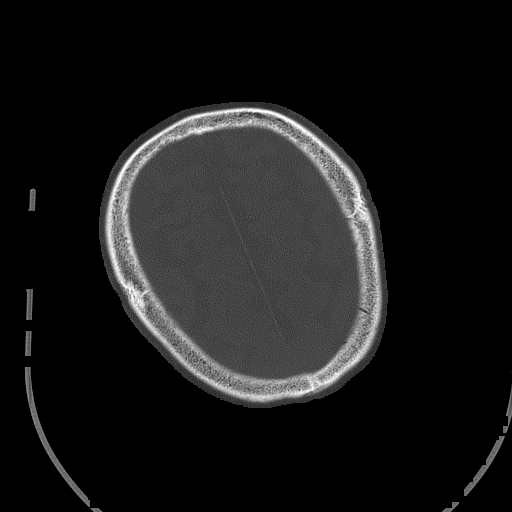

[Series 4: head without · axial · non-contrast · 0.46mm/px · z∈[+1402,+1552]mm · 7 of 40 slices shown, 9 images]
[im 5/40  brain]
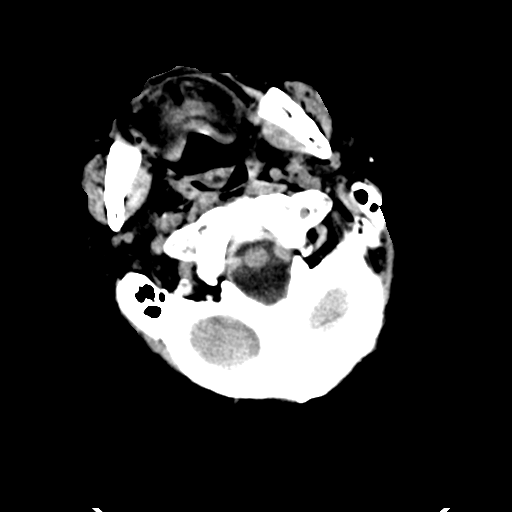
[im 5/40  bone]
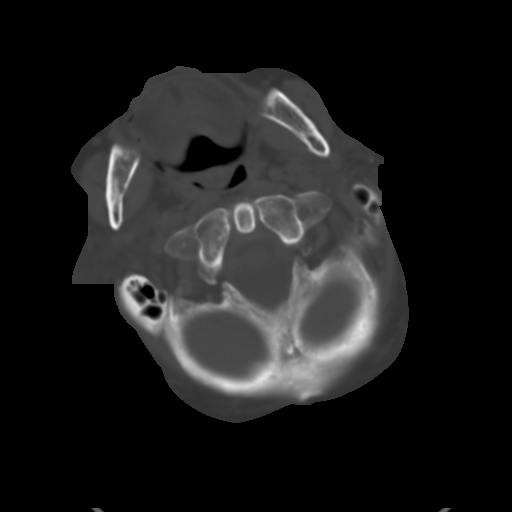
[im 10/40  brain]
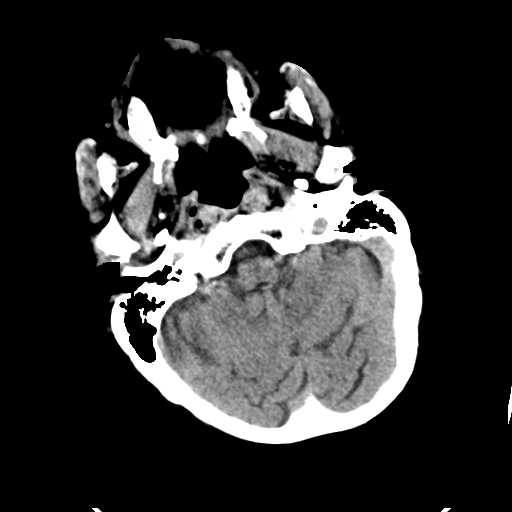
[im 15/40  brain]
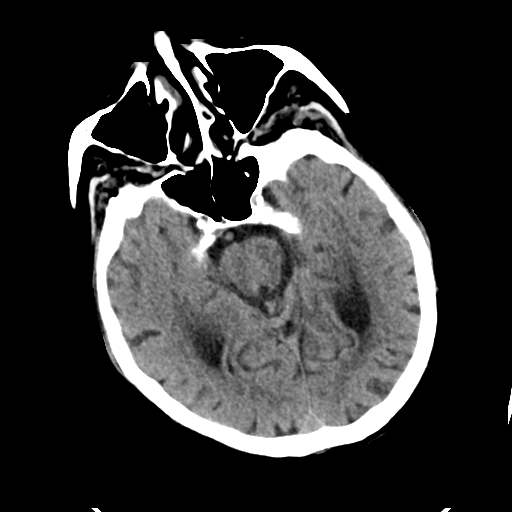
[im 20/40  brain]
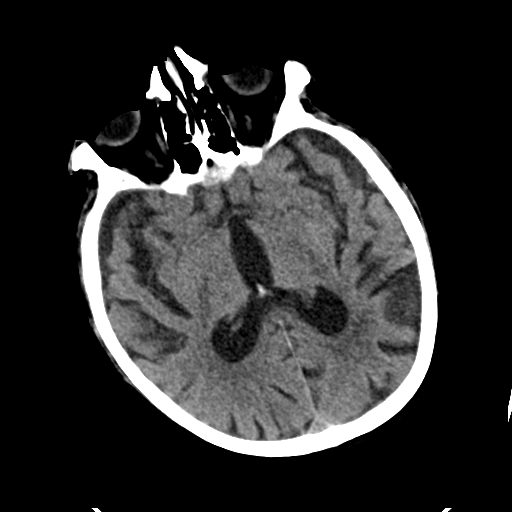
[im 25/40  brain]
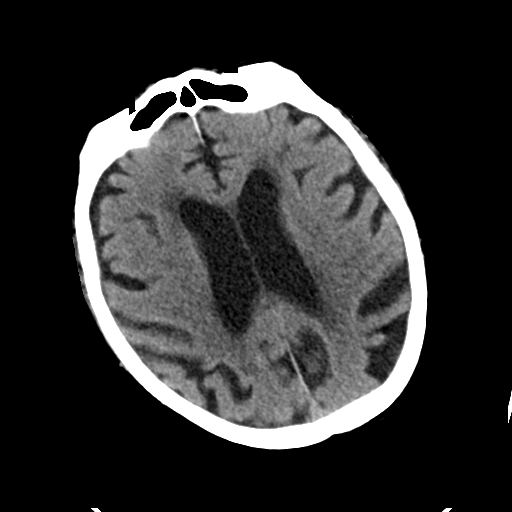
[im 25/40  bone]
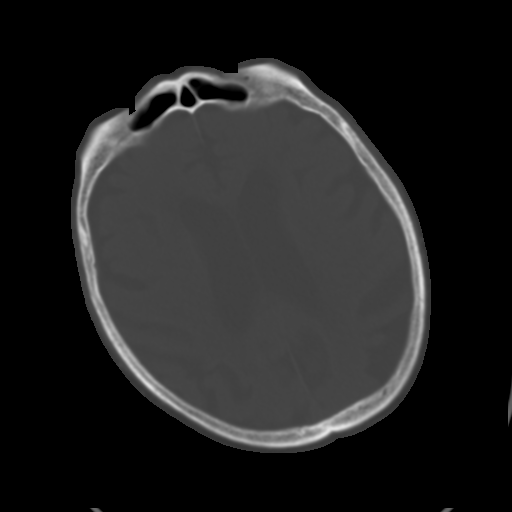
[im 30/40  brain]
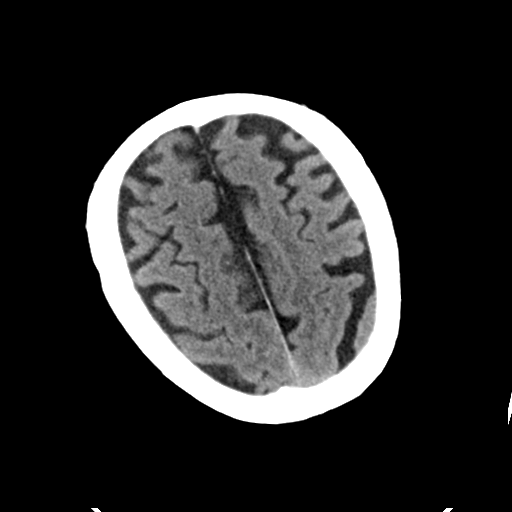
[im 35/40  brain]
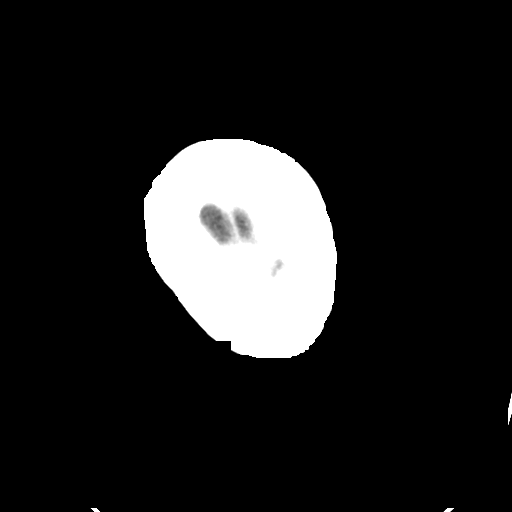

[Series 6: head without sag · sagittal · non-contrast · 0.40mm/px · 3 of 66 slices shown]
[im 22/66  brain]
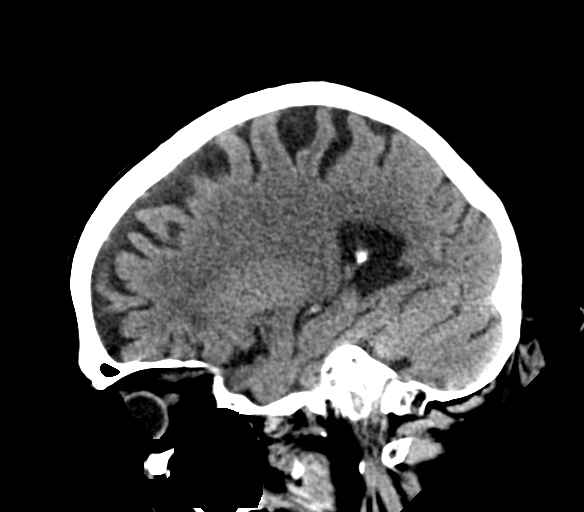
[im 33/66  brain]
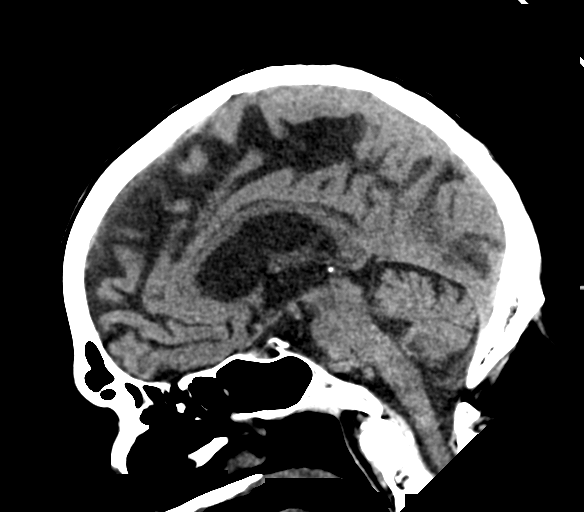
[im 44/66  brain]
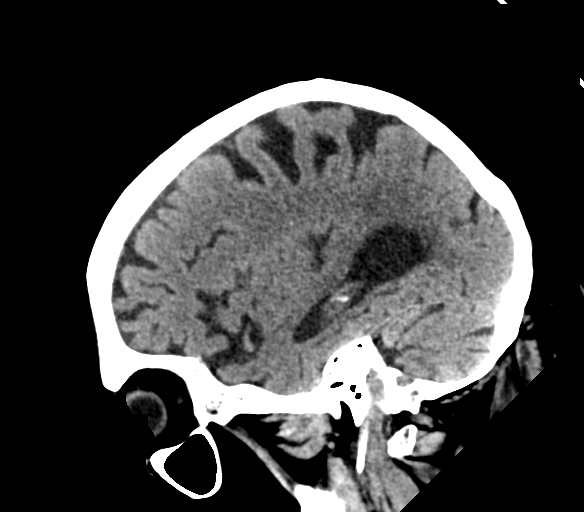

[17 of 37 positions shown; findings below may reference images not displayed]

FINDINGS: Brain: There is mild cerebral atrophy with widening of the
extra-axial spaces and ventricular dilatation.
There are areas of decreased attenuation within the white matter
tracts of the supratentorial brain, consistent with microvascular
disease changes.

Vascular: No hyperdense vessel or unexpected calcification.

Skull: Normal. Negative for fracture or focal lesion.

Sinuses/Orbits: A chronic left-sided nasal bone fracture is seen.

Small bilateral maxillary sinus polyps versus mucous retention cysts
are noted.

Other: None.
IMPRESSION: 1. Generalized cerebral atrophy.
2. No acute intracranial abnormality.

## 2022-02-17 LAB — TRIGLYCERIDES: Triglycerides: 204 mg/dL — ABNORMAL HIGH (ref <150)

## 2022-02-17 LAB — PHOSPHORUS: Phosphorus: 2.8 mg/dL (ref 2.5–4.6)

## 2022-02-17 LAB — MAGNESIUM: Magnesium: 1.5 mg/dL — ABNORMAL LOW (ref 1.7–2.4)

## 2022-02-18 ENCOUNTER — Other Ambulatory Visit (HOSPITAL_COMMUNITY): Payer: Self-pay

## 2022-02-18 LAB — CBC
HCT: 29.4 % — ABNORMAL LOW (ref 39.0–52.0)
Hemoglobin: 9.5 g/dL — ABNORMAL LOW (ref 13.0–17.0)
MCH: 29.7 pg (ref 26.0–34.0)
MCHC: 32.3 g/dL (ref 30.0–36.0)
MCV: 91.9 fL (ref 80.0–100.0)
Platelets: 277 10*3/uL (ref 150–400)
RBC: 3.2 MIL/uL — ABNORMAL LOW (ref 4.22–5.81)
RDW: 13 % (ref 11.5–15.5)
WBC: 6.3 10*3/uL (ref 4.0–10.5)
nRBC: 0 % (ref 0.0–0.2)

## 2022-02-18 LAB — BASIC METABOLIC PANEL
Anion gap: 11 (ref 5–15)
BUN: 22 mg/dL (ref 8–23)
CO2: 24 mmol/L (ref 22–32)
Calcium: 8.9 mg/dL (ref 8.9–10.3)
Chloride: 102 mmol/L (ref 98–111)
Creatinine, Ser: 0.72 mg/dL (ref 0.61–1.24)
GFR, Estimated: >60 mL/min (ref >60)
Glucose, Bld: 152 mg/dL — ABNORMAL HIGH (ref 70–99)
Potassium: 3.6 mmol/L (ref 3.5–5.1)
Sodium: 137 mmol/L (ref 135–145)

## 2022-02-18 LAB — MAGNESIUM: Magnesium: 1.8 mg/dL (ref 1.7–2.4)

## 2022-02-19 ENCOUNTER — Encounter (HOSPITAL_BASED_OUTPATIENT_CLINIC_OR_DEPARTMENT_OTHER): Payer: Self-pay

## 2022-02-19 DIAGNOSIS — M7989 Other specified soft tissue disorders: Secondary | ICD-10-CM

## 2022-02-19 NOTE — Progress Notes (Signed)
Right upper extremity venous duplex has been completed. Preliminary results can be found in CV Proc through chart review.   02/19/22 10:25 AM Devin Garza RVT

## 2022-02-20 LAB — PHOSPHORUS: Phosphorus: 4.5 mg/dL (ref 2.5–4.6)

## 2022-02-20 LAB — MAGNESIUM: Magnesium: 1.8 mg/dL (ref 1.7–2.4)

## 2022-02-23 LAB — COMPREHENSIVE METABOLIC PANEL
ALT: 20 U/L (ref 0–44)
AST: 19 U/L (ref 15–41)
Albumin: 2.8 g/dL — ABNORMAL LOW (ref 3.5–5.0)
Alkaline Phosphatase: 64 U/L (ref 38–126)
Anion gap: 9 (ref 5–15)
BUN: 25 mg/dL — ABNORMAL HIGH (ref 8–23)
CO2: 24 mmol/L (ref 22–32)
Calcium: 9.1 mg/dL (ref 8.9–10.3)
Chloride: 103 mmol/L (ref 98–111)
Creatinine, Ser: 0.72 mg/dL (ref 0.61–1.24)
GFR, Estimated: >60 mL/min (ref >60)
Glucose, Bld: 113 mg/dL — ABNORMAL HIGH (ref 70–99)
Potassium: 3.9 mmol/L (ref 3.5–5.1)
Sodium: 136 mmol/L (ref 135–145)
Total Bilirubin: 0.3 mg/dL (ref 0.3–1.2)
Total Protein: 6.5 g/dL (ref 6.5–8.1)

## 2022-02-23 LAB — MAGNESIUM: Magnesium: 1.9 mg/dL (ref 1.7–2.4)

## 2022-02-23 LAB — CBC
HCT: 23.6 % — ABNORMAL LOW (ref 39.0–52.0)
Hemoglobin: 7.8 g/dL — ABNORMAL LOW (ref 13.0–17.0)
MCH: 29.7 pg (ref 26.0–34.0)
MCHC: 33.1 g/dL (ref 30.0–36.0)
MCV: 89.7 fL (ref 80.0–100.0)
Platelets: 309 10*3/uL (ref 150–400)
RBC: 2.63 MIL/uL — ABNORMAL LOW (ref 4.22–5.81)
RDW: 13.2 % (ref 11.5–15.5)
WBC: 8 10*3/uL (ref 4.0–10.5)
nRBC: 0 % (ref 0.0–0.2)

## 2022-02-23 LAB — FOLATE: Folate: 11 ng/mL (ref >5.9)

## 2022-02-23 LAB — TSH: TSH: 1.261 u[IU]/mL (ref 0.350–4.500)

## 2022-02-23 LAB — PHOSPHORUS: Phosphorus: 5.1 mg/dL — ABNORMAL HIGH (ref 2.5–4.6)

## 2022-02-23 LAB — PROTIME-INR
INR: 1.3 — ABNORMAL HIGH (ref 0.8–1.2)
Prothrombin Time: 16.6 seconds — ABNORMAL HIGH (ref 11.4–15.2)

## 2022-02-23 LAB — VITAMIN B12: Vitamin B-12: 534 pg/mL (ref 180–914)

## 2022-02-24 ENCOUNTER — Encounter: Payer: Medicare Other | Admitting: Internal Medicine

## 2022-02-24 LAB — RENAL FUNCTION PANEL
Albumin: 2.7 g/dL — ABNORMAL LOW (ref 3.5–5.0)
Anion gap: 10 (ref 5–15)
BUN: 30 mg/dL — ABNORMAL HIGH (ref 8–23)
CO2: 24 mmol/L (ref 22–32)
Calcium: 9.1 mg/dL (ref 8.9–10.3)
Chloride: 101 mmol/L (ref 98–111)
Creatinine, Ser: 0.79 mg/dL (ref 0.61–1.24)
GFR, Estimated: >60 mL/min
Glucose, Bld: 127 mg/dL — ABNORMAL HIGH (ref 70–99)
Phosphorus: 5.4 mg/dL — ABNORMAL HIGH (ref 2.5–4.6)
Potassium: 4 mmol/L (ref 3.5–5.1)
Sodium: 135 mmol/L (ref 135–145)

## 2022-02-24 LAB — CBC
HCT: 23.9 % — ABNORMAL LOW (ref 39.0–52.0)
Hemoglobin: 8 g/dL — ABNORMAL LOW (ref 13.0–17.0)
MCH: 30.3 pg (ref 26.0–34.0)
MCHC: 33.5 g/dL (ref 30.0–36.0)
MCV: 90.5 fL (ref 80.0–100.0)
Platelets: 341 10*3/uL (ref 150–400)
RBC: 2.64 MIL/uL — ABNORMAL LOW (ref 4.22–5.81)
RDW: 13.2 % (ref 11.5–15.5)
WBC: 7.2 10*3/uL (ref 4.0–10.5)
nRBC: 0 % (ref 0.0–0.2)

## 2022-02-24 LAB — MAGNESIUM: Magnesium: 2 mg/dL (ref 1.7–2.4)

## 2022-02-24 LAB — TRIGLYCERIDES: Triglycerides: 134 mg/dL

## 2022-02-25 DIAGNOSIS — Z7409 Other reduced mobility: Secondary | ICD-10-CM | POA: Diagnosis not present

## 2022-02-25 DIAGNOSIS — R339 Retention of urine, unspecified: Secondary | ICD-10-CM | POA: Diagnosis not present

## 2022-02-25 DIAGNOSIS — F10939 Alcohol use, unspecified with withdrawal, unspecified: Secondary | ICD-10-CM | POA: Diagnosis not present

## 2022-02-25 DIAGNOSIS — G4700 Insomnia, unspecified: Secondary | ICD-10-CM | POA: Diagnosis not present

## 2022-02-25 DIAGNOSIS — R41840 Attention and concentration deficit: Secondary | ICD-10-CM | POA: Diagnosis not present

## 2022-02-25 DIAGNOSIS — E512 Wernicke's encephalopathy: Secondary | ICD-10-CM | POA: Diagnosis not present

## 2022-02-25 DIAGNOSIS — R338 Other retention of urine: Secondary | ICD-10-CM | POA: Diagnosis not present

## 2022-02-25 DIAGNOSIS — I10 Essential (primary) hypertension: Secondary | ICD-10-CM | POA: Diagnosis not present

## 2022-02-25 DIAGNOSIS — Z8739 Personal history of other diseases of the musculoskeletal system and connective tissue: Secondary | ICD-10-CM | POA: Diagnosis not present

## 2022-02-25 DIAGNOSIS — J690 Pneumonitis due to inhalation of food and vomit: Secondary | ICD-10-CM | POA: Diagnosis not present

## 2022-02-25 DIAGNOSIS — R569 Unspecified convulsions: Secondary | ICD-10-CM | POA: Diagnosis not present

## 2022-02-26 DIAGNOSIS — E512 Wernicke's encephalopathy: Secondary | ICD-10-CM | POA: Diagnosis not present

## 2022-02-26 DIAGNOSIS — J690 Pneumonitis due to inhalation of food and vomit: Secondary | ICD-10-CM | POA: Diagnosis not present

## 2022-02-26 DIAGNOSIS — Z7409 Other reduced mobility: Secondary | ICD-10-CM | POA: Diagnosis not present

## 2022-02-26 DIAGNOSIS — R338 Other retention of urine: Secondary | ICD-10-CM | POA: Diagnosis not present

## 2022-02-26 DIAGNOSIS — J449 Chronic obstructive pulmonary disease, unspecified: Secondary | ICD-10-CM | POA: Diagnosis not present

## 2022-02-26 DIAGNOSIS — I10 Essential (primary) hypertension: Secondary | ICD-10-CM | POA: Diagnosis not present

## 2022-02-26 DIAGNOSIS — R339 Retention of urine, unspecified: Secondary | ICD-10-CM | POA: Diagnosis not present

## 2022-02-26 DIAGNOSIS — R569 Unspecified convulsions: Secondary | ICD-10-CM | POA: Diagnosis not present

## 2022-02-26 DIAGNOSIS — F10939 Alcohol use, unspecified with withdrawal, unspecified: Secondary | ICD-10-CM | POA: Diagnosis not present

## 2022-02-26 DIAGNOSIS — Z8739 Personal history of other diseases of the musculoskeletal system and connective tissue: Secondary | ICD-10-CM | POA: Diagnosis not present

## 2022-02-26 DIAGNOSIS — R41840 Attention and concentration deficit: Secondary | ICD-10-CM | POA: Diagnosis not present

## 2022-02-27 DIAGNOSIS — Z7409 Other reduced mobility: Secondary | ICD-10-CM | POA: Diagnosis not present

## 2022-02-27 DIAGNOSIS — R41840 Attention and concentration deficit: Secondary | ICD-10-CM | POA: Diagnosis not present

## 2022-02-27 DIAGNOSIS — R338 Other retention of urine: Secondary | ICD-10-CM | POA: Diagnosis not present

## 2022-02-27 DIAGNOSIS — I10 Essential (primary) hypertension: Secondary | ICD-10-CM | POA: Diagnosis not present

## 2022-02-27 DIAGNOSIS — E512 Wernicke's encephalopathy: Secondary | ICD-10-CM | POA: Diagnosis not present

## 2022-02-28 DIAGNOSIS — R338 Other retention of urine: Secondary | ICD-10-CM | POA: Diagnosis not present

## 2022-02-28 DIAGNOSIS — R41840 Attention and concentration deficit: Secondary | ICD-10-CM | POA: Diagnosis not present

## 2022-02-28 DIAGNOSIS — Z7409 Other reduced mobility: Secondary | ICD-10-CM | POA: Diagnosis not present

## 2022-02-28 DIAGNOSIS — E512 Wernicke's encephalopathy: Secondary | ICD-10-CM | POA: Diagnosis not present

## 2022-02-28 DIAGNOSIS — I10 Essential (primary) hypertension: Secondary | ICD-10-CM | POA: Diagnosis not present

## 2022-03-01 DIAGNOSIS — I10 Essential (primary) hypertension: Secondary | ICD-10-CM | POA: Diagnosis not present

## 2022-03-01 DIAGNOSIS — J449 Chronic obstructive pulmonary disease, unspecified: Secondary | ICD-10-CM | POA: Diagnosis not present

## 2022-03-01 DIAGNOSIS — Z7409 Other reduced mobility: Secondary | ICD-10-CM | POA: Diagnosis not present

## 2022-03-01 DIAGNOSIS — E512 Wernicke's encephalopathy: Secondary | ICD-10-CM | POA: Diagnosis not present

## 2022-03-01 DIAGNOSIS — R338 Other retention of urine: Secondary | ICD-10-CM | POA: Diagnosis not present

## 2022-03-01 DIAGNOSIS — Z8739 Personal history of other diseases of the musculoskeletal system and connective tissue: Secondary | ICD-10-CM | POA: Diagnosis not present

## 2022-03-01 DIAGNOSIS — R41840 Attention and concentration deficit: Secondary | ICD-10-CM | POA: Diagnosis not present

## 2022-03-02 DIAGNOSIS — I10 Essential (primary) hypertension: Secondary | ICD-10-CM | POA: Diagnosis not present

## 2022-03-02 DIAGNOSIS — E512 Wernicke's encephalopathy: Secondary | ICD-10-CM | POA: Diagnosis not present

## 2022-03-02 DIAGNOSIS — R338 Other retention of urine: Secondary | ICD-10-CM | POA: Diagnosis not present

## 2022-03-02 DIAGNOSIS — R41840 Attention and concentration deficit: Secondary | ICD-10-CM | POA: Diagnosis not present

## 2022-03-02 DIAGNOSIS — Z7409 Other reduced mobility: Secondary | ICD-10-CM | POA: Diagnosis not present

## 2022-03-03 DIAGNOSIS — E512 Wernicke's encephalopathy: Secondary | ICD-10-CM | POA: Diagnosis not present

## 2022-03-03 DIAGNOSIS — Z7409 Other reduced mobility: Secondary | ICD-10-CM | POA: Diagnosis not present

## 2022-03-03 DIAGNOSIS — I10 Essential (primary) hypertension: Secondary | ICD-10-CM | POA: Diagnosis not present

## 2022-03-03 DIAGNOSIS — R41840 Attention and concentration deficit: Secondary | ICD-10-CM | POA: Diagnosis not present

## 2022-03-03 DIAGNOSIS — R338 Other retention of urine: Secondary | ICD-10-CM | POA: Diagnosis not present

## 2022-03-04 DIAGNOSIS — Z7409 Other reduced mobility: Secondary | ICD-10-CM | POA: Diagnosis not present

## 2022-03-04 DIAGNOSIS — E512 Wernicke's encephalopathy: Secondary | ICD-10-CM | POA: Diagnosis not present

## 2022-03-04 DIAGNOSIS — R41840 Attention and concentration deficit: Secondary | ICD-10-CM | POA: Diagnosis not present

## 2022-03-04 DIAGNOSIS — I10 Essential (primary) hypertension: Secondary | ICD-10-CM | POA: Diagnosis not present

## 2022-03-04 DIAGNOSIS — R338 Other retention of urine: Secondary | ICD-10-CM | POA: Diagnosis not present

## 2022-03-05 DIAGNOSIS — R41840 Attention and concentration deficit: Secondary | ICD-10-CM | POA: Diagnosis not present

## 2022-03-05 DIAGNOSIS — I10 Essential (primary) hypertension: Secondary | ICD-10-CM | POA: Diagnosis not present

## 2022-03-05 DIAGNOSIS — E512 Wernicke's encephalopathy: Secondary | ICD-10-CM | POA: Diagnosis not present

## 2022-03-05 DIAGNOSIS — Z7409 Other reduced mobility: Secondary | ICD-10-CM | POA: Diagnosis not present

## 2022-03-05 DIAGNOSIS — R338 Other retention of urine: Secondary | ICD-10-CM | POA: Diagnosis not present

## 2022-03-06 DIAGNOSIS — R1310 Dysphagia, unspecified: Secondary | ICD-10-CM | POA: Diagnosis not present

## 2022-03-06 DIAGNOSIS — J9600 Acute respiratory failure, unspecified whether with hypoxia or hypercapnia: Secondary | ICD-10-CM | POA: Diagnosis not present

## 2022-03-06 DIAGNOSIS — R2689 Other abnormalities of gait and mobility: Secondary | ICD-10-CM | POA: Diagnosis not present

## 2022-03-06 DIAGNOSIS — E512 Wernicke's encephalopathy: Secondary | ICD-10-CM | POA: Diagnosis not present

## 2022-03-07 DIAGNOSIS — R2689 Other abnormalities of gait and mobility: Secondary | ICD-10-CM | POA: Diagnosis not present

## 2022-03-07 DIAGNOSIS — R1310 Dysphagia, unspecified: Secondary | ICD-10-CM | POA: Diagnosis not present

## 2022-03-07 DIAGNOSIS — J9600 Acute respiratory failure, unspecified whether with hypoxia or hypercapnia: Secondary | ICD-10-CM | POA: Diagnosis not present

## 2022-03-07 DIAGNOSIS — E512 Wernicke's encephalopathy: Secondary | ICD-10-CM | POA: Diagnosis not present

## 2022-03-08 DIAGNOSIS — R41840 Attention and concentration deficit: Secondary | ICD-10-CM | POA: Diagnosis not present

## 2022-03-08 DIAGNOSIS — E512 Wernicke's encephalopathy: Secondary | ICD-10-CM | POA: Diagnosis not present

## 2022-03-08 DIAGNOSIS — I10 Essential (primary) hypertension: Secondary | ICD-10-CM | POA: Diagnosis not present

## 2022-03-08 DIAGNOSIS — Z7409 Other reduced mobility: Secondary | ICD-10-CM | POA: Diagnosis not present

## 2022-03-08 DIAGNOSIS — R338 Other retention of urine: Secondary | ICD-10-CM | POA: Diagnosis not present

## 2022-03-09 DIAGNOSIS — Z7409 Other reduced mobility: Secondary | ICD-10-CM | POA: Diagnosis not present

## 2022-03-09 DIAGNOSIS — R41840 Attention and concentration deficit: Secondary | ICD-10-CM | POA: Diagnosis not present

## 2022-03-09 DIAGNOSIS — E512 Wernicke's encephalopathy: Secondary | ICD-10-CM | POA: Diagnosis not present

## 2022-03-09 DIAGNOSIS — R338 Other retention of urine: Secondary | ICD-10-CM | POA: Diagnosis not present

## 2022-03-09 DIAGNOSIS — I10 Essential (primary) hypertension: Secondary | ICD-10-CM | POA: Diagnosis not present

## 2022-03-10 DIAGNOSIS — E512 Wernicke's encephalopathy: Secondary | ICD-10-CM | POA: Diagnosis not present

## 2022-03-10 DIAGNOSIS — R338 Other retention of urine: Secondary | ICD-10-CM | POA: Diagnosis not present

## 2022-03-10 DIAGNOSIS — R41840 Attention and concentration deficit: Secondary | ICD-10-CM | POA: Diagnosis not present

## 2022-03-10 DIAGNOSIS — Z7409 Other reduced mobility: Secondary | ICD-10-CM | POA: Diagnosis not present

## 2022-03-10 DIAGNOSIS — I10 Essential (primary) hypertension: Secondary | ICD-10-CM | POA: Diagnosis not present

## 2022-03-11 DIAGNOSIS — I10 Essential (primary) hypertension: Secondary | ICD-10-CM | POA: Diagnosis not present

## 2022-03-11 DIAGNOSIS — R41840 Attention and concentration deficit: Secondary | ICD-10-CM | POA: Diagnosis not present

## 2022-03-11 DIAGNOSIS — R338 Other retention of urine: Secondary | ICD-10-CM | POA: Diagnosis not present

## 2022-03-11 DIAGNOSIS — E512 Wernicke's encephalopathy: Secondary | ICD-10-CM | POA: Diagnosis not present

## 2022-03-11 DIAGNOSIS — Z7409 Other reduced mobility: Secondary | ICD-10-CM | POA: Diagnosis not present

## 2022-03-12 DIAGNOSIS — R338 Other retention of urine: Secondary | ICD-10-CM | POA: Diagnosis not present

## 2022-03-12 DIAGNOSIS — R41840 Attention and concentration deficit: Secondary | ICD-10-CM | POA: Diagnosis not present

## 2022-03-12 DIAGNOSIS — Z7409 Other reduced mobility: Secondary | ICD-10-CM | POA: Diagnosis not present

## 2022-03-12 DIAGNOSIS — I10 Essential (primary) hypertension: Secondary | ICD-10-CM | POA: Diagnosis not present

## 2022-03-12 DIAGNOSIS — E512 Wernicke's encephalopathy: Secondary | ICD-10-CM | POA: Diagnosis not present

## 2022-03-14 DIAGNOSIS — E512 Wernicke's encephalopathy: Secondary | ICD-10-CM | POA: Diagnosis not present

## 2022-03-14 DIAGNOSIS — R918 Other nonspecific abnormal finding of lung field: Secondary | ICD-10-CM | POA: Diagnosis not present

## 2022-03-14 DIAGNOSIS — J449 Chronic obstructive pulmonary disease, unspecified: Secondary | ICD-10-CM | POA: Diagnosis not present

## 2022-03-14 DIAGNOSIS — R569 Unspecified convulsions: Secondary | ICD-10-CM | POA: Diagnosis not present

## 2022-03-14 DIAGNOSIS — R339 Retention of urine, unspecified: Secondary | ICD-10-CM | POA: Diagnosis not present

## 2022-03-14 DIAGNOSIS — D649 Anemia, unspecified: Secondary | ICD-10-CM | POA: Diagnosis not present

## 2022-03-14 DIAGNOSIS — Z8739 Personal history of other diseases of the musculoskeletal system and connective tissue: Secondary | ICD-10-CM | POA: Diagnosis not present

## 2022-03-14 DIAGNOSIS — I959 Hypotension, unspecified: Secondary | ICD-10-CM | POA: Diagnosis not present

## 2022-03-14 DIAGNOSIS — J690 Pneumonitis due to inhalation of food and vomit: Secondary | ICD-10-CM | POA: Diagnosis not present

## 2022-03-14 DIAGNOSIS — I10 Essential (primary) hypertension: Secondary | ICD-10-CM | POA: Diagnosis not present

## 2022-03-14 DIAGNOSIS — F10939 Alcohol use, unspecified with withdrawal, unspecified: Secondary | ICD-10-CM | POA: Diagnosis not present

## 2022-03-15 ENCOUNTER — Encounter (HOSPITAL_BASED_OUTPATIENT_CLINIC_OR_DEPARTMENT_OTHER): Payer: Self-pay

## 2022-03-15 ENCOUNTER — Other Ambulatory Visit: Payer: Self-pay

## 2022-03-15 ENCOUNTER — Emergency Department (HOSPITAL_BASED_OUTPATIENT_CLINIC_OR_DEPARTMENT_OTHER): Payer: Medicare Other

## 2022-03-15 ENCOUNTER — Emergency Department (HOSPITAL_BASED_OUTPATIENT_CLINIC_OR_DEPARTMENT_OTHER)
Admission: EM | Admit: 2022-03-15 | Discharge: 2022-03-16 | Disposition: A | Discharge status: Skilled Nursing Facility | Payer: Medicare Other | Attending: Emergency Medicine | Admitting: Emergency Medicine

## 2022-03-15 DIAGNOSIS — W19XXXA Unspecified fall, initial encounter: Secondary | ICD-10-CM

## 2022-03-15 DIAGNOSIS — N3000 Acute cystitis without hematuria: Secondary | ICD-10-CM

## 2022-03-15 DIAGNOSIS — R339 Retention of urine, unspecified: Secondary | ICD-10-CM | POA: Diagnosis not present

## 2022-03-15 DIAGNOSIS — M542 Cervicalgia: Secondary | ICD-10-CM | POA: Insufficient documentation

## 2022-03-15 DIAGNOSIS — I517 Cardiomegaly: Secondary | ICD-10-CM | POA: Diagnosis not present

## 2022-03-15 DIAGNOSIS — E512 Wernicke's encephalopathy: Secondary | ICD-10-CM | POA: Diagnosis not present

## 2022-03-15 DIAGNOSIS — G9341 Metabolic encephalopathy: Secondary | ICD-10-CM | POA: Diagnosis not present

## 2022-03-15 DIAGNOSIS — D649 Anemia, unspecified: Secondary | ICD-10-CM | POA: Diagnosis not present

## 2022-03-15 DIAGNOSIS — S51011A Laceration without foreign body of right elbow, initial encounter: Secondary | ICD-10-CM | POA: Insufficient documentation

## 2022-03-15 DIAGNOSIS — Y9289 Other specified places as the place of occurrence of the external cause: Secondary | ICD-10-CM | POA: Diagnosis not present

## 2022-03-15 DIAGNOSIS — R569 Unspecified convulsions: Secondary | ICD-10-CM | POA: Diagnosis not present

## 2022-03-15 DIAGNOSIS — W07XXXA Fall from chair, initial encounter: Secondary | ICD-10-CM | POA: Diagnosis not present

## 2022-03-15 DIAGNOSIS — I10 Essential (primary) hypertension: Secondary | ICD-10-CM | POA: Diagnosis not present

## 2022-03-15 DIAGNOSIS — Z20822 Contact with and (suspected) exposure to covid-19: Secondary | ICD-10-CM | POA: Diagnosis not present

## 2022-03-15 DIAGNOSIS — Z8739 Personal history of other diseases of the musculoskeletal system and connective tissue: Secondary | ICD-10-CM | POA: Diagnosis not present

## 2022-03-15 DIAGNOSIS — D72829 Elevated white blood cell count, unspecified: Secondary | ICD-10-CM | POA: Insufficient documentation

## 2022-03-15 DIAGNOSIS — S0990XA Unspecified injury of head, initial encounter: Secondary | ICD-10-CM | POA: Diagnosis not present

## 2022-03-15 DIAGNOSIS — S59901A Unspecified injury of right elbow, initial encounter: Secondary | ICD-10-CM | POA: Diagnosis present

## 2022-03-15 DIAGNOSIS — J9600 Acute respiratory failure, unspecified whether with hypoxia or hypercapnia: Secondary | ICD-10-CM | POA: Diagnosis not present

## 2022-03-15 DIAGNOSIS — J449 Chronic obstructive pulmonary disease, unspecified: Secondary | ICD-10-CM | POA: Diagnosis not present

## 2022-03-15 DIAGNOSIS — F10939 Alcohol use, unspecified with withdrawal, unspecified: Secondary | ICD-10-CM | POA: Diagnosis not present

## 2022-03-15 DIAGNOSIS — J690 Pneumonitis due to inhalation of food and vomit: Secondary | ICD-10-CM | POA: Diagnosis not present

## 2022-03-15 LAB — CBC WITH DIFFERENTIAL/PLATELET
Abs Immature Granulocytes: 0.05 10*3/uL (ref 0.00–0.07)
Basophils Absolute: 0 10*3/uL (ref 0.0–0.1)
Basophils Relative: 0 %
Eosinophils Absolute: 0 10*3/uL (ref 0.0–0.5)
Eosinophils Relative: 0 %
HCT: 29.6 % — ABNORMAL LOW (ref 39.0–52.0)
Hemoglobin: 9.2 g/dL — ABNORMAL LOW (ref 13.0–17.0)
Immature Granulocytes: 0 %
Lymphocytes Relative: 6 %
Lymphs Abs: 0.9 10*3/uL (ref 0.7–4.0)
MCH: 27.3 pg (ref 26.0–34.0)
MCHC: 31.1 g/dL (ref 30.0–36.0)
MCV: 87.8 fL (ref 80.0–100.0)
Monocytes Absolute: 1.5 10*3/uL — ABNORMAL HIGH (ref 0.1–1.0)
Monocytes Relative: 11 %
Neutro Abs: 11 10*3/uL — ABNORMAL HIGH (ref 1.7–7.7)
Neutrophils Relative %: 83 %
Platelets: 313 10*3/uL (ref 150–400)
RBC: 3.37 MIL/uL — ABNORMAL LOW (ref 4.22–5.81)
RDW: 15.2 % (ref 11.5–15.5)
WBC: 13.5 10*3/uL — ABNORMAL HIGH (ref 4.0–10.5)
nRBC: 0 % (ref 0.0–0.2)

## 2022-03-15 IMAGING — DX DG CHEST 1V PORT
1 series · 1 of 1 positions shown · non-contrast
Comparison: Portable chest [DATE].

CLINICAL DATA: Questionable sepsis.

EXAM:
PORTABLE CHEST 1 VIEW

[chest ap]
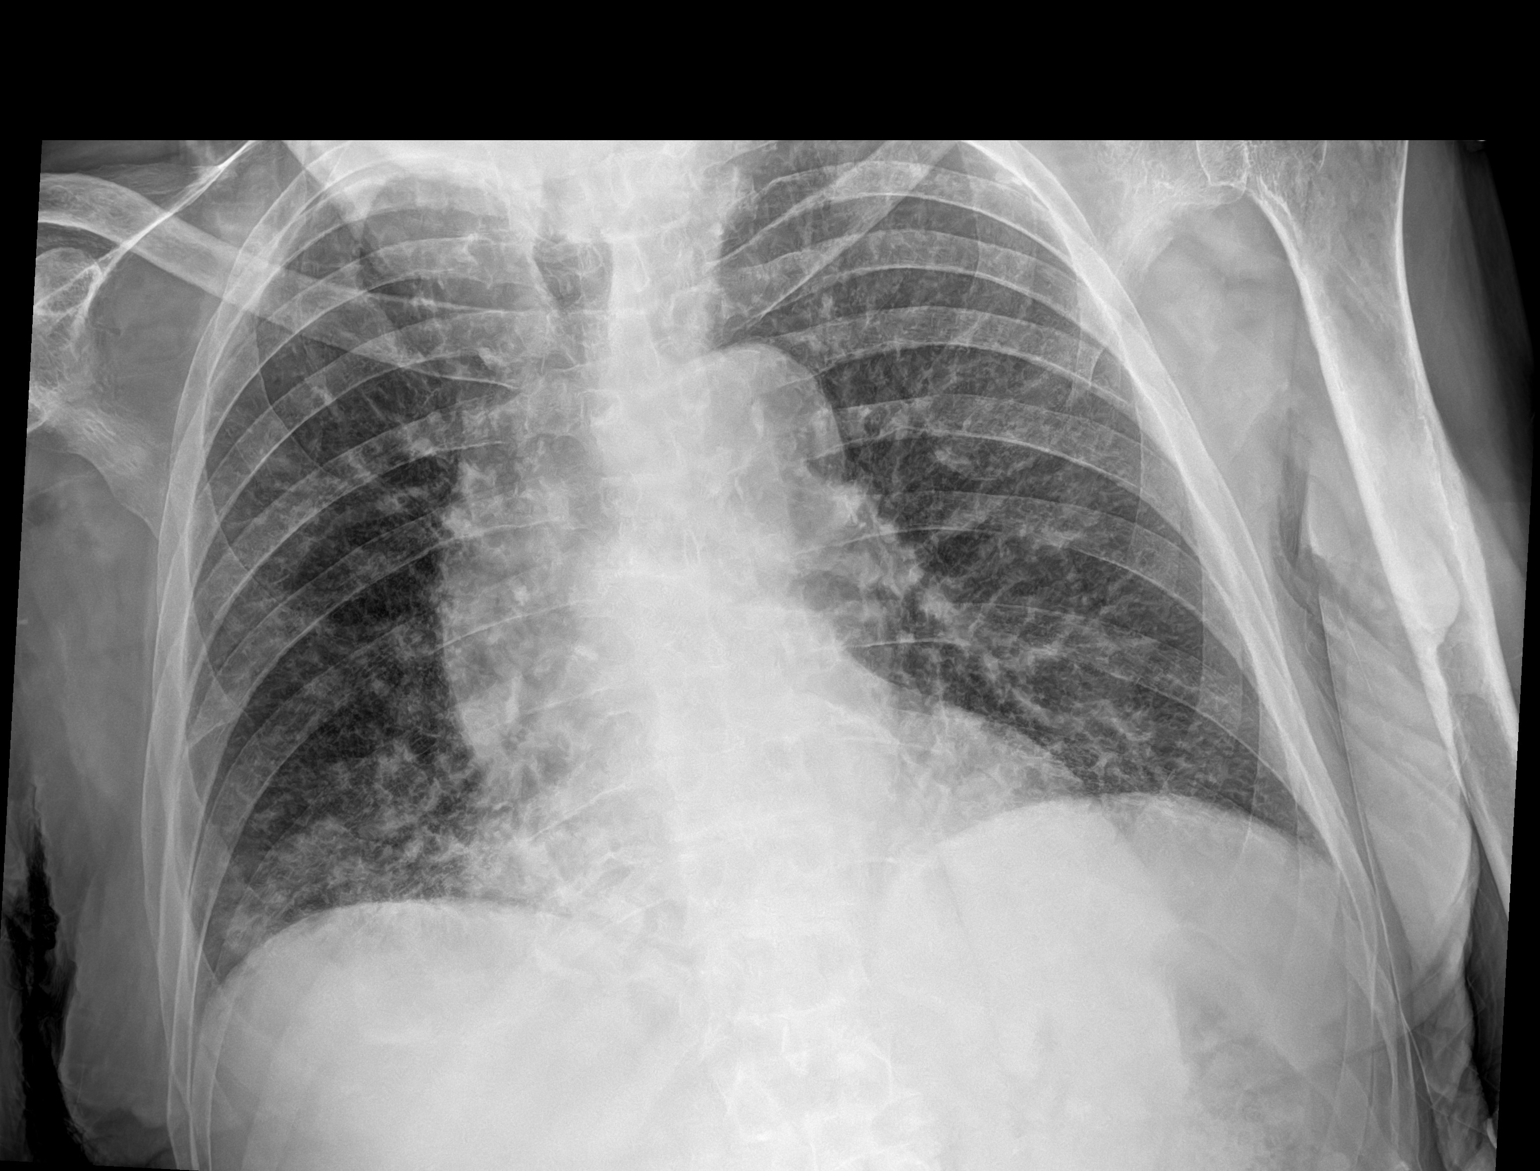

[1 of 1 positions shown; findings below may reference images not displayed]

FINDINGS: The heart is enlarged. Central vessels are normal caliber.
Inspiration is less than previously. There is increased opacity in
the base of both lungs which could be due to atelectasis or patchy
pneumonia. Follow-up study recommended in full inspiration.

The remaining lungs are clear. No pleural effusion is seen.
Mediastinum is stable with aortic ectasia, tortuosity and
calcification. Generalized osteopenia.
IMPRESSION: Increased opacity in the lung bases, greater on the right. This
could be related to low inspiration or bibasilar pneumonia.
Follow-up study recommended in full inspiration. Cardiomegaly.
Aortic atherosclerosis.

## 2022-03-15 MED ORDER — LACTATED RINGERS IV BOLUS (SEPSIS)
1000.0000 mL | Freq: Once | INTRAVENOUS | Status: AC
  Administered 2022-03-15: 1000 mL via INTRAVENOUS

## 2022-03-15 NOTE — ED Triage Notes (Signed)
Pt arrives by [facility] following an unwitnessed fall from a chair at assisted living facility. ? ?It is unclear if the pt hit his head with fall. ? ?Pt c/o back and buttock pain and there is a skin tear to right elbow. ? ?No history of blood thinners. ? ?Pt states he is having pain in his buttocks. ? ?Pt was recently in rehab for alcohol withdrawal.  ? ?Pt alert and oriented to person and situation. ? ?Pt brought in with c-collar in place. ? ?No acute distress during triage. ? ?

## 2022-03-15 NOTE — ED Notes (Signed)
Pt recently returned to [facility] from hospital. Attempted to reach son Rayburn Ma [phone]). No answer. Reached sister Carles Collet via phone who directed RN to reach [place] [name] [place] at [phone]). Unable to reach [employer] or [employer].  ?

## 2022-03-15 NOTE — ED Notes (Signed)
EMS reports that patient is on a thicken liquid diet.  ?

## 2022-03-15 NOTE — ED Provider Notes (Signed)
? ?[name]  ?Provider Note ? ?CSN: [id number] ?Arrival date & time: 03/15/22 2226 ? ?History ?Chief Complaint  ?Patient presents with  ? Fall  ? ? ?Devin Garza is a 81 y.o. male recently admitted for weakness, electrolyte derangements and presumed EtOH withdrawal seizure is now living at [facility]. Brought by EMS today after an unwitnessed fall from chair. He reports neck pain to me, reported buttock pain to RN. Found to be febrile and tachycardic on arrival. He denies any cough or SOB.  ? ? ?Home Medications ?Prior to Admission medications   ?Medication Sig Start Date End Date Taking? Authorizing Provider  ?amLODipine (NORVASC) 10 MG tablet Take 1 tablet (10 mg total) by mouth daily. 07/21/21   Isaac Bliss, Rayford Halsted, MD  ?atorvastatin (LIPITOR) 10 MG tablet Take 1 tablet (10 mg total) by mouth daily. 07/21/21   Isaac Bliss, Rayford Halsted, MD  ?bethanechol (URECHOLINE) 10 MG tablet Take 1 tablet (10 mg total) by mouth 3 (three) times daily. 01/29/22   Jose Persia, MD  ?cephALEXin (KEFLEX) 500 MG capsule Take 1 capsule (500 mg total) by mouth 2 (two) times daily for 7 days. 03/16/22 03/23/22  Truddie Hidden, MD  ?colchicine 0.6 MG tablet TAKE 1 TABLET BY MOUTH 2 TIMES DAILY. ?Patient taking differently: Take 0.6 mg by mouth 2 (two) times daily. 12/10/20   Isaac Bliss, Rayford Halsted, MD  ?Diclofenac Sodium 1.5 % SOLN Apply 1 mL topically 4 (four) times daily as needed. ?Patient taking differently: Apply 1 mL topically 4 (four) times daily as needed (pain). 02/04/21   Mcarthur Rossetti, MD  ?esomeprazole (NEXIUM 24HR) 20 MG capsule Take 2 capsules (40 mg total) by mouth 2 (two) times daily before a meal. 10/24/20   Medina-Vargas, Monina C, NP  ?folic acid (FOLVITE) 1 MG tablet Take 1 tablet (1 mg total) by mouth daily. 01/30/22   Jose Persia, MD  ?ipratropium-albuterol (DUONEB) 0.5-2.5 (3) MG/3ML SOLN Take 3 mLs by nebulization every 6 (six) hours as needed.  01/29/22   Jose Persia, MD  ?loratadine (CLARITIN) 10 MG tablet Take 1 tablet (10 mg total) by mouth daily as needed for allergies. 10/24/20   Medina-Vargas, Monina C, NP  ?magnesium oxide (MAG-OX) 400 MG tablet Take 1 tablet by mouth morning, noon and at bedtime 07/21/21   Isaac Bliss, Rayford Halsted, MD  ?metoprolol tartrate (LOPRESSOR) 25 MG tablet Take 1 tablet (25 mg total) by mouth 2 (two) times daily. 01/29/22   Jose Persia, MD  ?Multiple Vitamin (MULTIVITAMIN) tablet Take 1 tablet by mouth daily.    [provider]  ?tamsulosin (FLOMAX) 0.4 MG CAPS capsule Take 1 capsule (0.4 mg total) by mouth daily after supper. 01/29/22   Jose Persia, MD  ?thiamine 100 MG tablet Take 1 tablet (100 mg total) by mouth daily. 01/30/22   Jose Persia, MD  ? ? ? ?Allergies    ?Patient has no known allergies. ? ? ?Review of Systems   ?Review of Systems ?Please see HPI for pertinent positives and negatives ? ?Physical Exam ?BP 109/61   Pulse 68   Temp 100 ?F (37.8 ?C) (Oral)   Resp 10   Ht '5\' 10"'$  (1.778 m)   Wt 79.4 kg   SpO2 96%   BMI 25.11 kg/m?  ? ?Physical Exam ?Vitals and nursing note reviewed.  ?Constitutional:   ?   Appearance: Normal appearance.  ?HENT:  ?   Head: Normocephalic and atraumatic.  ?   Nose:  Nose normal.  ?   Mouth/Throat:  ?   Mouth: Mucous membranes are moist.  ?Eyes:  ?   Extraocular Movements: Extraocular movements intact.  ?   Conjunctiva/sclera: Conjunctivae normal.  ?Neck:  ?   Comments: In C-collar ?Cardiovascular:  ?   Rate and Rhythm: Normal rate.  ?Pulmonary:  ?   Effort: Pulmonary effort is normal.  ?   Breath sounds: Normal breath sounds.  ?Abdominal:  ?   General: Abdomen is flat.  ?   Palpations: Abdomen is soft.  ?   Tenderness: There is no abdominal tenderness.  ?Musculoskeletal:     ?   General: No swelling. Normal range of motion.  ?   Comments: Skin tear R elbow  ?Skin: ?   General: Skin is warm and dry.  ?Neurological:  ?   General: No focal deficit present.  ?    Mental Status: He is alert.  ?Psychiatric:     ?   Mood and Affect: Mood normal.  ? ? ?ED Results / Procedures / Treatments   ?EKG ?EKG Interpretation ? ?Date/Time:  Monday March 15 2022 23:14:00 EDT ?Ventricular Rate:  108 ?PR Interval:  168 ?QRS Duration: 93 ?QT Interval:  320 ?QTC Calculation: 429 ?R Axis:   20 ?Text Interpretation: Sinus tachycardia Atrial premature complexes in couplets Minimal ST depression, anterolateral leads No significant change since last tracing Confirmed by Calvert Cantor [phone]) on 03/15/2022 11:43:07 PM ? ?Procedures ?Procedures ? ?Medications Ordered in the ED ?Medications  ?lactated ringers bolus 1,000 mL (0 mLs Intravenous Stopped 03/16/22 0038)  ?acetaminophen (TYLENOL) tablet 650 mg (650 mg Oral Given 03/16/22 0123)  ?magnesium sulfate IVPB 2 g 50 mL (0 g Intravenous Stopped 03/16/22 0307)  ?cefTRIAXone (ROCEPHIN) 1 g in sodium chloride 0.9 % 100 mL IVPB (0 g Intravenous Stopped 03/16/22 0255)  ?lactated ringers bolus 1,000 mL (0 mLs Intravenous Stopped 03/16/22 0410)  ? ? ?Initial Impression and Plan ? Patient with unwitnessed fall, noted to be febrile and tachycardic on arrival. Will check CT head/Cspine for fall, sepsis labs and begin IVF pending results. Not hypotensive here so will hold on Abx pending identification of a source or signs of shock.  ? ?ED Course  ? ?Clinical Course as of 03/16/22 0424  ?Tue Mar 16, 2022  ?0007 CBC with mild leukocytosis. CMP is unremarkable. Lactic acid is neg.  [CS]  ?54 Mag is low. Will begin repletion. UA is suspicious for UTI, will give a dose of Rocephin.  [CS]  ?0149 BP is trending down. Will give additional IVF and recheck lactic acid.  [CS]  ?0253 BP improved. Repeat lactic acid remains normal. Will continue to monitor while getting Mag repletion.  [CS]  ?0422 Patient resting comfortably. Infusions complete. Will return to ALF, Rx for Keflex for UTI. PCP follow up.  [CS]  ?  ?Clinical Course User Index ?[CS] Truddie Hidden, MD   ? ? ? ?MDM Rules/Calculators/A&P ?Medical Decision Making ?Given presenting complaint, I considered that admission might be necessary. After review of results from ED lab and/or imaging studies, admission to the hospital is not indicated at this time.  ? ? ?Amount and/or Complexity of Data Reviewed ?External Data Reviewed: labs, radiology and notes. ?Labs: ordered. Decision-making details documented in ED Course. ?Radiology: ordered and independent interpretation performed. Decision-making details documented in ED Course. ?ECG/medicine tests: ordered and independent interpretation performed. Decision-making details documented in ED Course. ? ?Risk ?OTC drugs. ?Prescription drug management. ?Decision regarding hospitalization. ? ? ? ?Final  Clinical Impression(s) / ED Diagnoses ?Final diagnoses:  ?Fall, initial encounter  ?Acute cystitis without hematuria  ? ? ?Rx / DC Orders ?ED Discharge Orders   ? ?      Ordered  ?  cephALEXin (KEFLEX) 500 MG capsule  2 times daily,   Status:  Discontinued       ? 03/16/22 0423  ?  cephALEXin (KEFLEX) 500 MG capsule  2 times daily       ? 03/16/22 0423  ? ?  ?  ? ?  ? ?  ?Truddie Hidden, MD ?03/16/22 0424 ? ?

## 2022-03-16 ENCOUNTER — Other Ambulatory Visit (HOSPITAL_BASED_OUTPATIENT_CLINIC_OR_DEPARTMENT_OTHER): Payer: Medicare Other

## 2022-03-16 ENCOUNTER — Emergency Department (HOSPITAL_BASED_OUTPATIENT_CLINIC_OR_DEPARTMENT_OTHER): Payer: Medicare Other

## 2022-03-16 DIAGNOSIS — S199XXA Unspecified injury of neck, initial encounter: Secondary | ICD-10-CM | POA: Diagnosis not present

## 2022-03-16 DIAGNOSIS — I959 Hypotension, unspecified: Secondary | ICD-10-CM | POA: Diagnosis not present

## 2022-03-16 DIAGNOSIS — S51011A Laceration without foreign body of right elbow, initial encounter: Secondary | ICD-10-CM | POA: Diagnosis not present

## 2022-03-16 DIAGNOSIS — R404 Transient alteration of awareness: Secondary | ICD-10-CM | POA: Diagnosis not present

## 2022-03-16 DIAGNOSIS — S0990XA Unspecified injury of head, initial encounter: Secondary | ICD-10-CM | POA: Diagnosis not present

## 2022-03-16 DIAGNOSIS — Z7401 Bed confinement status: Secondary | ICD-10-CM | POA: Diagnosis not present

## 2022-03-16 DIAGNOSIS — W19XXXA Unspecified fall, initial encounter: Secondary | ICD-10-CM | POA: Diagnosis not present

## 2022-03-16 LAB — URINALYSIS, ROUTINE W REFLEX MICROSCOPIC
Bilirubin Urine: NEGATIVE
Glucose, UA: NEGATIVE mg/dL
Hgb urine dipstick: NEGATIVE
Ketones, ur: 15 mg/dL — AB
Nitrite: NEGATIVE
Protein, ur: 30 mg/dL — AB
Specific Gravity, Urine: 1.029 (ref 1.005–1.030)
pH: 5.5 (ref 5.0–8.0)

## 2022-03-16 LAB — COMPREHENSIVE METABOLIC PANEL
ALT: 13 U/L (ref 0–44)
AST: 42 U/L — ABNORMAL HIGH (ref 15–41)
Albumin: 3.5 g/dL (ref 3.5–5.0)
Alkaline Phosphatase: 45 U/L (ref 38–126)
Anion gap: 12 (ref 5–15)
BUN: 23 mg/dL (ref 8–23)
CO2: 22 mmol/L (ref 22–32)
Calcium: 9.2 mg/dL (ref 8.9–10.3)
Chloride: 104 mmol/L (ref 98–111)
Creatinine, Ser: 1.02 mg/dL (ref 0.61–1.24)
GFR, Estimated: >60 mL/min (ref >60)
Glucose, Bld: 111 mg/dL — ABNORMAL HIGH (ref 70–99)
Potassium: 5 mmol/L (ref 3.5–5.1)
Sodium: 138 mmol/L (ref 135–145)
Total Bilirubin: 0.6 mg/dL (ref 0.3–1.2)
Total Protein: 6.9 g/dL (ref 6.5–8.1)

## 2022-03-16 LAB — ETHANOL: Alcohol, Ethyl (B): 10 mg/dL — ABNORMAL HIGH (ref <10)

## 2022-03-16 LAB — RESP PANEL BY RT-PCR (FLU A&B, COVID) ARPGX2
Influenza A by PCR: NEGATIVE
Influenza B by PCR: NEGATIVE
SARS Coronavirus 2 by RT PCR: NEGATIVE

## 2022-03-16 LAB — MAGNESIUM: Magnesium: 1.4 mg/dL — ABNORMAL LOW (ref 1.7–2.4)

## 2022-03-16 LAB — LACTIC ACID, PLASMA
Lactic Acid, Venous: 1.3 mmol/L (ref 0.5–1.9)
Lactic Acid, Venous: 0.9 mmol/L (ref 0.5–1.9)

## 2022-03-16 IMAGING — CT CT CERVICAL SPINE W/O CM
3 of 4 series · 12 of 34 positions shown, 14 images · non-contrast
Comparison: None.

CLINICAL DATA: Head trauma, minor (Age >= 65y); Neck trauma (Age >=
65y). Unwitnessed fall.



[Series 4: c spine bone · axial · 0.47mm/px · z∈[-468,-356]mm · 4 of 86 slices shown, 5 images]
[im 15/86  soft-tissue]
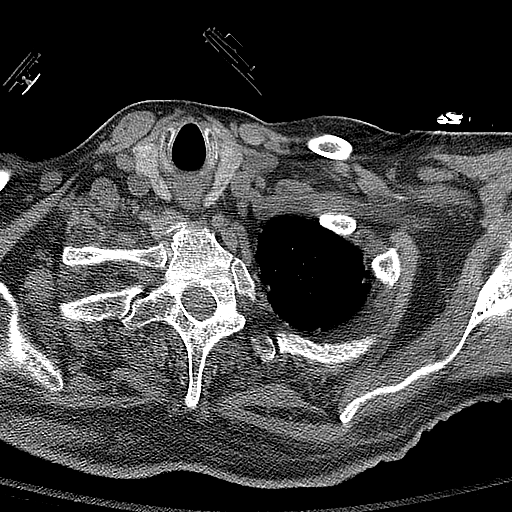
[im 15/86  bone]
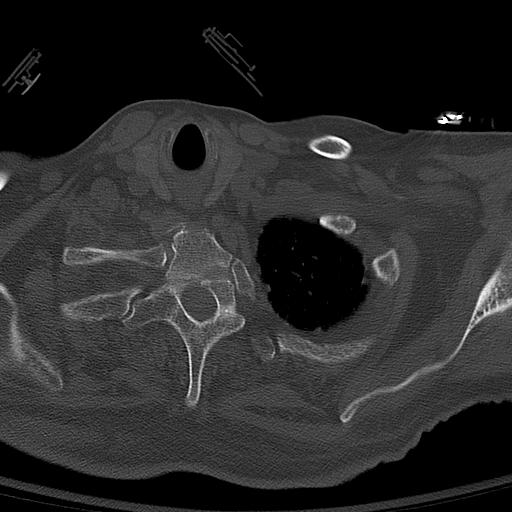
[im 29/86  bone]
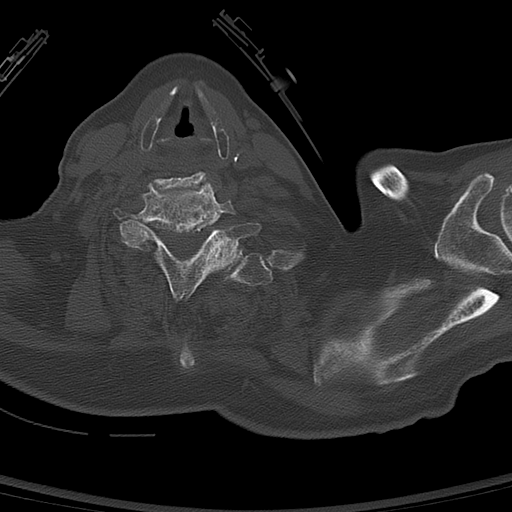
[im 57/86  bone]
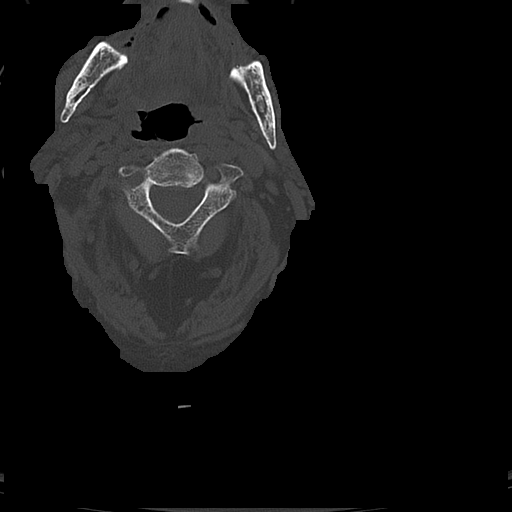
[im 71/86  bone]
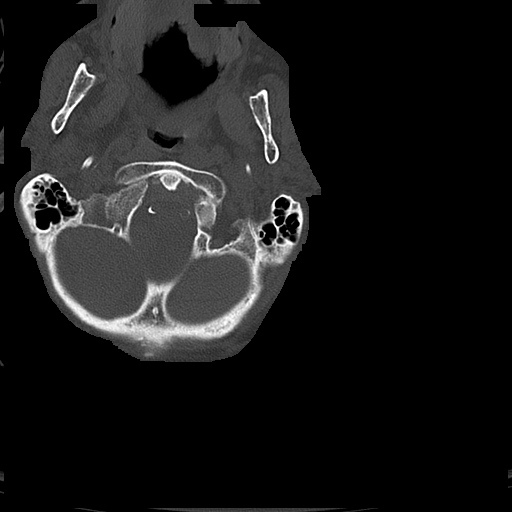

[Series 5: cor bone · coronal · 0.29mm/px · 3 of 85 slices shown]
[im 17/85  bone]
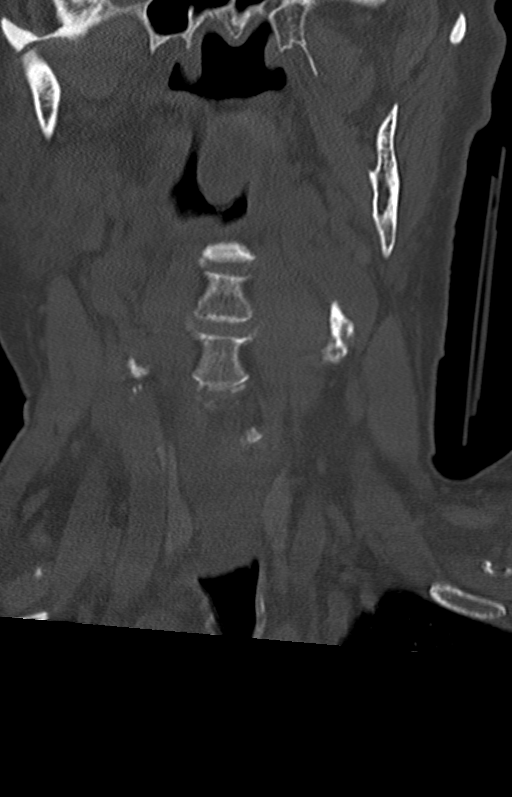
[im 34/85  bone]
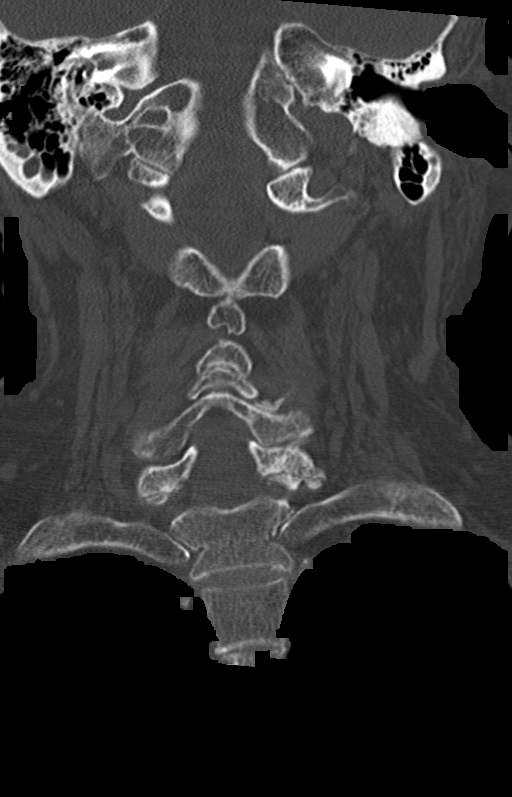
[im 51/85  bone]
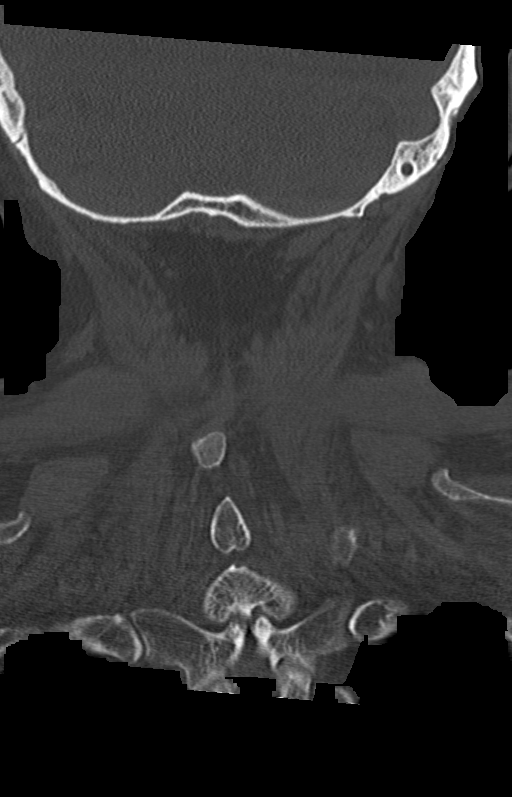

[Series 6: sag bone · sagittal · 0.33mm/px · 5 of 75 slices shown, 6 images]
[im 25/75  bone]
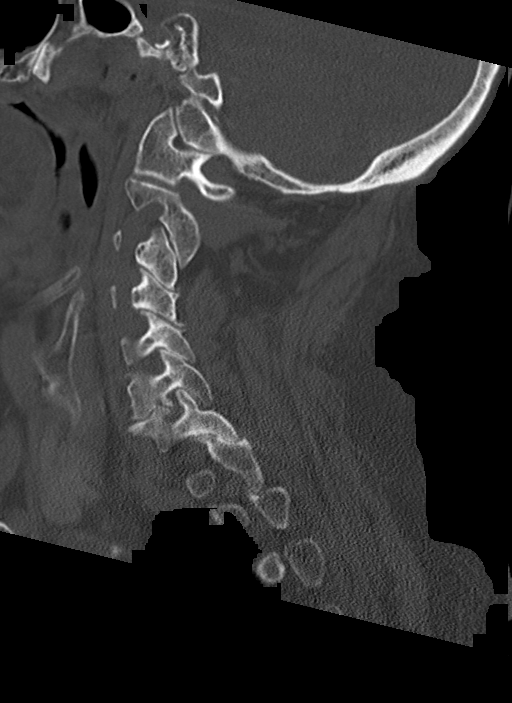
[im 31/75  bone]
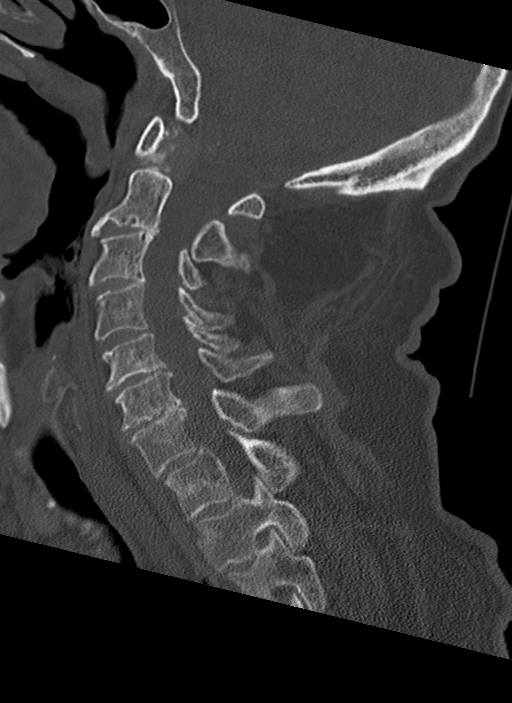
[im 38/75  soft-tissue]
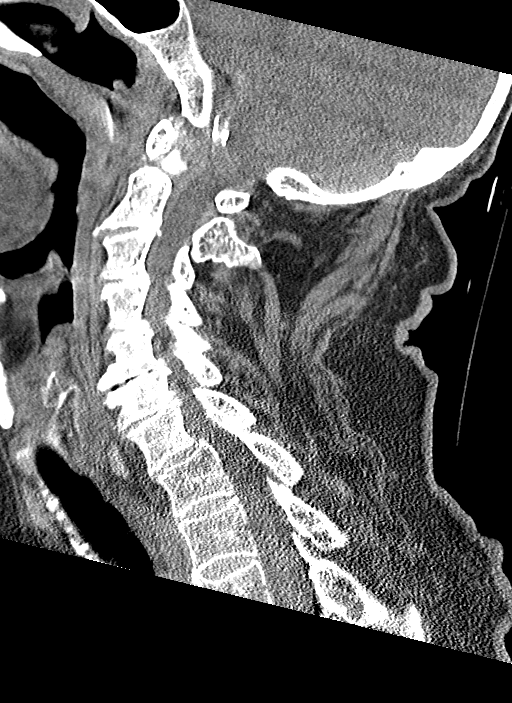
[im 38/75  bone]
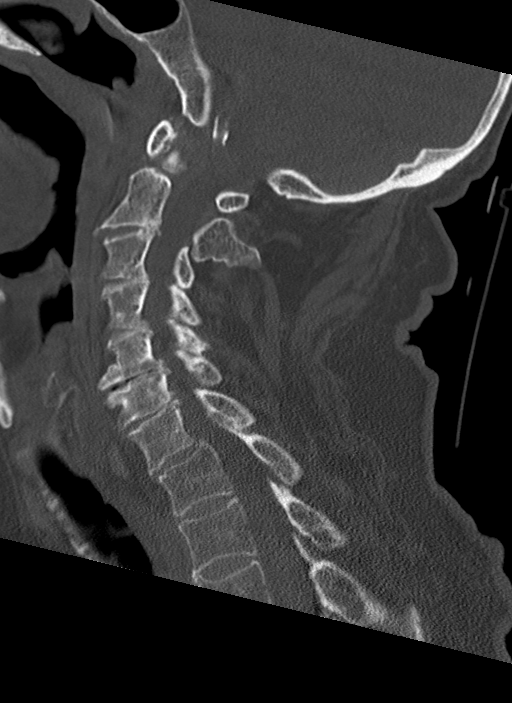
[im 44/75  bone]
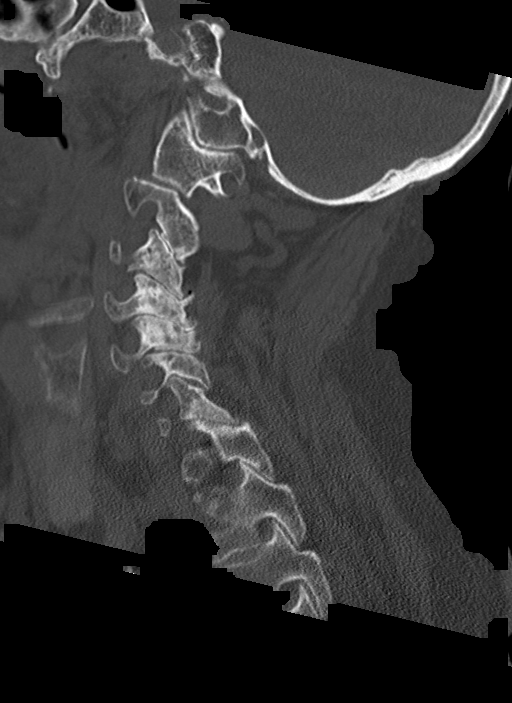
[im 50/75  bone]
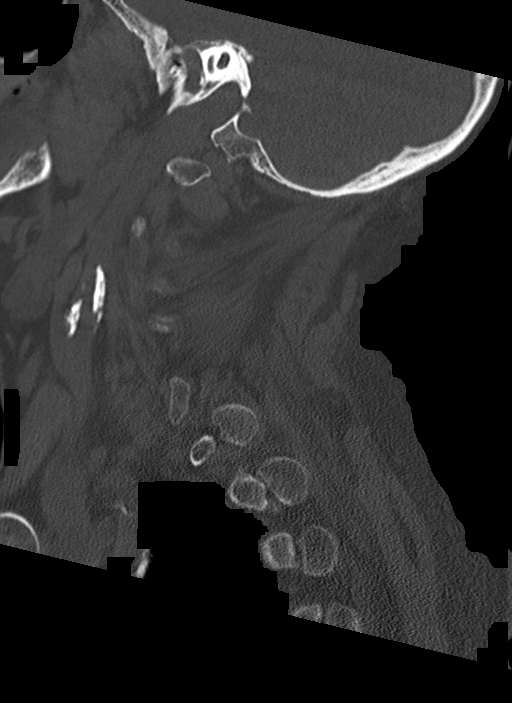

[12 of 34 positions shown; findings below may reference images not displayed]

FINDINGS: CT HEAD FINDINGS

BRAIN:
BRAIN
Cerebral ventricle sizes are concordant with the degree of cerebral
volume loss. Patchy and confluent areas of decreased attenuation are
noted throughout the deep and periventricular white matter of the
cerebral hemispheres bilaterally, compatible with chronic
microvascular ischemic disease.

No evidence of large-territorial acute infarction. No parenchymal
hemorrhage. No mass lesion. No extra-axial collection.

No mass effect or midline shift. No hydrocephalus. Basilar cisterns
are patent.

Vascular: No hyperdense vessel. Atherosclerotic calcifications are
present within the cavernous internal carotid and vertebral
arteries.

Skull: No acute fracture or focal lesion.

Sinuses/Orbits: Paranasal sinuses and mastoid air cells are clear.
Bilateral lens replacement. Otherwise orbits are unremarkable.

Other: None.

CT CERVICAL SPINE FINDINGS

Alignment: Normal.

Skull base and vertebrae: Multilevel degenerative changes of the
spine most prominent at the C5-C6 level. Moderate to severe left
C4-C5, left C5-C6, right C6-C7 osseous neural foraminal stenosis.
Severe osseous central canal stenosis. No acute fracture. No
aggressive appearing focal osseous lesion or focal pathologic
process.

Soft tissues and spinal canal: No prevertebral fluid or swelling. No
visible canal hematoma.

Upper chest: Biapical pleural/pulmonary scarring.

Other: Severe carotid artery calcifications within the neck.
IMPRESSION: 1. No acute intracranial abnormality.
2. No acute displaced fracture or traumatic listhesis of the
cervical spine.
3. Moderate to severe left C4-C5, left C5-C6, right C6-C7 osseous
neural foraminal stenosis due to degenerative changes.
4. Severe carotid artery calcifications within the neck. Consider
carotid ultrasound further evaluation.

## 2022-03-16 IMAGING — CT CT HEAD W/O CM
4 of 8 series · 16 of 47 positions shown, 17 images · non-contrast
Comparison: None.

CLINICAL DATA: Head trauma, minor (Age >= 65y); Neck trauma (Age >=
65y). Unwitnessed fall.



[Series 2: head bone · axial · 0.47mm/px · z∈[-344,-170]mm · 8 of 103 slices shown]
[im 8/103  bone]
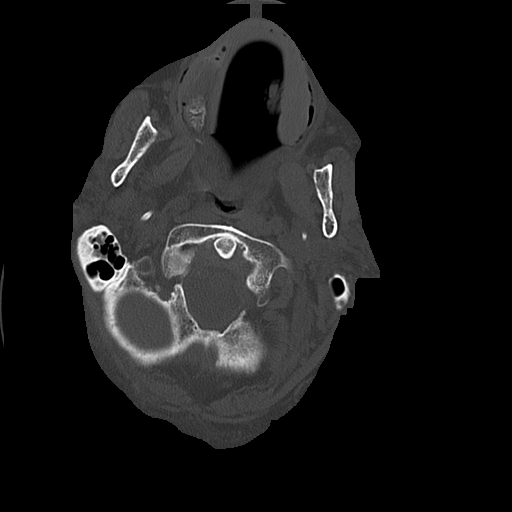
[im 24/103  bone]
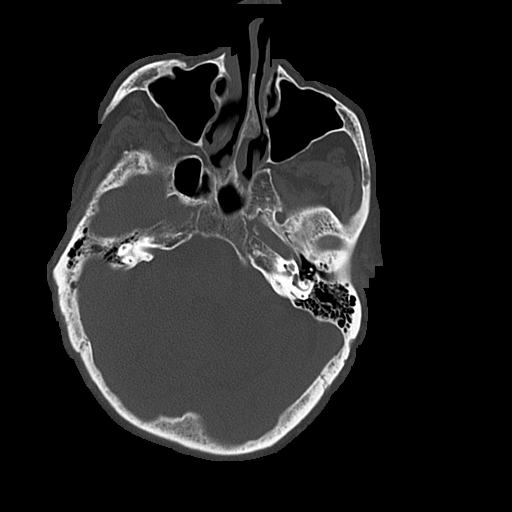
[im 32/103  bone]
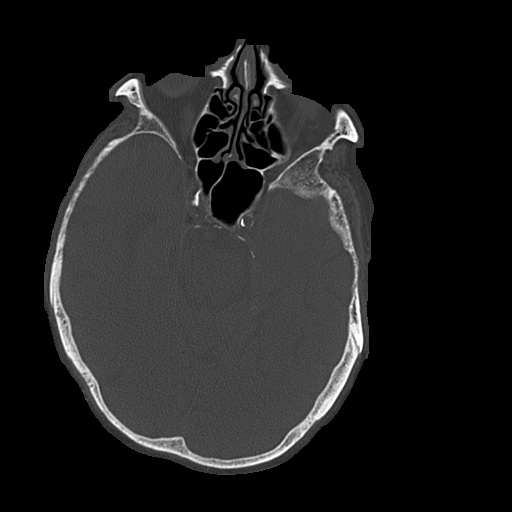
[im 48/103  bone]
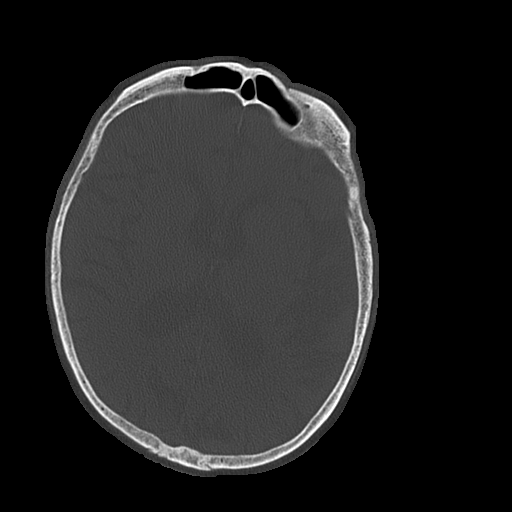
[im 55/103  bone]
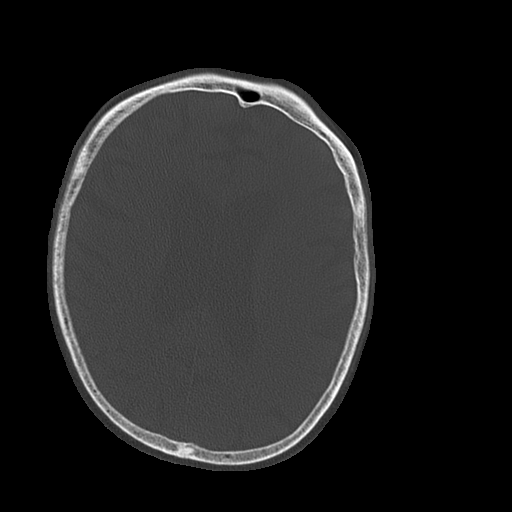
[im 71/103  bone]
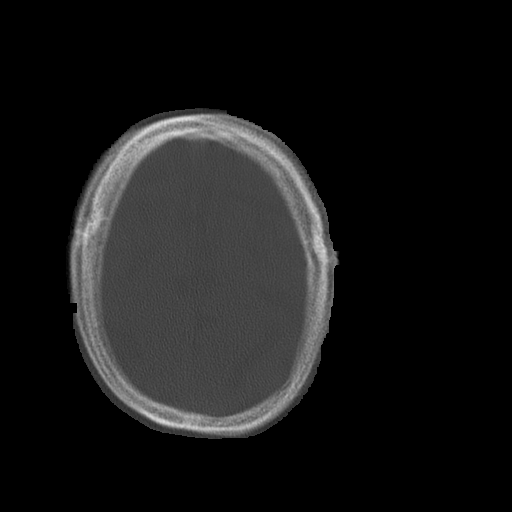
[im 79/103  bone]
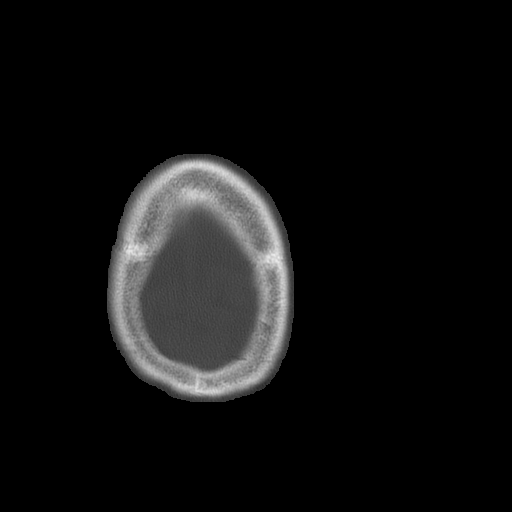
[im 95/103  bone]
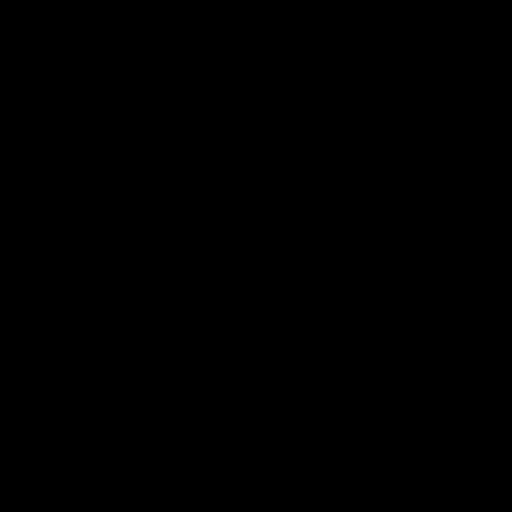

[Series 7: head wo ax · axial · 0.39mm/px · z∈[-295,-218]mm · 3 of 34 slices shown, 4 images]
[im 9/34  brain]
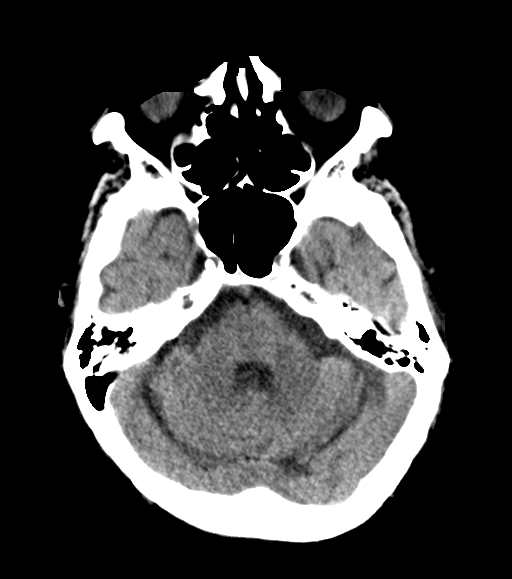
[im 9/34  bone]
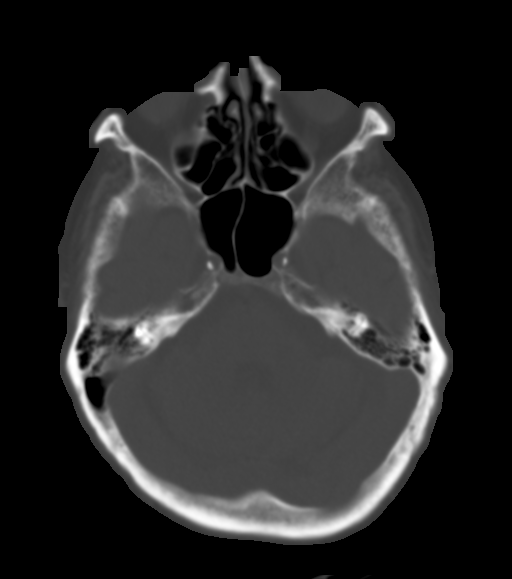
[im 17/34  brain]
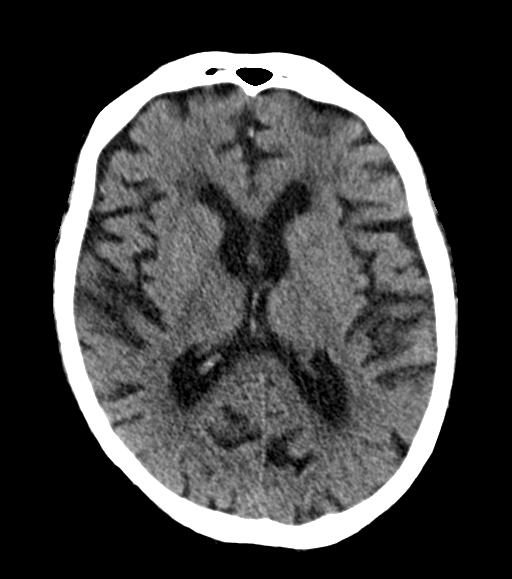
[im 25/34  brain]
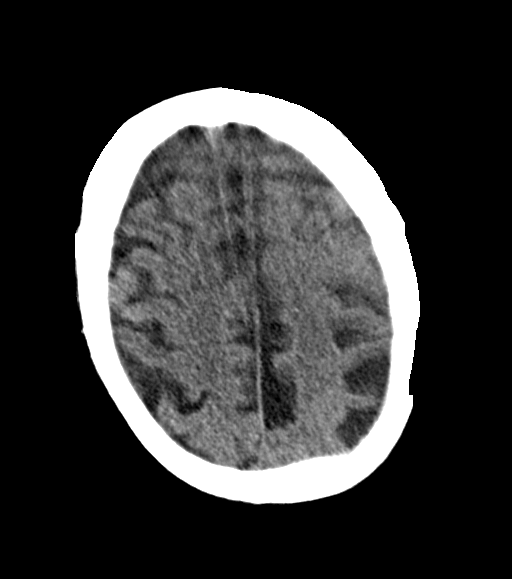

[Series 8: coronal soft · coronal · 0.33mm/px · 3 of 76 slices shown]
[im 19/76  brain]
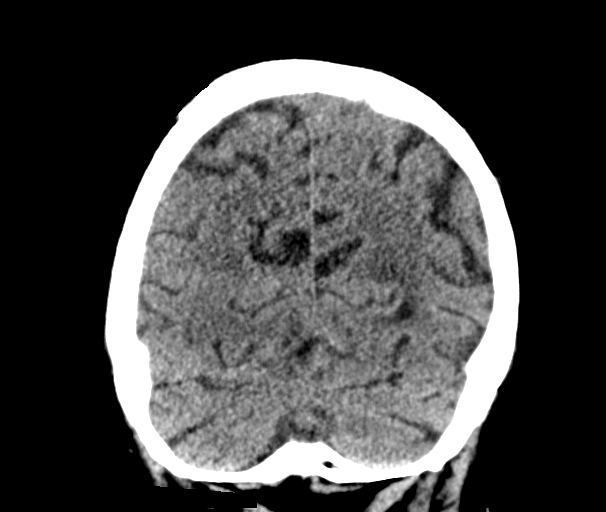
[im 38/76  brain]
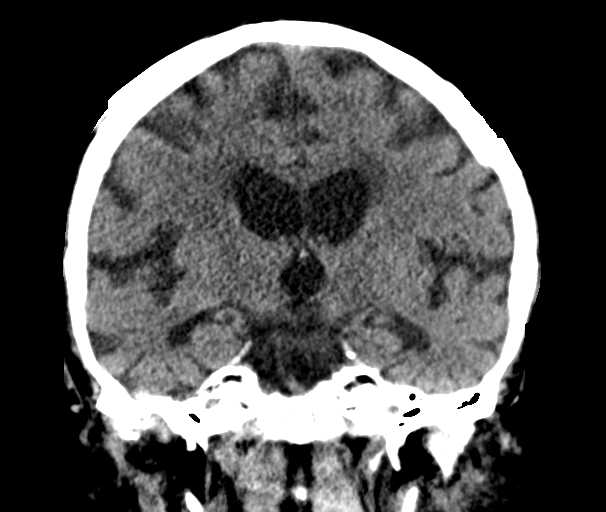
[im 57/76  brain]
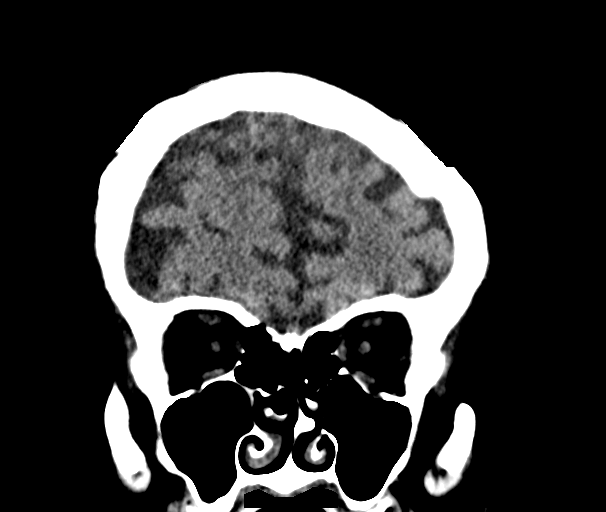

[Series 9: sagittal soft · sagittal · 0.33mm/px · 2 of 67 slices shown]
[im 23/67  brain]
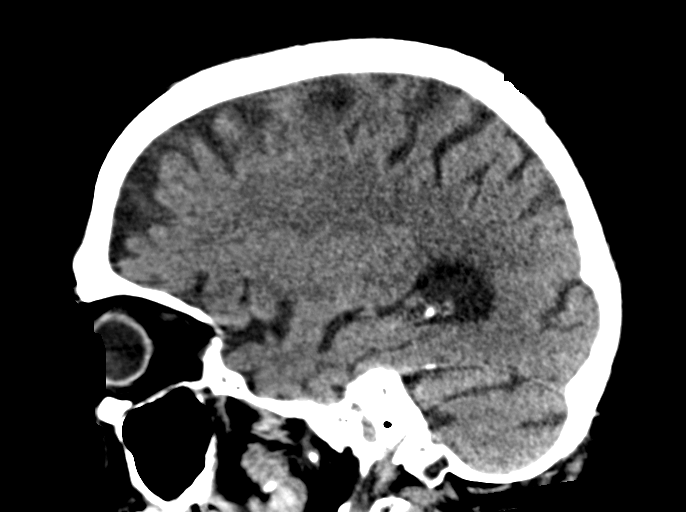
[im 45/67  brain]
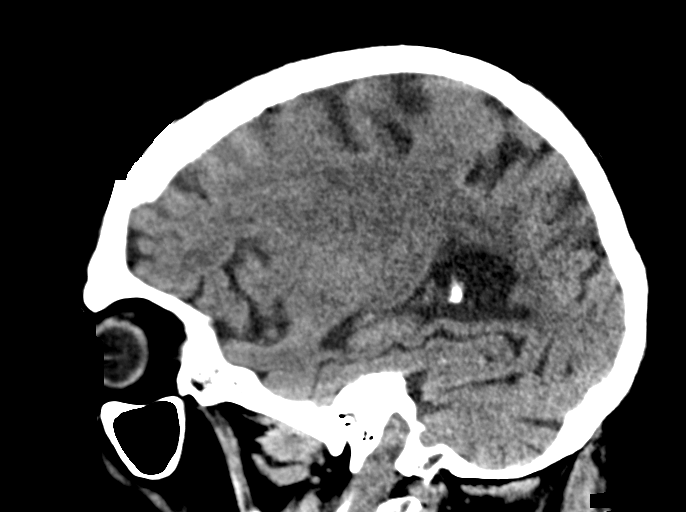

[16 of 47 positions shown; findings below may reference images not displayed]

FINDINGS: CT HEAD FINDINGS

BRAIN:
BRAIN
Cerebral ventricle sizes are concordant with the degree of cerebral
volume loss. Patchy and confluent areas of decreased attenuation are
noted throughout the deep and periventricular white matter of the
cerebral hemispheres bilaterally, compatible with chronic
microvascular ischemic disease.

No evidence of large-territorial acute infarction. No parenchymal
hemorrhage. No mass lesion. No extra-axial collection.

No mass effect or midline shift. No hydrocephalus. Basilar cisterns
are patent.

Vascular: No hyperdense vessel. Atherosclerotic calcifications are
present within the cavernous internal carotid and vertebral
arteries.

Skull: No acute fracture or focal lesion.

Sinuses/Orbits: Paranasal sinuses and mastoid air cells are clear.
Bilateral lens replacement. Otherwise orbits are unremarkable.

Other: None.

CT CERVICAL SPINE FINDINGS

Alignment: Normal.

Skull base and vertebrae: Multilevel degenerative changes of the
spine most prominent at the C5-C6 level. Moderate to severe left
C4-C5, left C5-C6, right C6-C7 osseous neural foraminal stenosis.
Severe osseous central canal stenosis. No acute fracture. No
aggressive appearing focal osseous lesion or focal pathologic
process.

Soft tissues and spinal canal: No prevertebral fluid or swelling. No
visible canal hematoma.

Upper chest: Biapical pleural/pulmonary scarring.

Other: Severe carotid artery calcifications within the neck.
IMPRESSION: 1. No acute intracranial abnormality.
2. No acute displaced fracture or traumatic listhesis of the
cervical spine.
3. Moderate to severe left C4-C5, left C5-C6, right C6-C7 osseous
neural foraminal stenosis due to degenerative changes.
4. Severe carotid artery calcifications within the neck. Consider
carotid ultrasound further evaluation.

## 2022-03-16 MED ORDER — ACETAMINOPHEN 325 MG PO TABS
650.0000 mg | ORAL_TABLET | Freq: Once | ORAL | Status: AC
  Administered 2022-03-16: 650 mg via ORAL
  Filled 2022-03-16: qty 2

## 2022-03-16 MED ORDER — LACTATED RINGERS IV BOLUS (SEPSIS)
1000.0000 mL | Freq: Once | INTRAVENOUS | Status: AC
  Administered 2022-03-16: 1000 mL via INTRAVENOUS

## 2022-03-16 MED ORDER — CEPHALEXIN 500 MG PO CAPS: 500.0000 mg | ORAL_CAPSULE | Freq: Two times a day (BID) | ORAL | 0 refills | Status: AC

## 2022-03-16 MED ORDER — SODIUM CHLORIDE 0.9 % IV SOLN
1.0000 g | Freq: Once | INTRAVENOUS | Status: AC
  Administered 2022-03-16: 1 g via INTRAVENOUS
  Filled 2022-03-16: qty 10

## 2022-03-16 MED ORDER — CEPHALEXIN 500 MG PO CAPS: 500.0000 mg | ORAL_CAPSULE | Freq: Two times a day (BID) | ORAL | 0 refills | Status: DC

## 2022-03-16 MED ORDER — MAGNESIUM SULFATE 2 GM/50ML IV SOLN
2.0000 g | Freq: Once | INTRAVENOUS | Status: AC
  Administered 2022-03-16: 2 g via INTRAVENOUS
  Filled 2022-03-16: qty 50

## 2022-03-16 NOTE — ED Notes (Signed)
Visually checked on Pt. Pt resting comfortably with normal breathing pattern/rise fall of chest ?

## 2022-03-16 NOTE — ED Notes (Addendum)
Called [facility] for transportation at 441am ?Called [facility] at 618am, patient has two others ahead ?

## 2022-03-16 NOTE — ED Notes (Signed)
Able to reach family member [name] [place], [place]), updated on care and plan thus far. Family would like call back when definitive plan of care and disposition is set [phone]) ?

## 2022-03-17 DIAGNOSIS — I739 Peripheral vascular disease, unspecified: Secondary | ICD-10-CM | POA: Diagnosis not present

## 2022-03-17 DIAGNOSIS — G4700 Insomnia, unspecified: Secondary | ICD-10-CM | POA: Diagnosis not present

## 2022-03-17 DIAGNOSIS — E039 Hypothyroidism, unspecified: Secondary | ICD-10-CM | POA: Diagnosis not present

## 2022-03-17 DIAGNOSIS — M109 Gout, unspecified: Secondary | ICD-10-CM | POA: Diagnosis not present

## 2022-03-17 DIAGNOSIS — R1310 Dysphagia, unspecified: Secondary | ICD-10-CM | POA: Diagnosis not present

## 2022-03-17 DIAGNOSIS — E512 Wernicke's encephalopathy: Secondary | ICD-10-CM | POA: Diagnosis not present

## 2022-03-17 DIAGNOSIS — M6281 Muscle weakness (generalized): Secondary | ICD-10-CM | POA: Diagnosis not present

## 2022-03-17 DIAGNOSIS — M1990 Unspecified osteoarthritis, unspecified site: Secondary | ICD-10-CM | POA: Diagnosis not present

## 2022-03-17 DIAGNOSIS — G40409 Other generalized epilepsy and epileptic syndromes, not intractable, without status epilepticus: Secondary | ICD-10-CM | POA: Diagnosis not present

## 2022-03-17 DIAGNOSIS — G40509 Epileptic seizures related to external causes, not intractable, without status epilepticus: Secondary | ICD-10-CM | POA: Diagnosis not present

## 2022-03-17 DIAGNOSIS — E785 Hyperlipidemia, unspecified: Secondary | ICD-10-CM | POA: Diagnosis not present

## 2022-03-17 DIAGNOSIS — I10 Essential (primary) hypertension: Secondary | ICD-10-CM | POA: Diagnosis not present

## 2022-03-17 DIAGNOSIS — Z85828 Personal history of other malignant neoplasm of skin: Secondary | ICD-10-CM | POA: Diagnosis not present

## 2022-03-17 DIAGNOSIS — K219 Gastro-esophageal reflux disease without esophagitis: Secondary | ICD-10-CM | POA: Diagnosis not present

## 2022-03-17 DIAGNOSIS — D649 Anemia, unspecified: Secondary | ICD-10-CM | POA: Diagnosis not present

## 2022-03-17 DIAGNOSIS — Z9181 History of falling: Secondary | ICD-10-CM | POA: Diagnosis not present

## 2022-03-17 DIAGNOSIS — F10132 Alcohol abuse with withdrawal with perceptual disturbance: Secondary | ICD-10-CM | POA: Diagnosis not present

## 2022-03-17 DIAGNOSIS — Z8701 Personal history of pneumonia (recurrent): Secondary | ICD-10-CM | POA: Diagnosis not present

## 2022-03-18 DIAGNOSIS — D649 Anemia, unspecified: Secondary | ICD-10-CM | POA: Diagnosis not present

## 2022-03-18 DIAGNOSIS — K219 Gastro-esophageal reflux disease without esophagitis: Secondary | ICD-10-CM | POA: Diagnosis not present

## 2022-03-18 DIAGNOSIS — E512 Wernicke's encephalopathy: Secondary | ICD-10-CM | POA: Diagnosis not present

## 2022-03-18 DIAGNOSIS — I10 Essential (primary) hypertension: Secondary | ICD-10-CM | POA: Diagnosis not present

## 2022-03-18 DIAGNOSIS — F32A Depression, unspecified: Secondary | ICD-10-CM | POA: Diagnosis not present

## 2022-03-18 DIAGNOSIS — M109 Gout, unspecified: Secondary | ICD-10-CM | POA: Diagnosis not present

## 2022-03-18 DIAGNOSIS — E039 Hypothyroidism, unspecified: Secondary | ICD-10-CM | POA: Diagnosis not present

## 2022-03-18 DIAGNOSIS — N400 Enlarged prostate without lower urinary tract symptoms: Secondary | ICD-10-CM | POA: Diagnosis not present

## 2022-03-18 DIAGNOSIS — M170 Bilateral primary osteoarthritis of knee: Secondary | ICD-10-CM | POA: Diagnosis not present

## 2022-03-18 DIAGNOSIS — G479 Sleep disorder, unspecified: Secondary | ICD-10-CM | POA: Diagnosis not present

## 2022-03-18 DIAGNOSIS — R1319 Other dysphagia: Secondary | ICD-10-CM | POA: Diagnosis not present

## 2022-03-19 DIAGNOSIS — I739 Peripheral vascular disease, unspecified: Secondary | ICD-10-CM | POA: Diagnosis not present

## 2022-03-19 DIAGNOSIS — G40509 Epileptic seizures related to external causes, not intractable, without status epilepticus: Secondary | ICD-10-CM | POA: Diagnosis not present

## 2022-03-19 DIAGNOSIS — E512 Wernicke's encephalopathy: Secondary | ICD-10-CM | POA: Diagnosis not present

## 2022-03-19 DIAGNOSIS — G40409 Other generalized epilepsy and epileptic syndromes, not intractable, without status epilepticus: Secondary | ICD-10-CM | POA: Diagnosis not present

## 2022-03-19 DIAGNOSIS — R1310 Dysphagia, unspecified: Secondary | ICD-10-CM | POA: Diagnosis not present

## 2022-03-19 DIAGNOSIS — F10132 Alcohol abuse with withdrawal with perceptual disturbance: Secondary | ICD-10-CM | POA: Diagnosis not present

## 2022-03-19 DIAGNOSIS — M6281 Muscle weakness (generalized): Secondary | ICD-10-CM | POA: Diagnosis not present

## 2022-03-20 ENCOUNTER — Encounter (HOSPITAL_COMMUNITY): Payer: Self-pay

## 2022-03-20 ENCOUNTER — Emergency Department (HOSPITAL_COMMUNITY): Payer: Medicare Other

## 2022-03-20 ENCOUNTER — Emergency Department (HOSPITAL_COMMUNITY)
Admission: EM | Admit: 2022-03-20 | Discharge: 2022-03-20 | Disposition: A | Discharge status: Skilled Nursing Facility | Payer: Medicare Other | Attending: Emergency Medicine | Admitting: Emergency Medicine

## 2022-03-20 DIAGNOSIS — R600 Localized edema: Secondary | ICD-10-CM | POA: Diagnosis not present

## 2022-03-20 DIAGNOSIS — F39 Unspecified mood [affective] disorder: Secondary | ICD-10-CM | POA: Diagnosis not present

## 2022-03-20 DIAGNOSIS — W19XXXA Unspecified fall, initial encounter: Secondary | ICD-10-CM | POA: Diagnosis not present

## 2022-03-20 DIAGNOSIS — R531 Weakness: Secondary | ICD-10-CM | POA: Diagnosis not present

## 2022-03-20 DIAGNOSIS — D649 Anemia, unspecified: Secondary | ICD-10-CM | POA: Diagnosis not present

## 2022-03-20 DIAGNOSIS — Z7401 Bed confinement status: Secondary | ICD-10-CM | POA: Diagnosis not present

## 2022-03-20 DIAGNOSIS — F4325 Adjustment disorder with mixed disturbance of emotions and conduct: Secondary | ICD-10-CM | POA: Diagnosis not present

## 2022-03-20 DIAGNOSIS — F5101 Primary insomnia: Secondary | ICD-10-CM | POA: Diagnosis not present

## 2022-03-20 DIAGNOSIS — R001 Bradycardia, unspecified: Secondary | ICD-10-CM | POA: Diagnosis not present

## 2022-03-20 DIAGNOSIS — Z79899 Other long term (current) drug therapy: Secondary | ICD-10-CM | POA: Insufficient documentation

## 2022-03-20 DIAGNOSIS — R7989 Other specified abnormal findings of blood chemistry: Secondary | ICD-10-CM | POA: Insufficient documentation

## 2022-03-20 DIAGNOSIS — I959 Hypotension, unspecified: Secondary | ICD-10-CM | POA: Diagnosis not present

## 2022-03-20 DIAGNOSIS — R197 Diarrhea, unspecified: Secondary | ICD-10-CM | POA: Diagnosis not present

## 2022-03-20 HISTORY — DX: Wernicke's encephalopathy: E51.2

## 2022-03-20 LAB — CBC WITH DIFFERENTIAL/PLATELET
Abs Immature Granulocytes: 0.02 10*3/uL (ref 0.00–0.07)
Basophils Absolute: 0.1 10*3/uL (ref 0.0–0.1)
Basophils Relative: 1 %
Eosinophils Absolute: 0.1 10*3/uL (ref 0.0–0.5)
Eosinophils Relative: 2 %
HCT: 25.2 % — ABNORMAL LOW (ref 39.0–52.0)
Hemoglobin: 7.7 g/dL — ABNORMAL LOW (ref 13.0–17.0)
Immature Granulocytes: 0 %
Lymphocytes Relative: 24 %
Lymphs Abs: 1.4 10*3/uL (ref 0.7–4.0)
MCH: 27.3 pg (ref 26.0–34.0)
MCHC: 30.6 g/dL (ref 30.0–36.0)
MCV: 89.4 fL (ref 80.0–100.0)
Monocytes Absolute: 0.9 10*3/uL (ref 0.1–1.0)
Monocytes Relative: 15 %
Neutro Abs: 3.3 10*3/uL (ref 1.7–7.7)
Neutrophils Relative %: 58 %
Platelets: 329 10*3/uL (ref 150–400)
RBC: 2.82 MIL/uL — ABNORMAL LOW (ref 4.22–5.81)
RDW: 15 % (ref 11.5–15.5)
WBC: 5.7 10*3/uL (ref 4.0–10.5)
nRBC: 0 % (ref 0.0–0.2)

## 2022-03-20 LAB — COMPREHENSIVE METABOLIC PANEL
ALT: 11 U/L (ref 0–44)
AST: 14 U/L — ABNORMAL LOW (ref 15–41)
Albumin: 2.6 g/dL — ABNORMAL LOW (ref 3.5–5.0)
Alkaline Phosphatase: 46 U/L (ref 38–126)
Anion gap: 8 (ref 5–15)
BUN: 13 mg/dL (ref 8–23)
CO2: 24 mmol/L (ref 22–32)
Calcium: 8.7 mg/dL — ABNORMAL LOW (ref 8.9–10.3)
Chloride: 106 mmol/L (ref 98–111)
Creatinine, Ser: 0.84 mg/dL (ref 0.61–1.24)
GFR, Estimated: >60 mL/min (ref >60)
Glucose, Bld: 101 mg/dL — ABNORMAL HIGH (ref 70–99)
Potassium: 4 mmol/L (ref 3.5–5.1)
Sodium: 138 mmol/L (ref 135–145)
Total Bilirubin: 0.5 mg/dL (ref 0.3–1.2)
Total Protein: 5.8 g/dL — ABNORMAL LOW (ref 6.5–8.1)

## 2022-03-20 LAB — URINALYSIS, ROUTINE W REFLEX MICROSCOPIC
Bacteria, UA: NONE SEEN
Bilirubin Urine: NEGATIVE
Glucose, UA: NEGATIVE mg/dL
Hgb urine dipstick: NEGATIVE
Ketones, ur: 5 mg/dL — AB
Leukocytes,Ua: NEGATIVE
Nitrite: NEGATIVE
Protein, ur: 30 mg/dL — AB
Specific Gravity, Urine: 1.02 (ref 1.005–1.030)
pH: 6 (ref 5.0–8.0)

## 2022-03-20 LAB — TROPONIN I (HIGH SENSITIVITY): Troponin I (High Sensitivity): 7 ng/L (ref <18)

## 2022-03-20 LAB — POC OCCULT BLOOD, ED: Fecal Occult Bld: NEGATIVE

## 2022-03-20 LAB — MAGNESIUM: Magnesium: 1.8 mg/dL (ref 1.7–2.4)

## 2022-03-20 LAB — ETHANOL: Alcohol, Ethyl (B): <10 mg/dL (ref <10)

## 2022-03-20 LAB — BRAIN NATRIURETIC PEPTIDE: B Natriuretic Peptide: 449.7 pg/mL — ABNORMAL HIGH (ref 0.0–100.0)

## 2022-03-20 IMAGING — DX DG CHEST 1V PORT
1 series · 1 of 1 positions shown · non-contrast
Comparison: [DATE] and older exams.

CLINICAL DATA: Weakness.  History of hypertension.

EXAM:
PORTABLE CHEST 1 VIEW

[chest]
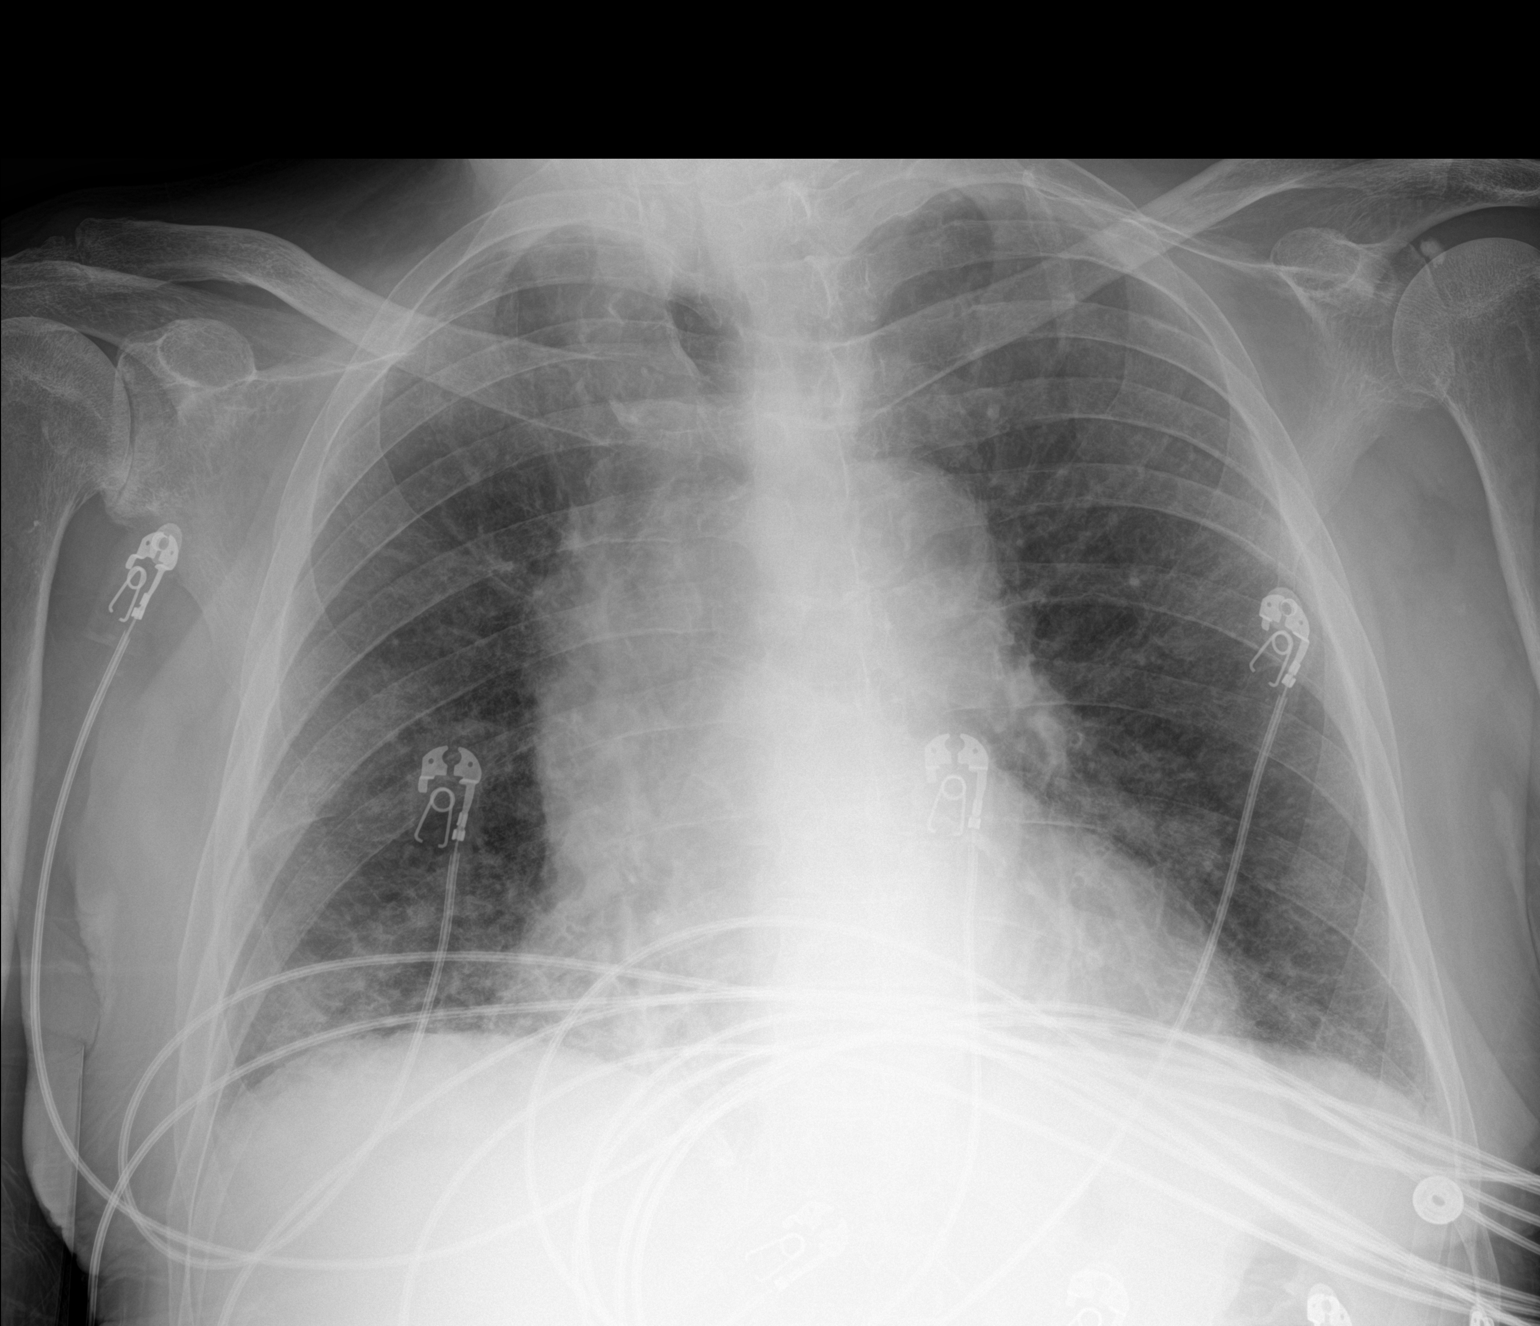

[1 of 1 positions shown; findings below may reference images not displayed]

FINDINGS: Cardiac silhouette is mildly enlarged. No mediastinal or hilar
masses.

Stable prominent bronchial markings in the lung bases. Lungs
otherwise clear. No convincing pleural effusion. No pneumothorax.

Skeletal structures are grossly intact.
IMPRESSION: No acute cardiopulmonary disease.

## 2022-03-20 NOTE — ED Notes (Signed)
Pt ambulated to the bathroom.  

## 2022-03-20 NOTE — ED Provider Notes (Signed)
Care transferred to me. Labs reviewed/interpreted by myself. Hgb is low at 7.7 but consistent with prior results. Occult blood testing is negative. VS normal. BNP is mildly elevated but he does not clinically appear to have CHF.  Electrolytes are overall unremarkable.  At this point think he stable for discharge.  Tried to call son but no answer.  He has chronic Wernicke's which would explain his chronic altered mental status. ?  ?Sherwood Gambler, MD ?03/20/22 1837 ? ?

## 2022-03-20 NOTE — ED Provider Notes (Addendum)
?[facility] ?Provider Note ? ? ?CSN: [id number] ?Arrival date & time: 03/20/22  1332 ? ?  ? ?History ? ?Chief Complaint  ?Patient presents with  ? Fall  ? ? ?Devin Garza is a 81 y.o. male. ? ?Patient is an 81 year old male with a history of hypertension, hyperlipidemia, PVD, GERD, recent admission to the hospital for weakness, hypokalemia, potential alcohol withdrawal seizures and pneumonia who is currently residing at an assisted living facility and presenting today due to weakness and fall.  Patient's facility reports that he has had 2 falls during a chair transfer in the last few days.  He was seen in the emergency room several weeks ago and at that time had fever and tachycardia and was thought to have a UTI and was given Keflex which she is finished but reports he still been weak with diarrhea.  Patient is not take any blood thinners and had normal blood sugar per EMS.  Patient reports just being generally weak everywhere.  He denies hitting his head or having any localized pain.  He reports he is not drinking alcohol now but sometimes he will drink a bottle of wine.  He denies any shortness of breath, new cough, abdominal pain or vomiting.  Last episode of diarrhea was yesterday.  He reports no black stools.  He does not take anticoagulation. ? ? ?Fall ? ? ?  ? ?Home Medications ?Prior to Admission medications   ?Medication Sig Start Date End Date Taking? Authorizing Provider  ?amLODipine (NORVASC) 10 MG tablet Take 1 tablet (10 mg total) by mouth daily. 07/21/21   Isaac Bliss, Rayford Halsted, MD  ?atorvastatin (LIPITOR) 10 MG tablet Take 1 tablet (10 mg total) by mouth daily. 07/21/21   Isaac Bliss, Rayford Halsted, MD  ?bethanechol (URECHOLINE) 10 MG tablet Take 1 tablet (10 mg total) by mouth 3 (three) times daily. 01/29/22   Jose Persia, MD  ?cephALEXin (KEFLEX) 500 MG capsule Take 1 capsule (500 mg total) by mouth 2 (two) times daily for 7 days. 03/16/22 03/23/22   Truddie Hidden, MD  ?colchicine 0.6 MG tablet TAKE 1 TABLET BY MOUTH 2 TIMES DAILY. ?Patient taking differently: Take 0.6 mg by mouth 2 (two) times daily. 12/10/20   Isaac Bliss, Rayford Halsted, MD  ?Diclofenac Sodium 1.5 % SOLN Apply 1 mL topically 4 (four) times daily as needed. ?Patient taking differently: Apply 1 mL topically 4 (four) times daily as needed (pain). 02/04/21   Mcarthur Rossetti, MD  ?esomeprazole (NEXIUM 24HR) 20 MG capsule Take 2 capsules (40 mg total) by mouth 2 (two) times daily before a meal. 10/24/20   Medina-Vargas, Monina C, NP  ?folic acid (FOLVITE) 1 MG tablet Take 1 tablet (1 mg total) by mouth daily. 01/30/22   Jose Persia, MD  ?ipratropium-albuterol (DUONEB) 0.5-2.5 (3) MG/3ML SOLN Take 3 mLs by nebulization every 6 (six) hours as needed. 01/29/22   Jose Persia, MD  ?loratadine (CLARITIN) 10 MG tablet Take 1 tablet (10 mg total) by mouth daily as needed for allergies. 10/24/20   Medina-Vargas, Monina C, NP  ?magnesium oxide (MAG-OX) 400 MG tablet Take 1 tablet by mouth morning, noon and at bedtime 07/21/21   Isaac Bliss, Rayford Halsted, MD  ?metoprolol tartrate (LOPRESSOR) 25 MG tablet Take 1 tablet (25 mg total) by mouth 2 (two) times daily. 01/29/22   Jose Persia, MD  ?Multiple Vitamin (MULTIVITAMIN) tablet Take 1 tablet by mouth daily.    [provider]  ?tamsulosin (FLOMAX) 0.4  MG CAPS capsule Take 1 capsule (0.4 mg total) by mouth daily after supper. 01/29/22   Jose Persia, MD  ?thiamine 100 MG tablet Take 1 tablet (100 mg total) by mouth daily. 01/30/22   Jose Persia, MD  ?   ? ?Allergies    ?Patient has no known allergies.   ? ?Review of Systems   ?Review of Systems ? ?Physical Exam ?Updated Vital Signs ?BP 126/62   Pulse (!) 47   Temp 98.2 ?F (36.8 ?C) (Oral)   Resp 18   Ht '5\' 8"'$  (1.727 m)   Wt 74.8 kg   SpO2 98%   BMI 25.09 kg/m?  ?Physical Exam ?Vitals and nursing note reviewed.  ?Constitutional:   ?   General: He is not in acute distress. ?    Appearance: He is well-developed.  ?HENT:  ?   Head: Normocephalic and atraumatic.  ?Eyes:  ?   Conjunctiva/sclera: Conjunctivae normal.  ?   Pupils: Pupils are equal, round, and reactive to light.  ?   Comments: Pale conjunctive a  ?Cardiovascular:  ?   Rate and Rhythm: Regular rhythm. Bradycardia present.  ?   Pulses: Normal pulses.  ?   Heart sounds: No murmur heard. ?Pulmonary:  ?   Effort: Pulmonary effort is normal. No respiratory distress.  ?   Breath sounds: Normal breath sounds. No wheezing or rales.  ?Abdominal:  ?   General: There is no distension.  ?   Palpations: Abdomen is soft.  ?   Tenderness: There is no abdominal tenderness. There is no guarding or rebound.  ?Musculoskeletal:     ?   General: No tenderness. Normal range of motion.  ?   Cervical back: Normal range of motion and neck supple.  ?   Right lower leg: Edema present.  ?   Left lower leg: Edema present.  ?   Comments: 1+ pitting edema in bilateral ankles and shins.  Palpable pulses bilaterally.  Skin tear to the right forearm  ?Skin: ?   General: Skin is warm and dry.  ?   Coloration: Skin is pale.  ?   Findings: No erythema or rash.  ?Neurological:  ?   Mental Status: He is alert.  ?   Comments: 5 out of 5 strength in bilateral upper and lower extremities however truncal weakness and difficulty pulling himself up to a sitting position.  Patient is oriented to person and place but occasionally says things that do not make sense.  Talks about how he is living alone using a walker but appears to be wheelchair-bound.  Patient reports he is waiting for his dad to come over however that would make his dad greater than 33 years old and when questioned the patient about it he tells me there must be something wrong with me.  Also reports that he has been hanging out with a lot of unusual doctors lately that are putting green fever in his drink.  ?Psychiatric:     ?   Behavior: Behavior normal.  ? ? ?ED Results / Procedures / Treatments    ?Labs ?(all labs ordered are listed, but only abnormal results are displayed) ?Labs Reviewed  ?URINALYSIS, ROUTINE W REFLEX MICROSCOPIC  ?CBC WITH DIFFERENTIAL/PLATELET  ?COMPREHENSIVE METABOLIC PANEL  ?MAGNESIUM  ?BRAIN NATRIURETIC PEPTIDE  ?ETHANOL  ?TROPONIN I (HIGH SENSITIVITY)  ? ? ?EKG ?EKG Interpretation ? ?Date/Time:  Saturday March 20 2022 13:40:55 EDT ?Ventricular Rate:  51 ?PR Interval:  167 ?QRS Duration: 100 ?QT Interval:  506 ?QTC Calculation: 467 ?R Axis:   16 ?Text Interpretation: Sinus rhythm Left ventricular hypertrophy st changes are improved from prior EKG Confirmed by Blanchie Dessert [phone]) on 03/20/2022 2:11:58 PM ? ?Radiology ?DG Chest Port 1 View ? ?Result Date: 03/20/2022 ?CLINICAL DATA:  Weakness.  History of hypertension. EXAM: PORTABLE CHEST 1 VIEW COMPARISON:  03/15/2022 and older exams. FINDINGS: Cardiac silhouette is mildly enlarged. No mediastinal or hilar masses. Stable prominent bronchial markings in the lung bases. Lungs otherwise clear. No convincing pleural effusion. No pneumothorax. Skeletal structures are grossly intact. IMPRESSION: No acute cardiopulmonary disease. Electronically Signed   By: Lajean Manes M.D.   On: 03/20/2022 14:59   ? ?Procedures ?Procedures  ? ? ?Medications Ordered in ED ?Medications - No data to display ? ?ED Course/ Medical Decision Making/ A&P ?  ?                        ?Medical Decision Making ?Amount and/or Complexity of Data Reviewed ?Independent Historian: caregiver and EMS ?External Data Reviewed: notes. ?Labs: ordered. ?Radiology: ordered. ?ECG/medicine tests: ordered and independent interpretation performed. Decision-making details documented in ED Course. ? ? ?Elderly male with multiple medical problems presenting today after falls with transfers.  He has a small skin tear on his right arm but no other signs of trauma.  He complains of generalized weakness and feeling woozy.  Patient's vital signs are normal here except for mild bradycardia  in the upper 40s.  He does take a beta-blocker.  He does appear pale and concern for possible anemia versus electrolyte abnormality which she has had in the past.  Uncertain if patient has dementia versus delirium given rece

## 2022-03-20 NOTE — ED Notes (Signed)
PTAR called at 18:46; 8th in line. ?

## 2022-03-20 NOTE — ED Notes (Signed)
Note all triage entries performed by this RN. Accidental charted under wrong user.  ?

## 2022-03-20 NOTE — ED Triage Notes (Signed)
Pt BIB [facility] from [facility]. Pt reportedly had 2 falls during a chair transfer. Reports generalized weakness x 1 year. Pt recently taking Keflex for UTI. Reports of diarrhea x 3 months. EMS report skin tear to RUE. Not taking blood thinners.  ? ?EMS Vitals: ?CBG 146 ?126/56 ?HR 48 ?RR 20 ?SpO2 100% on R/A.  ?

## 2022-03-21 LAB — CULTURE, BLOOD (ROUTINE X 2)
Culture  Setup Time: NONE SEEN
Culture: NO GROWTH
Culture: NO GROWTH
Special Requests: ADEQUATE

## 2022-03-22 DIAGNOSIS — M6281 Muscle weakness (generalized): Secondary | ICD-10-CM | POA: Diagnosis not present

## 2022-03-25 DIAGNOSIS — E119 Type 2 diabetes mellitus without complications: Secondary | ICD-10-CM | POA: Diagnosis not present

## 2022-03-25 DIAGNOSIS — E559 Vitamin D deficiency, unspecified: Secondary | ICD-10-CM | POA: Diagnosis not present

## 2022-03-25 DIAGNOSIS — R778 Other specified abnormalities of plasma proteins: Secondary | ICD-10-CM | POA: Diagnosis not present

## 2022-03-25 DIAGNOSIS — M109 Gout, unspecified: Secondary | ICD-10-CM | POA: Diagnosis not present

## 2022-03-25 DIAGNOSIS — G40509 Epileptic seizures related to external causes, not intractable, without status epilepticus: Secondary | ICD-10-CM | POA: Diagnosis not present

## 2022-03-25 DIAGNOSIS — I6529 Occlusion and stenosis of unspecified carotid artery: Secondary | ICD-10-CM | POA: Diagnosis not present

## 2022-03-25 DIAGNOSIS — R195 Other fecal abnormalities: Secondary | ICD-10-CM | POA: Diagnosis not present

## 2022-03-25 DIAGNOSIS — D509 Iron deficiency anemia, unspecified: Secondary | ICD-10-CM | POA: Diagnosis not present

## 2022-03-25 DIAGNOSIS — D519 Vitamin B12 deficiency anemia, unspecified: Secondary | ICD-10-CM | POA: Diagnosis not present

## 2022-03-25 DIAGNOSIS — R1310 Dysphagia, unspecified: Secondary | ICD-10-CM | POA: Diagnosis not present

## 2022-03-25 DIAGNOSIS — G40409 Other generalized epilepsy and epileptic syndromes, not intractable, without status epilepticus: Secondary | ICD-10-CM | POA: Diagnosis not present

## 2022-03-25 DIAGNOSIS — R531 Weakness: Secondary | ICD-10-CM | POA: Diagnosis not present

## 2022-03-25 DIAGNOSIS — R001 Bradycardia, unspecified: Secondary | ICD-10-CM | POA: Diagnosis not present

## 2022-03-25 DIAGNOSIS — I739 Peripheral vascular disease, unspecified: Secondary | ICD-10-CM | POA: Diagnosis not present

## 2022-03-25 DIAGNOSIS — Z8719 Personal history of other diseases of the digestive system: Secondary | ICD-10-CM | POA: Diagnosis not present

## 2022-03-25 DIAGNOSIS — F10132 Alcohol abuse with withdrawal with perceptual disturbance: Secondary | ICD-10-CM | POA: Diagnosis not present

## 2022-03-25 DIAGNOSIS — E512 Wernicke's encephalopathy: Secondary | ICD-10-CM | POA: Diagnosis not present

## 2022-03-29 DIAGNOSIS — I739 Peripheral vascular disease, unspecified: Secondary | ICD-10-CM | POA: Diagnosis not present

## 2022-03-29 DIAGNOSIS — G40509 Epileptic seizures related to external causes, not intractable, without status epilepticus: Secondary | ICD-10-CM | POA: Diagnosis not present

## 2022-03-29 DIAGNOSIS — F10132 Alcohol abuse with withdrawal with perceptual disturbance: Secondary | ICD-10-CM | POA: Diagnosis not present

## 2022-03-29 DIAGNOSIS — G40409 Other generalized epilepsy and epileptic syndromes, not intractable, without status epilepticus: Secondary | ICD-10-CM | POA: Diagnosis not present

## 2022-03-29 DIAGNOSIS — R1310 Dysphagia, unspecified: Secondary | ICD-10-CM | POA: Diagnosis not present

## 2022-03-29 DIAGNOSIS — E512 Wernicke's encephalopathy: Secondary | ICD-10-CM | POA: Diagnosis not present

## 2022-03-30 DIAGNOSIS — G40509 Epileptic seizures related to external causes, not intractable, without status epilepticus: Secondary | ICD-10-CM | POA: Diagnosis not present

## 2022-03-30 DIAGNOSIS — F10132 Alcohol abuse with withdrawal with perceptual disturbance: Secondary | ICD-10-CM | POA: Diagnosis not present

## 2022-03-30 DIAGNOSIS — G40409 Other generalized epilepsy and epileptic syndromes, not intractable, without status epilepticus: Secondary | ICD-10-CM | POA: Diagnosis not present

## 2022-03-30 DIAGNOSIS — R1310 Dysphagia, unspecified: Secondary | ICD-10-CM | POA: Diagnosis not present

## 2022-03-30 DIAGNOSIS — I739 Peripheral vascular disease, unspecified: Secondary | ICD-10-CM | POA: Diagnosis not present

## 2022-03-30 DIAGNOSIS — E512 Wernicke's encephalopathy: Secondary | ICD-10-CM | POA: Diagnosis not present

## 2022-04-01 DIAGNOSIS — R1310 Dysphagia, unspecified: Secondary | ICD-10-CM | POA: Diagnosis not present

## 2022-04-01 DIAGNOSIS — E512 Wernicke's encephalopathy: Secondary | ICD-10-CM | POA: Diagnosis not present

## 2022-04-01 DIAGNOSIS — F10132 Alcohol abuse with withdrawal with perceptual disturbance: Secondary | ICD-10-CM | POA: Diagnosis not present

## 2022-04-01 DIAGNOSIS — G40409 Other generalized epilepsy and epileptic syndromes, not intractable, without status epilepticus: Secondary | ICD-10-CM | POA: Diagnosis not present

## 2022-04-01 DIAGNOSIS — I739 Peripheral vascular disease, unspecified: Secondary | ICD-10-CM | POA: Diagnosis not present

## 2022-04-01 DIAGNOSIS — G40509 Epileptic seizures related to external causes, not intractable, without status epilepticus: Secondary | ICD-10-CM | POA: Diagnosis not present

## 2022-04-05 DIAGNOSIS — G40409 Other generalized epilepsy and epileptic syndromes, not intractable, without status epilepticus: Secondary | ICD-10-CM | POA: Diagnosis not present

## 2022-04-05 DIAGNOSIS — G40509 Epileptic seizures related to external causes, not intractable, without status epilepticus: Secondary | ICD-10-CM | POA: Diagnosis not present

## 2022-04-05 DIAGNOSIS — E512 Wernicke's encephalopathy: Secondary | ICD-10-CM | POA: Diagnosis not present

## 2022-04-05 DIAGNOSIS — I739 Peripheral vascular disease, unspecified: Secondary | ICD-10-CM | POA: Diagnosis not present

## 2022-04-05 DIAGNOSIS — R1310 Dysphagia, unspecified: Secondary | ICD-10-CM | POA: Diagnosis not present

## 2022-04-05 DIAGNOSIS — F10132 Alcohol abuse with withdrawal with perceptual disturbance: Secondary | ICD-10-CM | POA: Diagnosis not present

## 2022-04-06 DIAGNOSIS — I739 Peripheral vascular disease, unspecified: Secondary | ICD-10-CM | POA: Diagnosis not present

## 2022-04-06 DIAGNOSIS — F10132 Alcohol abuse with withdrawal with perceptual disturbance: Secondary | ICD-10-CM | POA: Diagnosis not present

## 2022-04-06 DIAGNOSIS — E512 Wernicke's encephalopathy: Secondary | ICD-10-CM | POA: Diagnosis not present

## 2022-04-06 DIAGNOSIS — R1310 Dysphagia, unspecified: Secondary | ICD-10-CM | POA: Diagnosis not present

## 2022-04-06 DIAGNOSIS — G40509 Epileptic seizures related to external causes, not intractable, without status epilepticus: Secondary | ICD-10-CM | POA: Diagnosis not present

## 2022-04-06 DIAGNOSIS — G40409 Other generalized epilepsy and epileptic syndromes, not intractable, without status epilepticus: Secondary | ICD-10-CM | POA: Diagnosis not present

## 2022-04-07 ENCOUNTER — Non-Acute Institutional Stay: Payer: Medicare Other | Admitting: Family Medicine

## 2022-04-07 ENCOUNTER — Encounter: Payer: Self-pay | Admitting: Family Medicine

## 2022-04-07 VITALS — BP 134/80 | HR 82 | Resp 20 | Wt 165.0 lb

## 2022-04-07 DIAGNOSIS — W19XXXS Unspecified fall, sequela: Secondary | ICD-10-CM

## 2022-04-07 DIAGNOSIS — F1010 Alcohol abuse, uncomplicated: Secondary | ICD-10-CM

## 2022-04-07 DIAGNOSIS — E440 Moderate protein-calorie malnutrition: Secondary | ICD-10-CM

## 2022-04-07 DIAGNOSIS — R1310 Dysphagia, unspecified: Secondary | ICD-10-CM
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
## 2022-04-08 DIAGNOSIS — F10132 Alcohol abuse with withdrawal with perceptual disturbance: Secondary | ICD-10-CM | POA: Diagnosis not present

## 2022-04-08 DIAGNOSIS — I739 Peripheral vascular disease, unspecified: Secondary | ICD-10-CM | POA: Diagnosis not present

## 2022-04-08 DIAGNOSIS — R4182 Altered mental status, unspecified: Secondary | ICD-10-CM | POA: Diagnosis not present

## 2022-04-08 DIAGNOSIS — I5020 Unspecified systolic (congestive) heart failure: Secondary | ICD-10-CM | POA: Diagnosis not present

## 2022-04-08 DIAGNOSIS — Z79899 Other long term (current) drug therapy: Secondary | ICD-10-CM | POA: Diagnosis not present

## 2022-04-08 DIAGNOSIS — G40509 Epileptic seizures related to external causes, not intractable, without status epilepticus: Secondary | ICD-10-CM | POA: Diagnosis not present

## 2022-04-08 DIAGNOSIS — R1310 Dysphagia, unspecified: Secondary | ICD-10-CM | POA: Diagnosis not present

## 2022-04-08 DIAGNOSIS — G40409 Other generalized epilepsy and epileptic syndromes, not intractable, without status epilepticus: Secondary | ICD-10-CM | POA: Diagnosis not present

## 2022-04-08 DIAGNOSIS — E512 Wernicke's encephalopathy: Secondary | ICD-10-CM | POA: Diagnosis not present

## 2022-04-11 ENCOUNTER — Emergency Department (HOSPITAL_COMMUNITY): Payer: Medicare Other

## 2022-04-11 ENCOUNTER — Other Ambulatory Visit: Payer: Self-pay

## 2022-04-11 ENCOUNTER — Inpatient Hospital Stay (HOSPITAL_COMMUNITY)
Admission: EM | Admit: 2022-04-11 | Discharge: 2022-04-26 | DRG: 871 | Disposition: E | Payer: Medicare Other | Source: Skilled Nursing Facility | Attending: Family Medicine | Admitting: Family Medicine

## 2022-04-11 DIAGNOSIS — R410 Disorientation, unspecified: Secondary | ICD-10-CM | POA: Diagnosis not present

## 2022-04-11 DIAGNOSIS — Y95 Nosocomial condition: Secondary | ICD-10-CM | POA: Diagnosis not present

## 2022-04-11 DIAGNOSIS — Z20822 Contact with and (suspected) exposure to covid-19: Secondary | ICD-10-CM | POA: Diagnosis present

## 2022-04-11 DIAGNOSIS — Z808 Family history of malignant neoplasm of other organs or systems: Secondary | ICD-10-CM

## 2022-04-11 DIAGNOSIS — G9341 Metabolic encephalopathy: Secondary | ICD-10-CM | POA: Diagnosis present

## 2022-04-11 DIAGNOSIS — Z66 Do not resuscitate: Secondary | ICD-10-CM | POA: Diagnosis present

## 2022-04-11 DIAGNOSIS — J9601 Acute respiratory failure with hypoxia: Secondary | ICD-10-CM | POA: Diagnosis present

## 2022-04-11 DIAGNOSIS — J189 Pneumonia, unspecified organism: Secondary | ICD-10-CM

## 2022-04-11 DIAGNOSIS — G4710 Hypersomnia, unspecified: Secondary | ICD-10-CM | POA: Diagnosis present

## 2022-04-11 DIAGNOSIS — D638 Anemia in other chronic diseases classified elsewhere: Secondary | ICD-10-CM | POA: Diagnosis present

## 2022-04-11 DIAGNOSIS — R000 Tachycardia, unspecified: Secondary | ICD-10-CM | POA: Diagnosis not present

## 2022-04-11 DIAGNOSIS — Z801 Family history of malignant neoplasm of trachea, bronchus and lung: Secondary | ICD-10-CM | POA: Diagnosis not present

## 2022-04-11 DIAGNOSIS — R059 Cough, unspecified: Secondary | ICD-10-CM | POA: Diagnosis not present

## 2022-04-11 DIAGNOSIS — J690 Pneumonitis due to inhalation of food and vomit: Secondary | ICD-10-CM | POA: Diagnosis not present

## 2022-04-11 DIAGNOSIS — Z8249 Family history of ischemic heart disease and other diseases of the circulatory system: Secondary | ICD-10-CM | POA: Diagnosis not present

## 2022-04-11 DIAGNOSIS — Z79899 Other long term (current) drug therapy: Secondary | ICD-10-CM | POA: Diagnosis not present

## 2022-04-11 DIAGNOSIS — Z7189 Other specified counseling: Secondary | ICD-10-CM | POA: Diagnosis not present

## 2022-04-11 DIAGNOSIS — K219 Gastro-esophageal reflux disease without esophagitis: Secondary | ICD-10-CM | POA: Diagnosis not present

## 2022-04-11 DIAGNOSIS — E512 Wernicke's encephalopathy: Secondary | ICD-10-CM | POA: Diagnosis present

## 2022-04-11 DIAGNOSIS — N179 Acute kidney failure, unspecified: Secondary | ICD-10-CM | POA: Diagnosis not present

## 2022-04-11 DIAGNOSIS — Z8041 Family history of malignant neoplasm of ovary: Secondary | ICD-10-CM

## 2022-04-11 DIAGNOSIS — Z87891 Personal history of nicotine dependence: Secondary | ICD-10-CM

## 2022-04-11 DIAGNOSIS — F919 Conduct disorder, unspecified: Secondary | ICD-10-CM | POA: Diagnosis present

## 2022-04-11 DIAGNOSIS — I10 Essential (primary) hypertension: Secondary | ICD-10-CM | POA: Diagnosis not present

## 2022-04-11 DIAGNOSIS — R509 Fever, unspecified: Secondary | ICD-10-CM | POA: Diagnosis not present

## 2022-04-11 DIAGNOSIS — Z85828 Personal history of other malignant neoplasm of skin: Secondary | ICD-10-CM | POA: Diagnosis not present

## 2022-04-11 DIAGNOSIS — I739 Peripheral vascular disease, unspecified: Secondary | ICD-10-CM | POA: Diagnosis present

## 2022-04-11 DIAGNOSIS — F1021 Alcohol dependence, in remission: Secondary | ICD-10-CM | POA: Diagnosis present

## 2022-04-11 DIAGNOSIS — F1011 Alcohol abuse, in remission: Secondary | ICD-10-CM | POA: Diagnosis present

## 2022-04-11 DIAGNOSIS — R1310 Dysphagia, unspecified: Secondary | ICD-10-CM | POA: Diagnosis not present

## 2022-04-11 DIAGNOSIS — D509 Iron deficiency anemia, unspecified: Secondary | ICD-10-CM | POA: Diagnosis not present

## 2022-04-11 DIAGNOSIS — F03918 Unspecified dementia, unspecified severity, with other behavioral disturbance: Secondary | ICD-10-CM | POA: Diagnosis not present

## 2022-04-11 DIAGNOSIS — Z515 Encounter for palliative care: Secondary | ICD-10-CM

## 2022-04-11 DIAGNOSIS — D649 Anemia, unspecified: Secondary | ICD-10-CM | POA: Diagnosis present

## 2022-04-11 DIAGNOSIS — E782 Mixed hyperlipidemia: Secondary | ICD-10-CM | POA: Diagnosis present

## 2022-04-11 DIAGNOSIS — J90 Pleural effusion, not elsewhere classified: Secondary | ICD-10-CM | POA: Diagnosis not present

## 2022-04-11 DIAGNOSIS — R4182 Altered mental status, unspecified: Secondary | ICD-10-CM | POA: Diagnosis not present

## 2022-04-11 DIAGNOSIS — A419 Sepsis, unspecified organism: Principal | ICD-10-CM | POA: Diagnosis present

## 2022-04-11 DIAGNOSIS — E876 Hypokalemia: Secondary | ICD-10-CM | POA: Diagnosis present

## 2022-04-11 LAB — CBC WITH DIFFERENTIAL/PLATELET
Abs Immature Granulocytes: 0.06 10*3/uL (ref 0.00–0.07)
Basophils Absolute: 0.1 10*3/uL (ref 0.0–0.1)
Basophils Relative: 0 %
Eosinophils Absolute: 0 10*3/uL (ref 0.0–0.5)
Eosinophils Relative: 0 %
HCT: 25.3 % — ABNORMAL LOW (ref 39.0–52.0)
Hemoglobin: 7.8 g/dL — ABNORMAL LOW (ref 13.0–17.0)
Immature Granulocytes: 1 %
Lymphocytes Relative: 10 %
Lymphs Abs: 1.2 10*3/uL (ref 0.7–4.0)
MCH: 25.7 pg — ABNORMAL LOW (ref 26.0–34.0)
MCHC: 30.8 g/dL (ref 30.0–36.0)
MCV: 83.5 fL (ref 80.0–100.0)
Monocytes Absolute: 1.1 10*3/uL — ABNORMAL HIGH (ref 0.1–1.0)
Monocytes Relative: 9 %
Neutro Abs: 10.1 10*3/uL — ABNORMAL HIGH (ref 1.7–7.7)
Neutrophils Relative %: 80 %
Platelets: 303 10*3/uL (ref 150–400)
RBC: 3.03 MIL/uL — ABNORMAL LOW (ref 4.22–5.81)
RDW: 17 % — ABNORMAL HIGH (ref 11.5–15.5)
WBC: 12.4 10*3/uL — ABNORMAL HIGH (ref 4.0–10.5)
nRBC: 0 % (ref 0.0–0.2)

## 2022-04-11 LAB — URINALYSIS, ROUTINE W REFLEX MICROSCOPIC
Bilirubin Urine: NEGATIVE
Glucose, UA: NEGATIVE mg/dL
Hgb urine dipstick: NEGATIVE
Ketones, ur: 5 mg/dL — AB
Leukocytes,Ua: NEGATIVE
Nitrite: NEGATIVE
Protein, ur: NEGATIVE mg/dL
Specific Gravity, Urine: 1.015 (ref 1.005–1.030)
pH: 7 (ref 5.0–8.0)

## 2022-04-11 LAB — COMPREHENSIVE METABOLIC PANEL
ALT: 8 U/L (ref 0–44)
AST: 15 U/L (ref 15–41)
Albumin: 2.9 g/dL — ABNORMAL LOW (ref 3.5–5.0)
Alkaline Phosphatase: 38 U/L (ref 38–126)
Anion gap: 8 (ref 5–15)
BUN: 17 mg/dL (ref 8–23)
CO2: 22 mmol/L (ref 22–32)
Calcium: 8.5 mg/dL — ABNORMAL LOW (ref 8.9–10.3)
Chloride: 106 mmol/L (ref 98–111)
Creatinine, Ser: 0.81 mg/dL (ref 0.61–1.24)
GFR, Estimated: >60 mL/min (ref >60)
Glucose, Bld: 108 mg/dL — ABNORMAL HIGH (ref 70–99)
Potassium: 3.6 mmol/L (ref 3.5–5.1)
Sodium: 136 mmol/L (ref 135–145)
Total Bilirubin: 0.3 mg/dL (ref 0.3–1.2)
Total Protein: 6 g/dL — ABNORMAL LOW (ref 6.5–8.1)

## 2022-04-11 LAB — I-STAT CHEM 8, ED
BUN: 14 mg/dL (ref 8–23)
Calcium, Ion: 1.17 mmol/L (ref 1.15–1.40)
Chloride: 102 mmol/L (ref 98–111)
Creatinine, Ser: 0.8 mg/dL (ref 0.61–1.24)
Glucose, Bld: 111 mg/dL — ABNORMAL HIGH (ref 70–99)
HCT: 25 % — ABNORMAL LOW (ref 39.0–52.0)
Hemoglobin: 8.5 g/dL — ABNORMAL LOW (ref 13.0–17.0)
Potassium: 3.6 mmol/L (ref 3.5–5.1)
Sodium: 137 mmol/L (ref 135–145)
TCO2: 23 mmol/L (ref 22–32)

## 2022-04-11 LAB — BLOOD GAS, VENOUS
Acid-base deficit: 1.6 mmol/L (ref 0.0–2.0)
Bicarbonate: 21.7 mmol/L (ref 20.0–28.0)
O2 Saturation: 81.8 %
Patient temperature: 37
pCO2, Ven: 32 mmHg — ABNORMAL LOW (ref 44–60)
pH, Ven: 7.44 — ABNORMAL HIGH (ref 7.25–7.43)
pO2, Ven: 53 mmHg — ABNORMAL HIGH (ref 32–45)

## 2022-04-11 LAB — LACTIC ACID, PLASMA
Lactic Acid, Venous: 2.3 mmol/L (ref 0.5–1.9)
Lactic Acid, Venous: 1 mmol/L (ref 0.5–1.9)

## 2022-04-11 LAB — RESP PANEL BY RT-PCR (FLU A&B, COVID) ARPGX2
Influenza A by PCR: NEGATIVE
Influenza B by PCR: NEGATIVE
SARS Coronavirus 2 by RT PCR: NEGATIVE

## 2022-04-11 LAB — ETHANOL: Alcohol, Ethyl (B): <10 mg/dL (ref <10)

## 2022-04-11 LAB — CBG MONITORING, ED: Glucose-Capillary: 99 mg/dL (ref 70–99)

## 2022-04-11 LAB — APTT: aPTT: 37 seconds — ABNORMAL HIGH (ref 24–36)

## 2022-04-11 LAB — PROTIME-INR
INR: 1.4 — ABNORMAL HIGH (ref 0.8–1.2)
Prothrombin Time: 16.7 seconds — ABNORMAL HIGH (ref 11.4–15.2)

## 2022-04-11 LAB — AMMONIA: Ammonia: 46 umol/L — ABNORMAL HIGH (ref 9–35)

## 2022-04-11 IMAGING — CT CT HEAD W/O CM
3 series · 16 of 47 positions shown, 19 images · non-contrast
Comparison: [DATE]

CLINICAL DATA: Mental status change. Increased confusion over 3
days.



[Series 2: head wo · axial · 0.50mm/px · z∈[+1599,+1749]mm · 10 of 36 slices shown, 13 images]
[im 3/36  brain]
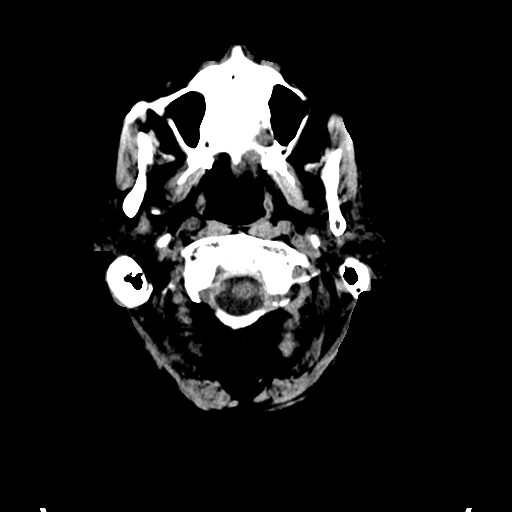
[im 3/36  bone]
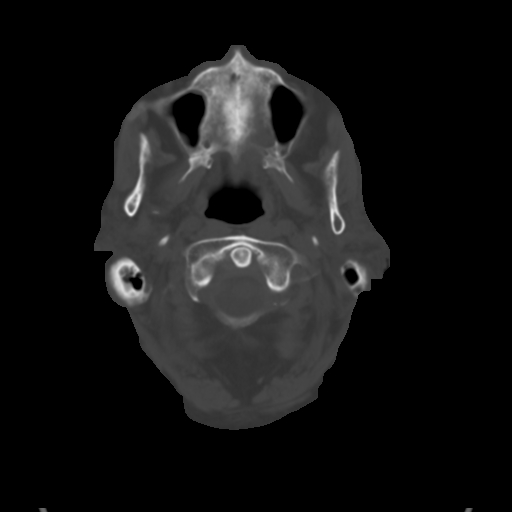
[im 7/36  brain]
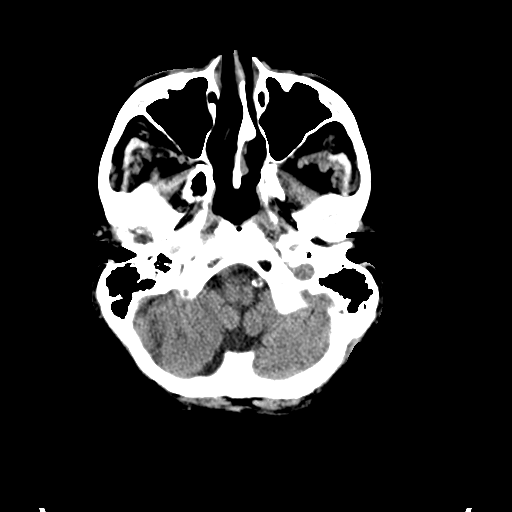
[im 10/36  brain]
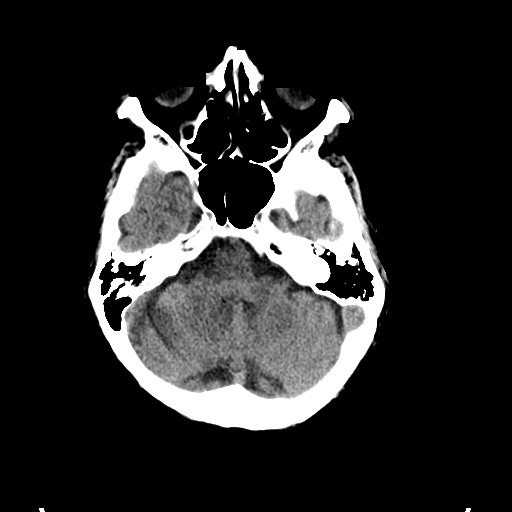
[im 13/36  brain]
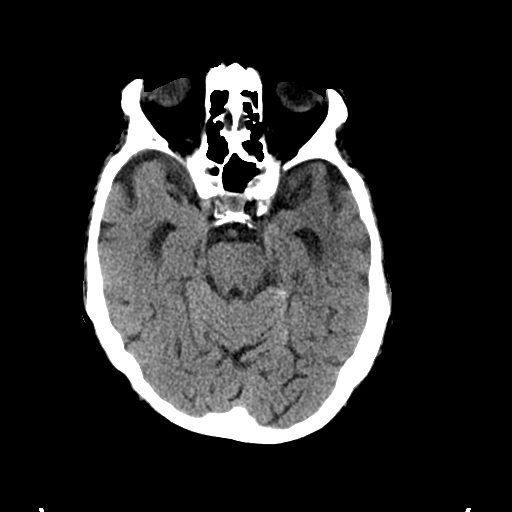
[im 16/36  brain]
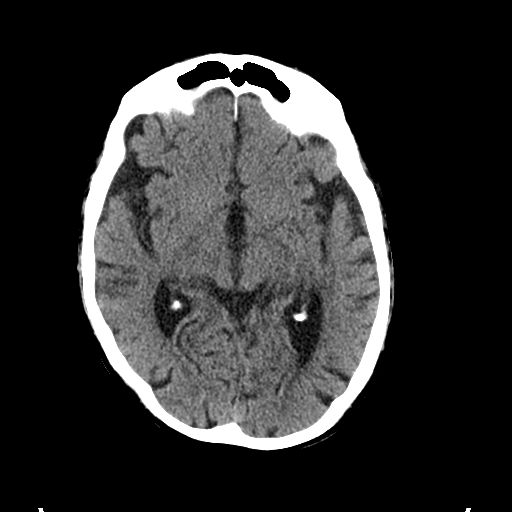
[im 16/36  bone]
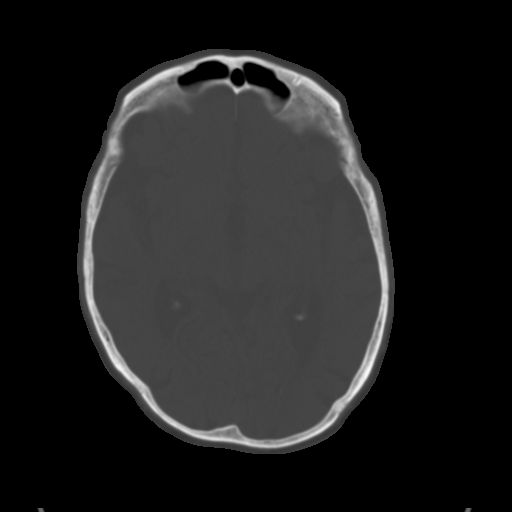
[im 20/36  brain]
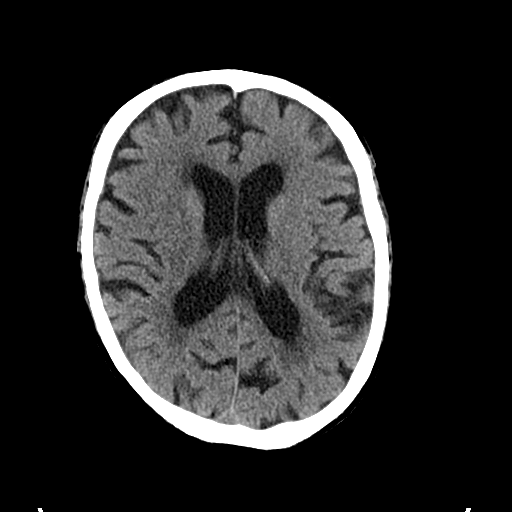
[im 23/36  brain]
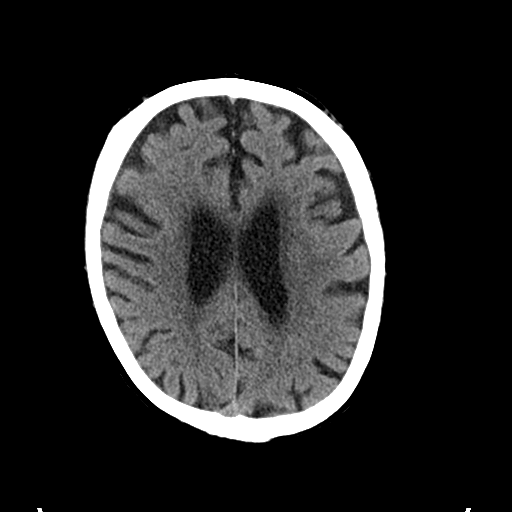
[im 27/36  brain]
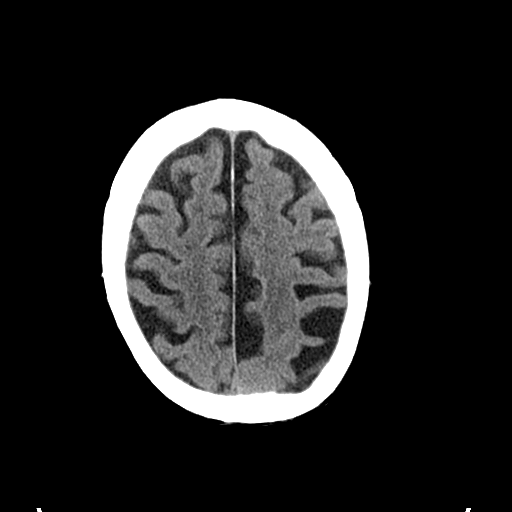
[im 29/36  brain]
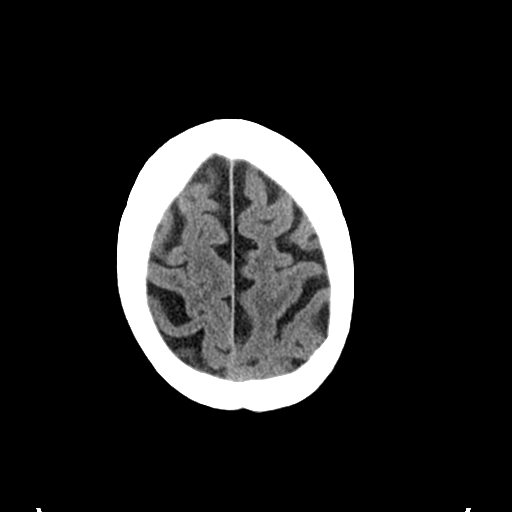
[im 29/36  bone]
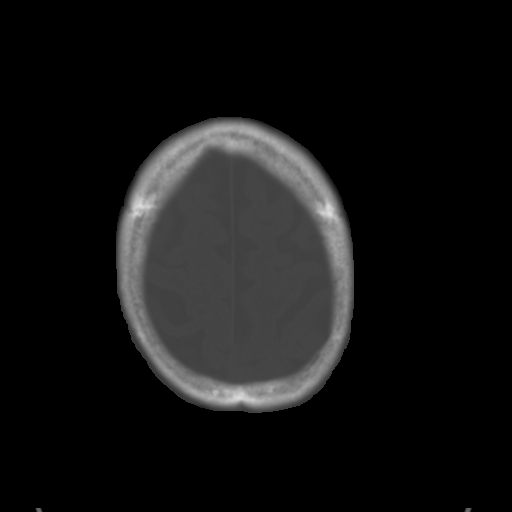
[im 33/36  brain]
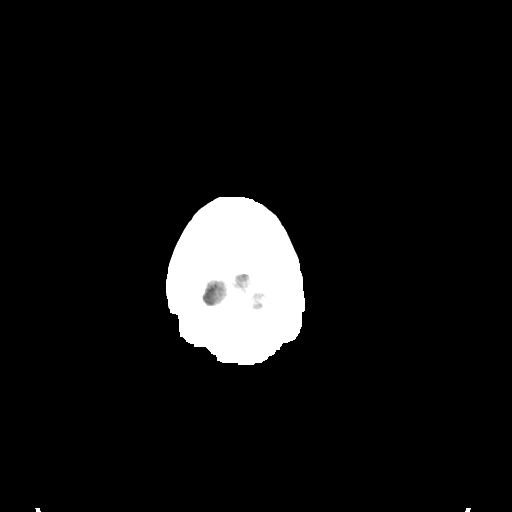

[Series 4: coronal soft tissue · coronal · 0.37mm/px · 3 of 76 slices shown]
[im 26/76  brain]
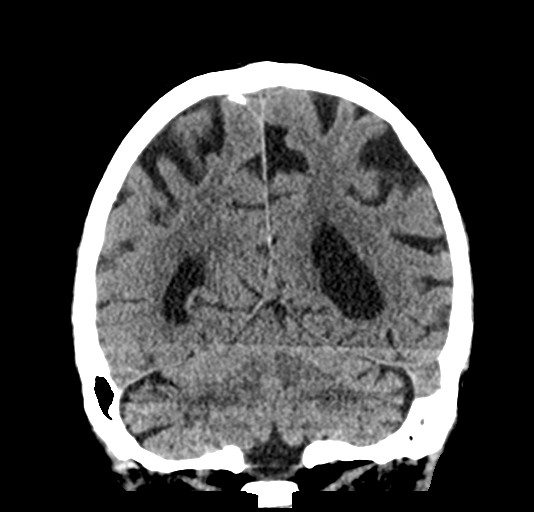
[im 34/76  brain]
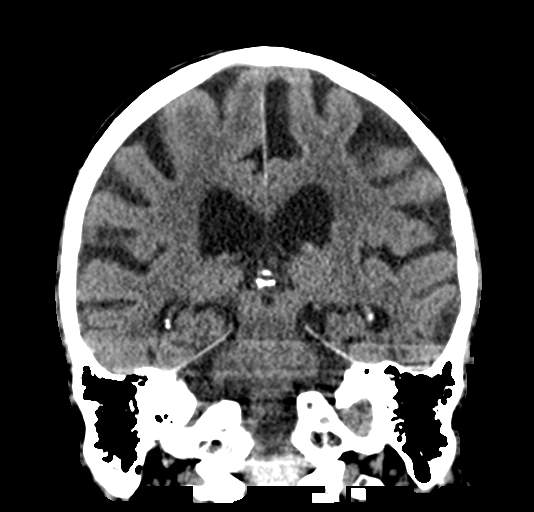
[im 42/76  brain]
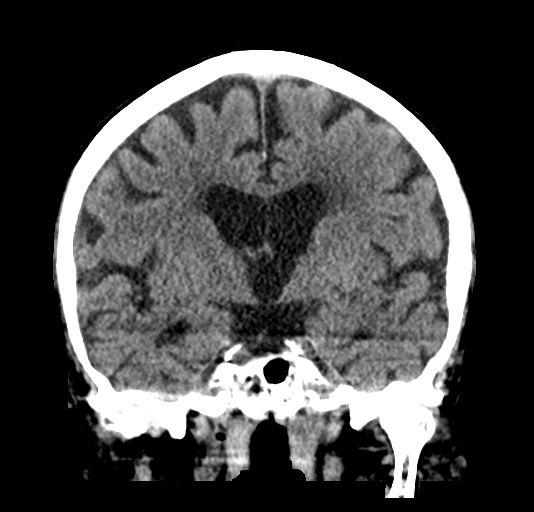

[Series 5: sagittal soft tissue · sagittal · 0.37mm/px · 3 of 63 slices shown]
[im 21/63  brain]
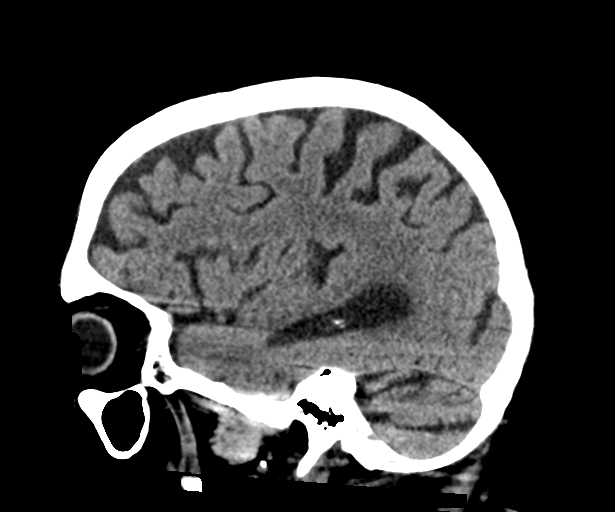
[im 32/63  brain]
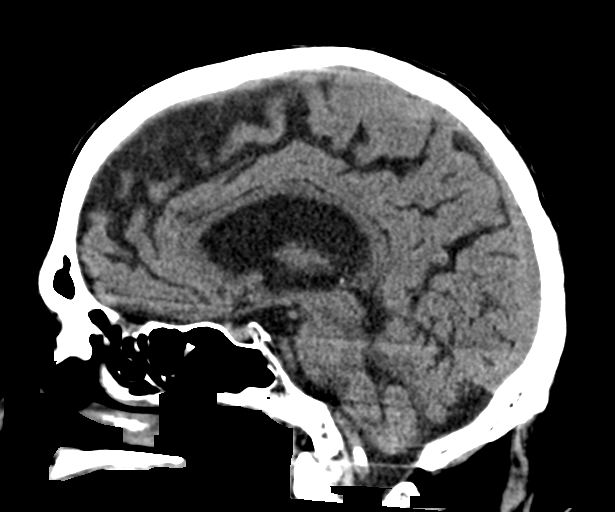
[im 42/63  brain]
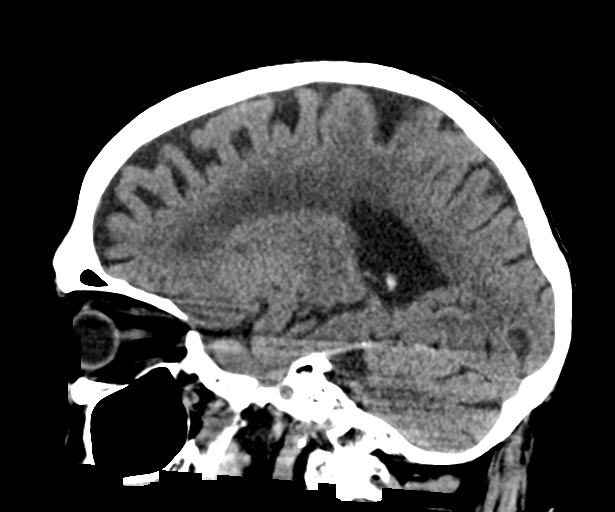

[16 of 47 positions shown; findings below may reference images not displayed]

FINDINGS: Brain: No evidence of acute infarction, hemorrhage, hydrocephalus,
extra-axial collection or mass lesion/mass effect. There is mild
diffuse low-attenuation within the subcortical and periventricular
white matter compatible with chronic microvascular disease.
Prominence of sulci and ventricles compatible with age related brain
atrophy.

Vascular: No hyperdense vessel or unexpected calcification.

Skull: Normal. Negative for fracture or focal lesion.

Sinuses/Orbits: No acute finding.

Other: None.
IMPRESSION: 1. No acute intracranial abnormalities.
2. Chronic small vessel ischemic disease and brain atrophy.

## 2022-04-11 IMAGING — CR DG CHEST 2V
2 series · 2 of 2 positions shown · non-contrast
Comparison: [DATE]

CLINICAL DATA: Cough.  Altered mental status for several days.

EXAM:
CHEST - 2 VIEW

[w chest lat]
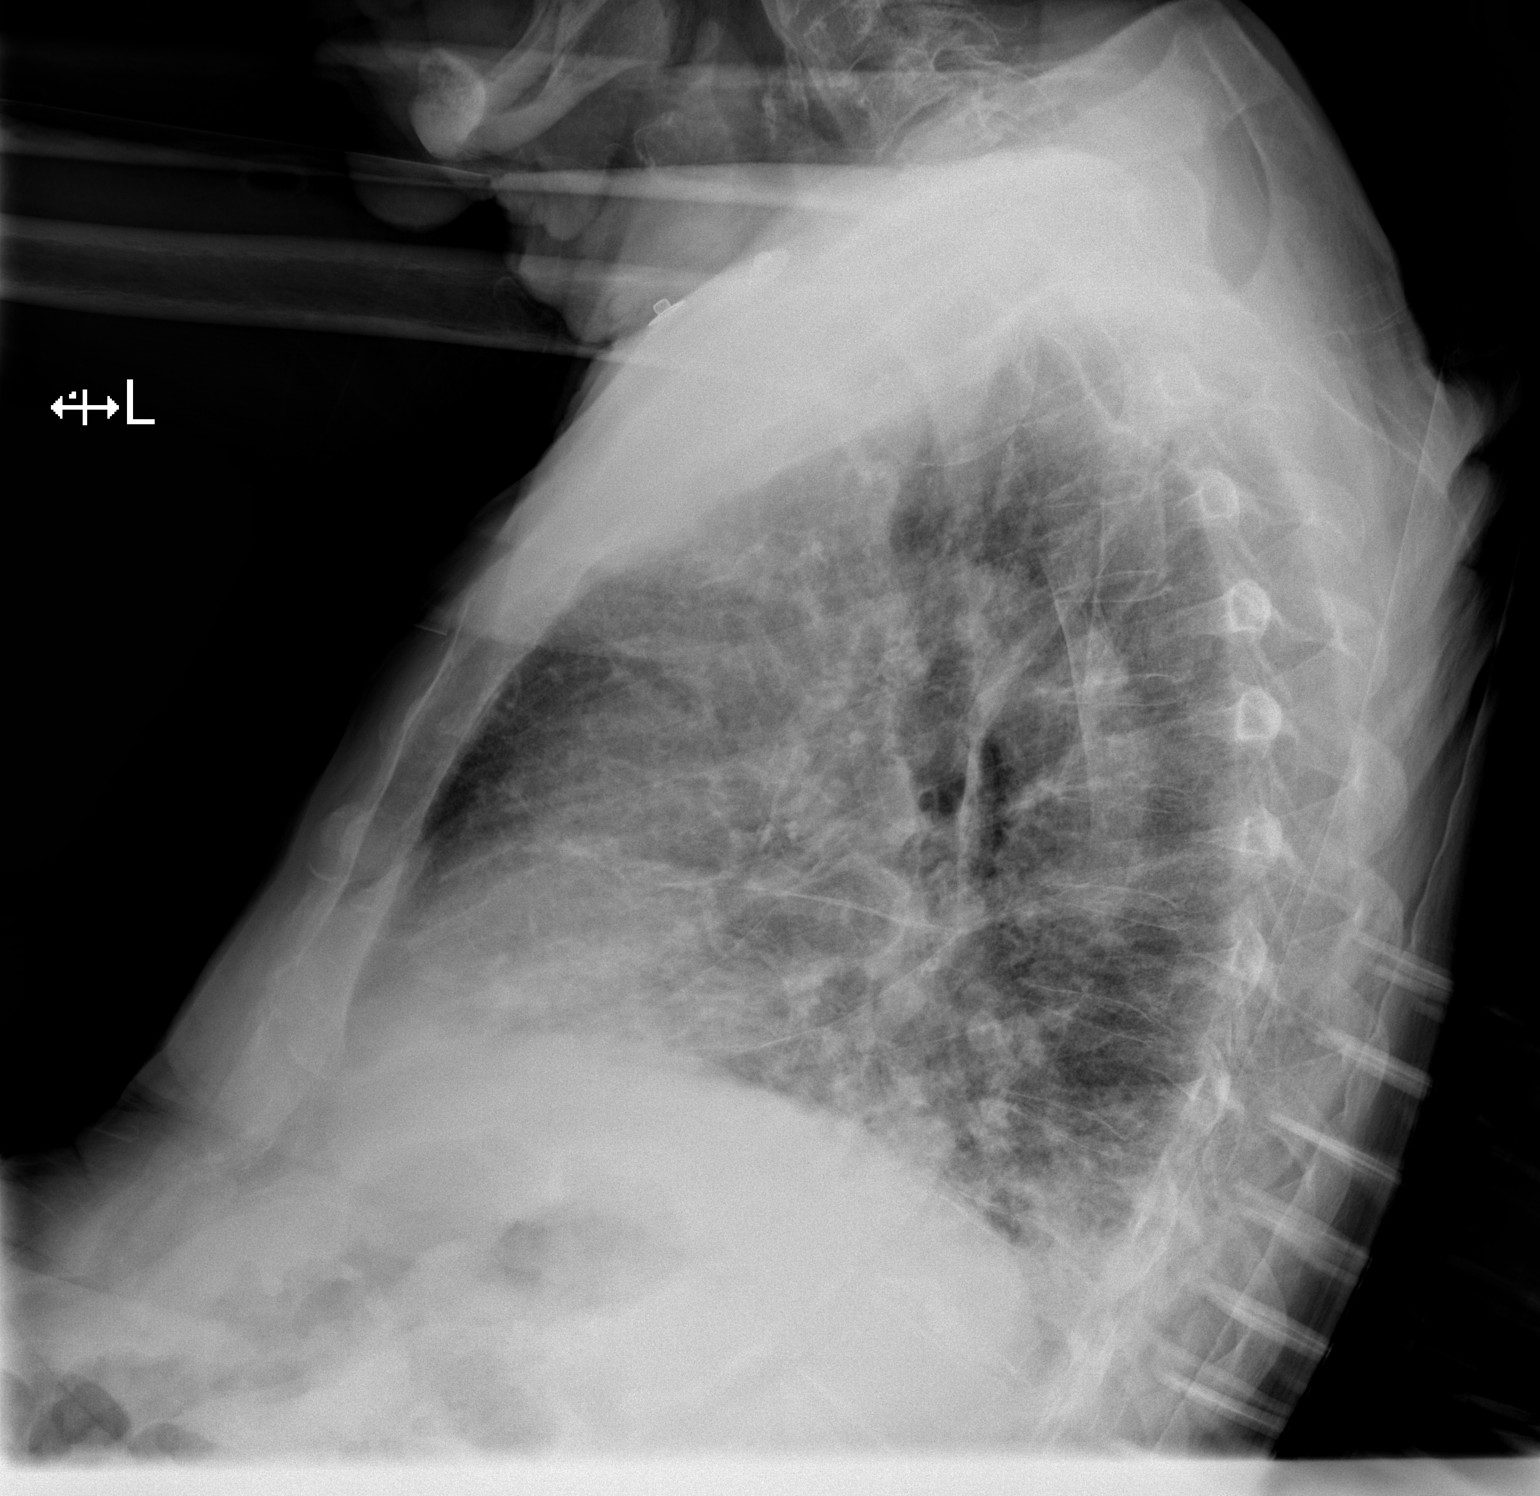

[x chest ap]
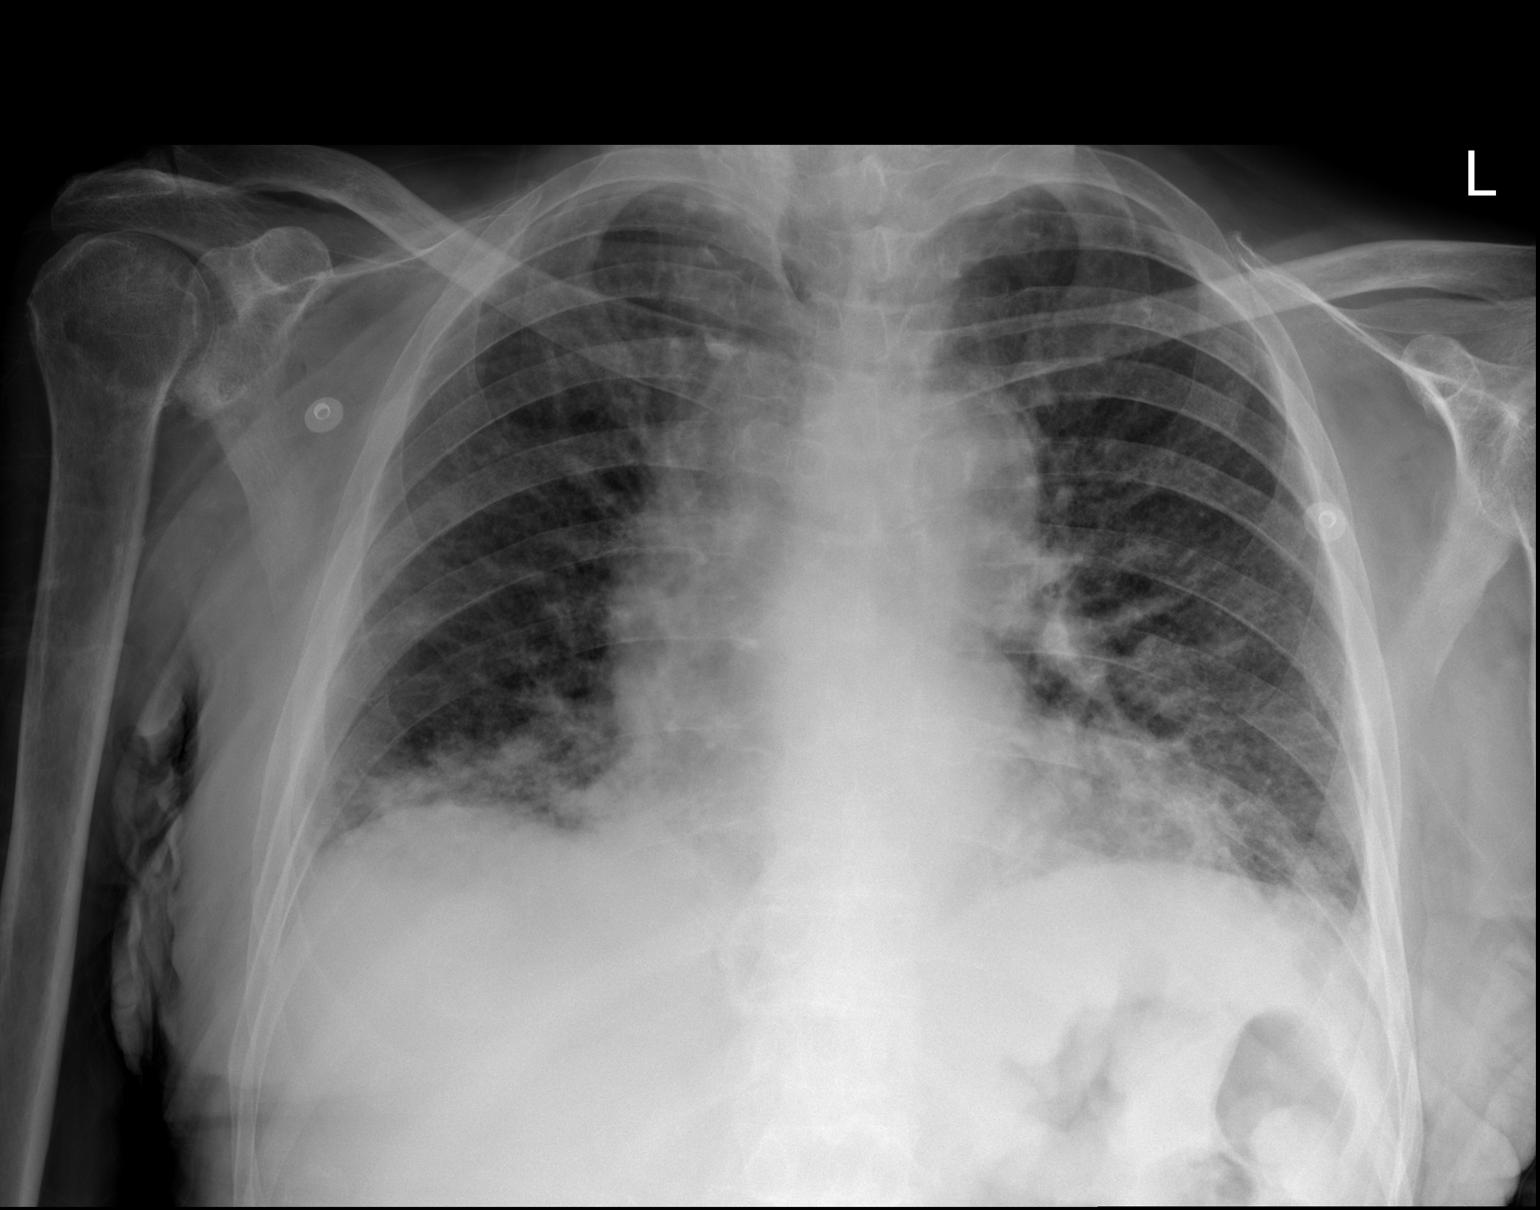

[2 of 2 positions shown; findings below may reference images not displayed]

FINDINGS: Stable cardiomegaly. Low lung volumes are seen. Increased
infiltrates are seen in both lung bases, which may be due to
pneumonia or edema. No evidence of pleural effusion.
IMPRESSION: Increased bibasilar infiltrates, which may be due to pneumonia or
edema.

Stable cardiomegaly.

## 2022-04-11 MED ORDER — MAGNESIUM OXIDE -MG SUPPLEMENT 400 (240 MG) MG PO TABS
400.0000 mg | ORAL_TABLET | Freq: Two times a day (BID) | ORAL | Status: DC
  Administered 2022-04-12 – 2022-04-14 (×4): 400 mg via ORAL
  Filled 2022-04-11 (×5): qty 1

## 2022-04-11 MED ORDER — ADULT MULTIVITAMIN W/MINERALS CH
1.0000 | ORAL_TABLET | Freq: Every day | ORAL | Status: DC
  Administered 2022-04-12 – 2022-04-14 (×3): 1 via ORAL
  Filled 2022-04-11 (×3): qty 1

## 2022-04-11 MED ORDER — SODIUM CHLORIDE 0.9 % IV SOLN
2.0000 g | Freq: Once | INTRAVENOUS | Status: AC
  Administered 2022-04-11: 2 g via INTRAVENOUS
  Filled 2022-04-11: qty 12.5

## 2022-04-11 MED ORDER — METOPROLOL TARTRATE 25 MG PO TABS
25.0000 mg | ORAL_TABLET | Freq: Two times a day (BID) | ORAL | Status: DC
  Administered 2022-04-12 – 2022-04-14 (×4): 25 mg via ORAL
  Filled 2022-04-11 (×5): qty 1

## 2022-04-11 MED ORDER — DIVALPROEX SODIUM 250 MG PO DR TAB
250.0000 mg | DELAYED_RELEASE_TABLET | Freq: Three times a day (TID) | ORAL | Status: DC
  Administered 2022-04-12 (×2): 250 mg via ORAL
  Filled 2022-04-11 (×2): qty 1

## 2022-04-11 MED ORDER — VANCOMYCIN HCL IN DEXTROSE 1-5 GM/200ML-% IV SOLN: 1000.0000 mg | Freq: Once | INTRAVENOUS | Status: DC

## 2022-04-11 MED ORDER — THIAMINE HCL 100 MG PO TABS
100.0000 mg | ORAL_TABLET | Freq: Every day | ORAL | Status: DC
  Administered 2022-04-12 – 2022-04-14 (×3): 100 mg via ORAL
  Filled 2022-04-11 (×3): qty 1

## 2022-04-11 MED ORDER — ACETAMINOPHEN 650 MG RE SUPP
650.0000 mg | Freq: Four times a day (QID) | RECTAL | Status: DC | PRN
  Administered 2022-04-14 – 2022-04-15 (×3): 650 mg via RECTAL
  Filled 2022-04-11 (×4): qty 1

## 2022-04-11 MED ORDER — ENOXAPARIN SODIUM 40 MG/0.4ML IJ SOSY
40.0000 mg | PREFILLED_SYRINGE | INTRAMUSCULAR | Status: DC
  Administered 2022-04-12 – 2022-04-15 (×4): 40 mg via SUBCUTANEOUS
  Filled 2022-04-11 (×4): qty 0.4

## 2022-04-11 MED ORDER — ACETAMINOPHEN 325 MG PO TABS: 650.0000 mg | ORAL_TABLET | Freq: Four times a day (QID) | ORAL | Status: DC | PRN

## 2022-04-11 MED ORDER — FERROUS SULFATE 325 (65 FE) MG PO TABS
324.0000 mg | ORAL_TABLET | Freq: Every day | ORAL | Status: DC
  Administered 2022-04-12 – 2022-04-14 (×3): 324 mg via ORAL
  Filled 2022-04-11 (×3): qty 1

## 2022-04-11 MED ORDER — SODIUM CHLORIDE 0.9 % IV SOLN: 2.0000 g | Freq: Once | INTRAVENOUS | Status: DC

## 2022-04-11 MED ORDER — LORATADINE 10 MG PO TABS
10.0000 mg | ORAL_TABLET | Freq: Every day | ORAL | Status: DC
  Administered 2022-04-12 – 2022-04-14 (×3): 10 mg via ORAL
  Filled 2022-04-11 (×3): qty 1

## 2022-04-11 MED ORDER — HALOPERIDOL LACTATE 5 MG/ML IJ SOLN
INTRAMUSCULAR | Status: AC
  Filled 2022-04-11: qty 1

## 2022-04-11 MED ORDER — OLANZAPINE 5 MG PO TABS
5.0000 mg | ORAL_TABLET | Freq: Three times a day (TID) | ORAL | Status: DC
  Administered 2022-04-12 – 2022-04-14 (×7): 5 mg via ORAL
  Filled 2022-04-11 (×13): qty 1

## 2022-04-11 MED ORDER — ESCITALOPRAM OXALATE 10 MG PO TABS
10.0000 mg | ORAL_TABLET | Freq: Every day | ORAL | Status: DC
  Administered 2022-04-12 – 2022-04-14 (×3): 10 mg via ORAL
  Filled 2022-04-11 (×3): qty 1

## 2022-04-11 MED ORDER — FOLIC ACID 1 MG PO TABS
1.0000 mg | ORAL_TABLET | Freq: Every day | ORAL | Status: DC
  Administered 2022-04-12 – 2022-04-14 (×3): 1 mg via ORAL
  Filled 2022-04-11 (×3): qty 1

## 2022-04-11 MED ORDER — FAMOTIDINE 20 MG PO TABS
20.0000 mg | ORAL_TABLET | Freq: Two times a day (BID) | ORAL | Status: DC
  Administered 2022-04-12 – 2022-04-14 (×4): 20 mg via ORAL
  Filled 2022-04-11 (×5): qty 1

## 2022-04-11 MED ORDER — SODIUM CHLORIDE 0.9 % IV SOLN
2.0000 g | Freq: Three times a day (TID) | INTRAVENOUS | Status: DC
  Administered 2022-04-12 – 2022-04-15 (×11): 2 g via INTRAVENOUS
  Filled 2022-04-11 (×12): qty 12.5

## 2022-04-11 MED ORDER — LACTATED RINGERS IV SOLN: INTRAVENOUS | Status: DC

## 2022-04-11 MED ORDER — LORAZEPAM 2 MG/ML IJ SOLN
0.5000 mg | Freq: Once | INTRAMUSCULAR | Status: AC
  Administered 2022-04-11: 0.5 mg via INTRAVENOUS
  Filled 2022-04-11: qty 1

## 2022-04-11 MED ORDER — VANCOMYCIN HCL 1500 MG/300ML IV SOLN
1500.0000 mg | INTRAVENOUS | Status: DC
  Filled 2022-04-11: qty 300

## 2022-04-11 MED ORDER — ONDANSETRON HCL 4 MG/2ML IJ SOLN: 4.0000 mg | Freq: Four times a day (QID) | INTRAMUSCULAR | Status: DC | PRN

## 2022-04-11 MED ORDER — LACTATED RINGERS IV BOLUS (SEPSIS)
1500.0000 mL | Freq: Once | INTRAVENOUS | Status: AC
  Administered 2022-04-11: 1500 mL via INTRAVENOUS

## 2022-04-11 MED ORDER — TAMSULOSIN HCL 0.4 MG PO CAPS
0.4000 mg | ORAL_CAPSULE | Freq: Every day | ORAL | Status: DC
  Administered 2022-04-14: 0.4 mg via ORAL
  Filled 2022-04-11: qty 1

## 2022-04-11 MED ORDER — ACETAMINOPHEN 325 MG PO TABS
650.0000 mg | ORAL_TABLET | Freq: Four times a day (QID) | ORAL | Status: DC | PRN
  Administered 2022-04-12: 650 mg via ORAL
  Filled 2022-04-11: qty 2

## 2022-04-11 MED ORDER — LORAZEPAM 2 MG/ML IJ SOLN
1.0000 mg | INTRAMUSCULAR | Status: DC | PRN
  Administered 2022-04-11 – 2022-04-15 (×12): 1 mg via INTRAVENOUS
  Filled 2022-04-11 (×12): qty 1

## 2022-04-11 MED ORDER — LORAZEPAM 0.5 MG PO TABS: 0.5000 mg | ORAL_TABLET | ORAL | Status: DC | PRN

## 2022-04-11 MED ORDER — VANCOMYCIN HCL 1500 MG/300ML IV SOLN
1500.0000 mg | Freq: Once | INTRAVENOUS | Status: AC
  Administered 2022-04-11: 1500 mg via INTRAVENOUS
  Filled 2022-04-11: qty 300

## 2022-04-11 MED ORDER — METRONIDAZOLE 500 MG/100ML IV SOLN
500.0000 mg | Freq: Once | INTRAVENOUS | Status: AC
  Administered 2022-04-11: 500 mg via INTRAVENOUS
  Filled 2022-04-11: qty 100

## 2022-04-11 MED ORDER — ONDANSETRON HCL 4 MG PO TABS: 4.0000 mg | ORAL_TABLET | Freq: Four times a day (QID) | ORAL | Status: DC | PRN

## 2022-04-11 MED ORDER — VANCOMYCIN HCL 1500 MG/300ML IV SOLN: 1500.0000 mg | INTRAVENOUS | Status: DC

## 2022-04-11 MED ORDER — ACETAMINOPHEN 325 MG PO TABS
650.0000 mg | ORAL_TABLET | Freq: Once | ORAL | Status: AC
  Administered 2022-04-11: 650 mg via ORAL
  Filled 2022-04-11: qty 2

## 2022-04-11 MED ORDER — THIAMINE HCL 100 MG/ML IJ SOLN
100.0000 mg | Freq: Once | INTRAMUSCULAR | Status: AC
  Administered 2022-04-11: 100 mg via INTRAVENOUS
  Filled 2022-04-11: qty 2

## 2022-04-11 MED ORDER — HALOPERIDOL LACTATE 5 MG/ML IJ SOLN
5.0000 mg | Freq: Once | INTRAMUSCULAR | Status: AC
Start: 2022-04-11 — End: 2022-04-11
  Administered 2022-04-11: 5 mg via INTRAVENOUS

## 2022-04-11 MED ORDER — LACTATED RINGERS IV SOLN
INTRAVENOUS | Status: AC
Start: 2022-04-11 — End: 2022-04-12

## 2022-04-11 MED ORDER — SODIUM CHLORIDE 0.9 % IV BOLUS
1000.0000 mL | Freq: Once | INTRAVENOUS | Status: AC
Start: 2022-04-11 — End: 2022-04-11
  Administered 2022-04-11: 1000 mL via INTRAVENOUS

## 2022-04-11 MED ORDER — TRAZODONE HCL 100 MG PO TABS
100.0000 mg | ORAL_TABLET | Freq: Every day | ORAL | Status: DC
Start: 2022-04-11 — End: 2022-04-15
  Administered 2022-04-12 – 2022-04-13 (×2): 100 mg via ORAL
  Filled 2022-04-11 (×2): qty 1

## 2022-04-11 NOTE — ED Provider Notes (Signed)
?[facility] DEPT ?Provider Note ? ? ?CSN: [id number] ?Arrival date & time: 04/02/2022  1519 ? ?  ? ?History ? ?Chief Complaint  ?Patient presents with  ? Altered Mental Status  ? ? ?Devin Garza is a 81 y.o. male who presents emergency department with altered mental status.  Patient has a history of alcohol abuse, dementia and is a resident at [facility].  Patient apparently has had increased confusion over the past few days prompting his visit.  Patient noted to be febrile on arrival.  Also agitated and angry. ? ? ?Altered Mental Status ? ?  ? ?Home Medications ?Prior to Admission medications   ?Medication Sig Start Date End Date Taking? Authorizing Provider  ?amLODipine (NORVASC) 10 MG tablet Take 1 tablet (10 mg total) by mouth daily. 07/21/21   Isaac Bliss, Rayford Halsted, MD  ?atorvastatin (LIPITOR) 10 MG tablet Take 1 tablet (10 mg total) by mouth daily. 07/21/21   Isaac Bliss, Rayford Halsted, MD  ?bethanechol (URECHOLINE) 10 MG tablet Take 1 tablet (10 mg total) by mouth 3 (three) times daily. 01/29/22   Jose Persia, MD  ?colchicine 0.6 MG tablet TAKE 1 TABLET BY MOUTH 2 TIMES DAILY. ?Patient taking differently: Take 0.6 mg by mouth 2 (two) times daily. 12/10/20   Isaac Bliss, Rayford Halsted, MD  ?Diclofenac Sodium 1.5 % SOLN Apply 1 mL topically 4 (four) times daily as needed. ?Patient taking differently: Apply 1 mL topically 4 (four) times daily as needed (pain). 02/04/21   Mcarthur Rossetti, MD  ?esomeprazole (NEXIUM 24HR) 20 MG capsule Take 2 capsules (40 mg total) by mouth 2 (two) times daily before a meal. 10/24/20   Medina-Vargas, Monina C, NP  ?folic acid (FOLVITE) 1 MG tablet Take 1 tablet (1 mg total) by mouth daily. 01/30/22   Jose Persia, MD  ?ipratropium-albuterol (DUONEB) 0.5-2.5 (3) MG/3ML SOLN Take 3 mLs by nebulization every 6 (six) hours as needed. 01/29/22   Jose Persia, MD  ?loratadine (CLARITIN) 10 MG tablet Take 1 tablet (10 mg total) by  mouth daily as needed for allergies. 10/24/20   Medina-Vargas, Monina C, NP  ?magnesium oxide (MAG-OX) 400 MG tablet Take 1 tablet by mouth morning, noon and at bedtime 07/21/21   Isaac Bliss, Rayford Halsted, MD  ?metoprolol tartrate (LOPRESSOR) 25 MG tablet Take 1 tablet (25 mg total) by mouth 2 (two) times daily. 01/29/22   Jose Persia, MD  ?Multiple Vitamin (MULTIVITAMIN) tablet Take 1 tablet by mouth daily.    [provider]  ?tamsulosin (FLOMAX) 0.4 MG CAPS capsule Take 1 capsule (0.4 mg total) by mouth daily after supper. 01/29/22   Jose Persia, MD  ?thiamine 100 MG tablet Take 1 tablet (100 mg total) by mouth daily. 01/30/22   Jose Persia, MD  ?   ? ?Allergies    ?Patient has no known allergies.   ? ?Review of Systems   ?Review of Systems ? ?Physical Exam ?Updated Vital Signs ?BP (!) 116/58   Pulse 97   Temp (!) 100.6 ?F (38.1 ?C) (Oral)   Resp 18   SpO2 96%  ?Physical Exam ?Vitals and nursing note reviewed.  ?Constitutional:   ?   General: He is not in acute distress. ?   Appearance: He is well-developed. He is not diaphoretic.  ?HENT:  ?   Head: Normocephalic and atraumatic.  ?Eyes:  ?   General: No scleral icterus. ?   Conjunctiva/sclera: Conjunctivae normal.  ?Cardiovascular:  ?   Rate and Rhythm:  Normal rate and regular rhythm.  ?   Heart sounds: Normal heart sounds.  ?Pulmonary:  ?   Effort: Pulmonary effort is normal. No respiratory distress.  ?   Breath sounds: Normal breath sounds.  ?Abdominal:  ?   Palpations: Abdomen is soft.  ?   Tenderness: There is no abdominal tenderness.  ?Musculoskeletal:  ?   Cervical back: Normal range of motion and neck supple.  ?Skin: ?   General: Skin is warm and dry.  ?Neurological:  ?   Mental Status: He is alert.  ?Psychiatric:     ?   Behavior: Behavior normal.  ? ? ?ED Results / Procedures / Treatments   ?Labs ?(all labs ordered are listed, but only abnormal results are displayed) ?Labs Reviewed  ?CBC WITH DIFFERENTIAL/PLATELET - Abnormal; Notable  for the following components:  ?    Result Value  ? WBC 12.4 (*)   ? RBC 3.03 (*)   ? Hemoglobin 7.8 (*)   ? HCT 25.3 (*)   ? MCH 25.7 (*)   ? RDW 17.0 (*)   ? Neutro Abs 10.1 (*)   ? Monocytes Absolute 1.1 (*)   ? All other components within normal limits  ?I-STAT CHEM 8, ED - Abnormal; Notable for the following components:  ? Glucose, Bld 111 (*)   ? Hemoglobin 8.5 (*)   ? HCT 25.0 (*)   ? All other components within normal limits  ?CULTURE, BLOOD (ROUTINE X 2)  ?CULTURE, BLOOD (ROUTINE X 2)  ?COMPREHENSIVE METABOLIC PANEL  ?URINALYSIS, ROUTINE W REFLEX MICROSCOPIC  ?AMMONIA  ?LACTIC ACID, PLASMA  ?BLOOD GAS, VENOUS  ?ETHANOL  ?CBG MONITORING, ED  ? ? ?EKG ?None ? ?Radiology ?DG Chest 2 View ? ?Result Date: 04/16/2022 ?CLINICAL DATA:  Cough.  Altered mental status for several days. EXAM: CHEST - 2 VIEW COMPARISON:  03/20/2022 FINDINGS: Stable cardiomegaly. Low lung volumes are seen. Increased infiltrates are seen in both lung bases, which may be due to pneumonia or edema. No evidence of pleural effusion. IMPRESSION: Increased bibasilar infiltrates, which may be due to pneumonia or edema. Stable cardiomegaly. Electronically Signed   By: Marlaine Hind M.D.   On: 03/27/2022 16:09  ? ?CT HEAD WO CONTRAST ? ?Result Date: 04/10/2022 ?CLINICAL DATA:  Mental status change. Increased confusion over 3 days. EXAM: CT HEAD WITHOUT CONTRAST TECHNIQUE: Contiguous axial images were obtained from the base of the skull through the vertex without intravenous contrast. RADIATION DOSE REDUCTION: This exam was performed according to the departmental dose-optimization program which includes automated exposure control, adjustment of the mA and/or kV according to patient size and/or use of iterative reconstruction technique. COMPARISON:  03/16/2022 FINDINGS: Brain: No evidence of acute infarction, hemorrhage, hydrocephalus, extra-axial collection or mass lesion/mass effect. There is mild diffuse low-attenuation within the subcortical and  periventricular white matter compatible with chronic microvascular disease. Prominence of sulci and ventricles compatible with age related brain atrophy. Vascular: No hyperdense vessel or unexpected calcification. Skull: Normal. Negative for fracture or focal lesion. Sinuses/Orbits: No acute finding. Other: None. IMPRESSION: 1. No acute intracranial abnormalities. 2. Chronic small vessel ischemic disease and brain atrophy. Electronically Signed   By: Kerby Moors M.D.   On: 04/20/2022 16:18   ? ?Procedures ?Marland KitchenCritical Care ?Performed by: Margarita Mail, PA-C ?Authorized by: Margarita Mail, PA-C  ? ?Critical care provider statement:  ?  Critical care time (minutes):  50 ?  Critical care time was exclusive of:  Separately billable procedures and treating other patients ?  Critical care was necessary to treat or prevent imminent or life-threatening deterioration of the following conditions:  Sepsis ?  Critical care was time spent personally by me on the following activities:  Development of treatment plan with patient or surrogate, discussions with consultants, evaluation of patient's response to treatment, examination of patient, ordering and review of laboratory studies, ordering and review of radiographic studies, ordering and performing treatments and interventions, pulse oximetry, re-evaluation of patient's condition and review of old charts  ? ? ?Medications Ordered in ED ?Medications  ?LORazepam (ATIVAN) injection 0.5 mg (0.5 mg Intravenous Given 04/12/2022 1558)  ?thiamine (B-1) injection 100 mg (100 mg Intravenous Given 04/15/2022 1635)  ? ? ?ED Course/ Medical Decision Making/ A&P ?  ?                        ?Medical Decision Making ?Patient here with acute AMS. The differential diagnosis for AMS is extensive and includes, but is not limited to: drug overdose - opioids, alcohol, sedatives, antipsychotics, drug withdrawal, others; Metabolic: hypoxia, hypoglycemia, hyperglycemia, hypercalcemia, hypernatremia,  hyponatremia, uremia, hepatic encephalopathy, hypothyroidism, hyperthyroidism, vitamin B12 or thiamine deficiency, carbon monoxide poisoning, Wilson's disease, Lactic acidosis, DKA/HHOS; Infectious: meningitis,

## 2022-04-11 NOTE — Progress Notes (Signed)
A consult was received from an ED physician for vancomycin and cefepime per pharmacy dosing.  The patient's profile has been reviewed for ht/wt/allergies/indication/available labs.   ?A one time order has been placed for vancomycin '1500mg'$  IV x1 and cefepime 2g IV x1.   ? ? ?Further antibiotics/pharmacy consults should be ordered by admitting physician if indicated.       ?                ?Thank you, ? ? ?Dimple Nanas, PharmD ?03/27/2022 5:40 PM ? ?

## 2022-04-11 NOTE — Progress Notes (Signed)
Pharmacy Antibiotic Note ? ?Devin Garza is a 81 y.o. male admitted on 04/14/2022 with altered mental status and cough, found to have increased bibasilar infiltrates on CXR - possibly due to pneumonia vs edema. Pharmacy has been consulted for vancomycin and cefepime dosing. ? ?WBC 12, Tm 100.6, LA 2.3 ? ?Plan: ?Vancomycin '1500mg'$  IV x1, followed by ?Vancomycin '1500mg'$  IV q24 hours (eAUC 445, Scr 0.8, Vd 0.7) ?Cefepime 2g IV q8 hours ?F/u MRSA PCR ? ?  ? ?Temp (24hrs), Avg:100.6 ?F (38.1 ?C), Min:100.6 ?F (38.1 ?C), Max:100.6 ?F (38.1 ?C) ? ?Recent Labs  ?Lab 04/19/2022 ?1528 04/15/2022 ?1545 03/31/2022 ?1636  ?WBC 12.4*  --   --   ?CREATININE 0.81  --  0.80  ?LATICACIDVEN  --  2.3*  --   ?  ?Estimated Creatinine Clearance: 70.1 mL/min (by C-G formula based on SCr of 0.8 mg/dL).   ? ?No Known Allergies ? ?Antimicrobials this admission: ?Vancomycin 4/16 >>  ?Cefepime 4/16 >>  ? ?Dose adjustments this admission: ? ? ?Microbiology results: ?4/16 BCx:  ?4/16 MRSA PCR:  ? ?Thank you for allowing pharmacy to be a part of this patient?s care. ? ?Devin Garza, PharmD ?04/16/2022 7:12 PM ? ?

## 2022-04-11 NOTE — H&P (Signed)
?History and Physical  ? ? ?PatientMarland Kitchen Devin Garza PZW:[id number] DOB: 09-29-1941 ?DOA: 04/15/2022 ?DOS: the patient was seen and examined on 04/08/2022 ?PCP: Isaac Bliss, Rayford Halsted, MD  ?Patient coming from: SNF ? ?Chief Complaint:  ?Chief Complaint  ?Patient presents with  ? Altered Mental Status  ? ?HPI: Devin Garza is a 81 y.o. male with medical history significant of alcoholism, essential hypertension, GERD, hyperlipidemia, peripheral vascular disease, basal cell carcinoma, anemia of chronic disease who presents from facility with altered mental status.  Patient has baseline dementia.  He lives at the [facility].  His confusion has been gradual over the last few days.  He is unable to give adequate history.  Patient was agitated on arrival very angry but is calming down now.  His work-up in the ER is consistent with sepsis syndrome.  Further work-up shows sepsis most likely due to pneumonia.  He has Temperature of 100.6.  With a white count of 12.4 hemoglobin 8.5.  His blood pressure is stable at 106/82.  Chest x-ray showed bilateral lower lobe infiltrates consistent with pneumonia.  Patient therefore being admitted with sepsis syndrome secondary to pneumonia. ? ?Review of Systems: As mentioned in the history of present illness. All other systems reviewed and are negative. ?Past Medical History:  ?Diagnosis Date  ? Actinic keratosis   ? Anemia   ? Basal cell adenocarcinoma 09/03/2008  ? Qualifier: Diagnosis of  By: Copland MD, Frederico Hamman    Overview:  Overview:  Qualifier: Diagnosis of  By: Lorelei Pont MD, Frederico Hamman   ? Basal cell carcinoma 11/06/2008  ? Right mastoid/neck.   ? Basal cell carcinoma 11/13/2008  ? Left lateral nose medial infraorbital ~2cm lat. to med. canthus. Excised 01/08/2009, margins free.  ? Basal cell carcinoma 10/09/2009  ? Right upper back, paraspinal inf. to base of neck. Excised 12/03/2009  ? Basal cell carcinoma 06/11/2013  ? Left lateral forehead. Excised 07/18/2013, margins  free.  ? Basal cell carcinoma 07/30/2015  ? Right nasal ala. Nodular pattern.  ? Basal cell carcinoma 07/30/2015  ? Right mid to sup. helix. Nodular.  ? Basal cell carcinoma 08/25/2016  ? Right mid to sup. helix. Superficial with focal infiltration. Excised 09/28/2016, margins free.  ? Basal cell carcinoma 03/16/2017  ? Right mid to sup. helix. Mixed pattern, ulcerated  ? Basal cell carcinoma 05/10/2018  ? Right nasal tip. BCC with focal sclerosis.  ? Basal cell carcinoma of lip 2007  ? multiple sites  ? Blood transfusion without reported diagnosis   ? ED (erectile dysfunction)   ? GERD (gastroesophageal reflux disease)   ? HLD (hyperlipidemia)   ? Hx of squamous cell carcinoma of skin 08/25/2016  ? L superior pectoral  ? Hypertension   ? Osteoarthritis   ? Peripheral vascular disease ([place])   ? Occlusion and Stenosis of Carotid Artery  ? Squamous cell carcinoma of skin 08/25/2016  ? Left superior pectoral. KA pattern. EDC.  ? Tobacco abuse   ? Tubular adenoma of colon 2005  ? Wears dentures   ? full upper and lower  ? Wernicke's disease   ? ?Past Surgical History:  ?Procedure Laterality Date  ? ANKLE ARTHROSCOPY W/ OPEN REPAIR Left   ? COLONOSCOPY  2006  ? [place]-Tubular adenoma  ? COLONOSCOPY N/A 07/23/2015  ? Procedure: COLONOSCOPY;  Surgeon: Lucilla Lame, MD;  Location: [facility];  Service: Gastroenterology;  Laterality: N/A;  WITH BIOPSIES---- ?TRANSVERSE COLON POLYP ?DESCENDING COLON POLYP  X  4  ? ESOPHAGOGASTRODUODENOSCOPY N/A  07/23/2015  ? Procedure: ESOPHAGOGASTRODUODENOSCOPY (EGD);  Surgeon: Lucilla Lame, MD;  Location: [facility];  Service: Gastroenterology;  Laterality: N/A;  WITH BIOPSY--- ?DUODENAL BIOPSY ?GASTRIC BIOPSY  ? ESOPHAGOGASTRODUODENOSCOPY N/A 10/28/2016  ? Procedure: ESOPHAGOGASTRODUODENOSCOPY (EGD);  Surgeon: Manya Silvas, MD;  Location[facility] ENDOSCOPY;  Service: Endoscopy;  Laterality: N/A;  ? ESOPHAGOGASTRODUODENOSCOPY (EGD) WITH PROPOFOL N/A 09/02/2017  ? Procedure:  ESOPHAGOGASTRODUODENOSCOPY (EGD) WITH PROPOFOL;  Surgeon: Manya Silvas, MD;  Location: [facility] ENDOSCOPY;  Service: Endoscopy;  Laterality: N/A;  ? SKIN CANCER EXCISION    ? LIP  ? TONSILLECTOMY    ? age 33  ? UPPER GI ENDOSCOPY  12/2009  ? Dr. Ivory Broad gastritisl negative for H. pylori; duodenal biopsies neg for sprue  ? ?Social History:  reports that he quit smoking about 2 years ago. His smoking use included cigarettes. He has a 20.00 pack-year smoking history. He has never used smokeless tobacco. He reports current alcohol use of about 14.0 standard drinks per week. He reports that he does not use drugs. ? ?No Known Allergies ? ?Family History  ?Problem Relation Age of Onset  ? Lung cancer Mother 42  ? Hearing loss Father   ? Throat cancer Father   ? Heart attack Father   ? CAD Father   ? Ovarian cancer Sister   ? ? ?Prior to Admission medications   ?Medication Sig Start Date End Date Taking? Authorizing Provider  ?amLODipine (NORVASC) 10 MG tablet Take 1 tablet (10 mg total) by mouth daily. 07/21/21   Isaac Bliss, Rayford Halsted, MD  ?atorvastatin (LIPITOR) 10 MG tablet Take 1 tablet (10 mg total) by mouth daily. 07/21/21   Isaac Bliss, Rayford Halsted, MD  ?bethanechol (URECHOLINE) 10 MG tablet Take 1 tablet (10 mg total) by mouth 3 (three) times daily. 01/29/22   Jose Persia, MD  ?colchicine 0.6 MG tablet TAKE 1 TABLET BY MOUTH 2 TIMES DAILY. ?Patient taking differently: Take 0.6 mg by mouth 2 (two) times daily. 12/10/20   Isaac Bliss, Rayford Halsted, MD  ?Diclofenac Sodium 1.5 % SOLN Apply 1 mL topically 4 (four) times daily as needed. ?Patient taking differently: Apply 1 mL topically 4 (four) times daily as needed (pain). 02/04/21   Mcarthur Rossetti, MD  ?esomeprazole (NEXIUM 24HR) 20 MG capsule Take 2 capsules (40 mg total) by mouth 2 (two) times daily before a meal. 10/24/20   Medina-Vargas, Monina C, NP  ?folic acid (FOLVITE) 1 MG tablet Take 1 tablet (1 mg total) by mouth daily. 01/30/22    Jose Persia, MD  ?ipratropium-albuterol (DUONEB) 0.5-2.5 (3) MG/3ML SOLN Take 3 mLs by nebulization every 6 (six) hours as needed. 01/29/22   Jose Persia, MD  ?loratadine (CLARITIN) 10 MG tablet Take 1 tablet (10 mg total) by mouth daily as needed for allergies. 10/24/20   Medina-Vargas, Monina C, NP  ?magnesium oxide (MAG-OX) 400 MG tablet Take 1 tablet by mouth morning, noon and at bedtime 07/21/21   Isaac Bliss, Rayford Halsted, MD  ?metoprolol tartrate (LOPRESSOR) 25 MG tablet Take 1 tablet (25 mg total) by mouth 2 (two) times daily. 01/29/22   Jose Persia, MD  ?Multiple Vitamin (MULTIVITAMIN) tablet Take 1 tablet by mouth daily.    [provider]  ?tamsulosin (FLOMAX) 0.4 MG CAPS capsule Take 1 capsule (0.4 mg total) by mouth daily after supper. 01/29/22   Jose Persia, MD  ?thiamine 100 MG tablet Take 1 tablet (100 mg total) by mouth daily. 01/30/22   Jose Persia, MD  ? ? ?  Physical Exam: ?Vitals:  ? 04/04/2022 1730 04/19/2022 1800 03/29/2022 2030 03/29/2022 2256  ?BP: (!) 108/57 106/82 106/83 110/70  ?Pulse: 83 77 68 70  ?Resp: (!) 22 (!) '21 18 17  '$ ?Temp:    98.4 ?F (36.9 ?C)  ?TempSrc:    Oral  ?SpO2: 96% 100% 100% 100%  ? ?General: Acutely ill looking, agitated and delirious with mild distress ?HEENT: PERRL, EOMI no pallor no jaundice ?Neck: Supple no JVD no lymphadenopathy ?Respiratory: Good air entry bilaterally no wheeze rales or crackles ?Cardiovascular: Sinus tachycardia ?Abdomen: Soft nontender no organomegaly positive bowel sounds ?Extremities: No edema cyanosis or clubbing ?Neuro exam: Nonfocal exam.  Moves all extremities.  Power is 5 out of 5 upper and lower extremities respectively ?Psych: Patient is delirious, agitated, combative ? ?Data Reviewed: ? ?ABG showed pH of 7.44 PO2 of 53.  The rest of the chemistry appeared to be within normal. Lactic acid 2.3.  White count is 12.4 hemoglobin 7.8 and platelets of 303.  PT 16.7 INR 1.4.  Viral screen is negative.  Urinalysis also negative  chest x-ray showed bilateral lower lobe infiltrates consistent with pneumonia.  Head CT without contrast is negative.. ? ?Assessment and Plan: ? ?#1 sepsis due to pneumonia: Patient will be admitted and be treated for h

## 2022-04-11 NOTE — ED Triage Notes (Signed)
Pt BIBA from [facility]. EMS called for mental status decline (increased confusion) over 3x days. ? ?Hx dementia. ?EMS concern for UTI. ? ?Ax oriented to self. ? ?Given 500 NS en route ? ? ?

## 2022-04-11 NOTE — Progress Notes (Signed)
[facility]link following for sepsis protocol. 

## 2022-04-12 ENCOUNTER — Other Ambulatory Visit: Payer: Self-pay

## 2022-04-12 DIAGNOSIS — D509 Iron deficiency anemia, unspecified: Secondary | ICD-10-CM

## 2022-04-12 DIAGNOSIS — I10 Essential (primary) hypertension: Secondary | ICD-10-CM

## 2022-04-12 DIAGNOSIS — J189 Pneumonia, unspecified organism: Secondary | ICD-10-CM | POA: Diagnosis not present

## 2022-04-12 DIAGNOSIS — G9341 Metabolic encephalopathy: Secondary | ICD-10-CM | POA: Diagnosis not present

## 2022-04-12 LAB — CBC
HCT: 23.4 % — ABNORMAL LOW (ref 39.0–52.0)
Hemoglobin: 7.4 g/dL — ABNORMAL LOW (ref 13.0–17.0)
MCH: 25.9 pg — ABNORMAL LOW (ref 26.0–34.0)
MCHC: 31.6 g/dL (ref 30.0–36.0)
MCV: 81.8 fL (ref 80.0–100.0)
Platelets: 255 10*3/uL (ref 150–400)
RBC: 2.86 MIL/uL — ABNORMAL LOW (ref 4.22–5.81)
RDW: 16.8 % — ABNORMAL HIGH (ref 11.5–15.5)
WBC: 9 10*3/uL (ref 4.0–10.5)
nRBC: 0 % (ref 0.0–0.2)

## 2022-04-12 LAB — COMPREHENSIVE METABOLIC PANEL WITH GFR
ALT: 7 U/L (ref 0–44)
AST: 13 U/L — ABNORMAL LOW (ref 15–41)
Albumin: 2.4 g/dL — ABNORMAL LOW (ref 3.5–5.0)
Alkaline Phosphatase: 36 U/L — ABNORMAL LOW (ref 38–126)
Anion gap: 7 (ref 5–15)
BUN: 17 mg/dL (ref 8–23)
CO2: 21 mmol/L — ABNORMAL LOW (ref 22–32)
Calcium: 8.4 mg/dL — ABNORMAL LOW (ref 8.9–10.3)
Chloride: 108 mmol/L (ref 98–111)
Creatinine, Ser: 0.73 mg/dL (ref 0.61–1.24)
GFR, Estimated: >60 mL/min
Glucose, Bld: 94 mg/dL (ref 70–99)
Potassium: 3.7 mmol/L (ref 3.5–5.1)
Sodium: 136 mmol/L (ref 135–145)
Total Bilirubin: 0.6 mg/dL (ref 0.3–1.2)
Total Protein: 5.4 g/dL — ABNORMAL LOW (ref 6.5–8.1)

## 2022-04-12 LAB — LACTIC ACID, PLASMA: Lactic Acid, Venous: 0.7 mmol/L (ref 0.5–1.9)

## 2022-04-12 LAB — CORTISOL-AM, BLOOD: Cortisol - AM: 11 ug/dL (ref 6.7–22.6)

## 2022-04-12 LAB — PROTIME-INR
INR: 1.5 — ABNORMAL HIGH (ref 0.8–1.2)
Prothrombin Time: 17.7 seconds — ABNORMAL HIGH (ref 11.4–15.2)

## 2022-04-12 LAB — MRSA NEXT GEN BY PCR, NASAL: MRSA by PCR Next Gen: NOT DETECTED

## 2022-04-12 LAB — PROCALCITONIN: Procalcitonin: 0.37 ng/mL

## 2022-04-12 MED ORDER — VALPROATE SODIUM 100 MG/ML IV SOLN
500.0000 mg | Freq: Three times a day (TID) | INTRAVENOUS | Status: DC
  Administered 2022-04-12 – 2022-04-14 (×6): 500 mg via INTRAVENOUS
  Filled 2022-04-12 (×7): qty 5

## 2022-04-12 MED ORDER — HALOPERIDOL LACTATE 5 MG/ML IJ SOLN
5.0000 mg | Freq: Once | INTRAMUSCULAR | Status: AC
  Administered 2022-04-12: 5 mg via INTRAVENOUS
  Filled 2022-04-12: qty 1

## 2022-04-12 MED ORDER — DIVALPROEX SODIUM 250 MG PO DR TAB: 500.0000 mg | DELAYED_RELEASE_TABLET | Freq: Three times a day (TID) | ORAL | Status: DC

## 2022-04-12 MED ORDER — DIVALPROEX SODIUM 250 MG PO DR TAB: 250.0000 mg | DELAYED_RELEASE_TABLET | ORAL | Status: DC

## 2022-04-12 MED ORDER — DIPHENHYDRAMINE HCL 50 MG/ML IJ SOLN
25.0000 mg | Freq: Once | INTRAMUSCULAR | Status: AC
  Administered 2022-04-12: 25 mg via INTRAVENOUS
  Filled 2022-04-12: qty 1

## 2022-04-12 NOTE — Progress Notes (Signed)
Patient very agitated, attempting to get out of bed multiple times, pulling at lines and not redirectable. Multiple attempts at redirection this shift.  MD notified.  Order for restraints placed.  Patient placed back in bed and bilateral wrist restraints applied. ? ?Virginia Rochester, RN ? ?

## 2022-04-12 NOTE — TOC Initial Note (Signed)
Transition of Care (TOC) - Initial/Assessment Note  ? ? ?Patient Details  ?Name: Devin Garza ?MRN: [id number] ?Date of Birth: 24-Apr-1941 ? ?Transition of Care (TOC) CM/SW Contact:    ?Tawanna Cooler, RN ?Phone Number: ?04/12/2022, 1:56 PM ? ?Clinical Narrative:                 ? ?Patient is from [facility] at [facility].  He has dementia, confused and sometimes aggressive at baseline.  Hx of alcohol abuse.  Oftentimes refuses to work with PT at the facility, often asking for alcohol.  Is on service with [facility] for palliative services.   ?TOC following.  ? ? ?Expected Discharge Plan: Assisted Living ?Barriers to Discharge: Continued Medical Work up ? ? ?Expected Discharge Plan and Services ?Expected Discharge Plan: Assisted Living ?  ?   ?Living arrangements for the past 2 months: [place] ?                ?  ?Prior Living Arrangements/Services ?Living arrangements for the past 2 months: [place] ?Lives with:: Facility Resident ?Patient language and need for interpreter reviewed:: Yes ?       ?Need for Family Participation in Patient Care: Yes (Comment) ?Care giver support system in place?: Yes (comment) ?  ?Criminal Activity/Legal Involvement Pertinent to Current Situation/Hospitalization: No - Comment as needed ? ?Activities of Daily Living ?Home Assistive Devices/Equipment: Bedside commode/3-in-1, Eyeglasses, Walker (specify type) ?ADL Screening (condition at time of admission) ?Patient's cognitive ability adequate to safely complete daily activities?: No ?Is the patient deaf or have difficulty hearing?: No ?Does the patient have difficulty seeing, even when wearing glasses/contacts?: Yes ?Does the patient have difficulty concentrating, remembering, or making decisions?: No ?Patient able to express need for assistance with ADLs?: No ?Does the patient have difficulty dressing or bathing?: Yes ?Independently performs ADLs?: No ?Communication: Needs assistance ?Is this a change  from baseline?: Pre-admission baseline ?Dressing (OT): Needs assistance ?Is this a change from baseline?: Pre-admission baseline ?Grooming: Needs assistance ?Is this a change from baseline?: Pre-admission baseline ?Feeding: Dependent ?Is this a change from baseline?: Pre-admission baseline ?Bathing: Dependent ?Is this a change from baseline?: Pre-admission baseline ?Toileting: Needs assistance ?Is this a change from baseline?: Pre-admission baseline ?In/Out Bed: Needs assistance ?Is this a change from baseline?: Pre-admission baseline ?Walks in Home: Needs assistance ?Is this a change from baseline?: Pre-admission baseline ?Does the patient have difficulty walking or climbing stairs?: Yes ?Weakness of Legs: Both ?Weakness of Arms/Hands: Both ? ?Emotional Assessment ? Admission diagnosis:  Sepsis ([place]) [A41.9] ?Patient Active Problem List  ? Diagnosis Date Noted  ? Dysphagia 04/16/2022  ? Sepsis ([place]) 04/12/2022  ? Acute metabolic encephalopathy 56/38/7564  ? Healthcare-associated pneumonia 04/15/2022  ? Acute respiratory failure with hypoxia ([place])   ? Delirium   ? Malnutrition of moderate degree 01/25/2022  ? Hypocalcemia 01/22/2022  ? AKI (acute kidney injury) ([place]) 01/22/2022  ? Fall 10/16/2020  ? Pressure injury of skin 10/16/2020  ? Seizure ([place]) 10/15/2020  ? Hypokalemia 10/15/2020  ? Hypomagnesemia 10/15/2020  ? Hyperglycemia 10/15/2020  ? Right hip pain 10/15/2020  ? Acute urinary retention 10/15/2020  ? History of alcohol abuse 10/15/2020  ? Gout 03/21/2017  ? History of upper gastrointestinal bleeding 11/10/2016  ? Angiodysplasia of stomach and duodenum without hemorrhage   ? Benign neoplasm of transverse colon   ? Benign neoplasm of descending colon   ? GERD without esophagitis 05/28/2015  ? Tubular adenoma of colon 05/28/2015  ? Prediabetes 04/21/2011  ?  Alcohol abuse 01/21/2011  ? Anemia, unspecified 11/12/2009  ? Basal cell carcinoma of skin 09/03/2008  ? Unspecified malignant neoplasm of other  specified sites of skin 09/03/2008  ? Carotid artery stenosis 08/28/2008  ? Insomnia 08/28/2008  ? Occlusion and stenosis of carotid artery 08/28/2008  ? Mixed hyperlipidemia 08/27/2008  ? Essential hypertension 08/27/2008  ? ?PCP:  Isaac Bliss, Rayford Halsted, MD ?Pharmacy:   ?[facility], [place] ?[address] ?[place] [address] ?Phone: [phone] Fax: [phone] ? ?[facility] [place] [facility], [place] ?[address] ?[place] [address] ?Phone: [phone] Fax: [phone] ? ?[id number] [place] ?[id number]?Phone: [phone]: [phone]? ?[place]?Phone: [phone]: [phone]? ?[place]?Phone: [phone]: [phone]? ? ? ?Readmission Risk Interventions ? ?  04/12/2022  ?  1:54 PM 10/21/2020  ?  5:29 PM  ?Readmission Risk Prevention Plan  ?Transportation Screening Complete Complete  ?PCP or Specialist Appt within 3-5 Days  Complete  ?[place] Home Care Consult  Complete  ?Social Work Consult for [place]/Counseling  Complete  ?Palliative Care Screening  Not Applicable  ?Medication Review (Press photographer Complete Complete  ?PCP or Specialist appointment within 3-5 days of discharge Complete   ?SW Recovery Care/Counseling Consult Complete   ?Palliative Care Screening Complete   ? ? ? ?

## 2022-04-12 NOTE — Progress Notes (Signed)
[facility] ? ?PROGRESS NOTE ? ?Devin Garza XKG:[id number] DOB: October 07, 1941 DOA: 04/13/2022 ?PCP: Devin Garza, Devin Halsted, MD ? ? ?Brief HPI:   ?81 year old male with medical history of alcoholism, essential hypertension, GERD, hyperlipidemia, peripheral vascular disease, basal cell carcinoma, anemia of chronic disease presented with altered mental status.  Patient has dementia at baseline.  Has been confused over past few days.  Chest x-ray showed increased bibasilar infiltrates, which may be due to pneumonia or edema. ?Patient started on IV antibiotics ? ? ? ?Subjective  ? ?Patient seen and examined, has been agitated this morning. ? ? Assessment/Plan:  ? ? ?Sepsis due to pneumonia ?-Patient admitted for community-acquired pneumonia ?-Started on vancomycin and cefepime ?-We will discontinue vancomycin, continue cefepime ? ?Community-acquired pneumonia ?-Started on antibiotics as above ? ?Acute metabolic encephalopathy ?-Patient has underlying dementia ?-Has behavior disturbance, no improvement with Zyprexa, Ativan and Haldol ?-We will give Benadryl 25 mg IV x1, increase Depakote to 500 mg p.o. 3 times daily ?-Continue olanzapine 5 mg p.o. 3 times daily ? ?Essential hypertension ?-Blood pressure stable ?-Continue metoprolol 25 mg p.o. twice daily ? ?Anemia ?-Hemoglobin dropped to 7.4 today, at baseline ?-Follow CBC in a.m. and transfuse for hemoglobin less than 7 ? ? ? ?Medications ? ?  ? divalproex  250 mg Oral NOW  ? divalproex  500 mg Oral TID  ? enoxaparin (LOVENOX) injection  40 mg Subcutaneous Q24H  ? escitalopram  10 mg Oral Daily  ? famotidine  20 mg Oral BID  ? ferrous sulfate  324 mg Oral Q breakfast  ? folic acid  1 mg Oral Daily  ? loratadine  10 mg Oral Daily  ? magnesium oxide  400 mg Oral BID  ? metoprolol tartrate  25 mg Oral BID  ? multivitamin with minerals  1 tablet Oral Daily  ? OLANZapine  5 mg Oral TID  ? tamsulosin  0.4 mg Oral QPC supper  ? thiamine  100 mg Oral Daily  ? traZODone   100 mg Oral QHS  ? ? ? Data Reviewed:  ? ?CBG: ? ?Recent Labs  ?Lab 04/25/2022 ?1728  ?GLUCAP 99  ? ? ?SpO2: 96 %  ? ? ?Vitals:  ? 04/12/22 0347 04/12/22 0348 04/12/22 0424 04/12/22 1326  ?BP:   (!) 99/38 (!) 128/95  ?Pulse:   68 74  ?Resp:   20 19  ?Temp:   98 ?F (36.7 ?C) 97.6 ?F (36.4 ?C)  ?TempSrc:    Oral  ?SpO2:   92% 96%  ?Weight: 73.2 kg     ?Height:  '5\' 8"'$  (1.727 m)    ? ? ? ? ?Data Reviewed: ? ?Basic Metabolic Panel: ?Recent Labs  ?Lab 03/27/2022 ?1528 04/12/2022 ?1636 04/12/22 ?0518  ?NA 136 137 136  ?K 3.6 3.6 3.7  ?CL 106 102 108  ?CO2 22  --  21*  ?GLUCOSE 108* 111* 94  ?BUN '17 14 17  '$ ?CREATININE 0.81 0.80 0.73  ?CALCIUM 8.5*  --  8.4*  ? ? ?CBC: ?Recent Labs  ?Lab 03/31/2022 ?1528 04/09/2022 ?1636 04/12/22 ?0518  ?WBC 12.4*  --  9.0  ?NEUTROABS 10.1*  --   --   ?HGB 7.8* 8.5* 7.4*  ?HCT 25.3* 25.0* 23.4*  ?MCV 83.5  --  81.8  ?PLT 303  --  255  ? ? ?LFT ?Recent Labs  ?Lab 04/14/2022 ?1528 04/12/22 ?0518  ?AST 15 13*  ?ALT 8 7  ?ALKPHOS 38 36*  ?BILITOT 0.3 0.6  ?PROT 6.0* 5.4*  ?ALBUMIN  2.9* 2.4*  ? ?  ?Antibiotics: ?Anti-infectives (From admission, onward)  ? ? Start     Dose/Rate Route Frequency Ordered Stop  ? 04/12/22 1800  vancomycin (VANCOREADY) IVPB 1500 mg/300 mL       ? 1,500 mg ?150 mL/hr over 120 Minutes Intravenous Every 24 hours 04/04/2022 2218    ? 04/12/22 0200  ceFEPIme (MAXIPIME) 2 g in sodium chloride 0.9 % 100 mL IVPB       ? 2 g ?200 mL/hr over 30 Minutes Intravenous Every 8 hours 04/25/2022 1910    ? 04/12/22 0015  vancomycin (VANCOCIN) IVPB 1000 mg/200 mL premix  Status:  Discontinued       ? 1,000 mg ?200 mL/hr over 60 Minutes Intravenous  Once 03/28/2022 2328 04/12/2022 2338  ? 04/12/22 0015  ceFEPIme (MAXIPIME) 2 g in sodium chloride 0.9 % 100 mL IVPB  Status:  Discontinued       ? 2 g ?200 mL/hr over 30 Minutes Intravenous  Once 04/18/2022 2328 04/22/2022 2338  ? 04/15/2022 1915  vancomycin (VANCOREADY) IVPB 1500 mg/300 mL  Status:  Discontinued       ? 1,500 mg ?150 mL/hr over 120 Minutes Intravenous  Every 24 hours 04/04/2022 1910 04/24/2022 2217  ? 04/18/2022 1745  ceFEPIme (MAXIPIME) 2 g in sodium chloride 0.9 % 100 mL IVPB       ? 2 g ?200 mL/hr over 30 Minutes Intravenous  Once 04/10/2022 1739 03/28/2022 1830  ? 03/30/2022 1745  metroNIDAZOLE (FLAGYL) IVPB 500 mg       ? 500 mg ?100 mL/hr over 60 Minutes Intravenous  Once 04/16/2022 1739 04/04/2022 1906  ? 04/19/2022 1745  vancomycin (VANCOCIN) IVPB 1000 mg/200 mL premix  Status:  Discontinued       ? 1,000 mg ?200 mL/hr over 60 Minutes Intravenous  Once 04/24/2022 1739 04/09/2022 1741  ? 04/05/2022 1745  vancomycin (VANCOREADY) IVPB 1500 mg/300 mL       ? 1,500 mg ?150 mL/hr over 120 Minutes Intravenous  Once 04/08/2022 1741 04/13/2022 2149  ? ?  ? ? ? ?DVT prophylaxis: Lovenox ? ?Code Status: Full code ? ?Family Communication: No family at bedside ? ? ?CONSULTS  ? ? ?Objective  ? ? ?Physical Examination: ? ? ?General-appears in no acute distress ?Heart-S1-S2, regular, no murmur auscultated ?Lungs-clear to auscultation bilaterally, no wheezing or crackles auscultated ?Abdomen-soft, nontender, no organomegaly ?Extremities-no edema in the lower extremities ?Neuro-alert, oriented to self only, confused ? ?Status is: Inpatient: Altered mental status ? ? ? ?  ? ? ? ? ? ?Devin Garza ?  ?[facility] ?If 7PM-7AM, please contact night-coverage at [email or web address], ?Office  [phone] ? ? ?04/12/2022, 3:50 PM  LOS: 1 day  ? ? ? ? ? ? ? ? ? ? ?  ?

## 2022-04-13 ENCOUNTER — Inpatient Hospital Stay: Payer: Medicare Other | Admitting: Internal Medicine

## 2022-04-13 DIAGNOSIS — I10 Essential (primary) hypertension: Secondary | ICD-10-CM | POA: Diagnosis not present

## 2022-04-13 DIAGNOSIS — F1011 Alcohol abuse, in remission: Secondary | ICD-10-CM

## 2022-04-13 DIAGNOSIS — J189 Pneumonia, unspecified organism: Secondary | ICD-10-CM | POA: Diagnosis not present

## 2022-04-13 DIAGNOSIS — G9341 Metabolic encephalopathy: Secondary | ICD-10-CM | POA: Diagnosis not present

## 2022-04-13 LAB — URINE CULTURE: Culture: NO GROWTH

## 2022-04-13 LAB — CBC
HCT: 27.6 % — ABNORMAL LOW (ref 39.0–52.0)
Hemoglobin: 8.5 g/dL — ABNORMAL LOW (ref 13.0–17.0)
MCH: 25.7 pg — ABNORMAL LOW (ref 26.0–34.0)
MCHC: 30.8 g/dL (ref 30.0–36.0)
MCV: 83.4 fL (ref 80.0–100.0)
Platelets: 285 10*3/uL (ref 150–400)
RBC: 3.31 MIL/uL — ABNORMAL LOW (ref 4.22–5.81)
RDW: 17 % — ABNORMAL HIGH (ref 11.5–15.5)
WBC: 8.4 10*3/uL (ref 4.0–10.5)
nRBC: 0 % (ref 0.0–0.2)

## 2022-04-13 MED ORDER — LOPERAMIDE HCL 2 MG PO CAPS
2.0000 mg | ORAL_CAPSULE | Freq: Once | ORAL | Status: AC
  Administered 2022-04-13: 2 mg via ORAL
  Filled 2022-04-13: qty 1

## 2022-04-13 MED ORDER — CHLORHEXIDINE GLUCONATE 0.12 % MT SOLN
15.0000 mL | Freq: Two times a day (BID) | OROMUCOSAL | Status: DC
  Administered 2022-04-13 – 2022-04-15 (×4): 15 mL via OROMUCOSAL
  Filled 2022-04-13 (×3): qty 15

## 2022-04-13 MED ORDER — ORAL CARE MOUTH RINSE
15.0000 mL | Freq: Two times a day (BID) | OROMUCOSAL | Status: DC
  Administered 2022-04-13 – 2022-04-15 (×5): 15 mL via OROMUCOSAL

## 2022-04-13 MED ORDER — IPRATROPIUM-ALBUTEROL 0.5-2.5 (3) MG/3ML IN SOLN
3.0000 mL | Freq: Four times a day (QID) | RESPIRATORY_TRACT | Status: DC | PRN
  Administered 2022-04-13 – 2022-04-15 (×2): 3 mL via RESPIRATORY_TRACT
  Filled 2022-04-13 (×3): qty 3

## 2022-04-13 MED ORDER — ZINC OXIDE 40 % EX OINT
TOPICAL_OINTMENT | Freq: Three times a day (TID) | CUTANEOUS | Status: DC
  Filled 2022-04-13: qty 57

## 2022-04-13 NOTE — Progress Notes (Signed)
Pt NT suctioned x1 down each nare for thick tan, blood-tinged secretions.  Pt hyper-oxygenated on 15l nrb prior to and during procedure.  RN assisted at bedside. ?

## 2022-04-13 NOTE — Progress Notes (Signed)
Approx 2145 pt continues to be very restless , scooting to foot of bed, moaning loudly, congested NP cough, RR noted to be 42 with O2sats 83-87% on RA. Placed pt on 3 Lnc O2 , notified on call [facility] via Amion and notified Resp Therapy who came to the bedside.  RT assessed pt and reached out to [name] NP for orders. Pt had a very brief quiet time of maybe 10 mins after getting '1mg'$  IV Ativan.  ?

## 2022-04-13 NOTE — Progress Notes (Signed)
[facility] ? ?PROGRESS NOTE ? ?Exander Shaul LOV:[id number] DOB: 1941-03-03 DOA: 04/05/2022 ?PCP: Isaac Bliss, Rayford Halsted, MD ? ? ?Brief HPI:   ? ?81 year old male with medical history of alcoholism, essential hypertension, GERD, hyperlipidemia, peripheral vascular disease, basal cell carcinoma, anemia of chronic disease presented with altered mental status.  Patient has dementia at baseline.  Has been confused over past few days.  Chest x-ray showed increased bibasilar infiltrates, which may be due to pneumonia or edema. ?Patient started on IV antibiotics ? ? ? ?Subjective  ? ?Patient is much more relaxed today.  Sleeping.  Did not wake him up. ? ? Assessment/Plan:  ? ? ?Sepsis due to pneumonia ?-Patient admitted for community-acquired pneumonia ?-Started on vancomycin and cefepime ?-Vancomycin was discontinued, continue cefepime  ? ?Community-acquired pneumonia ?-Started on antibiotics as above ? ?Acute metabolic encephalopathy ?-Patient has underlying dementia ?-Has behavior disturbance, no improvement with Zyprexa, Ativan and Haldol ?-Improved after increasing Depakote to 500 mg 3 times daily ?-Continue olanzapine 5 mg p.o. 3 times daily ? ?Essential hypertension ?-Blood pressure stable ?-Continue metoprolol 25 mg p.o. twice daily ? ?Normocytic anemia ?-Hemoglobin dropped to 7.4 today yesterday ?-Today hemoglobin is up to 8.5 ? ?Medications ? ?  ? chlorhexidine  15 mL Mouth Rinse BID  ? enoxaparin (LOVENOX) injection  40 mg Subcutaneous Q24H  ? escitalopram  10 mg Oral Daily  ? famotidine  20 mg Oral BID  ? ferrous sulfate  324 mg Oral Q breakfast  ? folic acid  1 mg Oral Daily  ? liver oil-zinc oxide   Topical TID  ? loratadine  10 mg Oral Daily  ? magnesium oxide  400 mg Oral BID  ? mouth rinse  15 mL Mouth Rinse q12n4p  ? metoprolol tartrate  25 mg Oral BID  ? multivitamin with minerals  1 tablet Oral Daily  ? OLANZapine  5 mg Oral TID  ? tamsulosin  0.4 mg Oral QPC supper  ? thiamine  100 mg Oral  Daily  ? traZODone  100 mg Oral QHS  ? ? ? Data Reviewed:  ? ?CBG: ? ?Recent Labs  ?Lab 04/22/2022 ?1728  ?GLUCAP 99  ? ? ?SpO2: 90 %  ? ? ?Vitals:  ? 04/12/22 0424 04/12/22 1326 04/12/22 2042 04/13/22 0448  ?BP: (!) 99/38 (!) 128/95 117/66 131/75  ?Pulse: 68 74 86 88  ?Resp: '20 19 16 18  '$ ?Temp: 98 ?F (36.7 ?C) 97.6 ?F (36.4 ?C) 98.5 ?F (36.9 ?C) 98.4 ?F (36.9 ?C)  ?TempSrc:  Oral Oral Oral  ?SpO2: 92% 96% 91% 90%  ?Weight:      ?Height:      ? ? ? ? ?Data Reviewed: ? ?Basic Metabolic Panel: ?Recent Labs  ?Lab 03/27/2022 ?1528 03/28/2022 ?1636 04/12/22 ?0518  ?NA 136 137 136  ?K 3.6 3.6 3.7  ?CL 106 102 108  ?CO2 22  --  21*  ?GLUCOSE 108* 111* 94  ?BUN '17 14 17  '$ ?CREATININE 0.81 0.80 0.73  ?CALCIUM 8.5*  --  8.4*  ? ? ?CBC: ?Recent Labs  ?Lab 04/12/2022 ?1528 04/06/2022 ?1636 04/12/22 ?0518 04/13/22 ?8841  ?WBC 12.4*  --  9.0 8.4  ?NEUTROABS 10.1*  --   --   --   ?HGB 7.8* 8.5* 7.4* 8.5*  ?HCT 25.3* 25.0* 23.4* 27.6*  ?MCV 83.5  --  81.8 83.4  ?PLT 303  --  255 285  ? ? ?LFT ?Recent Labs  ?Lab 04/04/2022 ?1528 04/12/22 ?0518  ?AST 15 13*  ?ALT  8 7  ?ALKPHOS 38 36*  ?BILITOT 0.3 0.6  ?PROT 6.0* 5.4*  ?ALBUMIN 2.9* 2.4*  ? ?  ?Antibiotics: ?Anti-infectives (From admission, onward)  ? ? Start     Dose/Rate Route Frequency Ordered Stop  ? 04/12/22 1800  vancomycin (VANCOREADY) IVPB 1500 mg/300 mL  Status:  Discontinued       ? 1,500 mg ?150 mL/hr over 120 Minutes Intravenous Every 24 hours 04/12/2022 2218 04/12/22 1555  ? 04/12/22 0200  ceFEPIme (MAXIPIME) 2 g in sodium chloride 0.9 % 100 mL IVPB       ? 2 g ?200 mL/hr over 30 Minutes Intravenous Every 8 hours 04/25/2022 1910    ? 04/12/22 0015  vancomycin (VANCOCIN) IVPB 1000 mg/200 mL premix  Status:  Discontinued       ? 1,000 mg ?200 mL/hr over 60 Minutes Intravenous  Once 04/04/2022 2328 04/19/2022 2338  ? 04/12/22 0015  ceFEPIme (MAXIPIME) 2 g in sodium chloride 0.9 % 100 mL IVPB  Status:  Discontinued       ? 2 g ?200 mL/hr over 30 Minutes Intravenous  Once 04/02/2022 2328 04/20/2022  2338  ? 03/27/2022 1915  vancomycin (VANCOREADY) IVPB 1500 mg/300 mL  Status:  Discontinued       ? 1,500 mg ?150 mL/hr over 120 Minutes Intravenous Every 24 hours 04/03/2022 1910 03/27/2022 2217  ? 04/10/2022 1745  ceFEPIme (MAXIPIME) 2 g in sodium chloride 0.9 % 100 mL IVPB       ? 2 g ?200 mL/hr over 30 Minutes Intravenous  Once 04/03/2022 1739 03/31/2022 1830  ? 03/28/2022 1745  metroNIDAZOLE (FLAGYL) IVPB 500 mg       ? 500 mg ?100 mL/hr over 60 Minutes Intravenous  Once 04/07/2022 1739 03/31/2022 1906  ? 04/03/2022 1745  vancomycin (VANCOCIN) IVPB 1000 mg/200 mL premix  Status:  Discontinued       ? 1,000 mg ?200 mL/hr over 60 Minutes Intravenous  Once 04/04/2022 1739 04/12/2022 1741  ? 04/23/2022 1745  vancomycin (VANCOREADY) IVPB 1500 mg/300 mL       ? 1,500 mg ?150 mL/hr over 120 Minutes Intravenous  Once 04/19/2022 1741 04/23/2022 2149  ? ?  ? ? ? ?DVT prophylaxis: Lovenox ? ?Code Status: Full code ? ?Family Communication: No family at bedside ? ? ?CONSULTS  ? ? ?Objective  ? ? ?Physical Examination: ? ? ?General-appears in no acute distress ?Heart-S1-S2, regular, no murmur auscultated ?Lungs-clear to auscultation bilaterally, no wheezing or crackles auscultated ?Abdomen-soft, nontender, no organomegaly ?Extremities-no edema in the lower extremities ?Neuro-somnolent but arousable ? ?Status is: Inpatient: Altered mental status ? ? ? ?  ? ? ? ? ? ?[name] ?  ?[facility] ?If 7PM-7AM, please contact night-coverage at [email or web address], ?Office  [phone] ? ? ?04/13/2022, 3:12 PM  LOS: 2 days  ? ? ? ? ? ? ? ? ? ? ?  ?

## 2022-04-13 NOTE — Evaluation (Signed)
Clinical/Bedside Swallow Evaluation ?Patient Details  ?Name: Devin Garza ?MRN: [id number] ?Date of Birth: 29-Mar-1941 ? ?Today's Date: 04/13/2022 ?Time: SLP Start Time (ACUTE ONLY): 3662 SLP Stop Time (ACUTE ONLY): 1625 ?SLP Time Calculation (min) (ACUTE ONLY): 15 min ? ?Past Medical History:  ?Past Medical History:  ?Diagnosis Date  ? Actinic keratosis   ? Anemia   ? Basal cell adenocarcinoma 09/03/2008  ? Qualifier: Diagnosis of  By: Copland MD, Frederico Hamman    Overview:  Overview:  Qualifier: Diagnosis of  By: Lorelei Pont MD, Frederico Hamman   ? Basal cell carcinoma 11/06/2008  ? Right mastoid/neck.   ? Basal cell carcinoma 11/13/2008  ? Left lateral nose medial infraorbital ~2cm lat. to med. canthus. Excised 01/08/2009, margins free.  ? Basal cell carcinoma 10/09/2009  ? Right upper back, paraspinal inf. to base of neck. Excised 12/03/2009  ? Basal cell carcinoma 06/11/2013  ? Left lateral forehead. Excised 07/18/2013, margins free.  ? Basal cell carcinoma 07/30/2015  ? Right nasal ala. Nodular pattern.  ? Basal cell carcinoma 07/30/2015  ? Right mid to sup. helix. Nodular.  ? Basal cell carcinoma 08/25/2016  ? Right mid to sup. helix. Superficial with focal infiltration. Excised 09/28/2016, margins free.  ? Basal cell carcinoma 03/16/2017  ? Right mid to sup. helix. Mixed pattern, ulcerated  ? Basal cell carcinoma 05/10/2018  ? Right nasal tip. BCC with focal sclerosis.  ? Basal cell carcinoma of lip 2007  ? multiple sites  ? Blood transfusion without reported diagnosis   ? ED (erectile dysfunction)   ? GERD (gastroesophageal reflux disease)   ? HLD (hyperlipidemia)   ? Hx of squamous cell carcinoma of skin 08/25/2016  ? L superior pectoral  ? Hypertension   ? Osteoarthritis   ? Peripheral vascular disease ([place])   ? Occlusion and Stenosis of Carotid Artery  ? Squamous cell carcinoma of skin 08/25/2016  ? Left superior pectoral. KA pattern. EDC.  ? Tobacco abuse   ? Tubular adenoma of colon 2005  ? Wears dentures   ? full upper and  lower  ? Wernicke's disease   ? ?Past Surgical History:  ?Past Surgical History:  ?Procedure Laterality Date  ? ANKLE ARTHROSCOPY W/ OPEN REPAIR Left   ? COLONOSCOPY  2006  ? [place]-Tubular adenoma  ? COLONOSCOPY N/A 07/23/2015  ? Procedure: COLONOSCOPY;  Surgeon: Lucilla Lame, MD;  Location: [place];  Service: Gastroenterology;  Laterality: N/A;  WITH BIOPSIES---- ?TRANSVERSE COLON POLYP ?DESCENDING COLON POLYP  X  4  ? ESOPHAGOGASTRODUODENOSCOPY N/A 07/23/2015  ? Procedure: ESOPHAGOGASTRODUODENOSCOPY (EGD);  Surgeon: Lucilla Lame, MD;  Location: [place];  Service: Gastroenterology;  Laterality: N/A;  WITH BIOPSY--- ?DUODENAL BIOPSY ?GASTRIC BIOPSY  ? ESOPHAGOGASTRODUODENOSCOPY N/A 10/28/2016  ? Procedure: ESOPHAGOGASTRODUODENOSCOPY (EGD);  Surgeon: Manya Silvas, MD;  Location: [facility] ENDOSCOPY;  Service: Endoscopy;  Laterality: N/A;  ? ESOPHAGOGASTRODUODENOSCOPY (EGD) WITH PROPOFOL N/A 09/02/2017  ? Procedure: ESOPHAGOGASTRODUODENOSCOPY (EGD) WITH PROPOFOL;  Surgeon: Manya Silvas, MD;  Location: [facility] ENDOSCOPY;  Service: Endoscopy;  Laterality: N/A;  ? SKIN CANCER EXCISION    ? LIP  ? TONSILLECTOMY    ? age 81  ? UPPER GI ENDOSCOPY  12/2009  ? Dr. Ivory Broad gastritisl negative for H. pylori; duodenal biopsies neg for sprue  ? ?HPI:  ?pt is an 81 yo male adm to [facility] with AMS and confusion x3 days prior to admit. In hospital, he was very agitated, attempting to get out of bed multiple times, pulling at lines and not redirectable  over night.  PMH + for alcoholism, essential hypertension, GERD, hyperlipidemia, peripheral vascular disease, basal cell carcinoma, anemia of chronic disease presented with altered mental status, dysphagia, Wernicke's encephalopathy, respiratory failure.  He had a prior MBS study with recommendations for nectar thick liquids, but per palliative note via [facility] - he was non compliant.  CXR 4/16 concerning for Increased bibasilar infiltrates, which may be due to  pneumonia or  edema.  MBS 02/18/2022 showed impaired oral control, premature spillage of boluses into pharynx.  ?  ?Assessment / Plan / Recommendation  ?Clinical Impression ? At this time, [name]'s mentation precludes him from a full po diet. He did not follow any directions, constantly yelled out but did accept 4 tsps total of icecream, applesauce, and nectar juice via tsp.  Congested cough noted with some oral secretions without pt awareness/or clearance independently. He immediately slid down in bed after repositioning and thrashed about.  No overt indication of aspiration with boluses provided however pt with wet cough and he had dysphagia per prior MBS 01/2022. Recommend NPO x medicine with puree, few tsps ice cream or nectar liquid given by RN only.  Will follow up next date for po readiness.  Of note, pt did not consume nectar thick liquid advised during prior MBS per palliative note and suspect as mentation resolves, his judgement will be the same. ?SLP Visit Diagnosis: Dysphagia, oropharyngeal phase (R13.12) ?   ?Aspiration Risk ? Severe aspiration risk;Risk for inadequate nutrition/hydration  ?  ?Diet Recommendation  (icecream, tsps nectar, medicine only - no other po)  ? ?Liquid Administration via: Spoon ?Medication Administration: Crushed with puree ?Compensations: Slow rate;Small sips/bites ?Postural Changes: Seated upright at 90 degrees;Remain upright for at least 30 minutes after po intake  ?  ?Other  Recommendations Oral Care Recommendations: Oral care BID ?Other Recommendations: Have oral suction available   ? ?Recommendations for follow up therapy are one component of a multi-disciplinary discharge planning process, led by the attending physician.  Recommendations may be updated based on patient status, additional functional criteria and insurance authorization. ? ?Follow up Recommendations Skilled nursing-short term rehab (<3 hours/day)  ? ? ?  ?Assistance Recommended at Discharge Frequent or  constant Supervision/Assistance  ?Functional Status Assessment Patient has had a recent decline in their functional status and demonstrates the ability to make significant improvements in function in a reasonable and predictable amount of time.  ?Frequency and Duration min 1 x/week  ?1 week ?  ?   ? ?Prognosis Prognosis for Safe Diet Advancement: Guarded ?Barriers to Reach Goals: Time post onset;Other (Comment) (hx of not following dietary restrictions)  ? ?  ? ?Swallow Study   ?General HPI: pt is an 81 yo male adm to [facility] with AMS and confusion x3 days prior to admit. In hospital, he was very agitated, attempting to get out of bed multiple times, pulling at lines and not redirectable over night.  PMH + for alcoholism, essential hypertension, GERD, hyperlipidemia, peripheral vascular disease, basal cell carcinoma, anemia of chronic disease presented with altered mental status, dysphagia, Wernicke's encephalopathy, respiratory failure.  He had a prior MBS study with recommendations for nectar thick liquids, but per palliative note via [facility] - he was non compliant.  CXR 4/16 concerning for Increased bibasilar infiltrates, which may be due to pneumonia or  edema.  MBS 02/18/2022 showed impaired oral control, premature spillage of boluses into pharynx. ?Type of Study: Bedside Swallow Evaluation ?Previous Swallow Assessment: see HPI ?Diet Prior to this Study: NPO ?Temperature Spikes  Noted: No ?Respiratory Status: Room air ?History of Recent Intubation: No ?Behavior/Cognition: Uncooperative;Impulsive;Doesn't follow directions ?Oral Cavity Assessment: Dry ?Oral Care Completed by SLP: Yes (with oral suction, pt constantly moving in bed, thus oral care done carefully) ?Oral Cavity - Dentition: Edentulous ?Vision: Impaired for self-feeding ?Self-Feeding Abilities: Total assist;Refused PO (pt accepted only tsp applesauce, 2 tsps of icecream and tsp nectar thick juice only) ?Patient Positioning: Other (comment);Postural  control interferes with function ?Baseline Vocal Quality: Normal (pt yelling out and demonstrates strong voice) ?Volitional Cough: Cognitively unable to elicit ?Volitional Swallow: Unable to elicit  ?  ?Oral/

## 2022-04-14 ENCOUNTER — Inpatient Hospital Stay (HOSPITAL_COMMUNITY): Payer: Medicare Other

## 2022-04-14 DIAGNOSIS — I10 Essential (primary) hypertension: Secondary | ICD-10-CM | POA: Diagnosis not present

## 2022-04-14 DIAGNOSIS — G9341 Metabolic encephalopathy: Secondary | ICD-10-CM | POA: Diagnosis not present

## 2022-04-14 DIAGNOSIS — J189 Pneumonia, unspecified organism: Secondary | ICD-10-CM | POA: Diagnosis not present

## 2022-04-14 DIAGNOSIS — F1011 Alcohol abuse, in remission: Secondary | ICD-10-CM | POA: Diagnosis not present

## 2022-04-14 IMAGING — DX DG CHEST 1V PORT
1 series · 1 of 1 positions shown · non-contrast
Comparison: [DATE]

CLINICAL DATA: Respiratory compromise.

EXAM:
PORTABLE CHEST 1 VIEW

[chest ap]
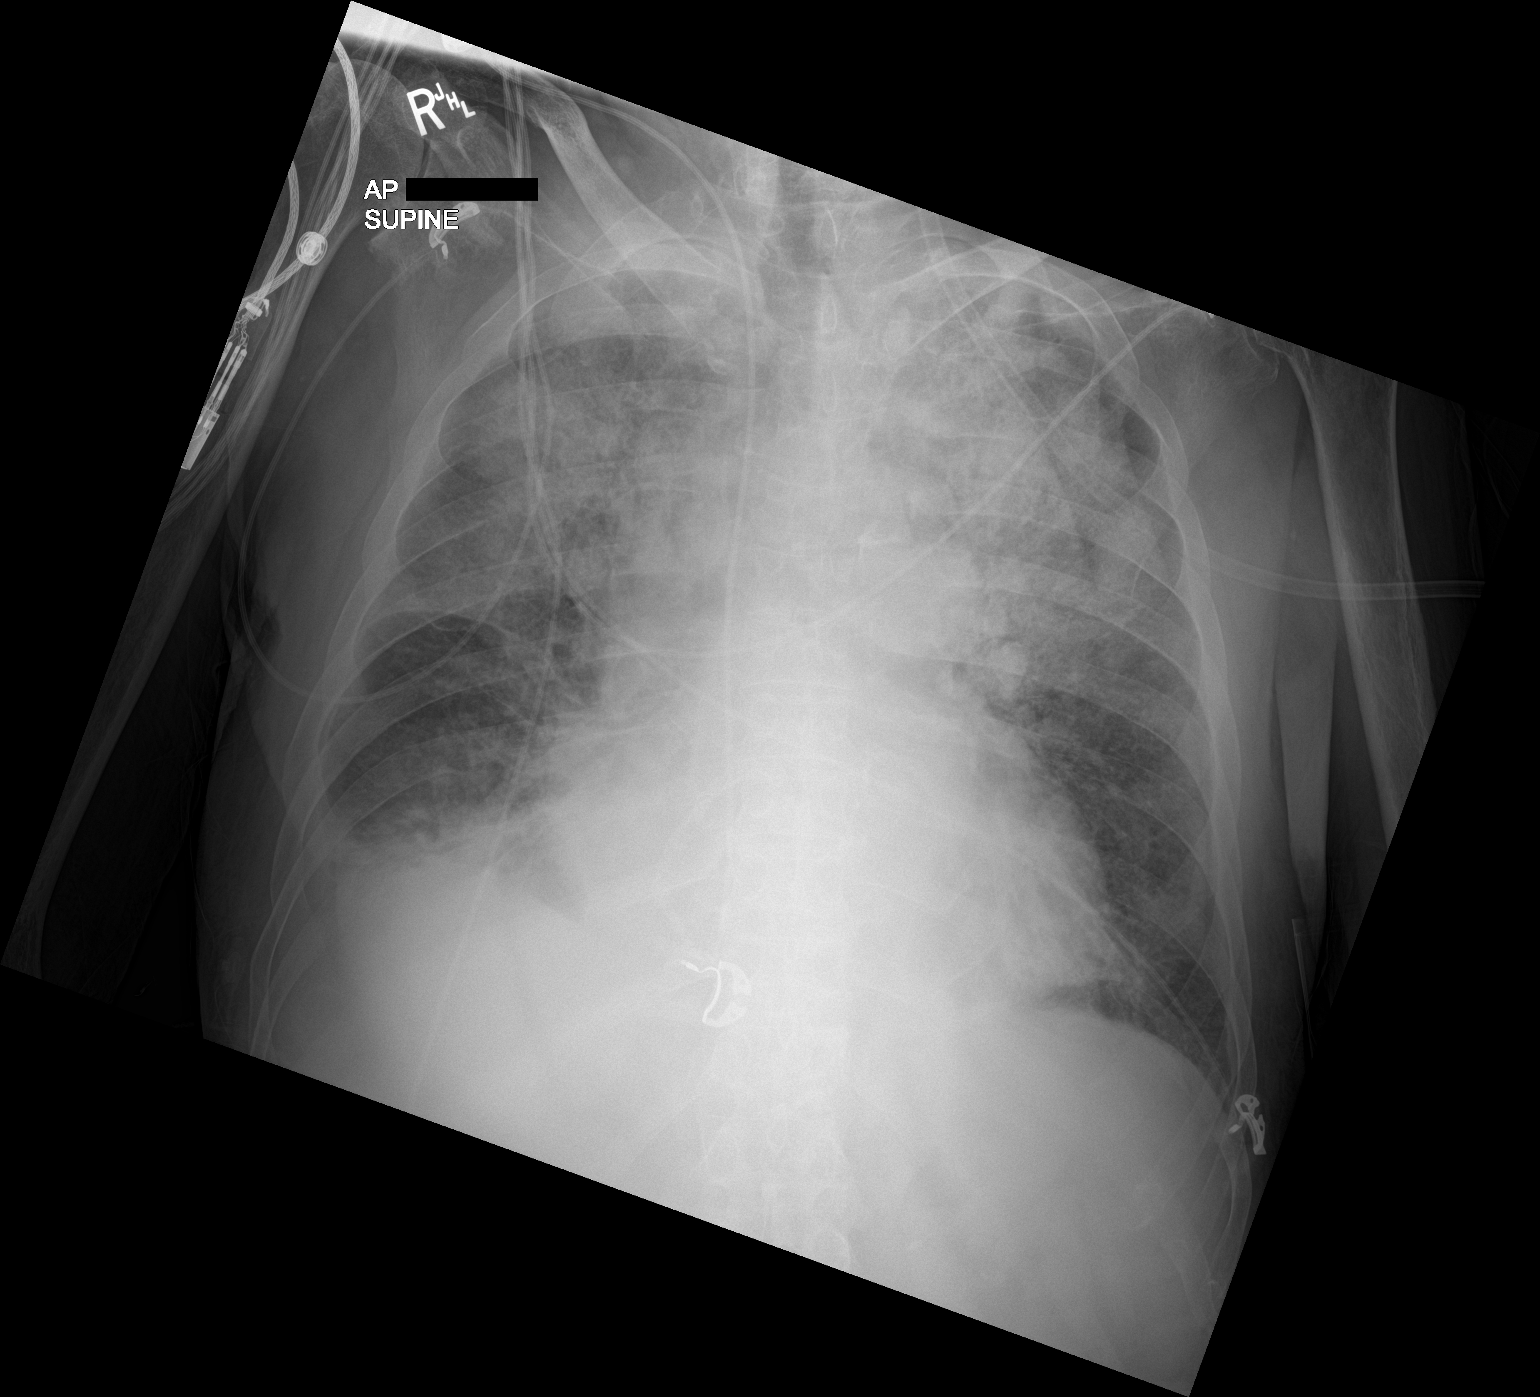

[1 of 1 positions shown; findings below may reference images not displayed]

FINDINGS: Marked severity patchy areas of airspace disease are seen
bilaterally. This is most prominent within the bilateral upper
lobes. There is a very small right pleural effusion. No pneumothorax
is identified. The heart size and mediastinal contours are within
normal limits. The visualized skeletal structures are unremarkable.
IMPRESSION: Marked severity bilateral airspace disease, most prominent within
the upper lobes.

## 2022-04-14 MED ORDER — METOPROLOL TARTRATE 5 MG/5ML IV SOLN
2.5000 mg | Freq: Once | INTRAVENOUS | Status: AC
  Administered 2022-04-14: 2.5 mg via INTRAVENOUS
  Filled 2022-04-14: qty 5

## 2022-04-14 MED ORDER — VALPROATE SODIUM 100 MG/ML IV SOLN
250.0000 mg | Freq: Three times a day (TID) | INTRAVENOUS | Status: DC
  Administered 2022-04-14 – 2022-04-15 (×3): 250 mg via INTRAVENOUS
  Filled 2022-04-14 (×6): qty 2.5

## 2022-04-14 NOTE — Progress Notes (Addendum)
? ? ?  OVERNIGHT PROGRESS REPORT ? ?Notified by RN and Rapid Response RN, for concerns over respiratory status, tachypnea, agitation, "congested sounding", coughing, requiring more oxygen. ? ?At bedside patient was restless, congested upper airway,rhonchi, tachypneic. ?Staff placed NRBM prior to my arrival with some improvement. ?Patient was also unable to take p.o. metoprolol. ? ?Stat portable chest x-ray was ordered, IV metoprolol was ordered to cover p.o. dose, RT administered breathing treatment. ?NT suctioning was also used by RT. ? ?Respirations improved but still tachypneic patient still sounding out verbally as had been reportedly occurring previously. ?Patient was given single dose morphine for potential air hunger. ?-------------------------- ?CXR in part: ? ?"FINDINGS: ?Marked severity patchy areas of airspace disease are seen ?bilaterally. This is most prominent within the bilateral upper ?lobes. There is a very small right pleural effusion. No pneumothorax ?is identified. The heart size and mediastinal contours are within ?normal limits. The visualized skeletal structures are unremarkable. ?  ?IMPRESSION: ?Marked severity bilateral airspace disease, most prominent within ?the upper lobes. ?  ?Electronically Signed ?  By: Virgina Norfolk M.D. ?  On: 04/14/2022 21:34" ?------------------------------ ? ?Marked improvement after administration of morphine, patient is able to rest now and WOB is reduced. SPO2 is maintained.  ? ?Spoke with Drema Pry (Son. Medical POA, paperwork being obtained by staff at this time) ?Patient is to be DNR  ? ?Patient remains in room. ? ? ?Gershon Cull MSNA MSN [id number]-AG ?Acute Care Nurse Practitioner ?[facility] ?[place] ? ? ? ?

## 2022-04-14 NOTE — Progress Notes (Signed)
Pts sister from [place] ) called in for an update, I explained his current status and how seriously ill he is and that im awaiting another evaluation from the oncall provider to see if pt will transfer to ICU tonight because of his deteriorating respiratory status. She became tearful as we talked about Devin Garza and I asked her to notify [name] and have him call me so I could update him. A few minutes later [name] called and I spoke with him & his wife regarding patients continued decline and how bad his respiratory status currently in. [name] reports his father is actually a DNR and that he has the paperwork [name] is the [facility]). I let [name] speak with [name] NP to report that major information.  ?

## 2022-04-14 NOTE — Progress Notes (Signed)
?   04/14/22 2205  ?Vitals  ?Temp 97.9 ?F (36.6 ?C)  ?Temp Source Oral  ?BP 103/78  ?MAP (mmHg) 85  ?BP Location Left Arm  ?BP Method Automatic  ?Patient Position (if appropriate) Lying  ?Pulse Rate (!) 111  ?Resp (!) 44  ?MEWS COLOR  ?MEWS Score Color Red  ?Oxygen Therapy  ?SpO2 99 %  ?O2 Device Non-rebreather Mask  ?O2 Flow Rate (L/min) 15 L/min  ?MEWS Score  ?MEWS Temp 0  ?MEWS Systolic 0  ?MEWS Pulse 2  ?MEWS RR 3  ?MEWS LOC 1  ?MEWS Score 6  ? ?Continue to monitor and await new orders  ?

## 2022-04-14 NOTE — Progress Notes (Signed)
?   04/14/22 1504  ?Assess: MEWS Score  ?Temp 98.3 ?F (36.8 ?C)  ?BP 123/82  ?Pulse Rate 67  ?Resp (!) 34 ?(Dema Timmons LPN and [employer] RN notified of mews)  ?SpO2 94 %  ?Assess: MEWS Score  ?MEWS Temp 0  ?MEWS Systolic 0  ?MEWS Pulse 0  ?MEWS RR 2  ?MEWS LOC 1  ?MEWS Score 3  ?MEWS Score Color Yellow  ?Assess: if the MEWS score is Yellow or Red  ?Were vital signs taken at a resting state? Yes  ?Focused Assessment No change from prior assessment  ?Does the patient meet 2 or more of the SIRS criteria? No  ?MEWS guidelines implemented *See Row Information* Yes  ?Treat  ?MEWS Interventions Other (Comment) ?(no escalation needed)  ?Take Vital Signs  ?Increase Vital Sign Frequency  Yellow: Q 2hr X 2 then Q 4hr X 2, if remains yellow, continue Q 4hrs  ?Escalate  ?MEWS: Escalate Yellow: discuss with charge nurse/RN and consider discussing with provider and RRT  ?Notify: Charge Nurse/RN  ?Name of Charge Nurse/RN Notified Aliene Beams RN  ?Date Charge Nurse/RN Notified 04/14/22  ?Time Charge Nurse/RN Notified 1505  ?Document  ?Patient Outcome Other (Comment) ?(no treatment needed)  ?Progress note created (see row info) Yes  ?Assess: SIRS CRITERIA  ?SIRS Temperature  0  ?SIRS Pulse 0  ?SIRS Respirations  1  ?SIRS WBC 0  ?SIRS Score Sum  1  ? ?Patient in a yellow MEWS due to elevated respirations. Patient is very agitated and restless in restraints, he is pulling and sliding all over the bed. PRN Ativan given earlier in the day with no relief. Charge nurse notified. MEWS guidelines implemented. Will continue to monitor patient. ?

## 2022-04-14 NOTE — Progress Notes (Addendum)
Speech Language Pathology Treatment: Dysphagia  ?Patient Details ?Name: Devin Garza ?MRN: [id number] ?DOB: May 21, 1941 ?Today's Date: 04/14/2022 ?Time: 2585-2778 ?SLP Time Calculation (min) (ACUTE ONLY): 10 min ? ?Assessment / Plan / Recommendation ?Clinical Impression ? Devin Garza seen to address dysphagia goals given reordered evaluation. Posterior oral cavity noted with retention of dried secretions - Devin Garza resistant to oral care.  RN reports Devin Garza accepted ice cream - and medication crushed but requiring sternal rub for LOA.  Devin Garza hollaring upon SlP entrance to room.  Devin Garza repositioned with NT - RR up to 44 while hollaring and down to 24 when at rest. Trials of ice chips conducted, ice chips brought to mouth/lips for thermal stimulation-and repeatedly touched to lip to prompt opening/acceptance to no avail.   While hollaring, Devin Garza has congested breathing - at rest is clear.  His mentation does not allow po intake - recommend only vital medications when fully alert.  Given Devin Garza required NTS - he is not managing secretions - and a feeding tube would not prevent aspiration and anticipate Devin Garza's agitation may contribute to risk of it being removed.   ? ?Unless, Devin Garza's mentation resolves, do not anticipate he will be appropriate for po diet -as he is either alert and hollaring or lethargic.  RN reports potential palliative consult - SLP agrees.  Will continue efforts but his prognosis for swallow function to recovery is guarded given current status and prior hx of dysphagia. No family present during treatment session.  ? ?  ?HPI HPI: Devin Garza is an 81 yo male adm to [facility] with AMS and confusion x3 days prior to admit. In hospital, he was very agitated, attempting to get out of bed multiple times, pulling at lines and not redirectable over night.  PMH + for alcoholism, essential hypertension, GERD, hyperlipidemia, peripheral vascular disease, basal cell carcinoma, anemia of chronic disease presented with altered mental status, dysphagia, Wernicke's  encephalopathy, respiratory failure.  He had a prior MBS study with recommendations for nectar thick liquids, but per palliative note via [facility] - he was non compliant.  CXR 4/16 concerning for Increased bibasilar infiltrates, which may be due to pneumonia or  edema.  MBS 02/18/2022 showed impaired oral control, premature spillage of boluses into pharynx.  SlP follow up conducted due to reorder of evaluation. ?  ?   ?SLP Plan ? Continue with current plan of care ? ?  ?  ?Recommendations for follow up therapy are one component of a multi-disciplinary discharge planning process, led by the attending physician.  Recommendations may be updated based on patient status, additional functional criteria and insurance authorization. ?  ? ?Recommendations  ?Diet recommendations: NPO ?Medication Administration: Crushed with puree ?Supervision: Staff to assist with self feeding ?Compensations: Slow rate;Small sips/bites ?Postural Changes and/or Swallow Maneuvers: Seated upright 90 degrees;Upright 30-60 min after meal  ?   ?    ?   ? ? ? ? Oral Care Recommendations: Oral care BID ?Follow Up Recommendations: Skilled nursing-short term rehab (<3 hours/day) ?Assistance recommended at discharge: Frequent or constant Supervision/Assistance ?SLP Visit Diagnosis: Dysphagia, oropharyngeal phase (R13.12) ?Plan: Continue with current plan of care ? ? ? ? ?  ? Kathleen Lime, MS CCC SLP ?Acute Rehab Services ?Office [phone] ?Pager [phone] ? ? ?Macario Golds ? ?04/14/2022, 9:09 AM ?

## 2022-04-14 NOTE — Progress Notes (Signed)
Pharmacy Antibiotic Note ? ?Devin Garza is a 81 y.o. male admitted on 04/24/2022 with altered mental status and cough, found to have increased bibasilar infiltrates on CXR - possibly due to pneumonia vs edema. Pharmacy has been consulted for cefepime dosing. ? ?WBC wnl, Tm 99.3 ? ?Plan: ?Continue cefepime 2g IV q8 hours ?Complete total of 5 days per discussion with MD ? ?Pharmacy to formally sign off consult but will continue to monitor patient peripherally.  ? ? ?Height: '5\' 8"'$  (172.7 cm) ?Weight: 73.2 kg (161 lb 6 oz) ?IBW/kg (Calculated) : 68.4 ? ?Temp (24hrs), Avg:98.4 ?F (36.9 ?C), Min:97.9 ?F (36.6 ?C), Max:99.3 ?F (37.4 ?C) ? ?Recent Labs  ?Lab 04/12/2022 ?1528 04/16/2022 ?1545 04/19/2022 ?1636 03/29/2022 ?1829 04/12/22 ?0037 04/12/22 ?0518 04/13/22 ?6144  ?WBC 12.4*  --   --   --   --  9.0 8.4  ?CREATININE 0.81  --  0.80  --   --  0.73  --   ?LATICACIDVEN  --  2.3*  --  1.0 0.7  --   --   ? ?  ?Estimated Creatinine Clearance: 70.1 mL/min (by C-G formula based on SCr of 0.73 mg/dL).   ? ?No Known Allergies ? ?Antimicrobials this admission: ?Vancomycin 4/16 >> 4/17 ?Cefepime 4/16 >> 4/20 ? ?Dose adjustments this admission: ? ? ?Microbiology results: ?4/16 BCx: ngtd ?4/17 RXV:QMGQ ?4/16 MRSA PCR: neg ?4/16 Ucx: ngtd ? ?Thank you for allowing pharmacy to be a part of this patient?s care. ? ?Dimple Nanas, PharmD ?04/14/2022 10:57 AM ? ?

## 2022-04-14 NOTE — Plan of Care (Signed)
  Problem: Education: Goal: Knowledge of General Education information will improve Description: Including pain rating scale, medication(s)/side effects and non-pharmacologic comfort measures Outcome: Progressing   Problem: Clinical Measurements: Goal: Ability to maintain clinical measurements within normal limits will improve Outcome: Progressing   Problem: Clinical Measurements: Goal: Will remain free from infection Outcome: Progressing   Problem: Clinical Measurements: Goal: Respiratory complications will improve Outcome: Progressing   

## 2022-04-14 NOTE — Significant Event (Signed)
Rapid Response Event Note  ? ?Reason for Call :  ?Respiratory distress, restlessness, thick secretions. ? ?Initial Focused Assessment:  ?Arrived to find patient with NRB on, respiratory rate 30-40, not verbally responsive or opening eyes, not following commands, intermittently restless. Lung sounds with rhonchi and diminished bases bilaterally. Poor urine output noted with IVF running at 19m/hr. Patient in bilateral wrist restraints as he had been intermittently pulling at tubes. No recent sedating meds given. Staff report that restlessness decreased some when NRB placed. Vital signs are unremarkable except for respiratory rate. Patient currently on continuous oxygen saturation monitoring and telemetry. ? ?Interventions:  ?Focused physical assessment and chart review. Communication sent to provider on call who came to bedside for further assessment. CXR ordered and completed. No significant change from previous. Respiratory came to bedside and deep suctioned thick mucus from back of throat. Bladder scanned reveals that patient only had 789min bladder.  ? ?Plan of Care:  ?Symptoms felt to all be related to respiratory decline and/or possible aspiration, but no new information found with diagnostic or physical assessments. Will keep patient NPO and provide frequent oral care and suctioning as needed. ?Bedside RN and on call provider spoke to family. Patient code status changed to DNR as according to HCPOA, BrAndrewjames Weirauchson), states he already has paperwork that this was pre-admission decision. BrAaron Edelmanill make arrangements with unit staff to get documents into chart here. ?Patient will remain in current bed, staff will titrate oxygen devices to need/demand, call back to RR nurse if needed for unmanaged symptoms or distress. ? ? ?Event Summary:  ? ?MD Notified: 2038 JaGershon CullNP ?Call Time: 2038 ?Arrival Time: 2043 ?End Time: 2245 ? ?AmSelinda MichaelsRN ?

## 2022-04-14 NOTE — Progress Notes (Signed)
[facility] ? ?PROGRESS NOTE ? ?Devin Garza VQQ:[id number] DOB: [birth year] DOA: 04/19/2022 ?PCP: Isaac Bliss, Rayford Halsted, MD ? ? ?Brief HPI:   ? ?81 year old male with medical history of alcoholism, essential hypertension, GERD, hyperlipidemia, peripheral vascular disease, basal cell carcinoma, anemia of chronic disease presented with altered mental status.  Patient has dementia at baseline.  Has been confused over past few days.  Chest x-ray showed increased bibasilar infiltrates, which may be due to pneumonia or edema. ?Patient started on IV antibiotics ? ? ? ?Subjective  ? ?Patient seen and examined, has been sleeping this morning. ? ? Assessment/Plan:  ? ? ?Sepsis due to pneumonia ?-Patient admitted for community-acquired pneumonia ?-Started on vancomycin and cefepime ?-Vancomycin was discontinued, continue cefepime  ? ?Hypersomnolence ?-Developed after Depakote was changed to 500 mg p.o. 3 times daily ?-We will cut down the Depakote back to 250 mg p.o. 3 times daily which she was taking at home ?-Depakote dose was increased due to worsening agitation ? ?Dysphagia ?-Patient is severe aspiration risk due to altered mental status ?-Currently n.p.o. except for meds with nectar thick liquid ? ? ?Community-acquired pneumonia ?-Started on antibiotics as above ?-We will complete 5 days of cefepime ? ?Acute metabolic encephalopathy ?-Patient has underlying dementia ?-Has behavior disturbance, no improvement with Zyprexa, Ativan and Haldol ?-Improved after increasing Depakote to 500 mg 3 times daily; however patient became hypersomnolent, will change Depakote dose to 250 mg p.o. 3 times daily ?-Continue olanzapine 5 mg p.o. 3 times daily ? ?Essential hypertension ?-Blood pressure stable ?-Continue metoprolol 25 mg p.o. twice daily ? ?Normocytic anemia ?-Hemoglobin dropped to 7.4 today yesterday ?-Today hemoglobin is up to 8.5 ? ?Goals of care ?-Called and discussed with patient's son and daughter-in-law on  phone, explained to them that patient has inadequate p.o. intake.  Discussed artificial nutrition with PEG tube, explained the risks and the benefits.  At this time they do not want PEG tube nutrition.  We will consult palliative care for further clarification of goals of care.  If patient does not improve in the next few days, he may be a candidate for residential hospice ? ?Medications ? ?  ? chlorhexidine  15 mL Mouth Rinse BID  ? enoxaparin (LOVENOX) injection  40 mg Subcutaneous Q24H  ? escitalopram  10 mg Oral Daily  ? famotidine  20 mg Oral BID  ? ferrous sulfate  324 mg Oral Q breakfast  ? folic acid  1 mg Oral Daily  ? liver oil-zinc oxide   Topical TID  ? loratadine  10 mg Oral Daily  ? magnesium oxide  400 mg Oral BID  ? mouth rinse  15 mL Mouth Rinse q12n4p  ? metoprolol tartrate  25 mg Oral BID  ? multivitamin with minerals  1 tablet Oral Daily  ? OLANZapine  5 mg Oral TID  ? tamsulosin  0.4 mg Oral QPC supper  ? thiamine  100 mg Oral Daily  ? traZODone  100 mg Oral QHS  ? ? ? Data Reviewed:  ? ?CBG: ? ?Recent Labs  ?Lab 04/10/2022 ?1728  ?GLUCAP 99  ? ? ?SpO2: 98 % ?O2 Flow Rate (L/min): 4 L/min  ? ? ?Vitals:  ? 04/13/22 2229 04/14/22 0206 04/14/22 0356 04/14/22 0400  ?BP:   134/84   ?Pulse: 97  88   ?Resp:      ?Temp:   97.9 ?F (36.6 ?C)   ?TempSrc:   Axillary   ?SpO2: 96% 98% 90% 98%  ?Weight:      ?  Height:      ? ? ? ? ?Data Reviewed: ? ?Basic Metabolic Panel: ?Recent Labs  ?Lab 04/24/2022 ?1528 04/22/2022 ?1636 04/12/22 ?0518  ?NA 136 137 136  ?K 3.6 3.6 3.7  ?CL 106 102 108  ?CO2 22  --  21*  ?GLUCOSE 108* 111* 94  ?BUN '17 14 17  '$ ?CREATININE 0.81 0.80 0.73  ?CALCIUM 8.5*  --  8.4*  ? ? ?CBC: ?Recent Labs  ?Lab 04/05/2022 ?1528 04/07/2022 ?1636 04/12/22 ?0518 04/13/22 ?1324  ?WBC 12.4*  --  9.0 8.4  ?NEUTROABS 10.1*  --   --   --   ?HGB 7.8* 8.5* 7.4* 8.5*  ?HCT 25.3* 25.0* 23.4* 27.6*  ?MCV 83.5  --  81.8 83.4  ?PLT 303  --  255 285  ? ? ?LFT ?Recent Labs  ?Lab 04/24/2022 ?1528 04/12/22 ?0518  ?AST 15 13*   ?ALT 8 7  ?ALKPHOS 38 36*  ?BILITOT 0.3 0.6  ?PROT 6.0* 5.4*  ?ALBUMIN 2.9* 2.4*  ? ?  ?Antibiotics: ?Anti-infectives (From admission, onward)  ? ? Start     Dose/Rate Route Frequency Ordered Stop  ? 04/12/22 1800  vancomycin (VANCOREADY) IVPB 1500 mg/300 mL  Status:  Discontinued       ? 1,500 mg ?150 mL/hr over 120 Minutes Intravenous Every 24 hours 04/18/2022 2218 04/12/22 1555  ? 04/12/22 0200  ceFEPIme (MAXIPIME) 2 g in sodium chloride 0.9 % 100 mL IVPB       ? 2 g ?200 mL/hr over 30 Minutes Intravenous Every 8 hours 04/03/2022 1910 04/15/22 2359  ? 04/12/22 0015  vancomycin (VANCOCIN) IVPB 1000 mg/200 mL premix  Status:  Discontinued       ? 1,000 mg ?200 mL/hr over 60 Minutes Intravenous  Once 04/25/2022 2328 04/14/2022 2338  ? 04/12/22 0015  ceFEPIme (MAXIPIME) 2 g in sodium chloride 0.9 % 100 mL IVPB  Status:  Discontinued       ? 2 g ?200 mL/hr over 30 Minutes Intravenous  Once 04/06/2022 2328 03/30/2022 2338  ? 04/10/2022 1915  vancomycin (VANCOREADY) IVPB 1500 mg/300 mL  Status:  Discontinued       ? 1,500 mg ?150 mL/hr over 120 Minutes Intravenous Every 24 hours 04/19/2022 1910 04/22/2022 2217  ? 03/28/2022 1745  ceFEPIme (MAXIPIME) 2 g in sodium chloride 0.9 % 100 mL IVPB       ? 2 g ?200 mL/hr over 30 Minutes Intravenous  Once 03/28/2022 1739 04/05/2022 1830  ? 03/30/2022 1745  metroNIDAZOLE (FLAGYL) IVPB 500 mg       ? 500 mg ?100 mL/hr over 60 Minutes Intravenous  Once 04/20/2022 1739 04/10/2022 1906  ? 04/15/2022 1745  vancomycin (VANCOCIN) IVPB 1000 mg/200 mL premix  Status:  Discontinued       ? 1,000 mg ?200 mL/hr over 60 Minutes Intravenous  Once 03/28/2022 1739 04/04/2022 1741  ? 04/16/2022 1745  vancomycin (VANCOREADY) IVPB 1500 mg/300 mL       ? 1,500 mg ?150 mL/hr over 120 Minutes Intravenous  Once 03/29/2022 1741 04/19/2022 2149  ? ?  ? ? ? ?DVT prophylaxis: Lovenox ? ?Code Status: Full code ? ?Family Communication: No family at bedside ? ? ?CONSULTS  ? ? ?Objective  ? ? ?Physical Examination: ? ? ?General-appears in no acute  distress ?Heart-S1-S2, regular, no murmur auscultated ?Lungs-clear to auscultation bilaterally, no wheezing or crackles auscultated ?Abdomen-soft, nontender, no organomegaly ?Extremities-no edema in the lower extremities ?Neuro-somnolent but arousable ? ?Status is: Inpatient: Altered mental status ? ? ? ?  ? ? ? ? ? ?  Devin Garza ?  ?[facility] ?If 7PM-7AM, please contact night-coverage at [email or web address], ?Office  [phone] ? ? ?04/14/2022, 2:36 PM  LOS: 3 days  ? ? ? ? ? ? ? ? ? ? ?  ?

## 2022-04-14 NOTE — Progress Notes (Signed)
2030 pt was noted to have a RR =44 with O2 sat 83-87% on 4 lnc. Increased to 5 L and oral sx with [place] for moderate amt very thick tan/black secretions. Notified charge RN Tom and RR nurse, Amion text page/ secure chat to on call [name] by Loel Dubonnet RN to alert him of pts status.  I placed pt on a NRB and he began to calm down and not thrash or moan for about 20+ minutes.  ?All met at the bedside for evaluation and monitoring.  ?

## 2022-04-14 NOTE — Consult Note (Signed)
? ?  [facility] CM Inpatient Consult ? ? ?04/14/2022 ? ?[name] ?1941-01-07 ?[id number] ? ?[facility] [ACO] Patient: Medicare ACO [place] ? ?Coverage for [name] HL review: ? ?Primary Care Provider:  Isaac Bliss, Rayford Halsted, MD, Verdon Brassfield, is an embedded provider with a Chronic Care Management team and program, and is listed for the transition of care follow up and appointments. ? ?Patient was screened for extreme high risk score Embedded practice service needs for chronic care management and notes that the patient was being followed by [facility].  Current disposition would be helpful from PT/OT evals.  ? ?Plan: Following patient for progress for post hospital needs. ? ?Please contact for further questions, ? ?Natividad Brood, RN BSN CCM ?[facility] Liaison ? [phone] business mobile phone ?Toll free office [phone]  ?Fax number: [phone] ?[email or web address] ? ? ? ?

## 2022-04-14 NOTE — Care Management Important Message (Signed)
Important Message ? ?Patient Details  Palliative ?Name: Devin Garza ?MRN: [id number] ?Date of Birth: 1941-09-24 ? ? ?Medicare Important Message Given:  No ? ? ? ? ?Kerin Salen ?04/14/2022, 1:06 PM ?

## 2022-04-14 NOTE — Progress Notes (Signed)
Patient is very agitated and restless, try to get out of the bed, pull lines all night long. Multiple attempts to redirect during the shift. PRN med given as MD ordered. ?

## 2022-04-15 ENCOUNTER — Inpatient Hospital Stay (HOSPITAL_COMMUNITY): Payer: Medicare Other

## 2022-04-15 DIAGNOSIS — G9341 Metabolic encephalopathy: Secondary | ICD-10-CM | POA: Diagnosis not present

## 2022-04-15 DIAGNOSIS — I10 Essential (primary) hypertension: Secondary | ICD-10-CM | POA: Diagnosis not present

## 2022-04-15 DIAGNOSIS — D509 Iron deficiency anemia, unspecified: Secondary | ICD-10-CM | POA: Diagnosis not present

## 2022-04-15 DIAGNOSIS — J189 Pneumonia, unspecified organism: Secondary | ICD-10-CM | POA: Diagnosis not present

## 2022-04-15 LAB — BASIC METABOLIC PANEL
Anion gap: 11 (ref 5–15)
BUN: 21 mg/dL (ref 8–23)
CO2: 22 mmol/L (ref 22–32)
Calcium: 8.2 mg/dL — ABNORMAL LOW (ref 8.9–10.3)
Chloride: 109 mmol/L (ref 98–111)
Creatinine, Ser: 0.77 mg/dL (ref 0.61–1.24)
GFR, Estimated: >60 mL/min (ref >60)
Glucose, Bld: 122 mg/dL — ABNORMAL HIGH (ref 70–99)
Potassium: 3.4 mmol/L — ABNORMAL LOW (ref 3.5–5.1)
Sodium: 142 mmol/L (ref 135–145)

## 2022-04-15 LAB — CBC
HCT: 27.3 % — ABNORMAL LOW (ref 39.0–52.0)
Hemoglobin: 8.3 g/dL — ABNORMAL LOW (ref 13.0–17.0)
MCH: 25.5 pg — ABNORMAL LOW (ref 26.0–34.0)
MCHC: 30.4 g/dL (ref 30.0–36.0)
MCV: 84 fL (ref 80.0–100.0)
Platelets: 230 10*3/uL (ref 150–400)
RBC: 3.25 MIL/uL — ABNORMAL LOW (ref 4.22–5.81)
RDW: 16.8 % — ABNORMAL HIGH (ref 11.5–15.5)
WBC: 7.3 10*3/uL (ref 4.0–10.5)
nRBC: 0 % (ref 0.0–0.2)

## 2022-04-15 IMAGING — DX DG CHEST 1V PORT SAME DAY
2 series · 2 of 2 positions shown · non-contrast
Comparison: Radiograph [DATE]

CLINICAL DATA: Aspiration pneumonia

EXAM:
PORTABLE CHEST 1 VIEW

[chest ap (1 of 2)]
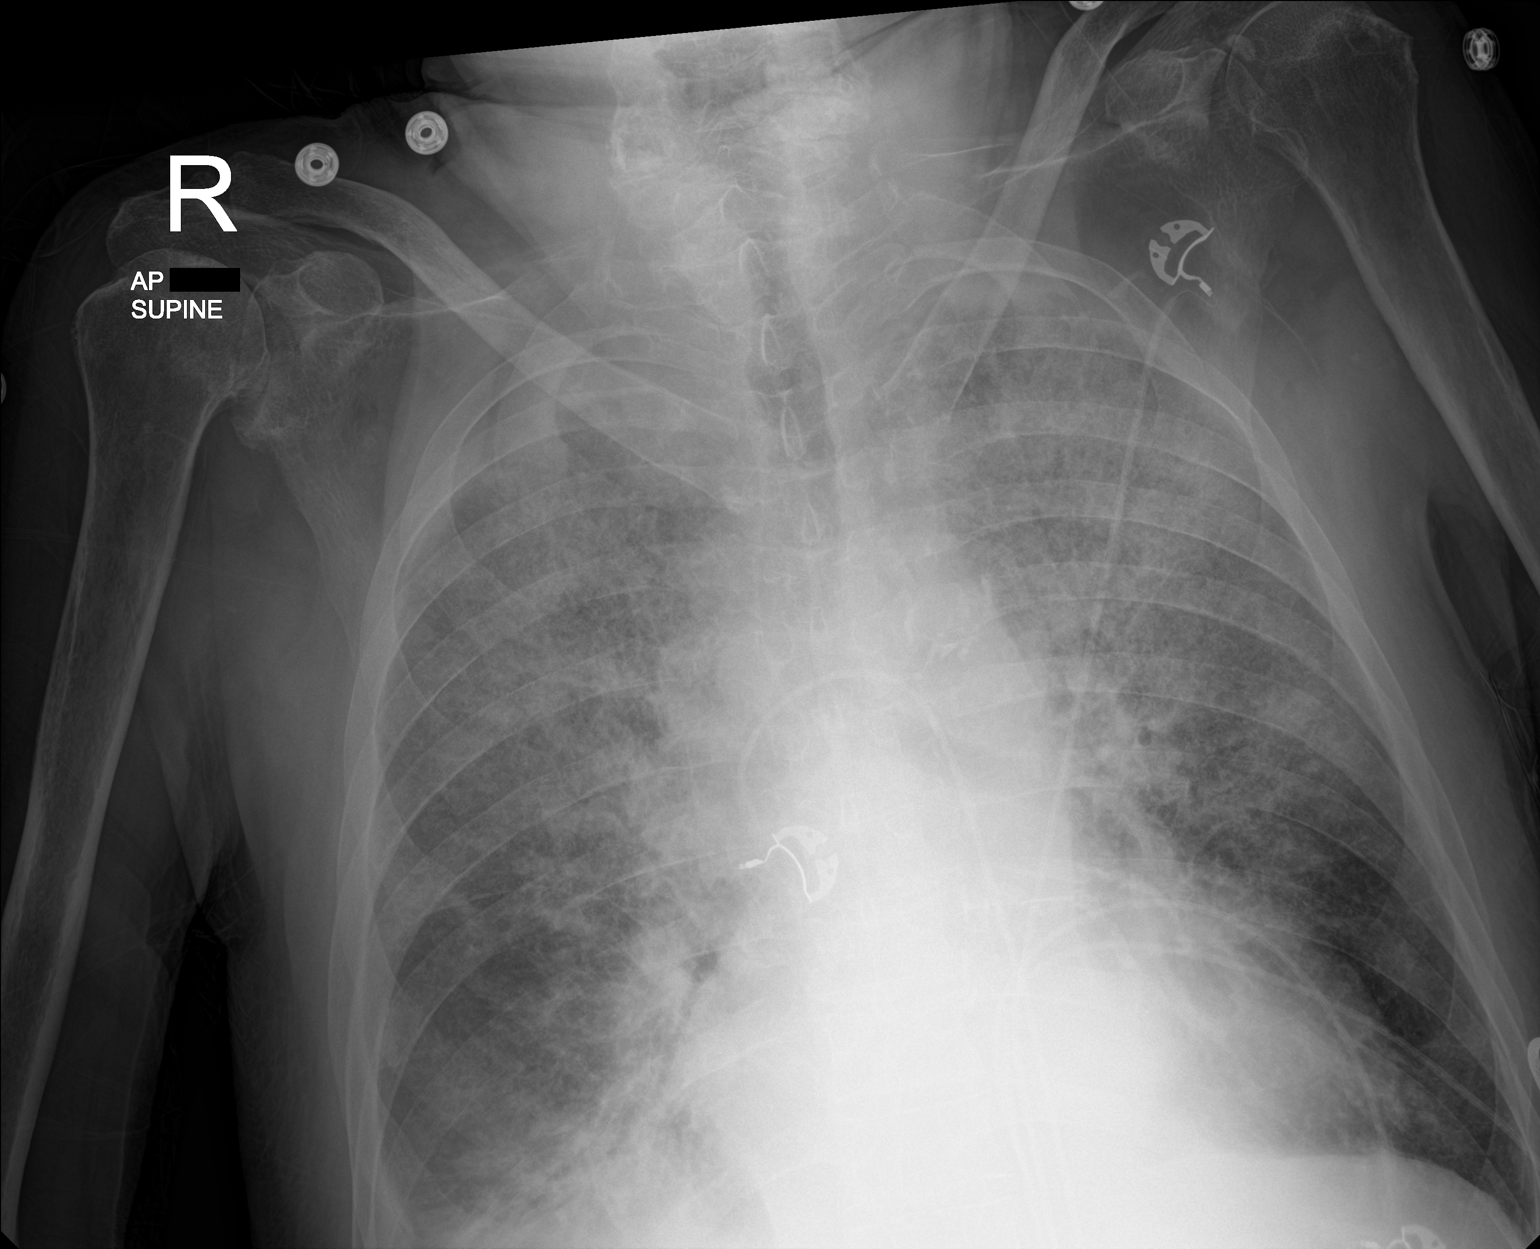

[chest ap (2 of 2)]
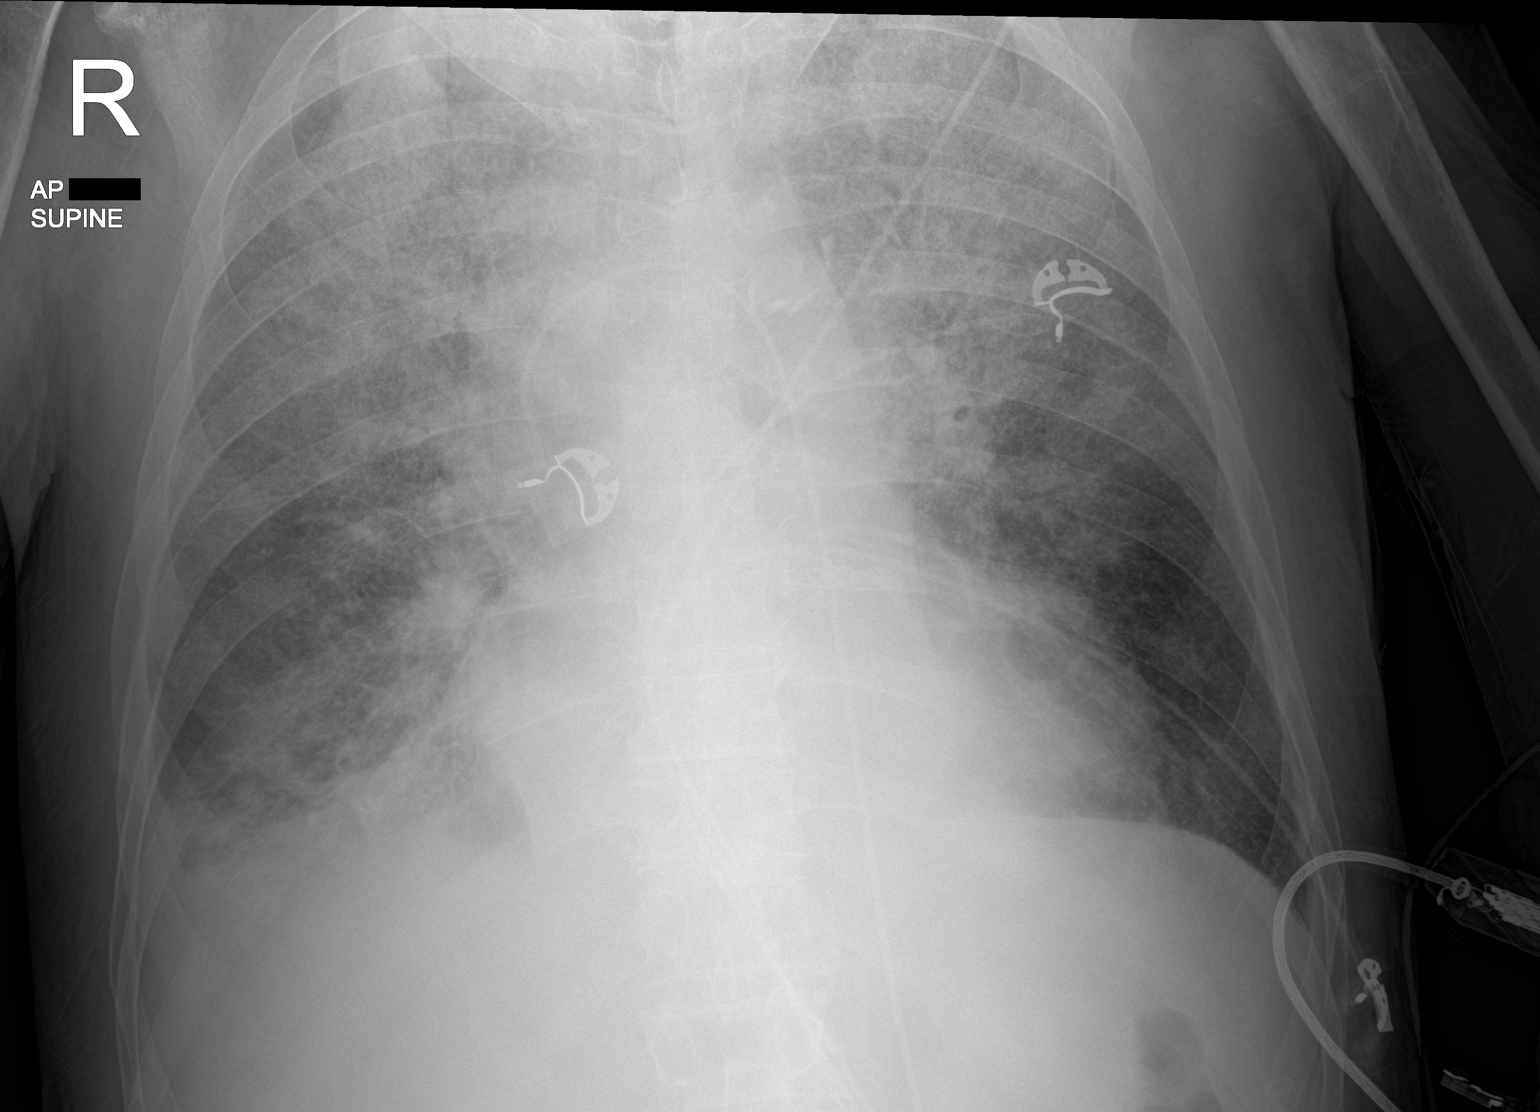

[2 of 2 positions shown; findings below may reference images not displayed]

FINDINGS: Unchanged cardiomediastinal silhouette. Similar bilateral airspace
opacities, severe in the mid to upper lungs and slightly increased
in the right lower lung. Small right pleural effusion. No visible
pneumothorax. No acute osseous abnormality.
IMPRESSION: Persistent severe bilateral airspace disease, most prominent the mid
to upper lungs with slight increase in the right lung base. Small
right pleural effusion.

## 2022-04-15 MED ORDER — MORPHINE SULFATE (PF) 2 MG/ML IV SOLN
1.0000 mg | INTRAVENOUS | Status: DC | PRN
  Administered 2022-04-15 – 2022-04-16 (×2): 1 mg via INTRAVENOUS
  Filled 2022-04-15 (×2): qty 1

## 2022-04-15 MED ORDER — MORPHINE SULFATE (PF) 2 MG/ML IV SOLN
2.0000 mg | INTRAVENOUS | Status: AC
  Administered 2022-04-15: 2 mg via INTRAVENOUS
  Filled 2022-04-15: qty 1

## 2022-04-15 MED ORDER — MORPHINE SULFATE (PF) 2 MG/ML IV SOLN
1.0000 mg | INTRAVENOUS | Status: DC | PRN
  Administered 2022-04-15 (×3): 1 mg via INTRAVENOUS
  Filled 2022-04-15 (×3): qty 1

## 2022-04-15 MED ORDER — LORAZEPAM 2 MG/ML IJ SOLN
1.0000 mg | INTRAMUSCULAR | Status: DC | PRN
  Administered 2022-04-15 – 2022-04-17 (×3): 1 mg via INTRAVENOUS
  Filled 2022-04-15 (×5): qty 1

## 2022-04-15 MED ORDER — FUROSEMIDE 10 MG/ML IJ SOLN: 40.0000 mg | Freq: Once | INTRAMUSCULAR | Status: DC

## 2022-04-15 MED ORDER — MORPHINE SULFATE (PF) 2 MG/ML IV SOLN
1.0000 mg | Freq: Four times a day (QID) | INTRAVENOUS | Status: DC | PRN
  Administered 2022-04-15: 1 mg via INTRAVENOUS
  Filled 2022-04-15: qty 1

## 2022-04-15 MED ORDER — POTASSIUM CHLORIDE 10 MEQ/100ML IV SOLN: 10.0000 meq | Freq: Once | INTRAVENOUS | Status: DC

## 2022-04-15 MED ORDER — MORPHINE 100MG IN NS 100ML (1MG/ML) PREMIX INFUSION
2.0000 mg/h | INTRAVENOUS | Status: DC
  Administered 2022-04-15 – 2022-04-16 (×2): 1 mg/h via INTRAVENOUS
  Filled 2022-04-15 (×3): qty 100

## 2022-04-15 NOTE — Progress Notes (Signed)
Patient is increasingly agitated and restless, not solved by his PRN medications. HR is 130 sustaining in A-Fib, 02 is 90% on 15L NRB while patient is cooperative. He has been intermittently removing his NRB, saturations go down to 85% with increased WOB.  ?Dr. Darrick Meigs notified, who plans to call the family for [place]. ?

## 2022-04-15 NOTE — Progress Notes (Signed)
Comfort care orders received. Patient removed from NRB and placed on HFNC at 15L, he is very tachypnea and labored breathing. restraints discontinued, cardiac monitoring discontinued. PRNs given as ordered.  ?Placed safety mitts on patient due to the restlessness until he is comfortably sleeping or not attempting to remove IV and external urinary catheter.  ?

## 2022-04-15 NOTE — Progress Notes (Addendum)
Called and discussed with patient's son Wyman Meschke, discussed patient worsening respiratory status and mental status with chest x-ray showing persistent pneumonia.  Explained to him that chances of patient's recovery are very very low.  He is extremely poor prognosis.  Will likely not survive this hospitalization.  If stable over the next 24 hours, consider residential hospice ?Son agrees to keep him comfortable. ?Will change to comfort measures ?Discontinue IV antibiotics and other medications ?Continue morphine 1 mg every 2 hours as needed ? ?

## 2022-04-15 NOTE — Progress Notes (Signed)
SLP Cancellation Note ? ?Patient Details ?Name: Devin Garza ?MRN: [id number] ?DOB: 1941-06-20 ? ? ?Cancelled treatment:       Reason Eval/Treat Not Completed: Other (comment);Medical issues which prohibited therapy (note events of last evening with pt likely aspirating, he is also likely aspirating secretions, will hold therapy, agree with strict npo and frequent oral care) ? ? ?Macario Golds ?04/15/2022, 8:17 AM ?Kathleen Lime, MS [facility] SLP ?Acute Rehab Services ?Office [phone] ?Pager [phone] ? ?

## 2022-04-15 NOTE — Progress Notes (Signed)
Upon AM assessment, patient was found to be restless, agitated, moaning. He received '1mg'$  Morphine IV, '1mg'$  Ativan IV, 650 Tylenol via RE. Patient too agitated for oral suction, although he does have rhonchi. Mr. Devin Garza has been repositioned, turned to left side, he has bilateral wrist restraints. He is on NRB at 15L due to mouth breathing, saturations are 100%. Dr. Darrick Meigs notified of events as above this AM.  ?

## 2022-04-15 NOTE — Progress Notes (Signed)
Morphine '1mg'$  was given as ordered. Patient is sleeping. RR= 33, HR= 90. Continue to monitor patient closely. ?

## 2022-04-15 NOTE — Progress Notes (Signed)
[facility] ? ?PROGRESS NOTE ? ?Devin Garza TKW:[id number] DOB: [birth year] DOA: 04/10/2022 ?PCP: Isaac Bliss, Rayford Halsted, MD ? ? ?Brief HPI:   ? ?81 year old male with medical history of alcoholism, essential hypertension, GERD, hyperlipidemia, peripheral vascular disease, basal cell carcinoma, anemia of chronic disease presented with altered mental status.  Patient has dementia at baseline.  Has been confused over past few days.  Chest x-ray showed increased bibasilar infiltrates, which may be due to pneumonia or edema. ?Patient started on IV antibiotics ? ?Subjective  ? ?Patient seen and examined, somnolent, on nonrebreather mask.  Last night patient became restless with respiratory distress and thick secretions.  Rapid response was called.  Symptoms were felt to be due to possible aspiration ? ? Assessment/Plan:  ? ? ?Sepsis due to pneumonia ?-Patient admitted for community-acquired pneumonia ?-Started on vancomycin and cefepime ?-Vancomycin was discontinued, continue cefepime  ? ?Acute hypoxemic respiratory failure ?-Likely due to aspiration ?-Started on nonrebreather mask ?-Patient is currently n.p.o. ?-We will obtain chest x-ray ? ?Hypersomnolence ?-Developed after Depakote was changed to 500 mg p.o. 3 times daily ?-We will cut down the Depakote back to 250 mg p.o. 3 times daily which she was taking at home ?-Depakote dose was increased due to worsening agitation ? ?Dysphagia ?-Patient is severe aspiration risk due to altered mental status ?-Currently n.p.o. except for meds with nectar thick liquid ? ? ?Community-acquired pneumonia ?-Started on antibiotics as above ?-We will complete 5 days of cefepime ? ?Acute metabolic encephalopathy ?-Patient has underlying dementia ?-Has behavior disturbance, no improvement with Zyprexa, Ativan and Haldol ?-Improved after increasing Depakote to 500 mg 3 times daily; however patient became hypersomnolent, will change Depakote dose to 250 mg p.o. 3 times  daily ?-Continue olanzapine 5 mg p.o. 3 times daily ? ?Essential hypertension ?-Blood pressure stable ?-Continue metoprolol 25 mg p.o. twice daily ? ?Normocytic anemia ?-Hemoglobin dropped to 7.4 today yesterday ?-Today hemoglobin is up to 8.5 ? ?Goals of care ?-Called and discussed with patient's son and daughter-in-law on phone, explained to them that patient has inadequate p.o. intake.  Discussed artificial nutrition with PEG tube, explained the risks and the benefits.  At this time they do not want PEG tube nutrition.  We will consult palliative care for further clarification of goals of care.  If patient does not improve in the next few days, he may be a candidate for residential hospice ? ?Medications ? ?  ? chlorhexidine  15 mL Mouth Rinse BID  ? enoxaparin (LOVENOX) injection  40 mg Subcutaneous Q24H  ? escitalopram  10 mg Oral Daily  ? famotidine  20 mg Oral BID  ? ferrous sulfate  324 mg Oral Q breakfast  ? folic acid  1 mg Oral Daily  ? liver oil-zinc oxide   Topical TID  ? loratadine  10 mg Oral Daily  ? magnesium oxide  400 mg Oral BID  ? mouth rinse  15 mL Mouth Rinse q12n4p  ? metoprolol tartrate  25 mg Oral BID  ? multivitamin with minerals  1 tablet Oral Daily  ? OLANZapine  5 mg Oral TID  ? tamsulosin  0.4 mg Oral QPC supper  ? thiamine  100 mg Oral Daily  ? traZODone  100 mg Oral QHS  ? ? ? Data Reviewed:  ? ?CBG: ? ?Recent Labs  ?Lab 04/01/2022 ?1728  ?GLUCAP 99  ? ? ?SpO2: 100 % ?O2 Flow Rate (L/min): 15 L/min  ? ? ?Vitals:  ? 04/15/22 0429 04/15/22 0555 04/15/22  5027 04/15/22 1216  ?BP:   138/89 (!) 125/55  ?Pulse:  (!) 105 77 74  ?Resp:  (!) 26 (!) 24 16  ?Temp:   97.6 ?F (36.4 ?C)   ?TempSrc:   Oral   ?SpO2: 96% 100% 100% 100%  ?Weight:      ?Height:      ? ? ? ? ?Data Reviewed: ? ?Basic Metabolic Panel: ?Recent Labs  ?Lab 04/10/2022 ?1528 04/09/2022 ?1636 04/12/22 ?0518 04/15/22 ?1049  ?NA 136 137 136 142  ?K 3.6 3.6 3.7 3.4*  ?CL 106 102 108 109  ?CO2 22  --  21* 22  ?GLUCOSE 108* 111* 94 122*   ?BUN '17 14 17 21  '$ ?CREATININE 0.81 0.80 0.73 0.77  ?CALCIUM 8.5*  --  8.4* 8.2*  ? ? ?CBC: ?Recent Labs  ?Lab 04/22/2022 ?1528 04/25/2022 ?1636 04/12/22 ?0518 04/13/22 ?7412 04/15/22 ?1209  ?WBC 12.4*  --  9.0 8.4 7.3  ?NEUTROABS 10.1*  --   --   --   --   ?HGB 7.8* 8.5* 7.4* 8.5* 8.3*  ?HCT 25.3* 25.0* 23.4* 27.6* 27.3*  ?MCV 83.5  --  81.8 83.4 84.0  ?PLT 303  --  255 285 230  ? ? ?LFT ?Recent Labs  ?Lab 04/25/2022 ?1528 04/12/22 ?0518  ?AST 15 13*  ?ALT 8 7  ?ALKPHOS 38 36*  ?BILITOT 0.3 0.6  ?PROT 6.0* 5.4*  ?ALBUMIN 2.9* 2.4*  ? ?  ?Antibiotics: ?Anti-infectives (From admission, onward)  ? ? Start     Dose/Rate Route Frequency Ordered Stop  ? 04/12/22 1800  vancomycin (VANCOREADY) IVPB 1500 mg/300 mL  Status:  Discontinued       ? 1,500 mg ?150 mL/hr over 120 Minutes Intravenous Every 24 hours 04/19/2022 2218 04/12/22 1555  ? 04/12/22 0200  ceFEPIme (MAXIPIME) 2 g in sodium chloride 0.9 % 100 mL IVPB       ? 2 g ?200 mL/hr over 30 Minutes Intravenous Every 8 hours 03/28/2022 1910 04/15/22 2359  ? 04/12/22 0015  vancomycin (VANCOCIN) IVPB 1000 mg/200 mL premix  Status:  Discontinued       ? 1,000 mg ?200 mL/hr over 60 Minutes Intravenous  Once 04/25/2022 2328 04/25/2022 2338  ? 04/12/22 0015  ceFEPIme (MAXIPIME) 2 g in sodium chloride 0.9 % 100 mL IVPB  Status:  Discontinued       ? 2 g ?200 mL/hr over 30 Minutes Intravenous  Once 04/20/2022 2328 04/21/2022 2338  ? 04/22/2022 1915  vancomycin (VANCOREADY) IVPB 1500 mg/300 mL  Status:  Discontinued       ? 1,500 mg ?150 mL/hr over 120 Minutes Intravenous Every 24 hours 04/12/2022 1910 04/10/2022 2217  ? 04/16/2022 1745  ceFEPIme (MAXIPIME) 2 g in sodium chloride 0.9 % 100 mL IVPB       ? 2 g ?200 mL/hr over 30 Minutes Intravenous  Once 04/14/2022 1739 04/23/2022 1830  ? 04/10/2022 1745  metroNIDAZOLE (FLAGYL) IVPB 500 mg       ? 500 mg ?100 mL/hr over 60 Minutes Intravenous  Once 04/18/2022 1739 04/10/2022 1906  ? 04/22/2022 1745  vancomycin (VANCOCIN) IVPB 1000 mg/200 mL premix  Status:   Discontinued       ? 1,000 mg ?200 mL/hr over 60 Minutes Intravenous  Once 04/19/2022 1739 04/18/2022 1741  ? 04/25/2022 1745  vancomycin (VANCOREADY) IVPB 1500 mg/300 mL       ? 1,500 mg ?150 mL/hr over 120 Minutes Intravenous  Once 04/23/2022 1741 03/30/2022 2149  ? ?  ? ? ? ?  DVT prophylaxis: Lovenox ? ?Code Status: Full code ? ?Family Communication: No family at bedside ? ? ?CONSULTS  ? ? ?Objective  ? ? ?Physical Examination: ? ? ?General-somnolent ?Heart-S1-S2, regular, no murmur auscultated ?Lungs-clear to auscultation bilaterally, no wheezing or crackles auscultated ?Abdomen-soft, nontender, no organomegaly ?Extremities-no edema in the lower extremities ?Neuro-somnolent, arousable to sternal rub ? ?Status is: Inpatient: Altered mental status ? ? ? ?  ? ? ? ?Devin Garza ?  ?[facility] ?If 7PM-7AM, please contact night-coverage at [email or web address], ?Office  [phone] ? ? ?04/15/2022, 2:21 PM  LOS: 4 days  ? ? ? ? ? ? ? ? ? ? ?  ?

## 2022-04-15 NOTE — Progress Notes (Signed)
Patient still restless with prn morphine, will start morphine gtt '1mg'$ /hr titrate to 3 mg/hr for comfort. ?

## 2022-04-16 ENCOUNTER — Encounter (HOSPITAL_COMMUNITY): Payer: Self-pay | Admitting: Internal Medicine

## 2022-04-16 DIAGNOSIS — Z7189 Other specified counseling: Secondary | ICD-10-CM | POA: Diagnosis not present

## 2022-04-16 DIAGNOSIS — Z515 Encounter for palliative care: Secondary | ICD-10-CM

## 2022-04-16 DIAGNOSIS — J189 Pneumonia, unspecified organism: Secondary | ICD-10-CM | POA: Diagnosis not present

## 2022-04-16 DIAGNOSIS — Y95 Nosocomial condition: Secondary | ICD-10-CM

## 2022-04-16 DIAGNOSIS — A419 Sepsis, unspecified organism: Secondary | ICD-10-CM

## 2022-04-16 DIAGNOSIS — N179 Acute kidney failure, unspecified: Secondary | ICD-10-CM

## 2022-04-16 DIAGNOSIS — G9341 Metabolic encephalopathy: Secondary | ICD-10-CM | POA: Diagnosis not present

## 2022-04-16 LAB — BASIC METABOLIC PANEL
Anion gap: 11 (ref 5–15)
BUN: 22 mg/dL (ref 8–23)
CO2: 24 mmol/L (ref 22–32)
Calcium: 8.9 mg/dL (ref 8.9–10.3)
Chloride: 110 mmol/L (ref 98–111)
Creatinine, Ser: 0.65 mg/dL (ref 0.61–1.24)
GFR, Estimated: >60 mL/min (ref >60)
Glucose, Bld: 99 mg/dL (ref 70–99)
Potassium: 3.6 mmol/L (ref 3.5–5.1)
Sodium: 145 mmol/L (ref 135–145)

## 2022-04-16 LAB — CULTURE, BLOOD (ROUTINE X 2): Culture: NO GROWTH

## 2022-04-16 MED ORDER — HALOPERIDOL LACTATE 5 MG/ML IJ SOLN: 0.5000 mg | INTRAMUSCULAR | Status: DC | PRN

## 2022-04-16 MED ORDER — HALOPERIDOL LACTATE 2 MG/ML PO CONC
0.5000 mg | ORAL | Status: DC | PRN
  Filled 2022-04-16: qty 0.3

## 2022-04-16 MED ORDER — LORAZEPAM 2 MG/ML IJ SOLN
1.0000 mg | Freq: Once | INTRAMUSCULAR | Status: AC
  Administered 2022-04-16: 1 mg via INTRAVENOUS

## 2022-04-16 MED ORDER — GLYCOPYRROLATE 0.2 MG/ML IJ SOLN: 0.2000 mg | INTRAMUSCULAR | Status: DC | PRN

## 2022-04-16 MED ORDER — POLYVINYL ALCOHOL 1.4 % OP SOLN
1.0000 [drp] | Freq: Four times a day (QID) | OPHTHALMIC | Status: DC | PRN
  Filled 2022-04-16: qty 15

## 2022-04-16 MED ORDER — BIOTENE DRY MOUTH MT LIQD: 15.0000 mL | OROMUCOSAL | Status: DC | PRN

## 2022-04-16 MED ORDER — HALOPERIDOL 0.5 MG PO TABS
0.5000 mg | ORAL_TABLET | ORAL | Status: DC | PRN
  Filled 2022-04-16: qty 1

## 2022-04-16 MED ORDER — MORPHINE BOLUS VIA INFUSION
2.0000 mg | INTRAVENOUS | Status: DC | PRN
  Filled 2022-04-16: qty 2

## 2022-04-16 MED ORDER — GLYCOPYRROLATE 0.2 MG/ML IJ SOLN
0.2000 mg | INTRAMUSCULAR | Status: DC | PRN
  Administered 2022-04-16: 0.2 mg via INTRAVENOUS
  Filled 2022-04-16: qty 1

## 2022-04-16 MED ORDER — GLYCOPYRROLATE 1 MG PO TABS
1.0000 mg | ORAL_TABLET | ORAL | Status: DC | PRN
  Filled 2022-04-16: qty 1

## 2022-04-16 MED ORDER — GLYCOPYRROLATE 0.2 MG/ML IJ SOLN
0.2000 mg | INTRAMUSCULAR | Status: DC
  Administered 2022-04-16 – 2022-04-17 (×3): 0.2 mg via INTRAVENOUS
  Filled 2022-04-16 (×3): qty 1

## 2022-04-16 NOTE — Assessment & Plan Note (Addendum)
Concern for aspiration (4/19 PM) ? ?

## 2022-04-16 NOTE — Progress Notes (Signed)
?PROGRESS NOTE ? ? ? Devin Garza  WUJ:[id number] DOB: May 08, 1941 DOA: 04/15/2022 ?PCP: Devin Garza, Devin Halsted, MD  ?Chief Complaint  ?Patient presents with  ? Altered Mental Status  ? ? ?Brief Narrative:  ?81 year old male with medical history of alcoholism, essential hypertension, GERD, hyperlipidemia, peripheral vascular disease, basal cell carcinoma, anemia of chronic disease presented with altered mental status.  He was treated with antibiotics for pneumonia.  He developed worsening resp status yesterday afternoon and his care was discussed with son Devin Garza by Dr. Darrick Meigs.  At that time Devin Garza decision was made to transition to comfort measures.  Expect in hospital death. ? ?See below for additional details  ? ? ?Assessment & Plan: ?  ?Principal Problem: ?  CAP (community acquired pneumonia) ?Active Problems: ?  History of alcohol abuse ?  Comfort measures only status ?  Sepsis ([place]) ?  Acute respiratory failure with hypoxia ([place]) ?  Acute metabolic encephalopathy ?  Dysphagia ?  Essential hypertension ?  Anemia, unspecified ?  Mixed hyperlipidemia ?  GERD without esophagitis ?  AKI (acute kidney injury) ([place]) ? ? ?Assessment and Plan: ?Comfort measures only status ?Discussed with Devin Garza by Dr. Darrick Meigs yesterday ?He's been transitioned to comfort measures ?Expect in hospital death ?Likely hours to days ?Appreciate palliative care assistance ? ?Sepsis ([place]) ?Sepsis due to pneumonia ?abx discontinued at this point with CMO ? ?Acute respiratory failure with hypoxia ([place]) ?Concern for aspiration ? ? ?Acute metabolic encephalopathy ?Previously described as hypersomnolent, thought related to depakote? ? ? ? ? ? ? ? ? ?DVT prophylaxis: comfort measures ?Code Status: dnr ?Family Communication: son, Devin Garza ?Disposition:  ? ?Status is: Inpatient ?Remains inpatient appropriate because: comfort measures, in hospital death expected ?  ?Consultants:  ?palliative ? ?Procedures:  ?none ? ?Antimicrobials:  ?Anti-infectives (From admission,  onward)  ? ? Start     Dose/Rate Route Frequency Ordered Stop  ? 04/12/22 1800  vancomycin (VANCOREADY) IVPB 1500 mg/300 mL  Status:  Discontinued       ? 1,500 mg ?150 mL/hr over 120 Minutes Intravenous Every 24 hours 04/14/2022 2218 04/12/22 1555  ? 04/12/22 0200  ceFEPIme (MAXIPIME) 2 g in sodium chloride 0.9 % 100 mL IVPB  Status:  Discontinued       ? 2 g ?200 mL/hr over 30 Minutes Intravenous Every 8 hours 04/14/2022 1910 04/15/22 1622  ? 04/12/22 0015  vancomycin (VANCOCIN) IVPB 1000 mg/200 mL premix  Status:  Discontinued       ? 1,000 mg ?200 mL/hr over 60 Minutes Intravenous  Once 03/31/2022 2328 03/28/2022 2338  ? 04/12/22 0015  ceFEPIme (MAXIPIME) 2 g in sodium chloride 0.9 % 100 mL IVPB  Status:  Discontinued       ? 2 g ?200 mL/hr over 30 Minutes Intravenous  Once 04/07/2022 2328 04/06/2022 2338  ? 04/02/2022 1915  vancomycin (VANCOREADY) IVPB 1500 mg/300 mL  Status:  Discontinued       ? 1,500 mg ?150 mL/hr over 120 Minutes Intravenous Every 24 hours 04/13/2022 1910 03/31/2022 2217  ? 04/12/2022 1745  ceFEPIme (MAXIPIME) 2 g in sodium chloride 0.9 % 100 mL IVPB       ? 2 g ?200 mL/hr over 30 Minutes Intravenous  Once 04/08/2022 1739 04/24/2022 1830  ? 03/28/2022 1745  metroNIDAZOLE (FLAGYL) IVPB 500 mg       ? 500 mg ?100 mL/hr over 60 Minutes Intravenous  Once 04/12/2022 1739 03/27/2022 1906  ? 04/10/2022 1745  vancomycin (VANCOCIN) IVPB 1000 mg/200 mL  premix  Status:  Discontinued       ? 1,000 mg ?200 mL/hr over 60 Minutes Intravenous  Once 04/12/2022 1739 04/07/2022 1741  ? 04/15/2022 1745  vancomycin (VANCOREADY) IVPB 1500 mg/300 mL       ? 1,500 mg ?150 mL/hr over 120 Minutes Intravenous  Once 04/19/2022 1741 04/25/2022 2149  ? ?  ? ? ?Subjective: ?Occasional moaning ? ?Objective: ?Vitals:  ? 04/15/22 1216 04/15/22 1532 04/16/22 0555 04/16/22 1300  ?BP: (!) 125/55 (!) 145/97 (!) 117/46 (!) 120/50  ?Pulse: 74 97 (!) 116 (!) 105  ?Resp: 16 (!) 24  (!) 24  ?Temp:  97.6 ?F (36.4 ?C) 98 ?F (36.7 ?C) 97.8 ?F (36.6 ?C)  ?TempSrc:  Oral  Axillary Oral  ?SpO2: 100% 96% 95% 97%  ?Weight:      ?Height:      ? ? ?Intake/Output Summary (Last 24 hours) at 04/16/2022 2147 ?Last data filed at 04/16/2022 1614 ?Gross per 24 hour  ?Intake 47.2 ml  ?Output 0 ml  ?Net 47.2 ml  ? ?Filed Weights  ? 04/12/22 0347  ?Weight: 73.2 kg  ? ? ?Examination: ? ?General exam: initially appeared uncomfortable (improved after comfort meds) ?Respiratory system: labored respirations ?Central nervous system: lethargic, mildly agitated at times when I saw him/moaning (meds adjusted) ?Extremities: no LEE ? ? ? ?Data Reviewed: I have personally reviewed following labs and imaging studies ? ?CBC: ?Recent Labs  ?Lab 04/08/2022 ?1528 04/19/2022 ?1636 04/12/22 ?0518 04/13/22 ?4854 04/15/22 ?1209  ?WBC 12.4*  --  9.0 8.4 7.3  ?NEUTROABS 10.1*  --   --   --   --   ?HGB 7.8* 8.5* 7.4* 8.5* 8.3*  ?HCT 25.3* 25.0* 23.4* 27.6* 27.3*  ?MCV 83.5  --  81.8 83.4 84.0  ?PLT 303  --  255 285 230  ? ? ?Basic Metabolic Panel: ?Recent Labs  ?Lab 04/20/2022 ?1528 03/28/2022 ?1636 04/12/22 ?0518 04/15/22 ?1049 04/16/22 ?0532  ?NA 136 137 136 142 145  ?K 3.6 3.6 3.7 3.4* 3.6  ?CL 106 102 108 109 110  ?CO2 22  --  21* 22 24  ?GLUCOSE 108* 111* 94 122* 99  ?BUN '17 14 17 21 22  '$ ?CREATININE 0.81 0.80 0.73 0.77 0.65  ?CALCIUM 8.5*  --  8.4* 8.2* 8.9  ? ? ?GFR: ?Estimated Creatinine Clearance: 70.1 mL/min (by C-G formula based on SCr of 0.65 mg/dL). ? ?Liver Function Tests: ?Recent Labs  ?Lab 04/09/2022 ?1528 04/12/22 ?0518  ?AST 15 13*  ?ALT 8 7  ?ALKPHOS 38 36*  ?BILITOT 0.3 0.6  ?PROT 6.0* 5.4*  ?ALBUMIN 2.9* 2.4*  ? ? ?CBG: ?Recent Labs  ?Lab 04/14/2022 ?1728  ?GLUCAP 99  ? ? ? ?Recent Results (from the past 240 hour(s))  ?Culture, blood (Routine X 2) w Reflex to ID Panel     Status: None  ? Collection Time: 04/07/2022  4:40 PM  ? Specimen: BLOOD  ?Result Value Ref Range Status  ? Specimen Description   Final  ?  BLOOD SITE NOT SPECIFIED ?Performed at [facility], [place] [address] [place], [place]  [address] ?  ? Special Requests   Final  ?  BOTTLES DRAWN AEROBIC AND ANAEROBIC Blood Culture results may not be optimal due to an excessive volume of blood received in culture bottles ?Performed at [facility], [place] [address] [place], [place] [address] ?  ? Culture   Final  ?  NO GROWTH 5 DAYS ?Performed at [facility],  [address], [place], [place] [address] ?  ? Report Status 04/16/2022 FINAL  Final  ?Resp Panel by RT-PCR (Flu Kinnedy Mongiello&B, Covid) Nasopharyngeal Swab     Status: None  ? Collection Time: 04/03/2022  5:35 PM  ? Specimen: Nasopharyngeal Swab; Nasopharyngeal(NP) swabs in vial transport medium  ?Result Value Ref Range Status  ? SARS Coronavirus 2 by RT PCR NEGATIVE NEGATIVE Final  ?  Comment: (NOTE) ?SARS-CoV-2 target nucleic acids are NOT DETECTED. ? ?The SARS-CoV-2 RNA is generally detectable in upper respiratory ?specimens during the acute phase of infection. The lowest ?concentration of SARS-CoV-2 viral copies this assay can detect is ?138 copies/mL. Uvaldo Rybacki negative result does not preclude SARS-Cov-2 ?infection and should not be used as the sole basis for treatment or ?other patient management decisions. Vikrant Pryce negative result may occur with  ?improper specimen collection/handling, submission of specimen other ?than nasopharyngeal swab, presence of viral mutation(s) within the ?areas targeted by this assay, and inadequate number of viral ?copies(<138 copies/mL). Anayah Arvanitis negative result must be combined with ?clinical observations, patient history, and epidemiological ?information. The expected result is Negative. ? ?Fact Sheet for Patients:  ?EntrepreneurPulse.com.au ? ?Fact Sheet for Healthcare Providers:  ?IncredibleEmployment.be ? ?This test is no t yet approved or cleared by the [place] FDA and  ?has been authorized for detection and/or diagnosis of SARS-CoV-2 by ?FDA under an Emergency Use Authorization (EUA). This EUA will remain  ?in effect (meaning  this test can be used) for the duration of the ?COVID-19 declaration under Section 564(b)(1) of the Act, 21 ?U.S.C.section 360bbb-3(b)(1), unless the authorization is terminated  ?or revoked sooner.  ? ? ?

## 2022-04-16 NOTE — Hospital Course (Addendum)
81 year old male with medical history of alcoholism, essential hypertension, GERD, hyperlipidemia, peripheral vascular disease, basal cell carcinoma, anemia of chronic disease presented with altered mental status.  He was treated with antibiotics for pneumonia.  He developed worsening resp status and mental status on 4/20.  Devin Garza decision was made for Devin Garza transition to comfort measures at that time.  He passed away on 02-May-2022.  Called and updated Devin Garza.  ? ?See below and previous notes for additional details ?

## 2022-04-16 NOTE — Progress Notes (Signed)
SLP Cancellation Note ? ?Patient Details ?Name: Devin Garza ?MRN: [id number] ?DOB: [birth year] ? ? ?Cancelled treatment:       Reason Eval/Treat Not Completed: Other (comment);Medical issues which prohibited therapy (pt now comfort care, will sign off) ? ?Kathleen Lime, MS CCC SLP ?Acute Rehab Services ?Office [phone] ?Pager [phone] ? ?Devin Garza ?04/16/2022, 8:17 AM ?

## 2022-04-16 NOTE — Assessment & Plan Note (Signed)
Previously described as hypersomnolent, thought related to depakote? ? ?

## 2022-04-16 NOTE — Assessment & Plan Note (Addendum)
He was transitioned to comfort measures after worsening resp status and mental status on 4/20.  ?

## 2022-04-16 NOTE — Assessment & Plan Note (Addendum)
Admitted with sepsis due to pneumonia ?

## 2022-04-16 NOTE — Consult Note (Signed)
? ?Palliative Care Consult Note  ?                                ?Date: 04/16/2022  ? ?Patient Name: Devin Garza  ?DOB: 10/14/1941  MRN: [id number]  Age / Sex: 81 y.o., male  ?PCP: Isaac Bliss, Rayford Halsted, MD ?Referring Physician: Elodia Florence., * ? ?Reason for Consultation: Establishing goals of care and Terminal Care ? ?HPI/Patient Profile: 81 y.o. male  with past medical history of alcoholism, essential hypertension, GERD, hyperlipidemia, peripheral vascular disease, basal cell carcinoma, anemia of chronic disease presented with altered mental status.  Patient has dementia at baseline.  Has been confused over past few days.  Chest x-ray showed increased bibasilar infiltrates, which may be due to pneumonia or edema. ?Patient started on IV antibiotics. ? ?PMT was consulted for [facility] conversation. ? ?Past Medical History:  ?Diagnosis Date  ? Actinic keratosis   ? Anemia   ? Basal cell adenocarcinoma 09/03/2008  ? Qualifier: Diagnosis of  By: Copland MD, Frederico Hamman    Overview:  Overview:  Qualifier: Diagnosis of  By: Lorelei Pont MD, Frederico Hamman   ? Basal cell carcinoma 11/06/2008  ? Right mastoid/neck.   ? Basal cell carcinoma 11/13/2008  ? Left lateral nose medial infraorbital ~2cm lat. to med. canthus. Excised 01/08/2009, margins free.  ? Basal cell carcinoma 10/09/2009  ? Right upper back, paraspinal inf. to base of neck. Excised 12/03/2009  ? Basal cell carcinoma 06/11/2013  ? Left lateral forehead. Excised 07/18/2013, margins free.  ? Basal cell carcinoma 07/30/2015  ? Right nasal ala. Nodular pattern.  ? Basal cell carcinoma 07/30/2015  ? Right mid to sup. helix. Nodular.  ? Basal cell carcinoma 08/25/2016  ? Right mid to sup. helix. Superficial with focal infiltration. Excised 09/28/2016, margins free.  ? Basal cell carcinoma 03/16/2017  ? Right mid to sup. helix. Mixed pattern, ulcerated  ? Basal cell carcinoma 05/10/2018  ? Right nasal tip. BCC with focal  sclerosis.  ? Basal cell carcinoma of lip 2007  ? multiple sites  ? Blood transfusion without reported diagnosis   ? ED (erectile dysfunction)   ? GERD (gastroesophageal reflux disease)   ? HLD (hyperlipidemia)   ? Hx of squamous cell carcinoma of skin 08/25/2016  ? L superior pectoral  ? Hypertension   ? Osteoarthritis   ? Peripheral vascular disease ([place])   ? Occlusion and Stenosis of Carotid Artery  ? Squamous cell carcinoma of skin 08/25/2016  ? Left superior pectoral. KA pattern. EDC.  ? Tobacco abuse   ? Tubular adenoma of colon 2005  ? Wears dentures   ? full upper and lower  ? Wernicke's disease   ? ? ?Subjective:  ? ?This NP Walden Field reviewed medical records, received report from team, assessed the patient and then meet at the patient's bedside to discuss diagnosis, prognosis, GOC, EOL wishes disposition and options. ? ?I met with the patient's nurse.  The patient is unable to meaningfully interact and no family is at bedside.  Per nursing the patient's brother was previously there. ?  ?Concept of Palliative Care was introduced as specialized medical care for people and their families living with serious illness.  If focuses on providing relief from the symptoms and stress of a serious illness.  The goal is to improve quality of life for both the patient and the family. Values and goals of care important to patient and  family were attempted to be elicited. ? ?Created space and opportunity for patient  and family to explore thoughts and feelings regarding current medical situation ?  ?Natural trajectory and current clinical status were discussed. Questions and concerns addressed. Patient  encouraged to call with questions or concerns.   ? ?Patient/Family Understanding of Illness: ?Deferred ? ?Life Review: ?Deferred ? ?Patient Values: ?Deferred ? ?Goals: ?Deferred ? ?Today's Discussion: ?I spoke with the nurse today and read through previous notes from overnight when the patient had a significant decline.   He was made comfort care by the hospitalist after talking with the family.  They seem to be on board with this.  The nurse states that he is comfortable at this point.  Previously he was quite agitated, tachypneic.  When I saw him today he appeared comfortable, had a respiratory rate of about 10, slightly tachycardic.  Breathing was even and unlabored but he was unresponsive.  Overall I feel he would not likely survive the next 12 hours. ? ?Review of Systems  ?Unable to perform ROS: Patient unresponsive  ? ?Objective:  ? ?Primary Diagnoses: ?Present on Admission: ? Sepsis ([place]) ? History of alcohol abuse ? AKI (acute kidney injury) ([place]) ? Anemia, unspecified ? Essential hypertension ? GERD without esophagitis ? Mixed hyperlipidemia ? Acute metabolic encephalopathy ? Healthcare-associated pneumonia ? ? ?Physical Exam ?Vitals and nursing note reviewed.  ?Constitutional:   ?   General: He is not in acute distress. ?   Appearance: He is ill-appearing.  ?HENT:  ?   Head: Normocephalic and atraumatic.  ?Cardiovascular:  ?   Rate and Rhythm: Tachycardia present.  ?Pulmonary:  ?   Effort: Pulmonary effort is normal. Bradypnea present. No respiratory distress.  ?Neurological:  ?   Mental Status: He is unresponsive.  ? ? ?Vital Signs:  ?BP (!) 120/50 (BP Location: Left Arm)   Pulse (!) 105   Temp 97.8 ?F (36.6 ?C) (Oral)   Resp (!) 24   Ht 5' 8"  (1.727 m)   Wt 73.2 kg   SpO2 97%   BMI 24.54 kg/m?  ? ?Palliative Assessment/Data: 10% ? ? ? ?Advanced Care Planning:  ? ?Primary Decision Maker: ?NEXT OF KIN ? ?Code Status/Advance Care Planning: ?DNR ? ?Decisions/Changes to ACP: ?None today ? ?Assessment & Plan:  ? ?Impression: ?81 year old male admitted for likely aspiration pneumonia was had significant decline.  After discussion with the family last night with the hospitalist, the patient was made comfort care.  Morphine drip was started and other comfort medications were initiated.  He appears to be comfortable at that  at this point but he is on his Maxolon ordered dose of morphine.  I will increase morphine dose to allow more leeway for nursing staff in case he becomes more agitated tonight.  Overall prognosis is grave, would not likely survive hospitalization.  I feel that referral to residential hospice may be unnecessary at this point given his current situation.  If that does not change over the next 24 to 48 hours we can reconsider residential hospice referral. ? ?SUMMARY OF RECOMMENDATIONS   ?Continue current comfort care ?Remain DNR ?Patient and family support ?Symptom management orders as per below, slightly titrated by PMT ?PMT will continue to follow for symptom checks ? ?Symptom Management:  ?Biotin solution as needed for dry mouth, as ordered ?Robinul 0.2 mg IV every 4 hours changed to scheduled ?Haldol 0.5 mg IV every 4 hours as needed agitation ?Lorazepam 1 mg IV every 4 hours as needed  anxiety, fear ?Morphine drip changed to 2 to 5 mg/h titrate as needed for pain, dyspnea ?Morphine bolus 2 mg every 30 minutes as needed pain, dyspnea ?Polyvinyl alcohol 1 drop OU 4 times daily as needed dry eyes ? ?Prognosis:  ?Hours - Days ? ?Discharge Planning:  ?Anticipated Hospital Death  ? ?Discussed with: Medical team, nursing team ? ? ? ?Thank you for allowing [place] to participate in the care of Santino Kinsella ?PMT will continue to support holistically. ? ?Billing based on MDM: High ? ?Problems Addressed: One acute or chronic illness or injury that poses a threat to life or bodily function ? ?Amount and/or Complexity of Data: Category 3:Discussion of management or test interpretation with external physician/other qualified health care professional/appropriate source (not separately reported) ? ?Risks: Parenteral controlled substances and Decision not to resuscitate or to de-escalate care because of poor prognosis ? ? ?Signed by: ?Walden Field, NP ?Palliative Medicine Team ? ?Team Phone # [phone] (Nights/Weekends) ? 04/16/2022,  3:23 PM  ? ? ? ? ? ? ? ? ? ? ? ? ? ? ?

## 2022-04-17 LAB — CULTURE, BLOOD (ROUTINE X 2)
Culture: NO GROWTH
Special Requests: ADEQUATE

## 2022-04-17 MED ORDER — MORPHINE SULFATE (PF) 2 MG/ML IV SOLN: 1.0000 mg | INTRAVENOUS | Status: DC | PRN

## 2022-04-26 NOTE — Death Summary Note (Signed)
? ?DEATH SUMMARY  ? ?Patient Details  ?Name: Devin Garza ?MRN: [id number] ?DOB: December 13, 1941 ?[id number] Devin Beals, MD ?Admission/Discharge Information  ? ?Admit Date:  04-24-2022  ?Date of Death: Date of Death: 04-30-22  ?Time of Death: Time of Death: 1120-03-23  ?Length of Stay: 6  ? ?Principle Cause of death: community acquired pneumonia ? ?Hospital Diagnoses: ?Principal Problem: ?  CAP (community acquired pneumonia) ?Active Problems: ?  History of alcohol abuse ?  Comfort measures only status ?  Sepsis ([place]) ?  Acute respiratory failure with hypoxia ([place]) ?  Acute metabolic encephalopathy ?  Dysphagia ?  Essential hypertension ?  Anemia, unspecified ?  Mixed hyperlipidemia ?  GERD without esophagitis ?  AKI (acute kidney injury) ([place]) ? ? ?Hospital Course: ?81 year old male with medical history of alcoholism, essential hypertension, GERD, hyperlipidemia, peripheral vascular disease, basal cell carcinoma, anemia of chronic disease presented with altered mental status.  He was treated with antibiotics for pneumonia.  He developed worsening resp status and mental status on 4/20.  Devin Garza decision was made for Devin Garza transition to comfort measures at that time.  He passed away on 30-Apr-2022.  Called and updated Devin Garza.  ? ?See below and previous notes for additional details ? ?Assessment and Plan: ?Comfort measures only status ?He was transitioned to comfort measures after worsening resp status and mental status on 4/20.  ? ?Sepsis ([place]) ?Admitted with sepsis due to pneumonia ? ?Acute respiratory failure with hypoxia ([place]) ?Concern for aspiration (4/19 PM) ? ? ?Acute metabolic encephalopathy ?Previously described as hypersomnolent, thought related to depakote? ? ? ? ?  ? ?Procedures: none ? ?Consultations: palliative care ? ?The results of significant diagnostics from this hospitalization (including imaging, microbiology, ancillary and laboratory) are listed below for reference.  ? ?Significant Diagnostic Studies: ?DG Chest 2  View ? ?Result Date: 04/24/2022 ?CLINICAL DATA:  Cough.  Altered mental status for several days. EXAM: CHEST - 2 VIEW COMPARISON:  03/20/2022 FINDINGS: Stable cardiomegaly. Low lung volumes are seen. Increased infiltrates are seen in both lung bases, which may be due to pneumonia or edema. No evidence of pleural effusion. IMPRESSION: Increased bibasilar infiltrates, which may be due to pneumonia or edema. Stable cardiomegaly. Electronically Signed   By: Marlaine Hind M.D.   On: Apr 24, 2022 16:09  ? ?CT HEAD WO CONTRAST ? ?Result Date: 04-24-2022 ?CLINICAL DATA:  Mental status change. Increased confusion over 3 days. EXAM: CT HEAD WITHOUT CONTRAST TECHNIQUE: Contiguous axial images were obtained from the base of the skull through the vertex without intravenous contrast. RADIATION DOSE REDUCTION: This exam was performed according to the departmental dose-optimization program which includes automated exposure control, adjustment of the mA and/or kV according to patient size and/or use of iterative reconstruction technique. COMPARISON:  03/16/2022 FINDINGS: Brain: No evidence of acute infarction, hemorrhage, hydrocephalus, extra-axial collection or mass lesion/mass effect. There is mild diffuse low-attenuation within the subcortical and periventricular white matter compatible with chronic microvascular disease. Prominence of sulci and ventricles compatible with age related brain atrophy. Vascular: No hyperdense vessel or unexpected calcification. Skull: Normal. Negative for fracture or focal lesion. Sinuses/Orbits: No acute finding. Other: None. IMPRESSION: 1. No acute intracranial abnormalities. 2. Chronic small vessel ischemic disease and brain atrophy. Electronically Signed   By: Kerby Moors M.D.   On: Apr 24, 2022 16:18  ? ?DG CHEST PORT 1 VIEW ? ?Result Date: 04/14/2022 ?CLINICAL DATA:  Respiratory compromise. EXAM: PORTABLE CHEST 1 VIEW COMPARISON:  04/24/2022 FINDINGS: Marked severity patchy areas of airspace  disease  are seen bilaterally. This is most prominent within the bilateral upper lobes. There is Kindrick Lankford very small right pleural effusion. No pneumothorax is identified. The heart size and mediastinal contours are within normal limits. The visualized skeletal structures are unremarkable. IMPRESSION: Marked severity bilateral airspace disease, most prominent within the upper lobes. Electronically Signed   By: Virgina Norfolk M.D.   On: 04/14/2022 21:34  ? ?DG Chest Port 1 View ? ?Result Date: 03/20/2022 ?CLINICAL DATA:  Weakness.  History of hypertension. EXAM: PORTABLE CHEST 1 VIEW COMPARISON:  03/15/2022 and older exams. FINDINGS: Cardiac silhouette is mildly enlarged. No mediastinal or hilar masses. Stable prominent bronchial markings in the lung bases. Lungs otherwise clear. No convincing pleural effusion. No pneumothorax. Skeletal structures are grossly intact. IMPRESSION: No acute cardiopulmonary disease. Electronically Signed   By: Lajean Manes M.D.   On: 03/20/2022 14:59  ? ?DG Chest Port 1V same Day ? ?Result Date: 04/15/2022 ?CLINICAL DATA:  Aspiration pneumonia EXAM: PORTABLE CHEST 1 VIEW COMPARISON:  Radiograph 04/14/2022 FINDINGS: Unchanged cardiomediastinal silhouette. Similar bilateral airspace opacities, severe in the mid to upper lungs and slightly increased in the right lower lung. Small right pleural effusion. No visible pneumothorax. No acute osseous abnormality. IMPRESSION: Persistent severe bilateral airspace disease, most prominent the mid to upper lungs with slight increase in the right lung base. Small right pleural effusion. Electronically Signed   By: Maurine Simmering M.D.   On: 04/15/2022 15:13   ? ?Microbiology: ?Recent Results (from the past 240 hour(s))  ?Culture, blood (Routine X 2) w Reflex to ID Panel     Status: None  ? Collection Time: 04/10/2022  4:40 PM  ? Specimen: BLOOD  ?Result Value Ref Range Status  ? Specimen Description   Final  ?  BLOOD SITE NOT SPECIFIED ?Performed at [facility], [place] [address] [place], [place] [address] ?  ? Special Requests   Final  ?  BOTTLES DRAWN AEROBIC AND ANAEROBIC Blood Culture results may not be optimal due to an excessive volume of blood received in culture bottles ?Performed at [facility], [place] [address] [place], [place] [address] ?  ? Culture   Final  ?  NO GROWTH 5 DAYS ?Performed at [facility], [place] [address], [place],  [address] ?  ? Report Status 04/16/2022 FINAL  Final  ?Resp Panel by RT-PCR (Flu Jayleigh Notarianni&B, Covid) Nasopharyngeal Swab     Status: None  ? Collection Time: 04/10/2022  5:35 PM  ? Specimen: Nasopharyngeal Swab; Nasopharyngeal(NP) swabs in vial transport medium  ?Result Value Ref Range Status  ? SARS Coronavirus 2 by RT PCR NEGATIVE NEGATIVE Final  ?  Comment: (NOTE) ?SARS-CoV-2 target nucleic acids are NOT DETECTED. ? ?The SARS-CoV-2 RNA is generally detectable in upper respiratory ?specimens during the acute phase of infection. The lowest ?concentration of SARS-CoV-2 viral copies this assay can detect is ?138 copies/mL. Breyton Vanscyoc negative result does not preclude SARS-Cov-2 ?infection and should not be used as the sole basis for treatment or ?other patient management decisions. Tamirah George negative result may occur with  ?improper specimen collection/handling, submission of specimen other ?than nasopharyngeal swab, presence of viral mutation(s) within the ?areas targeted by this assay, and inadequate number of viral ?copies(<138 copies/mL). Tijuan Dantes negative result must be combined with ?clinical observations, patient history, and epidemiological ?information. The expected result is Negative. ? ?Fact Sheet for Patients:  ?EntrepreneurPulse.com.au ? ?Fact Sheet for Healthcare Providers:  ?IncredibleEmployment.be ? ?This test is no t yet approved  or cleared by the [place] and  ?has been authorized for detection and/or diagnosis of SARS-CoV-2 by ?FDA under an Emergency Use  Authorization (EUA). This EUA will remain  ?in effect (meaning this test can be used) for the duration of the ?COVID-19 declaration under Section 564(b)(1) of the Act, 21 ?U.S.C.section 360bbb-3(b)(1), unless t

## 2022-04-26 DEATH — deceased
